# Patient Record
Sex: Female | Born: 1960 | Race: Black or African American | Hispanic: No | Marital: Single | Smoking: Never smoker
Health system: Southern US, Community
[De-identification: ages and names within clinical notes are randomized; demographics above are authoritative.]

## PROBLEM LIST (undated history)

## (undated) DIAGNOSIS — I639 Cerebral infarction, unspecified: Secondary | ICD-10-CM

## (undated) DIAGNOSIS — M1990 Unspecified osteoarthritis, unspecified site: Secondary | ICD-10-CM

## (undated) DIAGNOSIS — I10 Essential (primary) hypertension: Secondary | ICD-10-CM

## (undated) DIAGNOSIS — N183 Chronic kidney disease, stage 3 unspecified: Secondary | ICD-10-CM

---

## 1998-04-13 ENCOUNTER — Encounter: Admission: RE | Admit: 1998-04-13 | Discharge: 1998-07-12 | Payer: Self-pay

## 1998-05-24 ENCOUNTER — Encounter: Admission: RE | Admit: 1998-05-24 | Discharge: 1998-08-22 | Payer: Self-pay

## 2004-08-10 ENCOUNTER — Inpatient Hospital Stay (HOSPITAL_COMMUNITY): Admission: AD | Admit: 2004-08-10 | Discharge: 2004-08-14 | Payer: Self-pay | Admitting: Obstetrics & Gynecology

## 2004-08-24 ENCOUNTER — Inpatient Hospital Stay (HOSPITAL_COMMUNITY): Admission: AD | Admit: 2004-08-24 | Discharge: 2004-08-28 | Payer: Self-pay | Admitting: Obstetrics & Gynecology

## 2004-09-05 ENCOUNTER — Encounter (INDEPENDENT_AMBULATORY_CARE_PROVIDER_SITE_OTHER): Payer: Self-pay | Admitting: *Deleted

## 2004-09-05 ENCOUNTER — Inpatient Hospital Stay (HOSPITAL_COMMUNITY): Admission: AD | Admit: 2004-09-05 | Discharge: 2004-09-08 | Payer: Self-pay | Admitting: Obstetrics & Gynecology

## 2004-10-06 ENCOUNTER — Observation Stay (HOSPITAL_COMMUNITY): Admission: AD | Admit: 2004-10-06 | Discharge: 2004-10-07 | Payer: Self-pay | Admitting: Obstetrics & Gynecology

## 2006-12-17 ENCOUNTER — Emergency Department (HOSPITAL_COMMUNITY): Admission: EM | Admit: 2006-12-17 | Discharge: 2006-12-17 | Payer: Self-pay | Admitting: Emergency Medicine

## 2008-03-10 ENCOUNTER — Ambulatory Visit: Payer: Self-pay | Admitting: Cardiology

## 2008-03-10 ENCOUNTER — Inpatient Hospital Stay (HOSPITAL_COMMUNITY): Admission: EM | Admit: 2008-03-10 | Discharge: 2008-03-11 | Payer: Self-pay | Admitting: Emergency Medicine

## 2008-03-15 ENCOUNTER — Ambulatory Visit: Payer: Self-pay

## 2010-10-09 ENCOUNTER — Emergency Department (HOSPITAL_COMMUNITY)
Admission: EM | Admit: 2010-10-09 | Discharge: 2010-10-09 | Payer: Self-pay | Source: Home / Self Care | Admitting: Emergency Medicine

## 2010-10-09 LAB — URINALYSIS, ROUTINE W REFLEX MICROSCOPIC
Protein, ur: NEGATIVE mg/dL
Specific Gravity, Urine: 1.014 (ref 1.005–1.030)
Urine Glucose, Fasting: NEGATIVE mg/dL

## 2010-10-09 LAB — URINE MICROSCOPIC-ADD ON

## 2010-10-09 LAB — WET PREP, GENITAL

## 2010-10-09 LAB — POCT I-STAT, CHEM 8
BUN: 7 mg/dL (ref 6–23)
Chloride: 103 mEq/L (ref 96–112)
Creatinine, Ser: 0.8 mg/dL (ref 0.4–1.2)
Glucose, Bld: 99 mg/dL (ref 70–99)
Hemoglobin: 12.9 g/dL (ref 12.0–15.0)
Potassium: 3.1 mEq/L — ABNORMAL LOW (ref 3.5–5.1)
Sodium: 139 mEq/L (ref 135–145)

## 2010-10-10 LAB — GC/CHLAMYDIA PROBE AMP, GENITAL: GC Probe Amp, Genital: NEGATIVE

## 2011-01-23 NOTE — H&P (Signed)
NAME:  [name], [name]           ACCOUNT NO.:  [id number]   MEDICAL RECORD NO.:  [id number]          PATIENT TYPE:  INP   LOCATION:  1828                         FACILITY:  [facility]   PHYSICIAN:  Della Goo, M.D. DATE OF BIRTH:  07-23-1961   DATE OF ADMISSION:  03/10/2008  DATE OF DISCHARGE:                              HISTORY & PHYSICAL   PRIMARY CARE PHYSICIAN:  Unassigned.   CHIEF COMPLAINT:  Chest pain.   HISTORY OF PRESENT ILLNESS:  This is a 50 year old female presenting to  the emergency department after being seen at the urgent care center for  substernal area chest pain that she reports has been intermittent over  the past 3 days.  She describes the pain as being a tightness, heaviness  in the chest that radiates from the substernal area into the left arm  and into the back.  She reports having shortness of breath and  lightheadedness associated with these episodes.  Each episode lasts  approximately 15 to 20 minutes.  She reports the pain at the worst has  been a 10/10, and the pain actually was unrelieved until she had  administration of one sublingual nitroglycerin tablet, and she was  referred from the urgent care center afterward to the emergency  department for further evaluation.  The patient reports having similar  episodes in the past; however, they have not lasted as long.   PAST MEDICAL HISTORY:  None.   PAST SURGICAL HISTORY:  History of a bilateral tubal ligation, C-section  and a cholecystectomy.   MEDICATIONS AT THIS TIME:  None.   ALLERGIES:  STADOL, WHICH CAUSED HALLUCINATIONS.   SOCIAL HISTORY:  The patient is a nonsmoker, nondrinker, and she denies  any illicit drug usage.   FAMILY HISTORY:  Positive for coronary artery disease and hypertension  in her father, negative for diabetes, and positive for cancer in her  mother, who had lung cancer and was a smoker.   REVIEW OF SYSTEMS:  Pertinents are mentioned above.   PHYSICAL EXAMINATION  FINDINGS:  GENERAL:  This is a 50 year old morbidly  obese female in no visible discomfort or acute distress currently.  VITAL SIGNS:  Temperature 100.0, blood pressure initially 173/103, heart  rate 98, respirations 16, O2 saturation 100%.  HEENT:  Normocephalic, atraumatic.  Pupils equally round, reactive to  light.  Extraocular muscles are intact.  Funduscopic benign.  Oropharynx  is clear.  NECK:  Supple.  Full range of motion.  No thyromegaly, adenopathy,  jugular venous distention.  CARDIOVASCULAR:  Regular rate and rhythm.  No murmurs, gallops or rubs.  LUNGS:  Clear to auscultation bilaterally.  ABDOMEN:  Positive bowel sounds.  Soft, nontender, nondistended.  EXTREMITIES:  Without cyanosis, clubbing or edema.  NEUROLOGIC:  Examination nonfocal, and the patient is alert and  oriented.   LABORATORY STUDIES:  White blood cell count 10.0, hemoglobin 11.1,  hematocrit 33.0, MCV 84.5.  Platelets listed as being within normal  limits, but there is no number listed.  Neutrophils 66%, lymphocytes  25%.  Sodium 139, potassium 3.5, chloride 108, bicarb 23, BUN 9,  creatinine 0.9, glucose 71.  Cardiac  enzymes with a myoglobin of 75.3,  CK-MB 1.5 and troponin 0.05.  Lipase 32 and amylase 98.  Chest x-ray  findings reveal no acute cardiopulmonary disease process.  An EKG  reveals a normal sinus rhythm without acute ST-segment changes.   ASSESSMENT:  A 50 year old female being admitted with:  1. Chest pain.  2. Hypertension/elevated blood pressure.  3. Mild anemia.   PLAN:  The patient will be admitted to a telemetry area for cardiac  monitoring.  Cardiac enzymes will be performed.  The patient will be  placed on nitro paste, oxygen and beta blocker therapy.  Aspirin therapy  has also been ordered.  DVT and GI prophylaxis will be ordered for now.  Further workup will ensue pending the patient's condition and results of  her studies.      Della Goo, M.D.  Electronically  Signed     HJ/MEDQ  D:  03/10/2008  T:  03/10/2008  Job:  [id number]

## 2011-01-23 NOTE — Consult Note (Signed)
NAME:  [name], JACQUELINE           ACCOUNT NO.:  [id number]   MEDICAL RECORD NO.:  [id number]           PATIENT TYPE:   LOCATION:                                 FACILITY:   PHYSICIAN:  Rollene Rotunda, MD, FACCDATE OF BIRTH:  07/18/1961   DATE OF CONSULTATION:  03/11/2008  DATE OF DISCHARGE:                                 CONSULTATION   PRIMARY CARE PHYSICIAN:  Patrica Duel, MD   NEW PRIMARY CARDIOLOGIST:  Rollene Rotunda, MD, [facility]   CHIEF COMPLAINT:  Chest pain.   HISTORY OF PRESENT ILLNESS:  Ms. Guirguis is a 50 year old female with no  previous history of coronary artery disease.  She has a several-month  history of fatigue and dyspnea on exertion, but no chest pain until 3  days ago.  She describes the chest pain as sharp and states it goes from  the left side of her chest down her left arm and around to her back.  She has had occasional right-sided pain also.  She has had 3-4 episodes  daily for the last 3 days, each lasting about 15 minutes.  There is no  aggravating or alleviating factors.  There is no clear association with  exertion, rest, or supine position.  She has also had bilateral hand  cramping and some numbness as well as cramping in her big toes  bilaterally and right arm.  The lower extremity cramps are relieved by  walking.  Yesterday, she had more frequent episodes and the intensity  reached to 9/10.  She also complained of a headache.  She went to the  emergency room, where her blood pressure was 178/108.  She was admitted.  Cardiac enzymes were cycled and are negative so far.  Her EKG was not  acute.  Cardiology was asked to evaluate her.  In the emergency room,  she received sublingual nitroglycerin, which helped both her blood  pressure and her chest pain.  She has had no chest pain today, but feels  sluggish, which is the way she has felt for the last few months.  She is  otherwise resting comfortably.   PAST MEDICAL HISTORY:  1. She denies any history  of diabetes, hypertension, hyperlipidemia,      or tobacco abuse.  2. Family history of coronary artery disease.  3. Pregnancy-induced hypertension in 2005.  4. Possible osteoarthritis.  5. Gestational diabetes.  6. Gestational thrombocytopenia.   SURGICAL HISTORY:  She is status post C-section x2 as well as  cholecystectomy and bilateral tubal ligation.   ALLERGIES:  She is intolerant or allergic to STADOL with a rash.   MEDICATIONS PRIOR TO ADMISSION:  None.   CURRENT MEDICATIONS:  1. Aspirin 325 mg daily.  2. DVT Lovenox.  3. Metoprolol 12.5 mg b.i.d.  4. Nitroglycerin paste 1/4 inch q.6 h.  5. Protonix 40 mg daily.  6. Potassium 20 mEq x4.  7. Senokot nightly.   SOCIAL HISTORY:  She lives in [place] and her son lives with her.  She works at the [employer].  She has no history of alcohol,  tobacco, or drug abuse.   FAMILY  HISTORY:  Her mother died at age 109 of cancer with no heart  disease, and her father died at age 55 with a history of coronary artery  disease and heart failure, but no siblings have heart disease.   REVIEW OF SYSTEMS:  She feels that her weight has increased about 15  pounds in the last 6 months.  The chest pain is described above.  She  had shortness of breath with it and also complains of increased dyspnea  on exertion.  There has been no coughing or wheezing.  There has been no  edema, no palpitations, no presyncope, or syncope.  She has had p.m. leg  cramps, and because of this, sleeps poorly.  She has had daytime  fatigue.  She has arthralgias mainly in her knees.  She has had  increased reflux symptoms recently.  Full 14-point review of systems is  otherwise negative.   PHYSICAL EXAMINATION:  VITAL SIGNS:  Temperature is 99.7, blood pressure  141/74, pulse 90, respiratory rate 18, and O2 saturation 100% on room  air.  GENERAL:  Well-developed, well-nourished, African American female,  in no acute distress.  HEENT:  Normal.  NECK:   There is no lymphadenopathy, thyromegaly, bruit, or JVD noted.  CV:  Heart is regular in rate and rhythm with an S1and S2 and a soft  systolic murmur is fairly noticeable.  Distal pulses are intact in all 4  extremities.  LUNGS:  Clear to auscultation bilaterally with no wheezing or crackles.  SKIN:  No rashes or lesions are noted.  ABDOMEN:  Soft and nontender with active bowel sounds and no  hepatosplenomegaly by palpation.  EXTREMITIES:  There is no edema noted and no cyanosis or clubbing is  noted.  MUSCULOSKELETAL:  No joint deformity or effusions and no spine or CVA  tenderness.  NEURO:  Intact, as she is alert and oriented.  Cranial nerves II through  XII grossly intact.   Chest x-ray, no acute disease.   EKG, sinus rhythm, rate 92, with no acute changes and repeat EKG is  within normal limits.   LABORATORY VALUES:  Hemoglobin 11.2, hematocrit 33, WBCs 10.0, platelets  are clumped, but count appears adequate.  Sodium 139, potassium 3.5,  chloride 108, BUN 9, creatinine 0.9, and glucose 71.  TSH 1.285.  CK-MB  and troponin I negative for MI.  Amylase and lipase within normal  limits.  Point-of-care markers also negative.  Total cholesterol 118,  triglycerides 51, HDL 51, and LDL 57.   IMPRESSION:  Ms. Shugrue was seen today by Dr. Antoine Poche.  Her chest pain  is atypical, but also has some typical features.  There is no objective  evidence of ischemia or myocardial infarction.  Because her chest pain  is largely  atypical, the plan will be for an outpatient cardiac stress test.  She  is scheduled for an exercise Myoview in our office on Monday.  It is  appropriate to continue her on a low-dose beta blocker, which we need to  hold on the day of the stress test, and consideration can be given to a  low-dose diuretic, if more optimal blood pressure control is needed.      Theodore Demark, PA-C      Rollene Rotunda, MD, [facility]  Electronically Signed    RB/MEDQ  D:   03/11/2008  T:  03/12/2008  Job:  [id number]

## 2011-01-23 NOTE — Discharge Summary (Signed)
NAME:  [name], [name]           ACCOUNT NO.:  [id number]   MEDICAL RECORD NO.:  [id number]          PATIENT TYPE:  INP   LOCATION:  3733                         FACILITY:  [facility]   PHYSICIAN:  Elliot Cousin, M.D.    DATE OF BIRTH:  September 26, 1960   DATE OF ADMISSION:  03/10/2008  DATE OF DISCHARGE:  03/11/2008                               DISCHARGE SUMMARY   DISCHARGE DIAGNOSES:  1. Chest pain, myocardial infarction ruled out.  2. Hypertension.   DISCHARGE MEDICATIONS:  1. Toprol XL 25 mg daily.  2. Aspirin 81 mg daily.   DISCHARGE DISPOSITION:  The patient is being discharged to home in  improved and stable condition.  She was advised to follow up with  [facility] cardiology on Monday, March 15, 2008 at 8:45 a.m. for a treadmill  stress test.  She will follow up with Dr. Antoine Poche on April 12, 2008 at  3:45 p.m.   CONSULTATIONS:  Rollene Rotunda, MD.   PROCEDURE PERFORMED:  Chest x-ray.  The results revealed no acute  cardiopulmonary disease.   HISTORY OF PRESENT ILLNESS:  The patient is a 50 year old woman with no  significant past medical history who presented to the emergency  department on March 10, 2008 with a chief complaint of substernal chest  pain that radiated to the left arm and with associated shortness of  breath and lightheadedness. When she was evaluated in the emergency  department, she was given 1 sublingual nitroglycerin, which apparently  relieved her pain. Her blood pressure was elevated at 173/103.  Her  heart rate was 98.  Her EKG revealed normal sinus rhythm with a heart  rate of 92 beats per minute and no acute abnormalities.  The patient was  admitted for further evaluation and management.   For additional details, please see the dictated history and physical.   HOSPITAL COURSE:  CHEST PAIN AND HYPERTENSION.  The patient was started  on Nitropaste empirically at 1 inch every 6 hours.  For blood pressure  control, intravenous Lopressor was added IV at 5 mg  every 6 hours  (p.r.n.).  Aspirin therapy was started at 325 mg daily.  Dilaudid was  added symptomatically for pain as needed.  For further evaluation,  cardiac enzymes, TSH, and a fasting lipid profile were ordered.  The  patient's cardiac enzymes were completely normal.  Her fasting lipid  profile revealed a total cholesterol of 118, triglycerides of 51, HDL  cholesterol of 51, and LDL cholesterol of 57.  Her TSH was within normal  limits at 1.285.   Over the course of the 24-hour period, the patient's chest pain  completely resolved.  A follow-up EKG revealed normal sinus rhythm with  nonspecific T-wave abnormalities and a heart rate of 82 beats per  minute.  Subsequently, Lopressor was started orally at 12.5 mg b.i.d.  The Nitropaste was tapered off.  Cardiologist Dr. Rollene Rotunda was  consulted for further risk stratification and evaluation.  Per his  assessment, the patient could be evaluated further in the outpatient  setting given that she was now chest pain free and that her cardiac  enzymes were  completely normal.  Therefore, the patient has been set up  for an outpatient stress test on Monday March 15, 2008.  Prior to hospital  discharge, the patient was given instructions on the time and place of  the stress test.  Upon discharge, the Lopressor  was discontinued and the patient was subsequently started on Toprol XL  25 mg daily.  The patient was advised to continue aspirin at 81 mg  daily.  She is to follow up with Dr. Antoine Poche on April 12, 2008.  The  patient voiced understanding.      Elliot Cousin, M.D.  Electronically Signed     DF/MEDQ  D:  03/11/2008  T:  03/11/2008  Job:  [id number]   cc:   Rollene Rotunda, MD, [facility]

## 2011-01-26 NOTE — Discharge Summary (Signed)
NAME:  [name], [name]           ACCOUNT NO.:  [id number]   MEDICAL RECORD NO.:  [id number]          PATIENT TYPE:  INP   LOCATION:  9119                          [facility]  [facility]   PHYSICIAN:  Charles A. Clearance Coots, M.D.DATE OF BIRTH:  1961-04-02   DATE OF ADMISSION:  09/05/2004  DATE OF DISCHARGE:                                 DISCHARGE SUMMARY   ADMITTING DIAGNOSES:  1.  Thirty-seven weeks gestation.  2.  Early labor.  3.  History of previous cesarean section.  4.  Declines trial of labor.  5.  Desires permanent sterilization.  6.  History of pregnancy-induced hypertension.  7.  History of gestational thrombocytopenia.  Discharge Diagnosis  1.  Status post repeat low transverse cesarean section and bilateral partial      salpingectomy on September 05, 2004.  Delivered a viable female by cesarean      section at 42; Apgars of 9 at one minute and 9 at five minutes; weight      of 3150 g, length of 48.5 cm.  Mother and infant discharged home in good      condition.   REASON FOR ADMISSION:  A 50 year old black female G3 P2-0-0-2; estimated  date of confinement of September 24, 2004; presented at [redacted] weeks gestation in  early labor.  The patient has a history of previous cesarean section and had  refused a trial of labor.  She also desired tubal sterilization.  Her  obstetrical history was significant for pregnancy-induced hypertension for  which she was admitted and placed on bedrest during this pregnancy.  She  also has a history of gestational thrombocytopenia which was stable.  She  had a recent steroid taper for her gestational thrombocytopenia.   PAST MEDICAL HISTORY:  Surgery:  Cholecystectomy.  Illnesses:  Arthritis.   MEDICATIONS:  Prenatal vitamins, steroids.   ALLERGIES:  No known drug allergies.   SOCIAL HISTORY:  Negative for tobacco, alcohol, or recreational drug use.   PHYSICAL EXAMINATION:  GENERAL:  Well-nourished, well-developed black female  in no acute  distress.  VITAL SIGNS:  Temperature 98.5, pulse 77, respiratory rate 18, blood  pressure 135/77.  LUNGS:  Clear to auscultation bilaterally.  HEART:  Regular rate and rhythm.  ABDOMEN:  Gravid, nontender.  PELVIC:  Cervix 1 cm dilated per R.N. exam.   ADMITTING LABORATORY VALUES:  Hemoglobin 12.3; hematocrit 35.5; white blood  cell count 11,300; platelets 91,000.  RPR was nonreactive.   HOSPITAL COURSE:  The patient underwent a repeat low transverse cesarean  section and bilateral partial salpingectomy on September 05, 2004.  There  were no intraoperative complications.  Postoperative course was  uncomplicated and the patient discharged home on postoperative day #3 in  good condition.   DISCHARGE LABORATORY VALUES:  Hemoglobin 10.5; hematocrit 30.3; white blood  cell count 11,600; platelets 76,000.   DISCHARGE DISPOSITION:  1.  Medications:  Percocet was prescribed for pain.  2.  The patient is to not take any ibuprofen products because of her low      platelet count.  3.  Routine written instructions were given for obstetrical discharge after  cesarean section.  4.  The patient is to follow up in the office in 4 days for removal of her      staples.     Char   CAH/MEDQ  D:  09/08/2004  T:  09/08/2004  Job:  [id number]

## 2011-01-26 NOTE — Discharge Summary (Signed)
NAME:  [name], [name]           ACCOUNT NO.:  [id number]   MEDICAL RECORD NO.:  [id number]          PATIENT TYPE:  INP   LOCATION:  9156                          FACILITY:  [facility]   PHYSICIAN:  Roseanna Rainbow, M.D.DATE OF BIRTH:  1960/11/20   DATE OF ADMISSION:  08/24/2004  DATE OF DISCHARGE:  08/28/2004                                 DISCHARGE SUMMARY   CHIEF COMPLAINT:  The patient is a 50 year old para 2 with estimated date of  confinement September 26, 2004 with an intrauterine pregnancy of 35 weeks 2  days with pregnancy induced hypertension.  Please see the dictated history  and physical for further details.   HOSPITAL COURSE:  The patient was admitted.  Her blood pressures remained  stable.  A 24 hour urine demonstrated 215 mg of proteinuria and a creatinine  clearance of 195.  The fetal heart tracings remained reassuring and she was  discharged to home on hospital day #4.   DISCHARGE DIAGNOSIS:  Pregnancy induced hypertension, mild.  Intrauterine  pregnancy at 35+ weeks.   CONDITION ON DISCHARGE:  Stable.   DIET:  Regular.   ACTIVITY:  Modified bed rest.   MEDICATIONS:  Resume home medications.   DISPOSITION:  The patient was to follow up in the office in several days.      LAJ/MEDQ  D:  10/02/2004  T:  10/02/2004  Job:  [id number]

## 2011-01-26 NOTE — Discharge Summary (Signed)
NAME:  [name], [name]           ACCOUNT NO.:  [id number]   MEDICAL RECORD NO.:  [id number]          PATIENT TYPE:  INP   LOCATION:  9151                          [facility]   PHYSICIAN:  Roseanna Rainbow, M.D.DATE OF BIRTH:  03/08/1961   DATE OF ADMISSION:  08/10/2004  DATE OF DISCHARGE:  08/14/2004                                 DISCHARGE SUMMARY   CHIEF COMPLAINT:  The patient is a 50 year old para 1 with estimated date of  confinement of September 26, 2004 with an intrauterine pregnancy at 34 weeks  with elevated blood pressures and thrombocytopenia.  Please see the dictated  History and Physical for further details.   HOSPITAL COURSE:  The patient was admitted.  Her blood pressures remained  stable.  A 24-hour urine and laboratory work was not consistent with severe  pregnancy-induced hypertension or HELLP syndrome.  The fetal heart tracing  remained reassuring.  She was discharged to home on hospital day #4.   DISCHARGE DIAGNOSES:  1.  Intrauterine pregnancy at 34+ weeks.  2.  Mild pregnancy-induced hypertension.  3.  Gestational thrombocytopenia.   CONDITION:  Stable.   DIET:  Regular.   ACTIVITY:  Modified bed rest.   MEDICATIONS:  Resume home medications.   DISPOSITION:  The patient was to follow up in the office in several days.      LAJ/MEDQ  D:  10/02/2004  T:  10/02/2004  Job:  [id number]

## 2011-01-26 NOTE — H&P (Signed)
NAME:  [name], [name]           ACCOUNT NO.:  [id number]   MEDICAL RECORD NO.:  [id number]          PATIENT TYPE:  INP   LOCATION:  9156                          [facility]   PHYSICIAN:  Roseanna Rainbow, M.D.DATE OF BIRTH:  1961/03/27   DATE OF ADMISSION:  DATE OF DISCHARGE:                                HISTORY & PHYSICAL   CHIEF COMPLAINT:  The patient is a 50 year old, para 2, with an estimated  date of confinement of September 26, 2004, with an intrauterine pregnancy at  35 weeks, 2 days, with pregnancy-induced hypertension, rule out severe  pregnancy-induced hypertension.   HISTORY OF PRESENT ILLNESS:  The patient has been followed at [facility]  for gestational thrombocytopenia.  She has recently completed a steroid  taper.  Recent platelet count was 121,000 with large platelets present.  Blood pressure in the office today was 159/87.  She denies any concomitant  symptoms.  Urine dip was negative for protein.   ANTEPARTUM COURSE PROBLEMS:  (See above.)  1.  Advanced maternal age.  2.  History of a previous cesarean delivery.  3.  History of gestational diabetes and a macrosomic infant.  4.  Pregnancy-induced hypertension.   PRENATAL SCREEN:  Sickle cell negative.  Hepatitis B surface antigen  negative.  HIV nonreactive.  RPR nonreactive.  Rubella immune.  AVO and Rh,  A positive, antibody screen negative.  Hemoglobin 11.1, hematocrit 34.6,  platelet count 112,000.  Urine culture and sensitivity revealed no urine  pathogens.  One-hour GCT 81.   PAST OBSTETRICAL HISTORY:  1.  In 1984, she was delivered of a female, 6 pounds, 5 ounces, at 41 weeks,      18 hours of labor, spontaneous vaginal delivery, no complications.  2.  In March of 1986, she was delivered of a female, 9 pounds, 8 ounces, at      41 weeks, 20 hours of labor, by cesarean delivery, breech presentation.      Also complicated by gestational diabetes, diet controlled, and pregnancy-  induced hypertension at term.   PAST GYNECOLOGIC HISTORY:  Noncontributory.   PAST MEDICAL HISTORY:  Arthritis.   PAST SURGICAL HISTORY:  1.  See above.  2.  She is also status post cholecystectomy.   FAMILY HISTORY:  Remarkable for heart disease, chronic hypertension, COPD,  and breast cancer.   SOCIAL HISTORY:  She denies any tobacco, ethanol, or substance abuse.   ALLERGIES:  No known drug allergies.   MEDICATIONS:  Prenatal vitamins.   PHYSICAL EXAMINATION:  VITAL SIGNS:  Temperature 98.2, pulse 93, blood  pressure 159/87.  GENERAL:  Well-developed, well-nourished, in no apparent distress.  LUNGS:  Clear to auscultation bilaterally.  ABDOMEN:  Gravid.  PELVIC EXAM:  Deferred.  EXTREMITIES:  Trace to 1+ lower extremity edema.   Non-stress test reactive.   ASSESSMENT:  1.  Intrauterine pregnancy at 62 and 2/7ths weeks with pregnancy-induced      hypertension, rule out severe.  2.  History of gestational thrombocytopenia.   PLAN:  1.  Admission.  2.  Daily weights.  3.  Serial laboratory testing.  4.  Repeat 24-hour  urine for protein and creatinine.  5.  Daily non-stress tests.     [name]  D:  08/24/2004  T:  08/24/2004  Job:  [id number]

## 2011-01-26 NOTE — H&P (Signed)
NAME:  [name], [name]           ACCOUNT NO.:  [id number]   MEDICAL RECORD NO.:  [id number]          PATIENT TYPE:  INP   LOCATION:  9151                          FACILITY:  [facility]   PHYSICIAN:  Roseanna Rainbow, M.D.DATE OF BIRTH:  1960-09-20   DATE OF ADMISSION:  08/10/2004  DATE OF DISCHARGE:                                HISTORY & PHYSICAL   CHIEF COMPLAINT:  The patient is a 50 year old para 2 with an estimated date  of confinement of September 26, 2004 with an intrauterine pregnancy at 34  weeks.  She had elevated blood pressures and thrombocytopenia.   HISTORY OF PRESENT ILLNESS:  See above.  The patient has been followed at  [facility] for gestational thrombocytopenia.  She has recently been  given a steroid taper.  Blood pressure in the office today was 156/79.  Urine dip was negative for protein.  The patient denied any concomitant  symptoms.   ANTEPARTUM COURSE:  Problems with see above.  1.  Advanced maternal age.  2.  History of a previous cesarean delivery.  3.  History of gestational diabetes and macrosomic infant.  4.  Pregnancy-induced hypertension.   PRENATAL SCREENS:  Sickle cell negative.  Hepatitis B surface-antigen  negative.  HIV nonreactive.  RPR nonreactive.  Rubella immuned.  ABO and Rh,  a positive, antibody screen negative.  Hemoglobin 11.1, hematocrit 34.6,  platelet count 112,000.  Most recent platelet count on July 27, 2004 was  97,000 with large platelets present.  Urine culture and sensitivity show no  uropathogens.  Recent ultrasound several days prior to presentation with the  report pending.  One hour GTC 81.   OBSTETRICAL HISTORY:  1.  In 1984, she has delivered a female 6 pound 5 ounces, 41 weeks, 18 hours      of labor.  Spontaneous vaginal delivery.  No complications.  2.  In March of 1986, she has delivered a female 9 pound 8 ounces, 41 weeks,      20 hours in labor by cesarean section, breech presentation also  complicated by gestational diabetes, diet controlled.  Complicated as      well by pregnancy-induced hypertension at term.   PAST MEDICAL HISTORY:  Arthritis.   PAST SURGICAL HISTORY:  See above.  Cholecystectomy.   FAMILY HISTORY:  Remarkable for heart disease, chronic hypertension, COPD,  breast cancer.   SOCIAL HISTORY:  She denies any tobacco, alcohol, or substance abuse.   ALLERGIES:  No known drug allergies.   MEDICATIONS:  Prenatal vitamins, prednisone.   PHYSICAL EXAMINATION:  VITAL SIGNS:  Temperature 98.3, pulse 81, blood  pressure 156/79.  GENERAL:  No apparent distress.  ABDOMEN:  Gravid.  Nontender.  PELVIC:  Deferred.  EXTREMITIES:  No edema.   ASSESSMENT:  1.  Intrauterine pregnancy at 34 weeks.  Rule out pregnancy-induced      hypertension.  2.  History of gestational thrombocytopenia.   PLAN:  Admission.  Daily weights.  Serial laboratory evaluations.  A 24-hour  urine for protein and creatinine.  Heightened fetal surveillance.     [name]  D:  08/10/2004  T:  08/10/2004  Job:  [id number]

## 2011-01-26 NOTE — Op Note (Signed)
NAME:  Portlock, JACQUELINE           ACCOUNT NO.:  [id number]   MEDICAL RECORD NO.:  [id number]          PATIENT TYPE:  MAT   LOCATION:  MATC                          FACILITY:  [facility]   PHYSICIAN:  Roseanna Rainbow, M.D.DATE OF BIRTH:  11/03/1960   DATE OF PROCEDURE:  09/05/2004  DATE OF DISCHARGE:                                 OPERATIVE REPORT   PREOPERATIVE DIAGNOSES:  1.  Intrauterine pregnancy at 36 weeks, in early labor.  2.  History of a previous cesarean delivery, declines trial of labor.  3.  Desires a sterilization procedure.   POSTOPERATIVE DIAGNOSES:  1.  Intrauterine pregnancy at 36 weeks, in early labor.  2.  History of a previous cesarean delivery, declines trial of labor.  3.  Desires a sterilization procedure.   PROCEDURES:  1.  Repeat cesarean delivery.  2.  Modified Pomeroy bilateral tubal ligation.   SURGEON:  Roseanna Rainbow, M.D.   ANESTHESIA:  Spinal.   ESTIMATED BLOOD LOSS:  800 mL.   COMPLICATIONS:  None.   PROCEDURE:  The patient was taken to the OR.  A spinal anesthetic was  placed.  She was then placed in the dorsal supine position with a leftward  tilt.  She was prepped and draped in the usual sterile fashion.  The  previous midline scar was then excised with a scalpel and carried down to  the underlying fascia.  The fascia was incised along the length of the  incision.  The parietal peritoneum was tented up and entered sharply.  The  incision was then extended superiorly and inferiorly with good visualization  of the bladder.  The bladder blade was placed.  The vesicouterine peritoneum  was tented up and entered sharply.  This incision was then extended  bilaterally and the bladder flap created sharply.  The bladder blade was  replaced.  The lower uterine segment was incised in a transverse fashion  with a scalpel.  This incision was extended bilaterally with bandage  scissors.  The infant's head was delivered atraumatically.  The  oropharynx  was suctioned with the bulb suction.  The cord was clamped and cut.  The  infant was handed off to the awaiting neonatologists.  Apgars were 9 at one  and five minutes, respectively.  The placenta was removed.  The intrauterine  cavity was evacuated of any remaining amniotic fluid, clots and debris with  a moistened laparotomy sponge.  The uterine incision was then reapproximated  in a running interlocking fashion in layers using 0 Monocryl.  Adequate  hemostasis was noted.  The midisthmic portion of the left fallopian tube was  then grasped.  Two ligatures of 0 plain were then placed and the segment of  tube excised.  The right fallopian tube was manipulated in a similar  fashion.  The paracolic gutters were copiously irrigated.  The parietal  peritoneum and  fascia were reapproximated as a single continuous running layer using 0 PDS.  The skin was reapproximated with staples.  At the close of the procedure,  the instrument and pad counts were said to be correct x2.  Cefazolin 1  g was  given at cord clamp.  The patient was taken to the PACU awake and in stable  condition.     [name]  D:  09/05/2004  T:  09/05/2004  Job:  [id number]

## 2011-06-07 LAB — POCT I-STAT, CHEM 8
Calcium, Ion: 0.99 — ABNORMAL LOW
Creatinine, Ser: 0.9
Glucose, Bld: 71
Hemoglobin: 11.2 — ABNORMAL LOW
TCO2: 23

## 2011-06-07 LAB — LIPID PANEL
Cholesterol: 118
HDL: 51
LDL Cholesterol: 57
Total CHOL/HDL Ratio: 2.3

## 2011-06-07 LAB — DIFFERENTIAL
Basophils Absolute: 0
Lymphocytes Relative: 25
Monocytes Relative: 8

## 2011-06-07 LAB — CBC
Hemoglobin: 11.1 — ABNORMAL LOW
Platelets: ADEQUATE
RDW: 15.8 — ABNORMAL HIGH
WBC: 10

## 2011-06-07 LAB — AMYLASE: Amylase: 98

## 2011-06-07 LAB — POCT CARDIAC MARKERS
Myoglobin, poc: 75.3
Operator id: 272551

## 2011-06-07 LAB — BASIC METABOLIC PANEL
BUN: 11
GFR calc non Af Amer: >60
Glucose, Bld: 85
Potassium: 3.5

## 2011-06-07 LAB — LIPASE, BLOOD: Lipase: 32

## 2011-06-07 LAB — CARDIAC PANEL(CRET KIN+CKTOT+MB+TROPI): Relative Index: 0.7

## 2013-09-25 ENCOUNTER — Emergency Department (HOSPITAL_COMMUNITY)
Admission: EM | Admit: 2013-09-25 | Discharge: 2013-09-25 | Disposition: A | Discharge status: Home / Self Care | Payer: 59 | Source: Home / Self Care | Attending: Family Medicine | Admitting: Family Medicine

## 2013-09-25 ENCOUNTER — Encounter (HOSPITAL_COMMUNITY): Payer: Self-pay | Admitting: Emergency Medicine

## 2013-09-25 DIAGNOSIS — J069 Acute upper respiratory infection, unspecified: Secondary | ICD-10-CM

## 2013-09-25 LAB — POCT RAPID STREP A: STREPTOCOCCUS, GROUP A SCREEN (DIRECT): NEGATIVE

## 2013-09-25 LAB — POCT I-STAT, CHEM 8
BUN: 6 mg/dL (ref 6–23)
CALCIUM ION: 1.15 mmol/L (ref 1.12–1.23)
CREATININE: 0.8 mg/dL (ref 0.50–1.10)
Chloride: 101 mEq/L (ref 96–112)
Glucose, Bld: 100 mg/dL — ABNORMAL HIGH (ref 70–99)
HCT: 35 % — ABNORMAL LOW (ref 36.0–46.0)
HEMOGLOBIN: 11.9 g/dL — AB (ref 12.0–15.0)
Potassium: 3.5 mEq/L — ABNORMAL LOW (ref 3.7–5.3)
SODIUM: 139 meq/L (ref 137–147)
TCO2: 27 mmol/L (ref 0–100)

## 2013-09-25 MED ORDER — PREDNISONE 10 MG PO TABS: 30.0000 mg | ORAL_TABLET | Freq: Every day | ORAL | Status: DC

## 2013-09-25 MED ORDER — LISINOPRIL 10 MG PO TABS: 10.0000 mg | ORAL_TABLET | Freq: Every day | ORAL | Status: DC

## 2013-09-25 MED ORDER — HYDROCODONE-ACETAMINOPHEN 5-325 MG PO TABS: 0.5000 | ORAL_TABLET | Freq: Every evening | ORAL | Status: DC | PRN

## 2013-09-25 MED ORDER — IPRATROPIUM BROMIDE 0.06 % NA SOLN: 2.0000 | Freq: Four times a day (QID) | NASAL | Status: DC

## 2013-09-25 NOTE — Discharge Instructions (Signed)
Thank you for coming in today. STOP: Taking cough and cold medications ibuprofen or Aleve. These are increasing your blood pressure. Start lisinopril for blood pressure control.  Take prednisone daily for 5 days. Use Atrovent nasal spray. Use hydrocodone containing cough medication pills as needed. Use Tylenol for pain control as needed. Call or go to the emergency room if you get worse, have trouble breathing, have chest pains, or palpitations.  Followup with a primary care provider or come back here in 1 week for blood pressure recheck,

## 2013-09-25 NOTE — ED Provider Notes (Signed)
[name] is a 53 y.o. female who presents to Urgent Care today for cough congestion sore throat hoarse voice and nasal discharge. Symptoms are present for about 5 days now. Patient denies any chest pains palpitations trouble breathing significant headache weakness difficulty with vision or syncope. She's tried multiple over-the-counter medications including NSAIDs and Sudafed-type medications recently. These have helped a little. Patient has a past medical history for mild hypertension currently not taking any medication. She has never had blood pressure this high before.   History reviewed. No pertinent past medical history. History  Substance Use Topics  . Smoking status: Not on file  . Smokeless tobacco: Not on file  . Alcohol Use: No   ROS as above Medications: No current facility-administered medications for this encounter.   Current Outpatient Prescriptions  Medication Sig Dispense Refill  . HYDROcodone-acetaminophen (NORCO/VICODIN) 5-325 MG per tablet Take 0.5 tablets by mouth at bedtime as needed (cough).  10 tablet  0  . ipratropium (ATROVENT) 0.06 % nasal spray Place 2 sprays into both nostrils 4 (four) times daily.  15 mL  1  . lisinopril (PRINIVIL,ZESTRIL) 10 MG tablet Take 1 tablet (10 mg total) by mouth daily.  30 tablet  0  . predniSONE (DELTASONE) 10 MG tablet Take 3 tablets (30 mg total) by mouth daily.  15 tablet  0    Exam:  BP 232/112  Pulse 89  Temp(Src) 98.4 F (36.9 C) (Oral)  Resp 20  SpO2 100%  LMP 09/15/2013 Gen: Well NAD HEENT: EOMI,  MMM posterior pharynx with cobblestoning. Tympanic membranes are normal appearing bilaterally. Tender bilateral anterior cervical lymphadenopathy right worse than left. Lungs: Normal work of breathing. CTABL Heart: RRR no MRG Abd: NABS, Soft. NT, ND Exts: Brisk capillary refill, warm and well perfused.   Results for orders placed during the hospital encounter of 09/25/13 (from the past 24 hour(s))  POCT RAPID  STREP A ([place])     Status: None   Collection Time    09/25/13 11:31 AM      Result Value Range   Streptococcus, Group A Screen (Direct) NEGATIVE  NEGATIVE  POCT I-STAT, CHEM 8     Status: Abnormal   Collection Time    09/25/13 11:34 AM      Result Value Range   Sodium 139  137 - 147 mEq/L   Potassium 3.5 (*) 3.7 - 5.3 mEq/L   Chloride 101  96 - 112 mEq/L   BUN 6  6 - 23 mg/dL   Creatinine, Ser 0.80  0.50 - 1.10 mg/dL   Glucose, Bld 100 (*) 70 - 99 mg/dL   Calcium, Ion 1.15  1.12 - 1.23 mmol/L   TCO2 27  0 - 100 mmol/L   Hemoglobin 11.9 (*) 12.0 - 15.0 g/dL   HCT 35.0 (*) 36.0 - 46.0 %   No results found.  Assessment and Plan: 53 y.o. female with  1) viral pharyngitis/URI. Plan to treat with low-dose prednisone, Atrovent nasal spray, and hydrocodone his cough medication. Followup with primary care provider 2) elevated blood pressure: Hypertensive range. Patient currently is symptomatic. Will start lisinopril and return in one week for blood pressure recheck. Recommend followup with primary care provider.  Discussed warning signs or symptoms. Please see discharge instructions. Patient expresses understanding.    Gregor Hams, MD 09/25/13 [phone]

## 2013-09-25 NOTE — ED Notes (Signed)
C/o sore throat due to cough with mucous, headache, and congestion. Stated that she has taking OTC medications with no relief. B/p is 232/112. Denies chest pain or any other symptoms. Written by: Lenore Manner, SMA

## 2013-09-27 LAB — CULTURE, GROUP A STREP

## 2015-09-19 ENCOUNTER — Encounter (HOSPITAL_COMMUNITY): Payer: Self-pay | Admitting: Family Medicine

## 2015-09-19 ENCOUNTER — Observation Stay (HOSPITAL_COMMUNITY)
Admission: EM | Admit: 2015-09-19 | Discharge: 2015-09-20 | Disposition: A | Discharge status: Home / Self Care | Payer: 59 | Attending: Internal Medicine | Admitting: Internal Medicine

## 2015-09-19 ENCOUNTER — Emergency Department (HOSPITAL_COMMUNITY): Payer: 59

## 2015-09-19 DIAGNOSIS — I161 Hypertensive emergency: Principal | ICD-10-CM | POA: Insufficient documentation

## 2015-09-19 DIAGNOSIS — E876 Hypokalemia: Secondary | ICD-10-CM | POA: Diagnosis not present

## 2015-09-19 DIAGNOSIS — R7303 Prediabetes: Secondary | ICD-10-CM | POA: Insufficient documentation

## 2015-09-19 DIAGNOSIS — Z9114 Patient's other noncompliance with medication regimen: Secondary | ICD-10-CM | POA: Diagnosis not present

## 2015-09-19 DIAGNOSIS — R209 Unspecified disturbances of skin sensation: Secondary | ICD-10-CM | POA: Insufficient documentation

## 2015-09-19 DIAGNOSIS — E875 Hyperkalemia: Secondary | ICD-10-CM | POA: Diagnosis not present

## 2015-09-19 DIAGNOSIS — R0789 Other chest pain: Secondary | ICD-10-CM | POA: Diagnosis not present

## 2015-09-19 DIAGNOSIS — N179 Acute kidney failure, unspecified: Secondary | ICD-10-CM | POA: Insufficient documentation

## 2015-09-19 DIAGNOSIS — M170 Bilateral primary osteoarthritis of knee: Secondary | ICD-10-CM | POA: Insufficient documentation

## 2015-09-19 DIAGNOSIS — I517 Cardiomegaly: Secondary | ICD-10-CM | POA: Insufficient documentation

## 2015-09-19 DIAGNOSIS — I10 Essential (primary) hypertension: Secondary | ICD-10-CM | POA: Diagnosis not present

## 2015-09-19 DIAGNOSIS — M25569 Pain in unspecified knee: Secondary | ICD-10-CM

## 2015-09-19 DIAGNOSIS — R52 Pain, unspecified: Secondary | ICD-10-CM

## 2015-09-19 DIAGNOSIS — D509 Iron deficiency anemia, unspecified: Secondary | ICD-10-CM | POA: Insufficient documentation

## 2015-09-19 DIAGNOSIS — M069 Rheumatoid arthritis, unspecified: Secondary | ICD-10-CM | POA: Diagnosis not present

## 2015-09-19 HISTORY — DX: Essential (primary) hypertension: I10

## 2015-09-19 HISTORY — DX: Unspecified osteoarthritis, unspecified site: M19.90

## 2015-09-19 LAB — CBC
HCT: 36.1 % (ref 36.0–46.0)
HEMOGLOBIN: 11.5 g/dL — AB (ref 12.0–15.0)
MCH: 26 pg (ref 26.0–34.0)
MCHC: 31.9 g/dL (ref 30.0–36.0)
MCV: 81.7 fL (ref 78.0–100.0)
Platelets: 153 10*3/uL (ref 150–400)
RBC: 4.42 MIL/uL (ref 3.87–5.11)
RDW: 17 % — ABNORMAL HIGH (ref 11.5–15.5)
WBC: 9.9 10*3/uL (ref 4.0–10.5)

## 2015-09-19 LAB — BASIC METABOLIC PANEL
ANION GAP: 13 (ref 5–15)
BUN: 15 mg/dL (ref 6–20)
CHLORIDE: 104 mmol/L (ref 101–111)
CO2: 22 mmol/L (ref 22–32)
CREATININE: 1.01 mg/dL — AB (ref 0.44–1.00)
Calcium: 9.1 mg/dL (ref 8.9–10.3)
GFR calc Af Amer: >60 mL/min (ref >60)
GFR calc non Af Amer: >60 mL/min (ref >60)
Glucose, Bld: 99 mg/dL (ref 65–99)
Potassium: 3.1 mmol/L — ABNORMAL LOW (ref 3.5–5.1)
Sodium: 139 mmol/L (ref 135–145)

## 2015-09-19 LAB — FERRITIN: FERRITIN: 5 ng/mL — AB (ref 11–307)

## 2015-09-19 LAB — VITAMIN B12: VITAMIN B 12: 429 pg/mL (ref 180–914)

## 2015-09-19 LAB — TROPONIN I
TROPONIN I: 0.09 ng/mL — AB (ref <0.031)
Troponin I: 0.07 ng/mL — ABNORMAL HIGH (ref <0.031)

## 2015-09-19 LAB — TSH: TSH: 2.604 u[IU]/mL (ref 0.350–4.500)

## 2015-09-19 LAB — MAGNESIUM: Magnesium: 2.1 mg/dL (ref 1.7–2.4)

## 2015-09-19 LAB — I-STAT TROPONIN, ED
Troponin i, poc: 0.05 ng/mL (ref 0.00–0.08)
Troponin i, poc: 0.05 ng/mL (ref 0.00–0.08)

## 2015-09-19 MED ORDER — LABETALOL HCL 5 MG/ML IV SOLN
2.0000 mg/min | INTRAVENOUS | Status: DC
  Administered 2015-09-19: 2 mg/min via INTRAVENOUS
  Filled 2015-09-19 (×2): qty 100

## 2015-09-19 MED ORDER — TRAMADOL HCL 50 MG PO TABS
50.0000 mg | ORAL_TABLET | Freq: Four times a day (QID) | ORAL | Status: DC | PRN
  Administered 2015-09-19: 50 mg via ORAL
  Filled 2015-09-19: qty 1

## 2015-09-19 MED ORDER — ACETAMINOPHEN 325 MG PO TABS: 650.0000 mg | ORAL_TABLET | Freq: Four times a day (QID) | ORAL | Status: DC | PRN

## 2015-09-19 MED ORDER — AMLODIPINE BESYLATE 5 MG PO TABS
5.0000 mg | ORAL_TABLET | Freq: Every day | ORAL | Status: DC
Start: 2015-09-19 — End: 2015-09-19

## 2015-09-19 MED ORDER — POTASSIUM CHLORIDE CRYS ER 20 MEQ PO TBCR: 40.0000 meq | EXTENDED_RELEASE_TABLET | Freq: Two times a day (BID) | ORAL | Status: DC

## 2015-09-19 MED ORDER — LABETALOL HCL 5 MG/ML IV SOLN
20.0000 mg | Freq: Once | INTRAVENOUS | Status: AC
  Administered 2015-09-19: 20 mg via INTRAVENOUS
  Filled 2015-09-19: qty 4

## 2015-09-19 MED ORDER — HEPARIN SODIUM (PORCINE) 5000 UNIT/ML IJ SOLN
5000.0000 [IU] | Freq: Three times a day (TID) | INTRAMUSCULAR | Status: DC
Start: 2015-09-19 — End: 2015-09-20
  Administered 2015-09-19 – 2015-09-20 (×3): 5000 [IU] via SUBCUTANEOUS
  Filled 2015-09-19 (×3): qty 1

## 2015-09-19 MED ORDER — POTASSIUM CHLORIDE CRYS ER 20 MEQ PO TBCR
40.0000 meq | EXTENDED_RELEASE_TABLET | Freq: Once | ORAL | Status: AC
  Administered 2015-09-19: 40 meq via ORAL
  Filled 2015-09-19: qty 2

## 2015-09-19 MED ORDER — LISINOPRIL 20 MG PO TABS
20.0000 mg | ORAL_TABLET | Freq: Every day | ORAL | Status: DC
  Administered 2015-09-19 – 2015-09-20 (×2): 20 mg via ORAL
  Filled 2015-09-19 (×2): qty 1

## 2015-09-19 MED ORDER — LABETALOL HCL 5 MG/ML IV SOLN
5.0000 mg | INTRAVENOUS | Status: DC | PRN
  Filled 2015-09-19: qty 4

## 2015-09-19 MED ORDER — ACETAMINOPHEN 325 MG PO TABS
650.0000 mg | ORAL_TABLET | Freq: Four times a day (QID) | ORAL | Status: DC | PRN
  Filled 2015-09-19: qty 2

## 2015-09-19 MED ORDER — AMLODIPINE BESYLATE 10 MG PO TABS
10.0000 mg | ORAL_TABLET | Freq: Every day | ORAL | Status: DC
  Administered 2015-09-19 – 2015-09-20 (×2): 10 mg via ORAL
  Filled 2015-09-19 (×2): qty 1

## 2015-09-19 NOTE — Progress Notes (Signed)
NEHARIKA BARBOZA [id number] Admission Data: 09/19/2015 6:04 PM Attending Provider: Aldine Contes, MD  PCP:No primary care provider on file. Consults/ Treatment Team:    LANEYA KRIESEL is a 55 y.o. female patient admitted from ED awake, alert  & orientated  X 3,  Full Code, VSS - Blood pressure 186/86, pulse 76, temperature 97.5 F (36.4 C), temperature source Oral, resp. rate 20, height 5\' 3"  (1.6 m), weight 89.103 kg (196 lb 7 oz), last menstrual period 09/15/2013, SpO2 100 %., no c/o shortness of breath, no c/o chest pain, no distress noted. Tele # 24 placed.  IV site WDL:  SL.   Allergies:  No Known Allergies   Past Medical History  Diagnosis Date  . Hypertension   . Arthritis       Pt orientation to unit, room and routine. Information packet given to patient/family and safety video watched.  Admission INP armband ID verified with patient/family, and in place. SR up x 2, fall risk assessment complete with Patient and family verbalizing understanding of risks associated with falls. Pt verbalizes an understanding of how to use the call bell and to call for help before getting out of bed.  Skin, clean-dry- intact without evidence of bruising, or skin tears.   No evidence of skin break down noted on exam.    Will cont to monitor and assist as needed.  Dayle Points, RN 09/19/2015 6:04 PM

## 2015-09-19 NOTE — H&P (Signed)
Date: 09/19/2015               Patient Name:  [name] MRN: [id number]  DOB: 1960-09-15 Age / Sex: 55 y.o., female   PCP: No primary care provider on file.         Medical Service: Internal Medicine Teaching Service         Attending Physician: Dr. Aldine Contes, MD    First Contact: Dr. Liberty Handy Pager: [phone]  Second Contact: Dr. Charlyne Mom Pager: [phone]       After Hours (After 5p/  First Contact Pager: [phone]  weekends / holidays): Second Contact Pager: [phone]   Chief Complaint: Headache  History of Present Illness:   [name] is a 55 year old with a PMH of HTN, poorly characterized arthritis, and iron-deficiency anemia who presents with a headache and vision changes. She reports that she started to "feel funny" about a week ago, and on Saturday, she noticed blurry vision and color distortions (purple) from her right eye accompanied by global headaches. She had never felt anything like this before. She's also noticed some new onset confusion. She also endorsed some light-headedness and nausea. She endorsed some "chest tightness" that has since resolved. She denied any sore throat, shortness of breath, vomiting, diarrhea, speech changes, or new one-sided weakness.   She reported a longstanding history of "rheumatoid arthritis" that was treated at [facility] with injections. She says the pain is constant and is not worse in the morning or evening. She said it's associated with a "pins and needles" sensation in her hands and legs as well as bruising. She reports it being particularly worse in her right knee. She only takes ibuprofen for it. She reports feeling thirsty often, but denies polyuria or dysuria. She denies any joint swelling. She said she has been screened for diabetes this year, and she did not have it. She also endorses a history of iron deficiency anemia, and she has taken iron pills in the past. She has also had a cataract surgery for her  left eye. Her last menstrual period was Dec 19. 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 She drinks occasional, but does not smoke or use illicit drugs, including cocaine. Family history is significant for her father having CAD and a CABG, other family history non-contributory.   In the ED, she was found to be hypertensive to 246/127. CT head showed no evidence of intracranial hypertension, no acute intracranial pathology. Creatinine was elevated to 1.01 over her baseline of 0.8. Troponin was 0.05. She was started on IV labetolol and her blood pressure responded to 140/77.     Meds: Current Facility-Administered Medications  Medication Dose Route Frequency Provider Last Rate Last Dose  . amLODipine (NORVASC) tablet 10 mg  10 mg Oral Daily Norman Herrlich, MD   10 mg at 09/19/15 1824  . heparin injection 5,000 Units  5,000 Units Subcutaneous 3 times per day Norman Herrlich, MD   5,000 Units at 09/19/15 1824  . labetalol (NORMODYNE,TRANDATE) injection 5 mg  5 mg Intravenous Q2H PRN Liberty Handy, MD      . lisinopril (PRINIVIL,ZESTRIL) tablet 20 mg  20 mg Oral Daily Norman Herrlich, MD   20 mg at 09/19/15 1824    Allergies: Allergies as of 09/19/2015  . (No Known Allergies)   Past Medical History  Diagnosis Date  . Hypertension   . Arthritis    Past Surgical History  Procedure Laterality  Date  . Cesarean section     History reviewed. No pertinent family history. Social History   Social History  . Marital Status: Divorced    Spouse Name: N/A  . Number of Children: N/A  . Years of Education: N/A   Occupational History  . Not on file.   Social History Main Topics  . Smoking status: Never Smoker   . Smokeless tobacco: Not on file  . Alcohol Use: No  . Drug Use: No  . Sexual Activity: Not on file   Other Topics Concern  . Not on file   Social History Narrative    Review of Systems: Negative except per HPI  Physical Exam: Blood pressure 186/86, pulse 76,  temperature 97.5 F (36.4 C), temperature source Oral, resp. rate 20, height 5\' 3"  (1.6 m), weight 196 lb 6.9 oz (89.1 kg), last menstrual period 09/15/2013, SpO2 100 %. General: Lying in bed, NAD. Well-nourished, well-developed HEENT: No papilledema, EOMI, PERRL, no scleral icterus, moist mucous membranes. No cervical adenopathy. Cardiovascular: RRR, no murmurs or rubs. Normal S1, S2 Pulmonary: Clear to auscultation bilaterally.  Abdominal: Soft NT/ND. Normal bowel sounds Extremities: No clubbing, cyanosis, or edema MSK: Tenderness to palpation of knee joints, right greater than left. Nodule versus fat pad superior to right patella. No effusions or redness. No swan-neck deformity in hands.  Skin: Bruising in lower extremities. No rashes, warm and dry. Neurological: AAOx4. Tongue midline, face symmetric. Sensation to light-touch in tact. 5/5 strength in all extremities. No speech difficulty. Psychiatric: Normal behavior and affect  Lab results: Basic Metabolic Panel:  Recent Labs  09/19/15 1230  NA 139  K 3.1*  CL 104  CO2 22  GLUCOSE 99  BUN 15  CREATININE 1.01*  CALCIUM 9.1   CBC:  Recent Labs  09/19/15 1230  WBC 9.9  HGB 11.5*  HCT 36.1  MCV 81.7  PLT 153    Imaging results:  Dg Chest 2 View  09/19/2015  CLINICAL DATA:  Chest pain. EXAM: CHEST  2 VIEW COMPARISON:  03/10/2008. FINDINGS: Mediastinum hilar structures normal. Cardiomegaly with normal pulmonary vascularity. No focal infiltrate. No pleural effusion or pneumothorax . IMPRESSION: Cardiomegaly. No overt congestive heart failure. No focal infiltrate . Electronically Signed   By: Marcello Moores  Register   On: 09/19/2015 13:17   Ct Head Wo Contrast  09/19/2015  CLINICAL DATA:  Chest tightness, left arm tightness since yesterday EXAM: CT HEAD WITHOUT CONTRAST TECHNIQUE: Contiguous axial images were obtained from the base of the skull through the vertex without intravenous contrast. COMPARISON:  None. FINDINGS: There is no  evidence of mass effect, midline shift, or extra-axial fluid collections. There is no evidence of a space-occupying lesion or intracranial hemorrhage. There is no evidence of a cortical-based area of acute infarction. There is generalized cerebral atrophy. There is periventricular white matter low attenuation likely secondary to microangiopathy. The ventricles and sulci are appropriate for the patient's age. The basal cisterns are patent. Visualized portions of the orbits are unremarkable. The visualized portions of the paranasal sinuses and mastoid air cells are unremarkable. Cerebrovascular atherosclerotic calcifications are noted. The osseous structures are unremarkable. IMPRESSION: 1. No acute intracranial pathology. 2. Chronic microvascular disease and cerebral atrophy. Electronically Signed   By: Kathreen Devoid   On: 09/19/2015 13:46    EKG: Normal sinus rhythm Nonspecific ST and T wave  abnormality Prolonged QT Abnormal ECG   Assessment & Plan by Problem:  Hypertensive Emergency: Likely due to medication non-adherence and little follow-up for  essential hypertension. She does not have intracranial hypertension on imaging or exam. She could possibly have an AKI due to her HTN. It has responded well to labetolol. This will likely resolve with continuation of oral medications and prn labetolol.  - Lisinopril 20 mg daily - Amlodipine 10 mg daily - Labetolol 5 mg IV for BPs >220/110 - BMET in AM  Chest pain: Now resolved. EKG is non-specific - EKG in AM - Trending troponins  AKI: Likely due to hypertensive emergency - BMET in AM  Hypokalemia: - Avoid HCTZ, lasix for HTN management - 40 mEq Kdur - check Mg - BMET in AM  Anemia: Hgb 11.5 on admission. Patient reports a history of iron deficiency anemia. - Ferritin pending - CBC in AM  Arthritis: No RF or other serologies on record. Could simply be a case of osteoarthritis, but this will likely need outpatient follow-up. HCV arthritis is  a consideration, and she fits the screening criteria regardless - HCV Ab with reflex - Imaging and other serologies as outpatient  Parasthesias: Undiagnosed diabetes is a consideration in setting of patient's obesity.  - A1c, TSH, B12 pending  DVT Prophylaxis: Heparin [place]  Dispo: Disposition is deferred at this time, awaiting improvement of current medical problems. Anticipated discharge in approximately 1-2 day(s).   The patient does have a current PCP (No primary care provider on file.) and does need an [facility] hospital follow-up appointment after discharge.  The patient does not have transportation limitations that hinder transportation to clinic appointments.  Signed: Liberty Handy, MD 09/19/2015, 6:53 PM

## 2015-09-19 NOTE — ED Notes (Signed)
Patient pulled removed IV out. Pharmacist notified [facility]

## 2015-09-19 NOTE — Progress Notes (Signed)
Notified MD on call that pt having stabbing pain in bilateral legs rating it a 7. Pt requesting pain medication. MD stated they would put in order for pain meds. Will continue to monitor pt.  Ranelle Oyster, RN

## 2015-09-19 NOTE — ED Notes (Signed)
MD made aware patient BP 140 Medication at 1mg /Min 57mL/ hr.

## 2015-09-19 NOTE — ED Provider Notes (Signed)
CSN: [id number]     Arrival date & time 09/19/15  1230 History   First MD Initiated Contact with Patient 09/19/15 1310     Chief Complaint  Patient presents with  . Blurred Vision  . Chest Pain     (Consider location/radiation/quality/duration/timing/severity/associated sxs/prior Treatment) HPI  55 year old female history of hypertension but not treated presents today complaining of blurring of her vision and chest pain and headache that began yesterday afternoon. She describes the blurred vision as a Hayes of purple mostly in the right eye when she blinks. The headache was all over her head and sharp but is now more localized to the right. The chest pain she describes as tight and previously as a at a 9 but now down to 6 out of 10. Pressure in triage was elevated at 246/127. She states she has not taken medication for an extended period of time and has not had her blood pressure checked for several years  Past Medical History  Diagnosis Date  . Hypertension   . Arthritis    Past Surgical History  Procedure Laterality Date  . Cesarean section     History reviewed. No pertinent family history. Social History  Substance Use Topics  . Smoking status: Never Smoker   . Smokeless tobacco: None  . Alcohol Use: No   OB History    No data available     Review of Systems  All other systems reviewed and are negative.     Allergies  Review of patient's allergies indicates no known allergies.  Home Medications   Prior to Admission medications   Medication Sig Start Date End Date Taking? Authorizing Provider  ibuprofen (ADVIL,MOTRIN) 200 MG tablet Take 200 mg by mouth every 6 (six) hours as needed for moderate pain.   Yes Historical Provider, MD   BP 239/106 mmHg  Pulse 86  Temp(Src) 98.9 F (37.2 C) (Oral)  Resp 28  Ht 5\' 3"  (1.6 m)  Wt 89.103 kg  BMI 34.81 kg/m2  SpO2 100%  LMP 09/15/2013 Physical Exam  Constitutional: She is oriented to person, place, and time. She  appears well-developed and well-nourished.  Obese  HENT:  Head: Normocephalic and atraumatic.  Right Ear: External ear normal.  Left Ear: External ear normal.  Mouth/Throat: Oropharynx is clear and moist.  Eyes: Conjunctivae and EOM are normal. Pupils are equal, round, and reactive to light.  Fundoscopic exam:      The right eye shows no exudate, no hemorrhage and no papilledema.       The left eye shows no exudate, no hemorrhage and no papilledema.  Neck: Normal range of motion. Neck supple.  Cardiovascular: Normal rate, regular rhythm, normal heart sounds and intact distal pulses.   Pulmonary/Chest: Effort normal and breath sounds normal.  Abdominal: Soft. Bowel sounds are normal.  Musculoskeletal: Normal range of motion.  Neurological: She is alert and oriented to person, place, and time. She has normal reflexes. No cranial nerve deficit. Coordination normal.  Skin: Skin is warm and dry.  Psychiatric: She has a normal mood and affect. Her behavior is normal. Judgment and thought content normal.  Nursing note and vitals reviewed.   ED Course  Procedures (including critical care time) Labs Review Labs Reviewed  BASIC METABOLIC PANEL - Abnormal; Notable for the following:    Potassium 3.1 (*)    Creatinine, Ser 1.01 (*)    All other components within normal limits  CBC - Abnormal; Notable for the following:    Hemoglobin  11.5 (*)    RDW 17.0 (*)    All other components within normal limits  I-STAT TROPOININ, ED  Randolm Idol, ED    Imaging Review Dg Chest 2 View  09/19/2015  CLINICAL DATA:  Chest pain. EXAM: CHEST  2 VIEW COMPARISON:  03/10/2008. FINDINGS: Mediastinum hilar structures normal. Cardiomegaly with normal pulmonary vascularity. No focal infiltrate. No pleural effusion or pneumothorax . IMPRESSION: Cardiomegaly. No overt congestive heart failure. No focal infiltrate . Electronically Signed   By: Marcello Moores  Register   On: 09/19/2015 13:17   Ct Head Wo  Contrast  09/19/2015  CLINICAL DATA:  Chest tightness, left arm tightness since yesterday EXAM: CT HEAD WITHOUT CONTRAST TECHNIQUE: Contiguous axial images were obtained from the base of the skull through the vertex without intravenous contrast. COMPARISON:  None. FINDINGS: There is no evidence of mass effect, midline shift, or extra-axial fluid collections. There is no evidence of a space-occupying lesion or intracranial hemorrhage. There is no evidence of a cortical-based area of acute infarction. There is generalized cerebral atrophy. There is periventricular white matter low attenuation likely secondary to microangiopathy. The ventricles and sulci are appropriate for the patient's age. The basal cisterns are patent. Visualized portions of the orbits are unremarkable. The visualized portions of the paranasal sinuses and mastoid air cells are unremarkable. Cerebrovascular atherosclerotic calcifications are noted. The osseous structures are unremarkable. IMPRESSION: 1. No acute intracranial pathology. 2. Chronic microvascular disease and cerebral atrophy. Electronically Signed   By: Kathreen Devoid   On: 09/19/2015 13:46   I have personally reviewed and evaluated these images and lab results as part of my medical decision-making.   EKG Interpretation   Date/Time:  Monday September 19 2015 12:36:59 EST Ventricular Rate:  93 PR Interval:  172 QRS Duration: 90 QT Interval:  396 QTC Calculation: 492 R Axis:   38 Text Interpretation:  Normal sinus rhythm Nonspecific ST and T wave  abnormality Prolonged QT Abnormal ECG Confirmed by Dillard Pascal MD, Andee Poles  [id number]) on 09/19/2015 1:55:23 PM      MDM   Final diagnoses:  Hypertensive emergency    Evaluations here concerning for hypertensive emergency. Labetalol is being started IV.  Plan admission to step down for ongoing monitoring and management.   Discussed with Dr. Hulen Luster and patient to be admitted to stepdown unit  Pattricia Boss, MD 09/19/15 [phone]

## 2015-09-19 NOTE — ED Notes (Signed)
Admitting MD states not to start Labetaol

## 2015-09-19 NOTE — ED Notes (Signed)
Attempted report 

## 2015-09-19 NOTE — ED Notes (Signed)
Pt here for blurry vision that started Sunday. sts also some tightness in her chest and left arm. sts also headache.sts numbness in legs. Pt very hypertensive at triage 246/127

## 2015-09-20 ENCOUNTER — Observation Stay (HOSPITAL_COMMUNITY): Payer: 59

## 2015-09-20 DIAGNOSIS — R7303 Prediabetes: Secondary | ICD-10-CM | POA: Diagnosis not present

## 2015-09-20 DIAGNOSIS — I10 Essential (primary) hypertension: Secondary | ICD-10-CM | POA: Diagnosis not present

## 2015-09-20 DIAGNOSIS — I161 Hypertensive emergency: Principal | ICD-10-CM

## 2015-09-20 DIAGNOSIS — M170 Bilateral primary osteoarthritis of knee: Secondary | ICD-10-CM | POA: Diagnosis not present

## 2015-09-20 DIAGNOSIS — D509 Iron deficiency anemia, unspecified: Secondary | ICD-10-CM

## 2015-09-20 DIAGNOSIS — N179 Acute kidney failure, unspecified: Secondary | ICD-10-CM | POA: Diagnosis not present

## 2015-09-20 DIAGNOSIS — Z9114 Patient's other noncompliance with medication regimen: Secondary | ICD-10-CM | POA: Diagnosis not present

## 2015-09-20 DIAGNOSIS — E876 Hypokalemia: Secondary | ICD-10-CM | POA: Diagnosis not present

## 2015-09-20 DIAGNOSIS — R202 Paresthesia of skin: Secondary | ICD-10-CM

## 2015-09-20 DIAGNOSIS — E875 Hyperkalemia: Secondary | ICD-10-CM | POA: Diagnosis not present

## 2015-09-20 DIAGNOSIS — M179 Osteoarthritis of knee, unspecified: Secondary | ICD-10-CM | POA: Diagnosis not present

## 2015-09-20 DIAGNOSIS — R209 Unspecified disturbances of skin sensation: Secondary | ICD-10-CM | POA: Diagnosis not present

## 2015-09-20 LAB — CBC
HCT: 28.4 % — ABNORMAL LOW (ref 36.0–46.0)
HEMATOCRIT: 27.9 % — AB (ref 36.0–46.0)
HEMOGLOBIN: 8.9 g/dL — AB (ref 12.0–15.0)
HEMOGLOBIN: 9.2 g/dL — AB (ref 12.0–15.0)
MCH: 25.9 pg — AB (ref 26.0–34.0)
MCH: 26.1 pg (ref 26.0–34.0)
MCHC: 31.9 g/dL (ref 30.0–36.0)
MCHC: 32.4 g/dL (ref 30.0–36.0)
MCV: 81.1 fL (ref 78.0–100.0)
MCV: 80.7 fL (ref 78.0–100.0)
PLATELETS: 122 10*3/uL — AB (ref 150–400)
Platelets: 119 10*3/uL — ABNORMAL LOW (ref 150–400)
RBC: 3.44 MIL/uL — AB (ref 3.87–5.11)
RBC: 3.52 MIL/uL — AB (ref 3.87–5.11)
RDW: 17.5 % — ABNORMAL HIGH (ref 11.5–15.5)
RDW: 17.5 % — ABNORMAL HIGH (ref 11.5–15.5)
WBC: 10.2 10*3/uL (ref 4.0–10.5)
WBC: 9.1 10*3/uL (ref 4.0–10.5)

## 2015-09-20 LAB — COMPREHENSIVE METABOLIC PANEL
ALBUMIN: 2.8 g/dL — AB (ref 3.5–5.0)
ALT: 13 U/L — ABNORMAL LOW (ref 14–54)
ANION GAP: 12 (ref 5–15)
AST: 13 U/L — ABNORMAL LOW (ref 15–41)
Alkaline Phosphatase: 51 U/L (ref 38–126)
BUN: 17 mg/dL (ref 6–20)
CO2: 22 mmol/L (ref 22–32)
Calcium: 8.2 mg/dL — ABNORMAL LOW (ref 8.9–10.3)
Chloride: 106 mmol/L (ref 101–111)
Creatinine, Ser: 1.1 mg/dL — ABNORMAL HIGH (ref 0.44–1.00)
GFR calc non Af Amer: 56 mL/min — ABNORMAL LOW (ref >60)
GLUCOSE: 101 mg/dL — AB (ref 65–99)
POTASSIUM: 3.1 mmol/L — AB (ref 3.5–5.1)
SODIUM: 140 mmol/L (ref 135–145)
Total Bilirubin: 0.3 mg/dL (ref 0.3–1.2)
Total Protein: 6.1 g/dL — ABNORMAL LOW (ref 6.5–8.1)

## 2015-09-20 LAB — HEPATITIS C ANTIBODY (REFLEX): HCV Ab: <0.1 s/co ratio (ref 0.0–0.9)

## 2015-09-20 LAB — HCV COMMENT:

## 2015-09-20 LAB — TROPONIN I: TROPONIN I: 0.07 ng/mL — AB (ref <0.031)

## 2015-09-20 LAB — HIV ANTIBODY (ROUTINE TESTING W REFLEX): HIV Screen 4th Generation wRfx: NONREACTIVE

## 2015-09-20 LAB — HEMOGLOBIN A1C
HEMOGLOBIN A1C: 6.1 % — AB (ref 4.8–5.6)
MEAN PLASMA GLUCOSE: 128 mg/dL

## 2015-09-20 MED ORDER — SODIUM CHLORIDE 0.9 % IV SOLN: 510.0000 mg | Freq: Once | INTRAVENOUS | Status: DC

## 2015-09-20 MED ORDER — SODIUM CHLORIDE 0.9 % IV SOLN
INTRAVENOUS | Status: DC
  Administered 2015-09-20: 09:00:00 via INTRAVENOUS

## 2015-09-20 MED ORDER — FERROUS SULFATE 325 (65 FE) MG PO TABS: 325.0000 mg | ORAL_TABLET | Freq: Two times a day (BID) | ORAL | Status: DC

## 2015-09-20 MED ORDER — LISINOPRIL 20 MG PO TABS: 20.0000 mg | ORAL_TABLET | Freq: Every day | ORAL | Status: DC

## 2015-09-20 MED ORDER — AMLODIPINE BESYLATE 10 MG PO TABS: 10.0000 mg | ORAL_TABLET | Freq: Every day | ORAL | Status: DC

## 2015-09-20 MED ORDER — POTASSIUM CHLORIDE CRYS ER 20 MEQ PO TBCR
40.0000 meq | EXTENDED_RELEASE_TABLET | Freq: Once | ORAL | Status: AC
  Administered 2015-09-20: 40 meq via ORAL
  Filled 2015-09-20: qty 2

## 2015-09-20 MED ORDER — FERUMOXYTOL INJECTION 510 MG/17 ML
510.0000 mg | Freq: Once | INTRAVENOUS | Status: AC
  Administered 2015-09-20: 510 mg via INTRAVENOUS
  Filled 2015-09-20 (×2): qty 17

## 2015-09-20 NOTE — Progress Notes (Signed)
Subjective: Patient had one episode of non-radiating chest tightness at night. She continues to have headache and purple aura in her right eye. She also complains of a "pins and needles" pain in her legs, and received 1 dose tramadol last night for it.  Objective: Vital signs in last 24 hours: Filed Vitals:   09/19/15 1715 09/19/15 1801 09/19/15 2156 09/20/15 0540  BP:  186/86 136/63 129/62  Pulse: 71 76 77 72  Temp:  97.5 F (36.4 C) 99 F (37.2 C) 98.5 F (36.9 C)  TempSrc:  Oral Oral Oral  Resp: 13 20 20 18   Height:  5\' 3"  (1.6 m)    Weight:  196 lb 6.9 oz (89.1 kg)    SpO2: 100% 100% 99% 100%   Weight change:   Intake/Output Summary (Last 24 hours) at 09/20/15 1135 Last data filed at 09/20/15 1110  Gross per 24 hour  Intake     50 ml  Output      1 ml  Net     49 ml   Physical Exam General: Sitting up in bed, NAD Cardiovascular: RRR, 2/6 flow murmur heard. No rubs Pulmonary: Clear to auscultation bilaterally. No respiratory distress.  Abdominal: Soft NT/ND. Normal bowel sounds Extremities: No clubbing, cyanosis, or edema MSK: Tenderness to palpation of knee joints, right greater than left. Nodule versus fat pad superior to right patella. No effusions or redness. No swan-neck deformity in hands.  Skin: Bruising in lower extremities. No rashes, warm and dry. Psychiatric: Normal behavior and affect  Lab Results: Basic Metabolic Panel:  Recent Labs Lab 09/19/15 1230 09/19/15 1829 09/20/15 0453  NA 139  --  140  K 3.1*  --  3.1*  CL 104  --  106  CO2 22  --  22  GLUCOSE 99  --  101*  BUN 15  --  17  CREATININE 1.01*  --  1.10*  CALCIUM 9.1  --  8.2*  MG  --  2.1  --    Liver Function Tests:  Recent Labs Lab 09/20/15 0453  AST 13*  ALT 13*  ALKPHOS 51  BILITOT 0.3  PROT 6.1*  ALBUMIN 2.8*   CBC:  Recent Labs Lab 09/19/15 1230 09/20/15 0453  WBC 9.9 10.2  HGB 11.5* 8.9*  HCT 36.1 27.9*  MCV 81.7 81.1  PLT 153 119*   Cardiac  Enzymes:  Recent Labs Lab 09/19/15 1829 09/19/15 2300 09/20/15 0453  TROPONINI 0.09* 0.07* 0.07*   Thyroid Function Tests:  Recent Labs Lab 09/19/15 1829  TSH 2.604   Anemia Panel:  Recent Labs Lab 09/19/15 1829  VITAMINB12 429  FERRITIN 5*    Micro Results: No results found for this or any previous visit (from the past 240 hour(s)). Studies/Results: Dg Chest 2 View  09/19/2015  CLINICAL DATA:  Chest pain. EXAM: CHEST  2 VIEW COMPARISON:  03/10/2008. FINDINGS: Mediastinum hilar structures normal. Cardiomegaly with normal pulmonary vascularity. No focal infiltrate. No pleural effusion or pneumothorax . IMPRESSION: Cardiomegaly. No overt congestive heart failure. No focal infiltrate . Electronically Signed   By: Marcello Moores  Register   On: 09/19/2015 13:17   Ct Head Wo Contrast  09/19/2015  CLINICAL DATA:  Chest tightness, left arm tightness since yesterday EXAM: CT HEAD WITHOUT CONTRAST TECHNIQUE: Contiguous axial images were obtained from the base of the skull through the vertex without intravenous contrast. COMPARISON:  None. FINDINGS: There is no evidence of mass effect, midline shift, or extra-axial fluid collections. There is no evidence  of a space-occupying lesion or intracranial hemorrhage. There is no evidence of a cortical-based area of acute infarction. There is generalized cerebral atrophy. There is periventricular white matter low attenuation likely secondary to microangiopathy. The ventricles and sulci are appropriate for the patient's age. The basal cisterns are patent. Visualized portions of the orbits are unremarkable. The visualized portions of the paranasal sinuses and mastoid air cells are unremarkable. Cerebrovascular atherosclerotic calcifications are noted. The osseous structures are unremarkable. IMPRESSION: 1. No acute intracranial pathology. 2. Chronic microvascular disease and cerebral atrophy. Electronically Signed   By: Kathreen Devoid   On: 09/19/2015 13:46   Dg  Knee Complete 4 Views Left  09/20/2015  CLINICAL DATA:  Bowlegged, chronic bilateral knee pain EXAM: RIGHT KNEE - COMPLETE 4+ VIEW; LEFT KNEE - COMPLETE 4+ VIEW COMPARISON:  None in PACs FINDINGS: The bones of the knees are adequately mineralized. There is at junction at both knee joints. There is high-grade joint space loss of the medial compartments bilaterally. There are osteophytes arising from the articular margins of the medial tibial plateaus and adjacent peel femoral condyles. The lateral joint compartments are well maintained. There is mild beaking of the tibial spines. There is joint space loss of the patellofemoral compartments bilaterally. Large osteophytes arise from the articular margins of the patella and from the superior articular margins of the femoral condyles. The proximal fibulas are intact. IMPRESSION: Severe osteoarthritic change of the medial joint compartments slightly greater on the left than on the right with moderate osteoarthritic changes of the patellofemoral compartments. There is abduction at both knee joints giving the bow leg appearance. Electronically Signed   By: David  Martinique M.D.   On: 09/20/2015 10:22   Dg Knee Complete 4 Views Right  09/20/2015  CLINICAL DATA:  Bowlegged, chronic bilateral knee pain EXAM: RIGHT KNEE - COMPLETE 4+ VIEW; LEFT KNEE - COMPLETE 4+ VIEW COMPARISON:  None in PACs FINDINGS: The bones of the knees are adequately mineralized. There is at junction at both knee joints. There is high-grade joint space loss of the medial compartments bilaterally. There are osteophytes arising from the articular margins of the medial tibial plateaus and adjacent peel femoral condyles. The lateral joint compartments are well maintained. There is mild beaking of the tibial spines. There is joint space loss of the patellofemoral compartments bilaterally. Large osteophytes arise from the articular margins of the patella and from the superior articular margins of the femoral  condyles. The proximal fibulas are intact. IMPRESSION: Severe osteoarthritic change of the medial joint compartments slightly greater on the left than on the right with moderate osteoarthritic changes of the patellofemoral compartments. There is abduction at both knee joints giving the bow leg appearance. Electronically Signed   By: David  Martinique M.D.   On: 09/20/2015 10:22   Medications: I have reviewed the patient's current medications. Scheduled Meds: . amLODipine  10 mg Oral Daily  . [START ON 09/21/2015] ferrous sulfate  325 mg Oral BID WC  . ferumoxytol  510 mg Intravenous Once  . heparin  5,000 Units Subcutaneous 3 times per day  . lisinopril  20 mg Oral Daily   Continuous Infusions: . sodium chloride 75 mL/hr at 09/20/15 0834   PRN Meds:.labetalol, traMADol Assessment/Plan:  Hypertensive Emergency: Likely due to medication non-adherence and little follow-up for essential hypertension. However, secondary HTN (e.g. Hyperaldosteronism in setting of hyperkalemia) will be investigated. She may need renal ultrasound if HTN does not improve on antihypertensives to investigate RAS. She does not have intracranial  hypertension on imaging or exam. She could possibly have an AKI due to her HTN. - PRA pending - Lisinopril 20 mg daily - Amlodipine 10 mg daily - Labetolol 5 mg IV for BPs >220/110, has not needed any  Vision Changes: Persistent vision changes despite HTN resolution - Outpatient optho follow-up  Anemia: Hgb 11.5 on admission and 8.9 this morning. Patient reports a history of iron deficiency anemia, which is consistent with ferritin 5 today. She reports no bleeding episodes overnight. She reports a dark stool, but she recently had a colonoscopy in the past couple years that was normal.  - CBC this afternoon  AKI: Cr increase from 1.01 to 1.11. Likely due to hypertensive emergency.  - 75 cc/hr NS  Hypokalemia: K 3.1 again this morning. Mg is normal. - Avoid HCTZ, lasix for HTN  management - 40 mEq Kdur  Arthritis: Knee x-rays consistent with severe osteoarthritis bilaterally.  - Tramadol 50 q6h prn   Parasthesias: A1c 6.4, TSH and B12 normal. Etiology is unclear at this time   Chest pain: Troponins increased to 0.09 and decreased to 0.07. EKG changes are nonspecific. Does not appear to be ACS symptoms. No further workup at this time.   DVT Prophylaxis: Heparin Patrick  Dispo: Discharge home today or tomorrow.  The patient does not have a current PCP (No primary care provider on file.) and does need an [facility] hospital follow-up appointment after discharge.  The patient does not have transportation limitations that hinder transportation to clinic appointments.  .Services Needed at time of discharge: Y = Yes, Blank = No PT:   OT:   RN:   Equipment:   Other:     LOS: 1 day   Liberty Handy, MD 09/20/2015, 11:35 AM

## 2015-09-20 NOTE — Discharge Summary (Signed)
Name: [name] MRN: [id number] DOB: 12/12/1960 55 y.o. PCP: No primary care provider on file.  Date of Admission: 09/19/2015  1:04 PM Date of Discharge: 09/20/2015 Attending Physician: Aldine Contes, MD  Discharge Diagnosis: 1. Hypertensive Emergency 2. Iron Deficiency Anemia 3. Osteoarthritis 4. Pre-diabetes 5. Hypokalemia 6. Parasthesias  Discharge Medications:   Medication List    TAKE these medications        amLODipine 10 MG tablet  Commonly known as:  NORVASC  Take 1 tablet (10 mg total) by mouth daily.     ferrous sulfate 325 (65 FE) MG tablet  Take 1 tablet (325 mg total) by mouth 2 (two) times daily with a meal.  Start taking on:  09/21/2015     ibuprofen 200 MG tablet  Commonly known as:  ADVIL,MOTRIN  Take 200 mg by mouth every 6 (six) hours as needed for moderate pain.     lisinopril 20 MG tablet  Commonly known as:  PRINIVIL,ZESTRIL  Take 1 tablet (20 mg total) by mouth daily.        Disposition and follow-up:   [name] was discharged from [facility] in good condition.  At the hospital follow up visit please address:  1.  She will need outpatient management of her hypertension, osteoarthritis, and pre-diabetes. Consider renal US if HTN does not improve on lisinopril and amlodipine  2.  Labs / imaging needed at time of follow-up: BMET to assess for improvement in renal function and potassium, CBC  3.  Pending labs/ test needing follow-up: Aldosterone:Renin Ratio  Follow-up Appointments:     Follow-up Information    Follow up with Jacques Earthly, MD On 09/29/2015.   Specialty:  Internal Medicine   Why:  at 10:15 am   Contact information:   [address] [place] [address] [phone]       Follow up with Dr. Nyoka Cowden In 1 week.   Why:  Vision changes.      Discharge Instructions: Discharge Instructions    Diet - low sodium heart healthy    Complete by:  As directed      Increase activity  slowly    Complete by:  As directed           [name], it was a pleasure taking care of you. It is important that you take your new blood pressure medications, amlodipine and lisinopril, as prescribed to prevent this episode from happening again. Should you develop severe headaches or vision changes again, please seek medical attention. We will see you in the Internal Medicine Clinic on the ground floor of the hospital on January 19 at 10:15 am.   Procedures Performed:  Dg Chest 2 View  09/19/2015  CLINICAL DATA:  Chest pain. EXAM: CHEST  2 VIEW COMPARISON:  03/10/2008. FINDINGS: Mediastinum hilar structures normal. Cardiomegaly with normal pulmonary vascularity. No focal infiltrate. No pleural effusion or pneumothorax . IMPRESSION: Cardiomegaly. No overt congestive heart failure. No focal infiltrate . Electronically Signed   By: Marcello Moores  Register   On: 09/19/2015 13:17   Ct Head Wo Contrast  09/19/2015  CLINICAL DATA:  Chest tightness, left arm tightness since yesterday EXAM: CT HEAD WITHOUT CONTRAST TECHNIQUE: Contiguous axial images were obtained from the base of the skull through the vertex without intravenous contrast. COMPARISON:  None. FINDINGS: There is no evidence of mass effect, midline shift, or extra-axial fluid collections. There is no evidence of a space-occupying lesion or intracranial hemorrhage. There is no  evidence of a cortical-based area of acute infarction. There is generalized cerebral atrophy. There is periventricular white matter low attenuation likely secondary to microangiopathy. The ventricles and sulci are appropriate for the patient's age. The basal cisterns are patent. Visualized portions of the orbits are unremarkable. The visualized portions of the paranasal sinuses and mastoid air cells are unremarkable. Cerebrovascular atherosclerotic calcifications are noted. The osseous structures are unremarkable. IMPRESSION: 1. No acute intracranial pathology. 2. Chronic  microvascular disease and cerebral atrophy. Electronically Signed   By: Kathreen Devoid   On: 09/19/2015 13:46   Dg Knee Complete 4 Views Left  09/20/2015  CLINICAL DATA:  Bowlegged, chronic bilateral knee pain EXAM: RIGHT KNEE - COMPLETE 4+ VIEW; LEFT KNEE - COMPLETE 4+ VIEW COMPARISON:  None in PACs FINDINGS: The bones of the knees are adequately mineralized. There is at junction at both knee joints. There is high-grade joint space loss of the medial compartments bilaterally. There are osteophytes arising from the articular margins of the medial tibial plateaus and adjacent peel femoral condyles. The lateral joint compartments are well maintained. There is mild beaking of the tibial spines. There is joint space loss of the patellofemoral compartments bilaterally. Large osteophytes arise from the articular margins of the patella and from the superior articular margins of the femoral condyles. The proximal fibulas are intact. IMPRESSION: Severe osteoarthritic change of the medial joint compartments slightly greater on the left than on the right with moderate osteoarthritic changes of the patellofemoral compartments. There is abduction at both knee joints giving the bow leg appearance. Electronically Signed   By: David  Martinique M.D.   On: 09/20/2015 10:22   Dg Knee Complete 4 Views Right  09/20/2015  CLINICAL DATA:  Bowlegged, chronic bilateral knee pain EXAM: RIGHT KNEE - COMPLETE 4+ VIEW; LEFT KNEE - COMPLETE 4+ VIEW COMPARISON:  None in PACs FINDINGS: The bones of the knees are adequately mineralized. There is at junction at both knee joints. There is high-grade joint space loss of the medial compartments bilaterally. There are osteophytes arising from the articular margins of the medial tibial plateaus and adjacent peel femoral condyles. The lateral joint compartments are well maintained. There is mild beaking of the tibial spines. There is joint space loss of the patellofemoral compartments bilaterally. Large  osteophytes arise from the articular margins of the patella and from the superior articular margins of the femoral condyles. The proximal fibulas are intact. IMPRESSION: Severe osteoarthritic change of the medial joint compartments slightly greater on the left than on the right with moderate osteoarthritic changes of the patellofemoral compartments. There is abduction at both knee joints giving the bow leg appearance. Electronically Signed   By: David  Martinique M.D.   On: 09/20/2015 10:22    Admission HPI:   [name] is a 55 year old with a PMH of HTN, poorly characterized arthritis, and iron-deficiency anemia who presents with a headache and vision changes. She reports that she started to "feel funny" about a week ago, and on Saturday, she noticed blurry vision and color distortions (purple) from her right eye accompanied by global headaches. She had never felt anything like this before. She's also noticed some new onset confusion. She also endorsed some light-headedness and nausea. She endorsed some "chest tightness" that has since resolved. She denied any sore throat, shortness of breath, vomiting, diarrhea, speech changes, or new one-sided weakness. She reported a longstanding history of "rheumatoid arthritis" that was treated at [facility] with injections. She says the pain is constant  and is not worse in the morning or evening. She said it's associated with a "pins and needles" sensation in her hands and legs as well as bruising. She reports it being particularly worse in her right knee. She only takes ibuprofen for it. She reports feeling thirsty often, but denies polyuria or dysuria. She denies any joint swelling. She said she has been screened for diabetes this year, and she did not have it. She also endorses a history of iron deficiency anemia, and she has taken iron pills in the past. She has also had a cataract surgery for her left eye. Her last menstrual period was Dec 19. 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 She drinks occasional, but does not smoke or use illicit drugs, including cocaine. Family history is significant for her father having CAD and a CABG, other family history non-contributory. In the ED, she was found to be hypertensive to 246/127. CT head showed no evidence of intracranial hypertension, no acute intracranial pathology. Creatinine was elevated to 1.01 over her baseline of 0.8. Troponin was 0.05. She was started on IV labetolol and her blood pressure responded to 140/77.   Hospital Course by problem list:   Hypertensive Emergency: It was determined that the most likely etiology was non-adherence for essential hypertension. It was managed with Lisinopril 20 mg daily, Amlodipine, 10 mg daily, with a prn Labetalol 5 mg for BPs over 220/110. She did not need any prn labetalol and her blood pressure normalized on oral medications. On the morning of discharge, she endorsed persistent headaches and vision changes (purple aura in right visual field), but which were nearly resolved by the afternoon. A renin:angiotensin was pending on discharge. She was told to call her ophthalmologist, Dr. Nyoka Cowden, if she had persistent vision changes.  Iron Deficiency Anemia: Hgb 11.5 on admission and 8.9 this morning of discharge. Her Hgb was rechecked in there afternoon and it had improved to 9.2. The patient reported a history of iron deficiency anemia, which is consistent with her ferritin of 5. She reports no bleeding episodes. She described some dark stool, but she recently had a colonoscopy in the past couple years that was normal. She denied any chest pain of shortness of breath. She was given a dose of IV feraheme and discharged on ferrous sulfate 325 mg BID.  AKI: Cr increase from 1.01 to 1.11 on day of discharge. She reported making urine overnight. This was managed with NS at 75 cc/hr.  Osteoarthritis: Pain with knee palpation on exam. Knee x-rays consistent  with severe osteoarthritis bilaterally. Pain treated with tramadol as needed as inpatient.  Parasthesias: Patient complained of "pins and needles" sensation. A1c 6.4, TSH and B12 normal. Etiology was unclear at time of discharge.  Chest pain: Troponins increased to 0.09 and decreased to 0.07. EKG changes were nonspecific. Did not appear to be ACS symptoms. No further workup at this time.   Hypokalemia: K 3.1 on admission and on day of discharge. Her Mg we normal. Addressed with 2 doses of Kdur 40 mEq and lisinopril as above.  Discharge Vitals:   BP 181/80 mmHg  Pulse 78  Temp(Src) 98.4 F (36.9 C) (Oral)  Resp 14  Ht 5\' 3"  (1.6 m)  Wt 196 lb 6.9 oz (89.1 kg)  BMI 34.80 kg/m2  SpO2 100%  LMP 09/15/2013  Discharge Labs:  Results for orders placed or performed during the hospital encounter of 09/19/15 (from the past 24 hour(s))  TSH  Status: None   Collection Time: 09/19/15  6:29 PM  Result Value Ref Range   TSH 2.604 0.350 - 4.500 uIU/mL  Hemoglobin A1c     Status: Abnormal   Collection Time: 09/19/15  6:29 PM  Result Value Ref Range   Hgb A1c MFr Bld 6.1 (H) 4.8 - 5.6 %   Mean Plasma Glucose 128 mg/dL  Troponin I     Status: Abnormal   Collection Time: 09/19/15  6:29 PM  Result Value Ref Range   Troponin I 0.09 (H) <0.031 ng/mL  Ferritin     Status: Abnormal   Collection Time: 09/19/15  6:29 PM  Result Value Ref Range   Ferritin 5 (L) 11 - 307 ng/mL  Vitamin B12     Status: None   Collection Time: 09/19/15  6:29 PM  Result Value Ref Range   Vitamin B-12 429 180 - 914 pg/mL  Hepatitis c antibody (reflex)     Status: None   Collection Time: 09/19/15  6:29 PM  Result Value Ref Range   HCV Ab <0.1 0.0 - 0.9 s/co ratio  Magnesium     Status: None   Collection Time: 09/19/15  6:29 PM  Result Value Ref Range   Magnesium 2.1 1.7 - 2.4 mg/dL  HCV Comment:     Status: None   Collection Time: 09/19/15  6:29 PM  Result Value Ref Range   Comment: Comment   Troponin I      Status: Abnormal   Collection Time: 09/19/15 11:00 PM  Result Value Ref Range   Troponin I 0.07 (H) <0.031 ng/mL  Troponin I     Status: Abnormal   Collection Time: 09/20/15  4:53 AM  Result Value Ref Range   Troponin I 0.07 (H) <0.031 ng/mL  Comprehensive metabolic panel     Status: Abnormal   Collection Time: 09/20/15  4:53 AM  Result Value Ref Range   Sodium 140 135 - 145 mmol/L   Potassium 3.1 (L) 3.5 - 5.1 mmol/L   Chloride 106 101 - 111 mmol/L   CO2 22 22 - 32 mmol/L   Glucose, Bld 101 (H) 65 - 99 mg/dL   BUN 17 6 - 20 mg/dL   Creatinine, Ser 1.10 (H) 0.44 - 1.00 mg/dL   Calcium 8.2 (L) 8.9 - 10.3 mg/dL   Total Protein 6.1 (L) 6.5 - 8.1 g/dL   Albumin 2.8 (L) 3.5 - 5.0 g/dL   AST 13 (L) 15 - 41 U/L   ALT 13 (L) 14 - 54 U/L   Alkaline Phosphatase 51 38 - 126 U/L   Total Bilirubin 0.3 0.3 - 1.2 mg/dL   GFR calc non Af Amer 56 (L) >60 mL/min   GFR calc Af Amer >60 >60 mL/min   Anion gap 12 5 - 15  CBC     Status: Abnormal   Collection Time: 09/20/15  4:53 AM  Result Value Ref Range   WBC 10.2 4.0 - 10.5 K/uL   RBC 3.44 (L) 3.87 - 5.11 MIL/uL   Hemoglobin 8.9 (L) 12.0 - 15.0 g/dL   HCT 27.9 (L) 36.0 - 46.0 %   MCV 81.1 78.0 - 100.0 fL   MCH 25.9 (L) 26.0 - 34.0 pg   MCHC 31.9 30.0 - 36.0 g/dL   RDW 17.5 (H) 11.5 - 15.5 %   Platelets 119 (L) 150 - 400 K/uL  HIV antibody     Status: None   Collection Time: 09/20/15  7:35 AM  Result Value  Ref Range   HIV Screen 4th Generation wRfx Non Reactive Non Reactive  CBC     Status: Abnormal   Collection Time: 09/20/15  2:01 PM  Result Value Ref Range   WBC 9.1 4.0 - 10.5 K/uL   RBC 3.52 (L) 3.87 - 5.11 MIL/uL   Hemoglobin 9.2 (L) 12.0 - 15.0 g/dL   HCT 28.4 (L) 36.0 - 46.0 %   MCV 80.7 78.0 - 100.0 fL   MCH 26.1 26.0 - 34.0 pg   MCHC 32.4 30.0 - 36.0 g/dL   RDW 17.5 (H) 11.5 - 15.5 %   Platelets 122 (L) 150 - 400 K/uL    Signed: Liberty Handy, MD 09/20/2015, 4:20 PM

## 2015-09-20 NOTE — Progress Notes (Signed)
Pt given discharge instructions, prescriptions, and care notes. Pt verbalized understanding AEB no further questions or concerns at this time. IV was discontinued, no redness, pain, or swelling noted at this time. Telemetry discontinued and Centralized Telemetry was notified. Pt left the floor via wheelchair with staff in stable condition. 

## 2015-09-20 NOTE — Care Management Note (Signed)
Case Management Note  Patient Details  Name: [name] MRN: [id number] Date of Birth: 1961-02-28  Subjective/Objective:                  Patient placed in observation for hypertensive crisis. [employer] employee, followed by [facility]. Patient does not have a PCP, provided with [facility] number.    Action/Plan:  No other CM needs identified at this time. ] Expected Discharge Date:                  Expected Discharge Plan:  Home/Self Care  In-House Referral:     Discharge planning Services  CM Consult  Post Acute Care Choice:    Choice offered to:     DME Arranged:    DME Agency:     HH Arranged:    [place] Agency:     Status of Service:  Completed, signed off  Medicare Important Message Given:    Date Medicare IM Given:    Medicare IM give by:    Date Additional Medicare IM Given:    Additional Medicare Important Message give by:     If discussed at [place] of Stay Meetings, dates discussed:    Additional Comments:  Carles Collet, RN 09/20/2015, 1:09 PM

## 2015-09-20 NOTE — Discharge Instructions (Signed)
[name], it was a pleasure taking care of you. It is important that you take your new blood pressure medications, amlodipine and lisinopril, as prescribed to prevent this episode from happening again. Should you develop severe headaches or vision changes again, please seek medical attention. We will see you in the Internal Medicine Clinic on the ground floor of the hospital on January 19 at 10:15 am.

## 2015-09-24 LAB — ALDOSTERONE + RENIN ACTIVITY W/ RATIO
ALDO / PRA RATIO: 0.5 (ref 0.0–30.0)
ALDOSTERONE: 1.4 ng/dL (ref 0.0–30.0)
PRA LC/MS/MS: 2.63 ng/mL/h

## 2015-09-29 ENCOUNTER — Ambulatory Visit: Payer: 59 | Admitting: Pulmonary Disease

## 2015-10-11 ENCOUNTER — Ambulatory Visit: Payer: 59 | Admitting: Pulmonary Disease

## 2015-10-24 ENCOUNTER — Telehealth: Payer: Self-pay | Admitting: Internal Medicine

## 2015-10-24 NOTE — Telephone Encounter (Signed)
APPT REMINDER CALL, LMTCB, IF SHE NEEDS TO CANCEL

## 2015-10-25 ENCOUNTER — Encounter: Payer: Self-pay | Admitting: Internal Medicine

## 2015-10-25 ENCOUNTER — Ambulatory Visit (INDEPENDENT_AMBULATORY_CARE_PROVIDER_SITE_OTHER): Payer: 59 | Admitting: Internal Medicine

## 2015-10-25 VITALS — BP 187/88 | HR 78 | Temp 97.9°F | Ht 63.0 in | Wt 198.7 lb

## 2015-10-25 DIAGNOSIS — D509 Iron deficiency anemia, unspecified: Secondary | ICD-10-CM | POA: Diagnosis not present

## 2015-10-25 DIAGNOSIS — M25562 Pain in left knee: Secondary | ICD-10-CM | POA: Diagnosis not present

## 2015-10-25 DIAGNOSIS — I10 Essential (primary) hypertension: Secondary | ICD-10-CM | POA: Diagnosis not present

## 2015-10-25 DIAGNOSIS — M25561 Pain in right knee: Secondary | ICD-10-CM

## 2015-10-25 DIAGNOSIS — Z0000 Encounter for general adult medical examination without abnormal findings: Secondary | ICD-10-CM

## 2015-10-25 MED ORDER — LISINOPRIL-HYDROCHLOROTHIAZIDE 20-12.5 MG PO TABS: 1.0000 | ORAL_TABLET | Freq: Every day | ORAL | Status: DC

## 2015-10-25 MED ORDER — TRAMADOL HCL 50 MG PO TABS: 50.0000 mg | ORAL_TABLET | Freq: Four times a day (QID) | ORAL | Status: DC | PRN

## 2015-10-25 NOTE — Patient Instructions (Signed)
It was nice seeing you today.  We will be starting you on new medication called HCTZ, it will be combined the Lisinopril which you were taking before. Take one tablet of HCTZ-Lisinopril once a day.  Continue taking the Amlodipine once everyday.  Keep the previous prescription for Lisinopril which you were taking before.  We will see you in 2-3 weeks for a knee injection. For now we will prescribe Tramadol- 50mg  Q8H as needed for now to help with the pain.

## 2015-10-25 NOTE — Progress Notes (Signed)
Patient ID: [name], female   DOB: 01-29-1961, 55 y.o.   MRN: [id number]   Subjective:   Patient ID: [name] female   DOB: 05/19/1961 55 y.o.   MRN: [id number]  HPI: [name] is a 55 y.o. with PMH listed below. Pt is a new patient establishing care here, after a recent hospital admission- 1/9 to 1/10 for HTN emergency. She previously did not have a PCP. 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 She was discharged home on AntiHTn- Lisinopril- 20mg  and Norvasc- 10 mg. Today she feels better. She denies headaches, vision changes, SOB, chest pain or leg swelling.  She also complaints of pain in her bilat knees, worse on her right. Has has imaging done previously and was told she has severe arthritis in her knees for which she takes multiple doses of ibuprofen throughout the day- 400mg  at a time without relief. Pain is worse with weight bearing , without swelling, or redness. No family hx of arthrits.  She otherwise has no other complaints.   Past Medical History  Diagnosis Date  . Hypertension   . Arthritis    Current Outpatient Prescriptions  Medication Sig Dispense Refill  . amLODipine (NORVASC) 10 MG tablet Take 1 tablet (10 mg total) by mouth daily. 30 tablet 3  . ferrous sulfate 325 (65 FE) MG tablet Take 1 tablet (325 mg total) by mouth 2 (two) times daily with a meal. 60 tablet 3  . ibuprofen (ADVIL,MOTRIN) 200 MG tablet Take 200 mg by mouth every 6 (six) hours as needed for moderate pain.    Marland Kitchen lisinopril (PRINIVIL,ZESTRIL) 20 MG tablet Take 1 tablet (20 mg total) by mouth daily. 30 tablet 3   No current facility-administered medications for this visit.   Family History  Problem Relation Age of Onset  . Cancer Mother   . Hypertension Father   . Heart disease Father   . Heart disease Brother    Social History   Social History  . Marital Status: Divorced    Spouse Name: N/A  . Number of Children: N/A  . Years of Education: N/A     Social History Main Topics  . Smoking status: Never Smoker   . Smokeless tobacco: None  . Alcohol Use: No  . Drug Use: No  . Sexual Activity: Not Asked   Other Topics Concern  . None   Social History Narrative   Review of Systems: CONSTITUTIONAL- No Fever, weightloss\ SKIN- No Rash, colour changes or itching. HEAD- No Headache or dizziness. EARS- No vertigo, hearing loss or ear discharge. RESPIRATORY- No Cough or SOB. CARDIAC- No Palpitations, or chest pain. GI- No vomiting, diarrhoea, , abd pain. URINARY- No Frequency,  or dysuria. NEUROLOGIC- No Numbness, syncope, seizures or burning. [facility]- Denies depression or anxiety.  Objective:  Physical Exam: Filed Vitals:   10/25/15 1052  BP: 187/88  Pulse: 78  Temp: 97.9 F (36.6 C)  TempSrc: Oral  Height: 5\' 3"  (1.6 m)  Weight: 198 lb 11.2 oz (90.13 kg)  SpO2: 100%   GENERAL- alert, co-operative, appears as stated age, not in any distress. HEENT- Atraumatic, normocephalic, PERRL, neck supple. CARDIAC- RRR, 2/6 systolic murmur- upper right sternal border, no, rubs or gallops. RESP- Moving equal volumes of air, and , no wheezes or crackles. ABDOMEN- Soft, nontender, bowel sounds present. NEURO- No obvious Cr N abnormality, strenght upper and lower extremities intact, Gait- Normal. EXTREMITIES- pulse 2+, symmetric, no pedal edema, right knee- non tenderness,  no redness or swelling, legt knee- same. SKIN- Warm, dry, No rash or lesion. PSYCH- Normal mood and affect, appropriate thought content and speech.  Assessment & Plan:  The patient's case and plan of care was discussed with attending physician, Dr. Daryll Drown.  Please see problem based charting for assessment and plan.

## 2015-10-26 ENCOUNTER — Other Ambulatory Visit: Payer: Self-pay | Admitting: Internal Medicine

## 2015-10-26 ENCOUNTER — Encounter: Payer: Self-pay | Admitting: Internal Medicine

## 2015-10-26 DIAGNOSIS — I10 Essential (primary) hypertension: Secondary | ICD-10-CM | POA: Insufficient documentation

## 2015-10-26 DIAGNOSIS — D509 Iron deficiency anemia, unspecified: Secondary | ICD-10-CM | POA: Insufficient documentation

## 2015-10-26 DIAGNOSIS — Z0000 Encounter for general adult medical examination without abnormal findings: Secondary | ICD-10-CM | POA: Insufficient documentation

## 2015-10-26 LAB — BMP8+ANION GAP
ANION GAP: 18 mmol/L (ref 10.0–18.0)
BUN/Creatinine Ratio: 14 (ref 9–23)
BUN: 13 mg/dL (ref 6–24)
CALCIUM: 9.3 mg/dL (ref 8.7–10.2)
CHLORIDE: 100 mmol/L (ref 96–106)
CO2: 21 mmol/L (ref 18–29)
Creatinine, Ser: 0.94 mg/dL (ref 0.57–1.00)
GFR calc Af Amer: 80 mL/min/{1.73_m2} (ref >59)
GFR, EST NON AFRICAN AMERICAN: 69 mL/min/{1.73_m2} (ref >59)
GLUCOSE: 97 mg/dL (ref 65–99)
POTASSIUM: 3.4 mmol/L — AB (ref 3.5–5.2)
Sodium: 139 mmol/L (ref 134–144)

## 2015-10-26 LAB — CBC
HEMOGLOBIN: 11.2 g/dL (ref 11.1–15.9)
Hematocrit: 34.8 % (ref 34.0–46.6)
MCH: 27.3 pg (ref 26.6–33.0)
MCHC: 32.2 g/dL (ref 31.5–35.7)
MCV: 85 fL (ref 79–97)
Platelets: 138 10*3/uL — ABNORMAL LOW (ref 150–379)
RBC: 4.11 x10E6/uL (ref 3.77–5.28)
RDW: 18.8 % — ABNORMAL HIGH (ref 12.3–15.4)
WBC: 8.8 10*3/uL (ref 3.4–10.8)

## 2015-10-26 MED ORDER — POTASSIUM CHLORIDE ER 20 MEQ PO TBCR: 40.0000 meq | EXTENDED_RELEASE_TABLET | Freq: Every day | ORAL | Status: DC

## 2015-10-26 NOTE — Assessment & Plan Note (Addendum)
BP Readings from Last 3 Encounters:  10/25/15 187/88  09/20/15 181/80  09/25/13 232/112    Lab Results  Component Value Date   NA 139 10/25/2015   K 3.4* 10/25/2015   CREATININE 0.94 10/25/2015    Assessment: Blood pressure control:  Uncontrolled,  - renin aldosterone ratio- WNL also plasma renin ratio WNL. Other plans: She has been complaint with her Lisinopril 20 and Norvasc- 10mg  - Will start HCTZ- 12.5mg  daily, combo pill- HCTZ 12.5 +lisinopril 20mg  - Bmet today - See in 2 weeks.   Addendum- K low at 3.4, despite Lisinopril and not on diuretic, will call in Wallace- 40mg  daily for now, considering addition of HCTZ will make hypokalemia worse. Might need chronic K supplimentation. Also possibly work up for hypokalemia.

## 2015-10-26 NOTE — Assessment & Plan Note (Addendum)
Hgb 8-9 on admission, with ferritin of 5. Pt was started on Fe supplimentation. She denies dark stools or blood in stools, denies epigastric pain. She has been taking a lot of NSAIDS. She has had a normal colonoscopy- ?when, gotten by her OB/gyn.  Plan- CBC today - AVoid NSAIDs  Addendum- Hgb- Now normal at 11.2

## 2015-10-26 NOTE — Assessment & Plan Note (Signed)
Pt says she has gotten PAP smears, Colonoscopy, and mammogram done, ordered by her OB/gyn. She will prefer to get her PAP smears done over there- by her Ob. She will sign release of information so we can get records at her next visit. Forms were not readily available today, so had to defer.

## 2015-10-26 NOTE — Assessment & Plan Note (Signed)
Pt with bilat knee pain, likely due to OA, with xray findings showing severe OA change in both knee. She has been taking lots of NSAIDS without relief. She had an iron defc anemia, with ferritin of 5 while on admission. Pt says pain is worse when she is standing which creates a challenge for her job wise as she is on her feet most of the day.  Plan- Tramadol 50mg  Q6H for now, #30 pills, told patient this will not be a medication we will be refilling - She will come in for Knee injections subsequently hopefully on her next visit. - Avoid NSAIDS

## 2015-10-27 ENCOUNTER — Telehealth: Payer: Self-pay | Admitting: *Deleted

## 2015-10-27 ENCOUNTER — Telehealth: Payer: Self-pay | Admitting: Internal Medicine

## 2015-10-27 NOTE — Telephone Encounter (Signed)
Pharmacy called for clarification of K+, they will fill [id number] #30 take 2 tabs daily and this will be 15 days, f/u appt is scheduled for 2/28. If you need to speak w/ pharm please call [phone] and ask for [facility]

## 2015-10-27 NOTE — Telephone Encounter (Signed)
Thanks Bonnita Nasuti, that was what I wanted.  Ejiro.

## 2015-10-27 NOTE — Telephone Encounter (Signed)
duplicate

## 2015-10-27 NOTE — Telephone Encounter (Signed)
Rec'd phone call from [place].  Please [employer] @ [phone]

## 2015-10-29 NOTE — Progress Notes (Signed)
Internal Medicine Clinic Attending  Case discussed with Dr. Emokpae at the time of the visit.  We reviewed the resident's history and exam and pertinent patient test results.  I agree with the assessment, diagnosis, and plan of care documented in the resident's note.  

## 2015-11-07 ENCOUNTER — Telehealth: Payer: Self-pay | Admitting: Internal Medicine

## 2015-11-07 NOTE — Telephone Encounter (Signed)
APPT REMINDER CALL, LMTCB IF SHE NEEDS TO CANCEL °

## 2015-11-08 ENCOUNTER — Encounter: Payer: Self-pay | Admitting: Internal Medicine

## 2015-11-08 ENCOUNTER — Ambulatory Visit (INDEPENDENT_AMBULATORY_CARE_PROVIDER_SITE_OTHER): Payer: 59 | Admitting: Internal Medicine

## 2015-11-08 VITALS — BP 168/85 | HR 94 | Temp 98.1°F | Resp 18 | Ht 63.0 in | Wt 194.8 lb

## 2015-11-08 DIAGNOSIS — I10 Essential (primary) hypertension: Secondary | ICD-10-CM | POA: Diagnosis not present

## 2015-11-08 DIAGNOSIS — M170 Bilateral primary osteoarthritis of knee: Secondary | ICD-10-CM

## 2015-11-08 MED ORDER — SPIRONOLACTONE 25 MG PO TABS: 25.0000 mg | ORAL_TABLET | Freq: Every day | ORAL | Status: DC

## 2015-11-08 NOTE — Patient Instructions (Signed)
General Instructions:  I want you to start taking Spironolactone 25mg  a day in addition to your other medications.  Thank you for bringing your medicines today. This helps [place] keep you safe from mistakes.   Progress Toward Treatment Goals:  No flowsheet data found.  Self Care Goals & Plans:  No flowsheet data found.  No flowsheet data found.   Care Management & Community Referrals:  No flowsheet data found.    Knee Injection A knee injection is a procedure to get medicine into your knee joint. Your health care provider puts a needle into the joint and injects medicine with an attached syringe. The injected medicine may relieve the pain, swelling, and stiffness of arthritis. The injected medicine may also help to lubricate and cushion your knee joint. You may need more than one injection. LET [facility] CARE PROVIDER KNOW ABOUT:  Any allergies you have.  All medicines you are taking, including vitamins, herbs, eye drops, creams, and over-the-counter medicines.  Previous problems you or members of your family have had with the use of anesthetics.  Any blood disorders you have.  Previous surgeries you have had.  Any medical conditions you may have. RISKS AND COMPLICATIONS Generally, this is a safe procedure. However, problems may occur, including:  Infection.  Bleeding.  Worsening symptoms.  Damage to the area around your knee.  Allergic reaction to any of the medicines.  Skin reactions from repeated injections. BEFORE THE PROCEDURE  Ask your health care provider about changing or stopping your regular medicines. This is especially important if you are taking diabetes medicines or blood thinners.  Plan to have someone take you home after the procedure. PROCEDURE  You will sit or lie down in a position for your knee to be treated.  The skin over your kneecap will be cleaned with a germ-killing solution (antiseptic).  You will be given a medicine that numbs  the area (local anesthetic). You may feel some stinging.  After your knee becomes numb, you will have a second injection. This is the medicine. This needle is carefully placed between your kneecap and your knee. The medicine is injected into the joint space.  At the end of the procedure, the needle will be removed.  A bandage (dressing) may be placed over the injection site. The procedure may vary among health care providers and hospitals. AFTER THE PROCEDURE  You may have to move your knee through its full range of motion. This helps to get all of the medicine into your joint space.  Your blood pressure, heart rate, breathing rate, and blood oxygen level will be monitored often until the medicines you were given have worn off.  You will be watched to make sure that you do not have a reaction to the injected medicine.   This information is not intended to replace advice given to you by your health care provider. Make sure you discuss any questions you have with your health care provider.   Document Released: 11/18/2006 Document Revised: 09/17/2014 Document Reviewed: 07/07/2014 Elsevier Interactive Patient Education [employer].

## 2015-11-08 NOTE — Progress Notes (Signed)
[facility] Subjective:   Patient ID: [name] female   DOB: 01-20-1961 55 y.o.   MRN: [id number]  HPI: [name] is a 55 y.o. female with a PMH detailed below who presents for 2 week follow up of HTN and knee pain.  Please see problem based charting below for the status of her chronic medical problems.    Past Medical History  Diagnosis Date  . Hypertension   . Arthritis    Current Outpatient Prescriptions  Medication Sig Dispense Refill  . amLODipine (NORVASC) 10 MG tablet Take 1 tablet (10 mg total) by mouth daily. 30 tablet 3  . ferrous sulfate 325 (65 FE) MG tablet Take 1 tablet (325 mg total) by mouth 2 (two) times daily with a meal. 60 tablet 3  . ibuprofen (ADVIL,MOTRIN) 200 MG tablet Take 200 mg by mouth every 6 (six) hours as needed for moderate pain.    Marland Kitchen lisinopril-hydrochlorothiazide (PRINZIDE,ZESTORETIC) 20-12.5 MG tablet Take 1 tablet by mouth daily. 30 tablet 0  . Potassium Chloride ER 20 MEQ TBCR Take 40 mEq by mouth daily. 30 tablet 0  . traMADol (ULTRAM) 50 MG tablet Take 1 tablet (50 mg total) by mouth every 6 (six) hours as needed. 30 tablet 0   No current facility-administered medications for this visit.   Family History  Problem Relation Age of Onset  . Cancer Mother   . Hypertension Father   . Heart disease Father   . Heart disease Brother    Social History   Social History  . Marital Status: Divorced    Spouse Name: N/A  . Number of Children: N/A  . Years of Education: N/A   Social History Main Topics  . Smoking status: Never Smoker   . Smokeless tobacco: None  . Alcohol Use: No  . Drug Use: No  . Sexual Activity: Not Asked   Other Topics Concern  . None   Social History Narrative   Review of Systems: Review of Systems  Constitutional: Negative for fever and weight loss.  Eyes: Negative for blurred vision.  Respiratory: Negative for cough.   Genitourinary: Negative for dysuria.   Musculoskeletal: Positive for joint pain. Negative for falls and neck pain.  Neurological: Negative for dizziness and headaches.  Psychiatric/Behavioral: Negative for depression.     Objective:  Physical Exam: Filed Vitals:   11/08/15 1054  BP: 181/91  Pulse: 94  Temp: 98.1 F (36.7 C)  TempSrc: Oral  Resp: 18  Height: 5\' 3"  (1.6 m)  Weight: 194 lb 12.8 oz (88.361 kg)  SpO2: 100%   Physical Exam  Constitutional: She is well-developed, well-nourished, and in no distress.  Cardiovascular: Normal rate and regular rhythm.   Pulmonary/Chest: Effort normal and breath sounds normal.  Abdominal: Soft. Bowel sounds are normal. There is no tenderness.  Musculoskeletal:       Right knee: She exhibits normal range of motion and no swelling. Tenderness found. Medial joint line and lateral joint line tenderness noted.       Left knee: She exhibits no swelling. Tenderness found. Medial joint line tenderness noted. No lateral joint line tenderness noted.  Nursing note and vitals reviewed.    Assessment & Plan:  Case discussed with Dr. Lynnae January  HTN (hypertension) HPI: She reports strict compliance with Lisinopril HCTZ and amlodpine.  She brings her medications with her today.  She has no complaints  A: Resistant HTN  P: BP uncontrolled despite appropraite therapy.  I doubt maxing  out HCTZ or Lisinopril is going to make much extra benefit,  Her aldosterone and renin levels were normal when checked during her recent hospitalization and the addition of lisinopril has not made things worse so I am unsure of the cause of her resistant hypertension. I will add Spironolactone 25mg  daily to her regimen and have her follow up closely. I discussed low salt diet I injected her knees and asked that she refrain from NSAIDs as this can contribute to elevated blood pressure.  Bilateral knee pain HPI: She has chronic bilateral knee pain, this is worse on the right side.  She takes OTC ibuprofen 2 pills up  to 4 times a day for her knee pain which helps her pain.  Her pain is worsened by walking.  She has about 30 minutes of morning stiffness.  Her pain limits her exercise on the elliptical.  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  She will likely benefit from orthopaedic referral and will need likely need a knee replacement at some point in the near future.    Medications Ordered Meds ordered this encounter  Medications  . spironolactone (ALDACTONE) 25 MG tablet    Sig: Take 1 tablet (25 mg total) by mouth daily.    Dispense:  30 tablet    Refill:  2   Other Orders Orders Placed This Encounter  Procedures  . BMP8+Anion Gap   Follow Up: Return in about 1 week (around 11/15/2015), or if symptoms worsen or fail to improve.

## 2015-11-09 LAB — BMP8+ANION GAP
Anion Gap: 22 mmol/L — ABNORMAL HIGH (ref 10.0–18.0)
BUN/Creatinine Ratio: 14 (ref 9–23)
BUN: 14 mg/dL (ref 6–24)
CALCIUM: 9.5 mg/dL (ref 8.7–10.2)
CHLORIDE: 96 mmol/L (ref 96–106)
CO2: 18 mmol/L (ref 18–29)
Creatinine, Ser: 0.99 mg/dL (ref 0.57–1.00)
GFR calc Af Amer: 75 mL/min/{1.73_m2} (ref >59)
GFR calc non Af Amer: 65 mL/min/{1.73_m2} (ref >59)
GLUCOSE: 94 mg/dL (ref 65–99)
POTASSIUM: 4.2 mmol/L (ref 3.5–5.2)
Sodium: 136 mmol/L (ref 134–144)

## 2015-11-10 NOTE — Assessment & Plan Note (Signed)
HPI: She has chronic bilateral knee pain, this is worse on the right side.  She takes OTC ibuprofen 2 pills up to 4 times a day for her knee pain which helps her pain.  Her pain is worsened by walking.  She has about 30 minutes of morning stiffness.  Her pain limits her exercise on the elliptical.  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  She will likely benefit from orthopaedic referral and will need likely need a knee replacement at some point in the near future.

## 2015-11-10 NOTE — Progress Notes (Signed)
PROCEDURE NOTE  PROCEDURE: left knee joint steroid injection.  PREOPERATIVE DIAGNOSIS: Osteoarthritis of the bilateral knee.  POSTOPERATIVE DIAGNOSIS: Osteoarthritis of the bilateral knee.  PROCEDURE: The patient was apprised of the risks and the benefits of the procedure and informed consent was obtained, as witnessed by Leigh. Time-out procedure was performed, with confirmation of the patient's name, date of birth, and correct identification of the left knee to be injected. The patient's knee was then marked at the appropriate site for injection placement. The knee was sterilely prepped with Betadine. A 40 mg (1 milliliter) solution of Kenalog was drawn up into a 3 mL syringe with a 1 mL of 1% lidocaine. The patient was injected with a 27-gauge needle at the lateral aspect of her left flexed knee. There were no complications. The patient tolerated the procedure well. There was minimal bleeding. The patient was instructed to ice her knee upon leaving clinic and refrain from overuse over the next 3 days. The patient was instructed to go to the emergency room with any usual pain, swelling, or redness occurred in the injected area. The patient was given a followup appointment to evaluate response to the injection to his increased range of motion and reduction of pain.

## 2015-11-10 NOTE — Assessment & Plan Note (Signed)
HPI: She reports strict compliance with Lisinopril HCTZ and amlodpine.  She brings her medications with her today.  She has no complaints  A: Resistant HTN  P: BP uncontrolled despite appropraite therapy.  I doubt maxing out HCTZ or Lisinopril is going to make much extra benefit,  Her aldosterone and renin levels were normal when checked during her recent hospitalization and the addition of lisinopril has not made things worse so I am unsure of the cause of her resistant hypertension. I will add Spironolactone 25mg  daily to her regimen and have her follow up closely. I discussed low salt diet I injected her knees and asked that she refrain from NSAIDs as this can contribute to elevated blood pressure.

## 2015-11-10 NOTE — Progress Notes (Signed)
PROCEDURE NOTE  PROCEDURE: right knee joint steroid injection.  PREOPERATIVE DIAGNOSIS: Osteoarthritis of the bilateral knee.  POSTOPERATIVE DIAGNOSIS: Osteoarthritis of the bilateral knee.  PROCEDURE: The patient was apprised of the risks and the benefits of the procedure and informed consent was obtained, as witnessed by Leigh. Time-out procedure was performed, with confirmation of the patient's name, date of birth, and correct identification of the right knee to be injected. The patient's knee was then marked at the appropriate site for injection placement. The knee was sterilely prepped with Betadine. A 40 mg (1 milliliter) solution of Kenalog was drawn up into a 3 mL syringe with a 1 mL of 1% lidocaine. The patient was injected with a 27-gauge needle at the lateral aspect of her right flexed knee. There were no complications. The patient tolerated the procedure well. There was minimal bleeding. The patient was instructed to ice her knee upon leaving clinic and refrain from overuse over the next 3 days. The patient was instructed to go to the emergency room with any usual pain, swelling, or redness occurred in the injected area. The patient was given a followup appointment to evaluate response to the injection to his increased range of motion and reduction of pain.

## 2015-11-11 NOTE — Progress Notes (Signed)
Internal Medicine Clinic Attending  Case discussed with Dr. Hoffman soon after the resident saw the patient.  We reviewed the resident's history and exam and pertinent patient test results.  I agree with the assessment, diagnosis, and plan of care documented in the resident's note. 

## 2015-11-22 ENCOUNTER — Other Ambulatory Visit: Payer: Self-pay | Admitting: Pharmacist

## 2015-11-22 ENCOUNTER — Ambulatory Visit: Payer: 59 | Admitting: Internal Medicine

## 2015-11-22 DIAGNOSIS — I10 Essential (primary) hypertension: Secondary | ICD-10-CM

## 2015-11-22 MED ORDER — LISINOPRIL-HYDROCHLOROTHIAZIDE 20-12.5 MG PO TABS: 1.0000 | ORAL_TABLET | Freq: Every day | ORAL | Status: DC

## 2015-11-22 NOTE — Telephone Encounter (Signed)
HTN medication review, contacted patient to check on tolerability of lisinopril-HCTZ which was initiated last month. Patient reports no side effects/signs/symptoms. Refill request sent, patient appointment 12/06/15.

## 2015-12-05 ENCOUNTER — Telehealth: Payer: Self-pay | Admitting: Internal Medicine

## 2015-12-05 NOTE — Telephone Encounter (Signed)
APPT. REMINDER CALL, LMTCB °

## 2015-12-06 ENCOUNTER — Encounter: Payer: Self-pay | Admitting: Internal Medicine

## 2015-12-06 ENCOUNTER — Ambulatory Visit (INDEPENDENT_AMBULATORY_CARE_PROVIDER_SITE_OTHER): Payer: 59 | Admitting: Internal Medicine

## 2015-12-06 VITALS — BP 129/72 | HR 76 | Temp 98.5°F | Ht 63.0 in | Wt 194.3 lb

## 2015-12-06 DIAGNOSIS — I10 Essential (primary) hypertension: Secondary | ICD-10-CM

## 2015-12-06 MED ORDER — LOSARTAN POTASSIUM-HCTZ 50-12.5 MG PO TABS: 1.0000 | ORAL_TABLET | Freq: Every day | ORAL | Status: DC

## 2015-12-06 NOTE — Patient Instructions (Signed)
Please start taking losartan-hctz 50-12.5mg  instead of your lisinopril-hctz.   Keep checking your BP at home and bring a log with you (check at rest).  F/up in 1 month.

## 2015-12-06 NOTE — Progress Notes (Signed)
   Subjective:    Patient ID: [name], female    DOB: 07-10-1961, 55 y.o.   MRN: [id number]  HPI  55 yo female with hx of uncontrolled HTN, OA of both knees, iron def anemia is here for HTN follow up.  Went to hospital for HTN emergency. We have seen her in the clinic 2x for BP management since then. Currently on lisinopril-hctz 20-12.5mg  daily, amlodipine 10mg  daily, and also on spironolactone 25mg  daily which was started last visit. Reports compliance with meds. BP well controlled today on recheck. However, she is having a dry cough. No fever, runny nose, sick contacts, SOB, or other symptoms. Just the cough. Started around the same time of starting lisinopril.    Review of Systems  Constitutional: Negative for fever and chills.  HENT: Negative for congestion, postnasal drip, rhinorrhea, sinus pressure and sore throat.   Eyes: Negative for photophobia and visual disturbance.  Respiratory: Positive for cough. Negative for chest tightness, shortness of breath and wheezing.   Cardiovascular: Negative for chest pain, palpitations and leg swelling.  Gastrointestinal: Negative for abdominal pain and abdominal distention.  Endocrine: Negative.   Genitourinary: Negative for dysuria and flank pain.  Skin: Negative for color change and rash.  Neurological: Negative for dizziness, light-headedness and headaches.       Objective:   Physical Exam  Constitutional: She is oriented to person, place, and time. She appears well-developed and well-nourished. No distress.  Pleasant female.   HENT:  Head: Normocephalic and atraumatic.  Eyes: Conjunctivae are normal. Pupils are equal, round, and reactive to light.  Neck: Normal range of motion.  Cardiovascular: Normal rate and regular rhythm.  Exam reveals no gallop and no friction rub.   No murmur heard. Pulmonary/Chest: Effort normal and breath sounds normal. No respiratory distress. She has no wheezes.  Abdominal: Soft. Bowel sounds are  normal. She exhibits no distension. There is no tenderness.  Musculoskeletal: Normal range of motion. She exhibits no edema or tenderness.  Neurological: She is alert and oriented to person, place, and time.  Skin: Skin is warm. She is not diaphoretic.    Filed Vitals:   12/06/15 1020 12/06/15 1036  BP: 163/73 129/72  Pulse: 78 76  Temp: 98.5 F (36.9 C)          Assessment & Plan:  See problem based a&p.

## 2015-12-06 NOTE — Assessment & Plan Note (Signed)
Filed Vitals:   12/06/15 1020 12/06/15 1036  BP: 163/73 129/72  Pulse: 78 76  Temp: 98.5 F (36.9 C)    BP is much better today on recheck on lisinopril-hctz 20-12.5mg  daily, amlodipine 10mg  daily, and spironolactone 25mg  daily. She is having some dry cough which I think is due to AceI.  I will switch her to losartan-hctz 50-12.5mg  daily, continue amlodiping 10mg  daily and spironolactone 25mg  daily. Will check BMET Today to trend her K+ and Crt.   F/up in 1 month for her cough and HTN.

## 2015-12-07 LAB — BASIC METABOLIC PANEL
BUN / CREAT RATIO: 24 — AB (ref 9–23)
BUN: 20 mg/dL (ref 6–24)
CHLORIDE: 96 mmol/L (ref 96–106)
CO2: 23 mmol/L (ref 18–29)
CREATININE: 0.84 mg/dL (ref 0.57–1.00)
Calcium: 9.3 mg/dL (ref 8.7–10.2)
GFR calc non Af Amer: 80 mL/min/{1.73_m2} (ref >59)
GFR, EST AFRICAN AMERICAN: 92 mL/min/{1.73_m2} (ref >59)
GLUCOSE: 95 mg/dL (ref 65–99)
Potassium: 4 mmol/L (ref 3.5–5.2)
SODIUM: 137 mmol/L (ref 134–144)

## 2015-12-07 NOTE — Progress Notes (Signed)
Internal Medicine Clinic Attending  Case discussed with Dr. Ahmed at the time of the visit.  We reviewed the resident's history and exam and pertinent patient test results.  I agree with the assessment, diagnosis, and plan of care documented in the resident's note. 

## 2016-01-02 ENCOUNTER — Telehealth: Payer: Self-pay | Admitting: Internal Medicine

## 2016-01-02 NOTE — Telephone Encounter (Signed)
APPT. REMINDER CALL, LMTCB °

## 2016-01-03 ENCOUNTER — Encounter: Payer: Self-pay | Admitting: Pulmonary Disease

## 2016-01-03 ENCOUNTER — Ambulatory Visit (INDEPENDENT_AMBULATORY_CARE_PROVIDER_SITE_OTHER): Payer: 59 | Admitting: Pulmonary Disease

## 2016-01-03 VITALS — BP 153/79 | HR 81 | Temp 97.7°F | Ht 63.0 in | Wt 201.0 lb

## 2016-01-03 DIAGNOSIS — I10 Essential (primary) hypertension: Secondary | ICD-10-CM

## 2016-01-03 DIAGNOSIS — M170 Bilateral primary osteoarthritis of knee: Secondary | ICD-10-CM | POA: Diagnosis not present

## 2016-01-03 MED ORDER — DICLOFENAC SODIUM 1 % TD GEL: 4.0000 g | Freq: Four times a day (QID) | TRANSDERMAL | Status: DC

## 2016-01-03 MED ORDER — LOSARTAN POTASSIUM-HCTZ 50-12.5 MG PO TABS: 1.0000 | ORAL_TABLET | Freq: Every day | ORAL | Status: DC

## 2016-01-03 MED ORDER — KETOROLAC TROMETHAMINE 60 MG/2ML IM SOLN
60.0000 mg | Freq: Once | INTRAMUSCULAR | Status: AC
  Administered 2016-01-03: 60 mg via INTRAMUSCULAR

## 2016-01-03 NOTE — Assessment & Plan Note (Signed)
Assessment: Doing well on amlopipine, losartan-HCTZ, spironolactone. BP today is 151/76 but she is in a lot of pain from her knees.  Plan: Continue current regimen for now. Follow up in 1 month.

## 2016-01-03 NOTE — Assessment & Plan Note (Signed)
Severe osteoarthritis of bilateral knees on XR.  60mg  IM Toradol administered today Voltaren gel QID Continue OTC pain medications alternating Advil and Tylenol Continue heat Weight loss - put in referral for medical nutrition therapy Referral to orthopedics for further evaluation

## 2016-01-03 NOTE — Patient Instructions (Addendum)
Please continue to take your medications as prescribed Follow up in 1 month, or sooner as needed

## 2016-01-03 NOTE — Progress Notes (Signed)
   Subjective:    Patient ID: TORRIE ULCH, female    DOB: 1961/02/23, 55 y.o.   MRN: [id number]  HPI Ms. BRISEIS HEY is a 55 year old woman with HTN, arthritis presenting for follow up of HTN.  She reports her pain has been going on for a while. She received a steroid injection in both knees at the end of February which helped for a while but then the pain came back. She walks a lot during the day. She takes Advil OTC. Heat also helps the pain.  Review of Systems Constitutional: no fevers/chills Eyes: no vision changes Ears, nose, mouth, throat, and face: no cough Respiratory: no shortness of breath Cardiovascular: no chest pain  Past Medical History  Diagnosis Date  . Hypertension   . Arthritis     Current Outpatient Prescriptions on File Prior to Visit  Medication Sig Dispense Refill  . amLODipine (NORVASC) 10 MG tablet Take 1 tablet (10 mg total) by mouth daily. 30 tablet 3  . ferrous sulfate 325 (65 FE) MG tablet Take 1 tablet (325 mg total) by mouth 2 (two) times daily with a meal. 60 tablet 3  . losartan-hydrochlorothiazide (HYZAAR) 50-12.5 MG tablet Take 1 tablet by mouth daily. 30 tablet 2  . spironolactone (ALDACTONE) 25 MG tablet Take 1 tablet (25 mg total) by mouth daily. 30 tablet 2  . traMADol (ULTRAM) 50 MG tablet Take 1 tablet (50 mg total) by mouth every 6 (six) hours as needed. 30 tablet 0   No current facility-administered medications on file prior to visit.      Objective:   Physical Exam Blood pressure 151/76, pulse 76, temperature 97.7 F (36.5 C), temperature source Oral, height 5\' 3"  (1.6 m), weight 201 lb (91.173 kg), last menstrual period 09/15/2013, SpO2 100 %. General Apperance: NAD HEENT: Normocephalic, atraumatic, anicteric sclera Neck: Supple, trachea midline Lungs: Clear to auscultation bilaterally. No wheezes, rhonchi or rales. Breathing comfortably Heart: Regular rate and rhythm, no murmur/rub/gallop Abdomen: Soft, nontender,  nondistended, no rebound/guarding Extremities: Warm and well perfused, no edema. Tender to palpation throughout the anterior knee joint. No erythema or warmth. Skin: No rashes or lesions Neurologic: Alert and interactive. No gross deficits.    Assessment & Plan:  Please refer to problem based charting.

## 2016-01-04 NOTE — Progress Notes (Signed)
Case discussed with Dr. Dr. Randell Patient at the time of the visit. We reviewed the resident's history and exam and pertinent patient test results. I agree with the assessment, diagnosis, and plan of care documented in the resident's note.

## 2016-01-26 ENCOUNTER — Encounter: Payer: Self-pay | Admitting: Internal Medicine

## 2016-01-30 ENCOUNTER — Telehealth: Payer: Self-pay | Admitting: Internal Medicine

## 2016-01-30 NOTE — Telephone Encounter (Signed)
APT. REMINDER CALL, LMTCB °

## 2016-01-31 ENCOUNTER — Encounter: Payer: 59 | Admitting: Dietician

## 2016-01-31 ENCOUNTER — Ambulatory Visit: Payer: 59 | Admitting: Internal Medicine

## 2016-02-01 ENCOUNTER — Telehealth: Payer: Self-pay

## 2016-02-01 NOTE — Telephone Encounter (Signed)
Complaints of 10/10 knee pain that is radiating to thighs. Reports swelling with no redness. Has tried 800mg . Ibuprofen and heat with little relief. Pain worse when walking and sitting. Offered appointment this afternoon, but pt. Unable to leave work. Does not have a weekday off before her next appointment 02/14/2016. Advised patient to go to ED if pain became severe. Please advise.

## 2016-02-01 NOTE — Telephone Encounter (Signed)
Pt requesting the nurse to call back regarding leg pain.

## 2016-02-13 ENCOUNTER — Telehealth: Payer: Self-pay | Admitting: Internal Medicine

## 2016-02-13 NOTE — Telephone Encounter (Signed)
APT. REMINDER CALL, LMTCB °

## 2016-02-14 ENCOUNTER — Ambulatory Visit (INDEPENDENT_AMBULATORY_CARE_PROVIDER_SITE_OTHER): Payer: 59 | Admitting: Internal Medicine

## 2016-02-14 ENCOUNTER — Encounter: Payer: Self-pay | Admitting: Internal Medicine

## 2016-02-14 VITALS — BP 198/106 | HR 78 | Temp 98.6°F | Ht 63.0 in | Wt 192.7 lb

## 2016-02-14 DIAGNOSIS — M25461 Effusion, right knee: Secondary | ICD-10-CM | POA: Diagnosis not present

## 2016-02-14 DIAGNOSIS — I10 Essential (primary) hypertension: Secondary | ICD-10-CM

## 2016-02-14 DIAGNOSIS — M170 Bilateral primary osteoarthritis of knee: Secondary | ICD-10-CM | POA: Diagnosis not present

## 2016-02-14 DIAGNOSIS — M25462 Effusion, left knee: Secondary | ICD-10-CM | POA: Diagnosis not present

## 2016-02-14 MED ORDER — LOSARTAN POTASSIUM-HCTZ 50-12.5 MG PO TABS: 2.0000 | ORAL_TABLET | Freq: Every day | ORAL | Status: DC

## 2016-02-14 NOTE — Patient Instructions (Signed)
Take losartan-hctz 2 tablets daily (instead of 1).  Follow up in 1 month about your knee pain and BP.

## 2016-02-14 NOTE — Assessment & Plan Note (Signed)
Continues to have severe pain b/l. Performed corticosteroid injection of both knees today. She has severe OA of both knees and may benefit from surgical evaluation. Discussed this with the patient. She will think about it and let us know next time when she follows up in 1 month.

## 2016-02-14 NOTE — Assessment & Plan Note (Signed)
Filed Vitals:   02/14/16 1001  BP: 198/106  Pulse: 78  Temp: 98.6 F (37 C)   BP remains elevated. Some of this is likely 2/2 to knee pain. However, I think she would benefit from increase her medication.  Cont amlodipine 10mg  + spironolactone 25mg  daily Increase losartan-hctz from 50-12.5mg  to 100-25mg  daily.  Check BMET F/up in 1 month.

## 2016-02-14 NOTE — Progress Notes (Addendum)
Subjective:    Patient ID: [name], female    DOB: Jan 11, 1961, 55 y.o.   MRN: [id number]  HPI  55 yo female with HTN, OA, pre-diabetes, presents for follow up for HTN and knee pain  Was seen by Dr. Randell Patient on 01/03/16 for b/l Knee ain. Was given toradol IM along with heat + voltaren + advil + tylenol. States these did not help with the pain. Xray 09/2015 showed severe OA changes L>R.  Has 9/10 knee pain. Did not take her ibuprofen today but had been taking it in the past every 4-6 hours 800mg . Had knee injection in February with relief for 1 month and would like to try that again. Not ready to go to ortho for surgical opinion yet.   HTN: BP remains elevated 198/106. Currently on Losartan-hctz 50-12.5 daily + amlodipine 10 + spironolactone 25mg  daily. Compliant with meds.    Review of Systems  Constitutional: Negative for fever and chills.  HENT: Negative for congestion and sore throat.   Respiratory: Negative for chest tightness, shortness of breath and wheezing.   Cardiovascular: Negative for chest pain, palpitations and leg swelling.  Musculoskeletal: Positive for back pain, joint swelling and arthralgias.  Neurological: Negative for seizures, numbness and headaches.       Objective:   Physical Exam  Constitutional: She is oriented to person, place, and time. She appears well-developed and well-nourished. No distress.  Overweight female, pleasant  HENT:  Head: Normocephalic and atraumatic.  Eyes: Conjunctivae are normal. Right eye exhibits no discharge. Left eye exhibits no discharge.  Neck: Normal range of motion.  Cardiovascular: Normal rate and regular rhythm.  Exam reveals no gallop and no friction rub.   No murmur heard. Pulmonary/Chest: Breath sounds normal. No respiratory distress. She has no wheezes.  Musculoskeletal:  Both knees have some mild effusion on exam. Has tenderness to palpation on the knee joint anterior, medial, and lateral aspects. Limited flexion  due to pain. Normal strength otherwise. Has clicking on both knees.   Neurological: She is alert and oriented to person, place, and time.  Skin: She is not diaphoretic.    Filed Vitals:   02/14/16 1001  BP: 198/106  Pulse: 78  Temp: 98.6 F (37 C)         Assessment & Plan:  See problem based a&p   Performed b/l knee aspiration and injection.   Knee Arthrocentesis with Injection Procedure Note  Diagnosis: left knee pain/ osteoarthritis  Indications: Symptom relief from osteoarthritis  Anesthesia: Lidocaine 1% with epinephrine  Procedure Details   Verbal consent was obtained for the procedure. The joint was prepped with Betadine and a small wheel of anesthetic was injected into the subcutaneous tissue. A 22 gauge needle was inserted into the superior aspect of the joint from a lateral approach. No fluid was removed from the joint and 2 ml 1% lidocaine and 1 ml of triamcinolone (KENALOG) 40mg /ml was then injected into the joint through the same needle. The needle was removed and the area cleansed and dressed.  Complications:  None; patient tolerated the procedure well.  Knee Arthrocentesis with Injection Procedure Note  Diagnosis: right knee pain/ osteoarthritis  Indications: Symptom relief from osteoarthritis  Anesthesia: Lidocaine 1% with epinephrine  Procedure Details   Verbal consent was obtained for the procedure. The joint was prepped with Betadine and a small wheel of anesthetic was injected into the subcutaneous tissue. A 22 gauge needle was inserted into the superior aspect of the joint from a  lateral approach. No fluid was removed from the joint and 2 ml 1% lidocaine and 1 ml of triamcinolone (KENALOG) 40mg /ml was then injected into the joint through the same needle. The needle was removed and the area cleansed and dressed.  Complications:  None; patient tolerated the procedure well.

## 2016-02-15 LAB — BASIC METABOLIC PANEL
BUN/Creatinine Ratio: 15 (ref 9–23)
BUN: 15 mg/dL (ref 6–24)
CALCIUM: 9.5 mg/dL (ref 8.7–10.2)
CO2: 20 mmol/L (ref 18–29)
Chloride: 99 mmol/L (ref 96–106)
Creatinine, Ser: 0.97 mg/dL (ref 0.57–1.00)
GFR, EST AFRICAN AMERICAN: 77 mL/min/{1.73_m2} (ref >59)
GFR, EST NON AFRICAN AMERICAN: 67 mL/min/{1.73_m2} (ref >59)
Glucose: 90 mg/dL (ref 65–99)
POTASSIUM: 3.8 mmol/L (ref 3.5–5.2)
Sodium: 140 mmol/L (ref 134–144)

## 2016-02-15 NOTE — Progress Notes (Signed)
Internal Medicine Clinic Attending  I saw and evaluated the patient.  I personally confirmed the key portions of the history and exam documented by Dr. Genene Churn and I reviewed pertinent patient test results.  The assessment, diagnosis, and plan were formulated together and I agree with the documentation in the resident's note. I was present for the entirety of the procedures and personally performed key portions.

## 2016-03-05 NOTE — Addendum Note (Signed)
Addended by: Hulan Fray on: 03/05/2016 06:57 PM   Modules accepted: Orders

## 2016-03-15 ENCOUNTER — Ambulatory Visit (INDEPENDENT_AMBULATORY_CARE_PROVIDER_SITE_OTHER): Payer: 59 | Admitting: Internal Medicine

## 2016-03-15 VITALS — BP 171/89 | HR 81 | Temp 98.3°F | Ht 63.0 in | Wt 182.6 lb

## 2016-03-15 DIAGNOSIS — I10 Essential (primary) hypertension: Secondary | ICD-10-CM | POA: Diagnosis not present

## 2016-03-15 DIAGNOSIS — M170 Bilateral primary osteoarthritis of knee: Secondary | ICD-10-CM

## 2016-03-15 MED ORDER — SPIRONOLACTONE 25 MG PO TABS: 25.0000 mg | ORAL_TABLET | Freq: Every day | ORAL | Status: DC

## 2016-03-15 MED ORDER — LOSARTAN POTASSIUM-HCTZ 50-12.5 MG PO TABS: 2.0000 | ORAL_TABLET | Freq: Every day | ORAL | Status: DC

## 2016-03-15 MED ORDER — AMLODIPINE BESYLATE 10 MG PO TABS: 10.0000 mg | ORAL_TABLET | Freq: Every day | ORAL | Status: DC

## 2016-03-15 NOTE — Patient Instructions (Signed)
Thank you for coming to see me today. It was a pleasure. Today we talked about:   Blood Pressure: Continue taking your medications as prescribed and follow up in 1 month to see how it is doing with the addition of Amlodipine.  Try to avoid NSAID's if possible along with too much caffeine.  Please follow-up with [place] in 1 month  If you have any questions or concerns, please do not hesitate to call the office at [phone].  Take Care,   Jule Ser, DO

## 2016-03-15 NOTE — Assessment & Plan Note (Signed)
A: Reports improvement in symptoms after injection last month.  Uses NSAIDs regularly  P: - discussed need to reduce NSAIDs - orthopedic referral pending --> pt must call [facility] to schedule - encourage continued activity and weight loss

## 2016-03-15 NOTE — Progress Notes (Signed)
Patient ID: [name], female   DOB: [birth year], 55 y.o.   MRN: [id number]   CC: follow up for BP  HPI:  Ms.Tonya B Molina is a 55 y.o. woman with past medical history as below presenting today for follow up of her HTN and knee arthritis.  Past Medical History  Diagnosis Date  . Hypertension   . Arthritis     Review of Systems:  Review of Systems  Constitutional: Negative for fever and chills.  Cardiovascular: Negative for chest pain and leg swelling.  Gastrointestinal: Negative for abdominal pain.  Musculoskeletal: Positive for joint pain (bilateral knees).  Neurological: Negative for headaches.     Physical Exam:  Filed Vitals:   03/15/16 1109  BP: 171/89  Pulse: 81  Temp: 98.3 F (36.8 C)  TempSrc: Oral  Height: 5\' 3"  (1.6 m)  Weight: 182 lb 9.6 oz (82.827 kg)  SpO2: 100%   Physical Exam  Constitutional: She is oriented to person, place, and time and well-developed, well-nourished, and in no distress.  HENT:  Head: Normocephalic and atraumatic.  Clear TM visualized with cerumen noted in left auditory canal. Clear TM membrane visualized with no cerumen in the right auditory canal.  Eyes: EOM are normal.  Cardiovascular: Normal rate, regular rhythm and normal heart sounds.   Pulmonary/Chest: Effort normal.  Neurological: She is alert and oriented to person, place, and time.  Skin: Skin is warm and dry.  Psychiatric: Mood and affect normal.     Assessment & Plan:   See encounters tab for problem based medical decision making.   Patient discussed with Dr. Evette Doffing   HTN (hypertension) BP Readings from Last 3 Encounters:  03/15/16 171/89  02/14/16 198/106  01/03/16 153/79   A: Her BP is elevated today.  She has been taking her spironolactone and losartan-HCTZ as prescribed but has not been taking amlodipine.  Additionally, she reports frequent NSAID use for arthritis that may be contributing to her difficult HTN.  She reports snoring at  night and with a BMIT > 32, sleep apnea is a possibility as well.  P: - continue current regimen and given refills for amlodipine 10mg  daily, spironolactone 25mg  daily, and losartan-HCTZ 100-25mg  daily - encouraged her to keep a BP log at home 3-4 times per week - discussed trying to decrease her NSAID usage - if BP remains elevated, consider sending for sleep study  - she reports that she does not consume an excess amount of caffeine - RTC 1 month  Osteoarthritis of both knees A: Reports improvement in symptoms after injection last month.  Uses NSAIDs regularly  P: - discussed need to reduce NSAIDs - orthopedic referral pending --> pt must call [facility] to schedule - encourage continued activity and weight loss

## 2016-03-15 NOTE — Assessment & Plan Note (Signed)
BP Readings from Last 3 Encounters:  03/15/16 171/89  02/14/16 198/106  01/03/16 153/79   A: Her BP is elevated today.  She has been taking her spironolactone and losartan-HCTZ as prescribed but has not been taking amlodipine.  Additionally, she reports frequent NSAID use for arthritis that may be contributing to her difficult HTN.  She reports snoring at night and with a BMIT > 32, sleep apnea is a possibility as well.  P: - continue current regimen and given refills for amlodipine 10mg  daily, spironolactone 25mg  daily, and losartan-HCTZ 100-25mg  daily - encouraged her to keep a BP log at home 3-4 times per week - discussed trying to decrease her NSAID usage - if BP remains elevated, consider sending for sleep study  - she reports that she does not consume an excess amount of caffeine - RTC 1 month

## 2016-03-16 NOTE — Progress Notes (Signed)
Internal Medicine Clinic Attending  Case discussed with Dr. Wallace at the time of the visit.  We reviewed the resident's history and exam and pertinent patient test results.  I agree with the assessment, diagnosis, and plan of care documented in the resident's note.  

## 2017-02-15 ENCOUNTER — Encounter: Payer: Self-pay | Admitting: *Deleted

## 2017-07-03 ENCOUNTER — Encounter: Payer: Self-pay | Admitting: Internal Medicine

## 2018-01-10 IMAGING — CR DG KNEE COMPLETE 4+V*L*
4 series · 4 of 4 positions shown · non-contrast
Comparison: None in PACs

CLINICAL DATA: Bowlegged, chronic bilateral knee pain

EXAM:
RIGHT KNEE - COMPLETE 4+ VIEW; LEFT KNEE - COMPLETE 4+ VIEW

[knee ap]
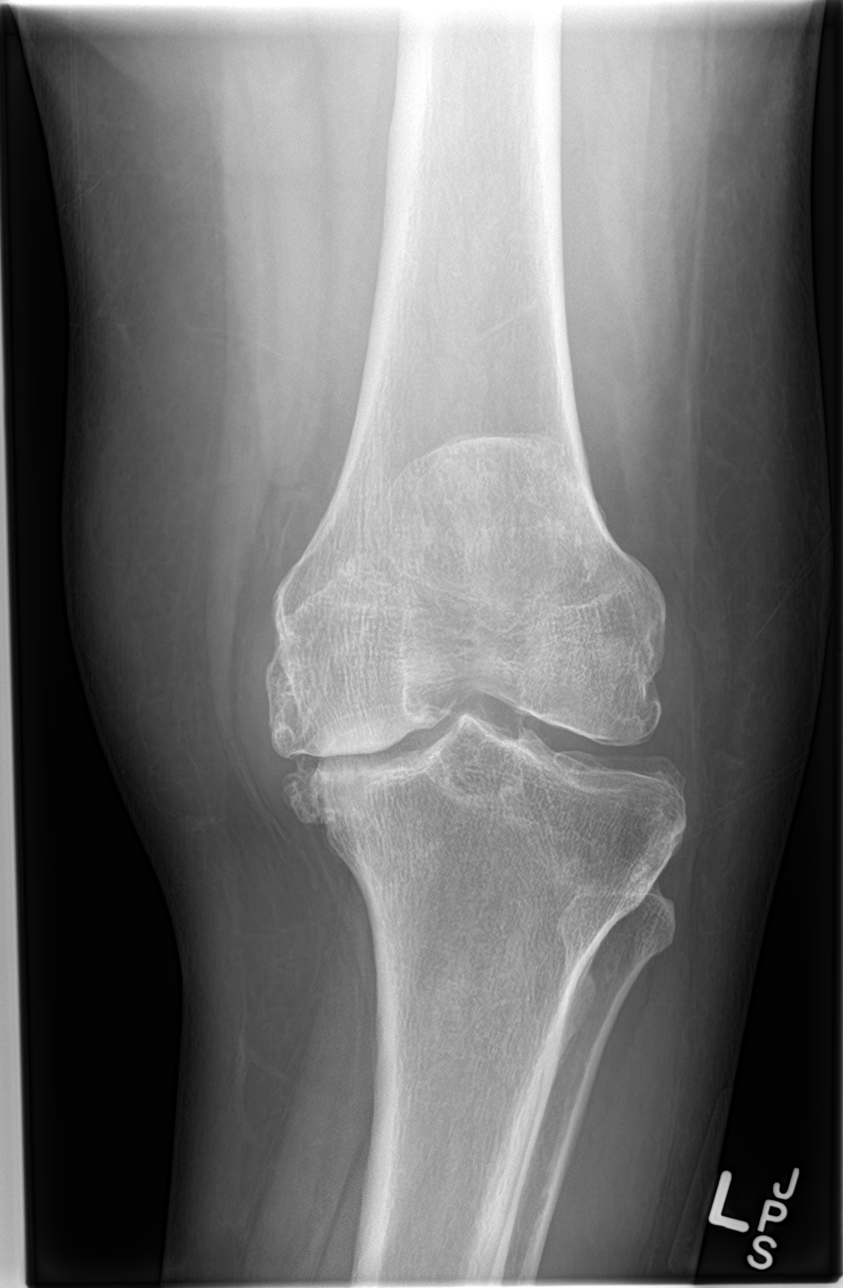

[knee lat]
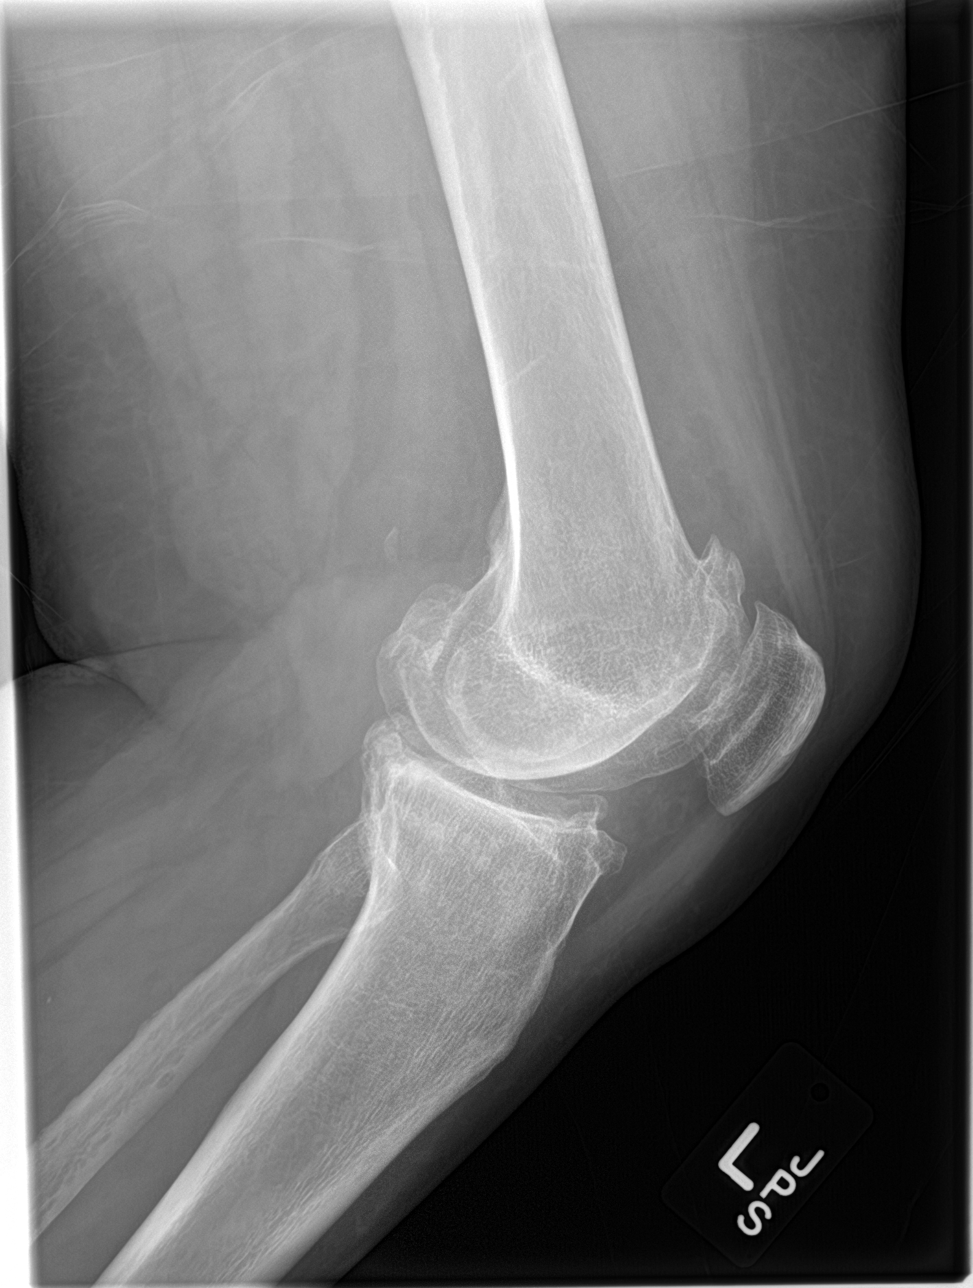

[knee obl (1 of 2)]
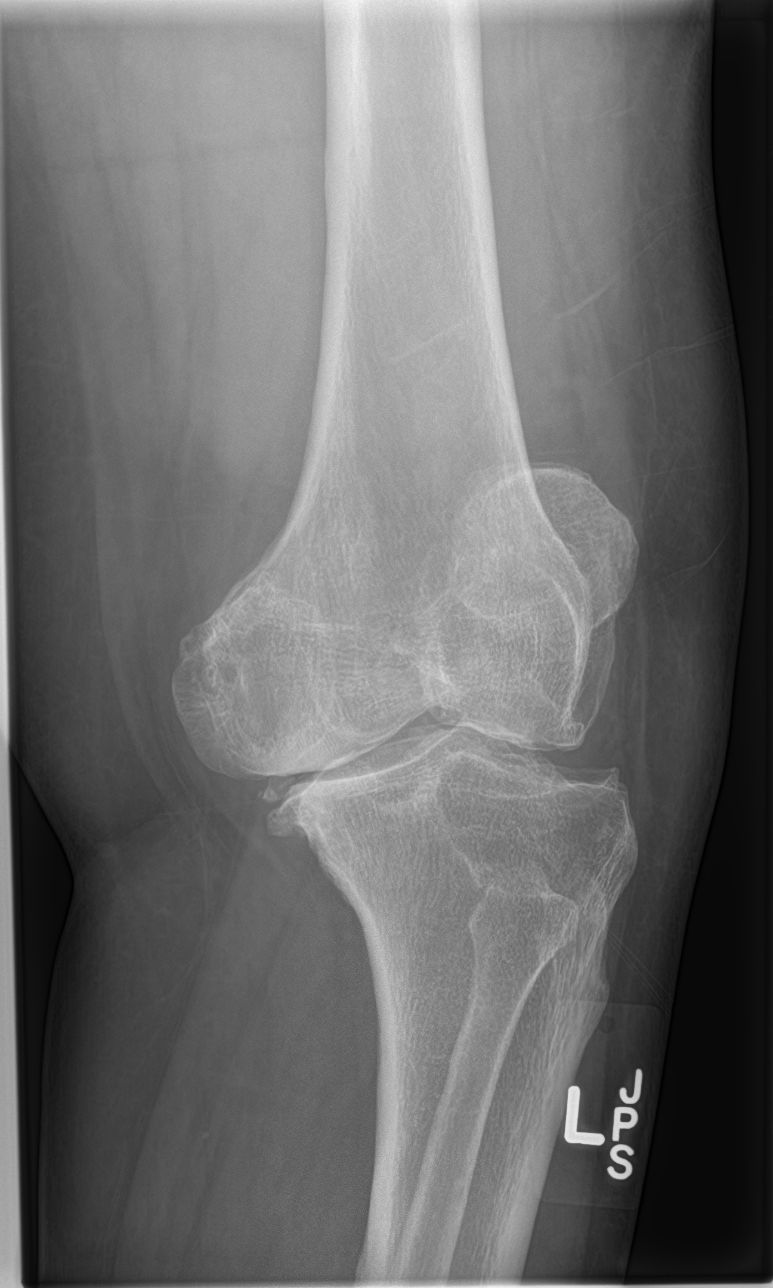

[knee obl (2 of 2)]
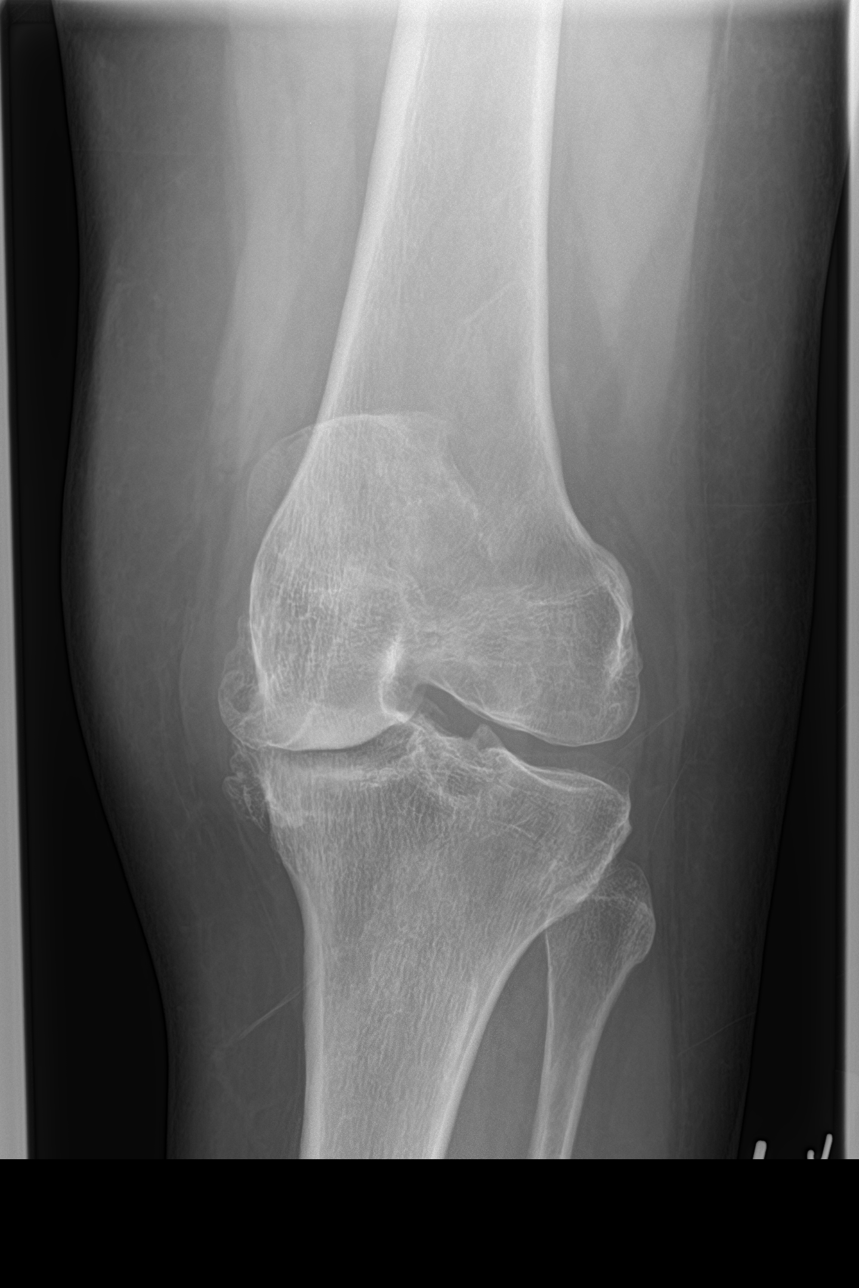

[4 of 4 positions shown; findings below may reference images not displayed]

FINDINGS: The bones of the knees are adequately mineralized. There is at
junction at both knee joints. There is high-grade joint space loss
of the medial compartments bilaterally. There are osteophytes
arising from the articular margins of the medial tibial plateaus and
adjacent peel femoral condyles. The lateral joint compartments are
well maintained. There is mild beaking of the tibial spines.

There is joint space loss of the patellofemoral compartments
bilaterally. Large osteophytes arise from the articular margins of
the patella and from the superior articular margins of the femoral
condyles. The proximal fibulas are intact.
IMPRESSION: Severe osteoarthritic change of the medial joint compartments
slightly greater on the left than on the right with moderate
osteoarthritic changes of the patellofemoral compartments. There is
abduction at both knee joints giving the bow leg appearance.

## 2020-04-26 ENCOUNTER — Encounter (HOSPITAL_COMMUNITY): Payer: Self-pay | Admitting: Emergency Medicine

## 2020-04-26 ENCOUNTER — Inpatient Hospital Stay (HOSPITAL_COMMUNITY)
Admission: EM | Admit: 2020-04-26 | Discharge: 2020-05-03 | DRG: 064 | Disposition: A | Discharge status: Rehabilitation Facility | Payer: 59 | Attending: Family Medicine | Admitting: Family Medicine

## 2020-04-26 DIAGNOSIS — D696 Thrombocytopenia, unspecified: Secondary | ICD-10-CM

## 2020-04-26 DIAGNOSIS — G8194 Hemiplegia, unspecified affecting left nondominant side: Secondary | ICD-10-CM | POA: Diagnosis present

## 2020-04-26 DIAGNOSIS — R29707 NIHSS score 7: Secondary | ICD-10-CM | POA: Diagnosis present

## 2020-04-26 DIAGNOSIS — R4781 Slurred speech: Secondary | ICD-10-CM | POA: Diagnosis not present

## 2020-04-26 DIAGNOSIS — I130 Hypertensive heart and chronic kidney disease with heart failure and stage 1 through stage 4 chronic kidney disease, or unspecified chronic kidney disease: Secondary | ICD-10-CM | POA: Diagnosis present

## 2020-04-26 DIAGNOSIS — I5042 Chronic combined systolic (congestive) and diastolic (congestive) heart failure: Secondary | ICD-10-CM

## 2020-04-26 DIAGNOSIS — G8929 Other chronic pain: Secondary | ICD-10-CM | POA: Diagnosis present

## 2020-04-26 DIAGNOSIS — Z66 Do not resuscitate: Secondary | ICD-10-CM | POA: Diagnosis not present

## 2020-04-26 DIAGNOSIS — I5043 Acute on chronic combined systolic (congestive) and diastolic (congestive) heart failure: Secondary | ICD-10-CM | POA: Diagnosis present

## 2020-04-26 DIAGNOSIS — I63521 Cerebral infarction due to unspecified occlusion or stenosis of right anterior cerebral artery: Principal | ICD-10-CM | POA: Diagnosis present

## 2020-04-26 DIAGNOSIS — E876 Hypokalemia: Secondary | ICD-10-CM | POA: Diagnosis present

## 2020-04-26 DIAGNOSIS — Z791 Long term (current) use of non-steroidal anti-inflammatories (NSAID): Secondary | ICD-10-CM

## 2020-04-26 DIAGNOSIS — N1832 Chronic kidney disease, stage 3b: Secondary | ICD-10-CM

## 2020-04-26 DIAGNOSIS — R29810 Facial weakness: Secondary | ICD-10-CM | POA: Diagnosis present

## 2020-04-26 DIAGNOSIS — R471 Dysarthria and anarthria: Secondary | ICD-10-CM | POA: Diagnosis present

## 2020-04-26 DIAGNOSIS — I739 Peripheral vascular disease, unspecified: Secondary | ICD-10-CM | POA: Diagnosis present

## 2020-04-26 DIAGNOSIS — I161 Hypertensive emergency: Secondary | ICD-10-CM

## 2020-04-26 DIAGNOSIS — N289 Disorder of kidney and ureter, unspecified: Secondary | ICD-10-CM

## 2020-04-26 DIAGNOSIS — I10 Essential (primary) hypertension: Secondary | ICD-10-CM | POA: Diagnosis present

## 2020-04-26 DIAGNOSIS — D631 Anemia in chronic kidney disease: Secondary | ICD-10-CM | POA: Diagnosis present

## 2020-04-26 DIAGNOSIS — N179 Acute kidney failure, unspecified: Secondary | ICD-10-CM | POA: Diagnosis present

## 2020-04-26 DIAGNOSIS — I674 Hypertensive encephalopathy: Secondary | ICD-10-CM | POA: Diagnosis present

## 2020-04-26 DIAGNOSIS — I639 Cerebral infarction, unspecified: Secondary | ICD-10-CM | POA: Diagnosis present

## 2020-04-26 DIAGNOSIS — Z20822 Contact with and (suspected) exposure to covid-19: Secondary | ICD-10-CM | POA: Diagnosis present

## 2020-04-26 DIAGNOSIS — R7303 Prediabetes: Secondary | ICD-10-CM

## 2020-04-26 DIAGNOSIS — D509 Iron deficiency anemia, unspecified: Secondary | ICD-10-CM | POA: Diagnosis present

## 2020-04-26 DIAGNOSIS — Z8249 Family history of ischemic heart disease and other diseases of the circulatory system: Secondary | ICD-10-CM

## 2020-04-26 DIAGNOSIS — H538 Other visual disturbances: Secondary | ICD-10-CM | POA: Diagnosis present

## 2020-04-26 DIAGNOSIS — E785 Hyperlipidemia, unspecified: Secondary | ICD-10-CM | POA: Diagnosis present

## 2020-04-26 LAB — BASIC METABOLIC PANEL
Anion gap: 12 (ref 5–15)
BUN: 26 mg/dL — ABNORMAL HIGH (ref 6–20)
CO2: 24 mmol/L (ref 22–32)
Calcium: 9.6 mg/dL (ref 8.9–10.3)
Chloride: 105 mmol/L (ref 98–111)
Creatinine, Ser: 1.81 mg/dL — ABNORMAL HIGH (ref 0.44–1.00)
GFR calc Af Amer: 35 mL/min — ABNORMAL LOW (ref >60)
GFR calc non Af Amer: 30 mL/min — ABNORMAL LOW (ref >60)
Glucose, Bld: 119 mg/dL — ABNORMAL HIGH (ref 70–99)
Potassium: 2.9 mmol/L — ABNORMAL LOW (ref 3.5–5.1)
Sodium: 141 mmol/L (ref 135–145)

## 2020-04-26 LAB — CBC
HCT: 39 % (ref 36.0–46.0)
Hemoglobin: 12.5 g/dL (ref 12.0–15.0)
MCH: 28.5 pg (ref 26.0–34.0)
MCHC: 32.1 g/dL (ref 30.0–36.0)
MCV: 89 fL (ref 80.0–100.0)
Platelets: 143 10*3/uL — ABNORMAL LOW (ref 150–400)
RBC: 4.38 MIL/uL (ref 3.87–5.11)
RDW: 14.2 % (ref 11.5–15.5)
WBC: 9.9 10*3/uL (ref 4.0–10.5)
nRBC: 0 % (ref 0.0–0.2)

## 2020-04-26 NOTE — ED Triage Notes (Signed)
Patient reports she checked her BP earlier and it was elevated, came here for eval. Patient denies CP/SOB. Patient reports blurred vision but states her vision is always blurred. States no prior history of hypertension

## 2020-04-27 ENCOUNTER — Emergency Department (HOSPITAL_COMMUNITY): Payer: 59

## 2020-04-27 ENCOUNTER — Observation Stay (HOSPITAL_COMMUNITY): Payer: 59

## 2020-04-27 ENCOUNTER — Encounter (HOSPITAL_COMMUNITY): Payer: Self-pay | Admitting: Internal Medicine

## 2020-04-27 ENCOUNTER — Inpatient Hospital Stay (HOSPITAL_COMMUNITY): Payer: 59

## 2020-04-27 ENCOUNTER — Encounter: Payer: Self-pay | Admitting: Internal Medicine

## 2020-04-27 ENCOUNTER — Other Ambulatory Visit: Payer: Self-pay

## 2020-04-27 DIAGNOSIS — L89159 Pressure ulcer of sacral region, unspecified stage: Secondary | ICD-10-CM | POA: Diagnosis not present

## 2020-04-27 DIAGNOSIS — D696 Thrombocytopenia, unspecified: Secondary | ICD-10-CM | POA: Diagnosis present

## 2020-04-27 DIAGNOSIS — Z20822 Contact with and (suspected) exposure to covid-19: Secondary | ICD-10-CM | POA: Diagnosis present

## 2020-04-27 DIAGNOSIS — E876 Hypokalemia: Secondary | ICD-10-CM

## 2020-04-27 DIAGNOSIS — R29810 Facial weakness: Secondary | ICD-10-CM | POA: Diagnosis present

## 2020-04-27 DIAGNOSIS — D509 Iron deficiency anemia, unspecified: Secondary | ICD-10-CM | POA: Diagnosis present

## 2020-04-27 DIAGNOSIS — I6300 Cerebral infarction due to thrombosis of unspecified precerebral artery: Secondary | ICD-10-CM | POA: Diagnosis not present

## 2020-04-27 DIAGNOSIS — I10 Essential (primary) hypertension: Secondary | ICD-10-CM | POA: Diagnosis not present

## 2020-04-27 DIAGNOSIS — N289 Disorder of kidney and ureter, unspecified: Secondary | ICD-10-CM | POA: Diagnosis not present

## 2020-04-27 DIAGNOSIS — I639 Cerebral infarction, unspecified: Secondary | ICD-10-CM | POA: Diagnosis not present

## 2020-04-27 DIAGNOSIS — I6389 Other cerebral infarction: Secondary | ICD-10-CM | POA: Diagnosis not present

## 2020-04-27 DIAGNOSIS — N179 Acute kidney failure, unspecified: Secondary | ICD-10-CM | POA: Diagnosis not present

## 2020-04-27 DIAGNOSIS — I63012 Cerebral infarction due to thrombosis of left vertebral artery: Secondary | ICD-10-CM | POA: Diagnosis not present

## 2020-04-27 DIAGNOSIS — D631 Anemia in chronic kidney disease: Secondary | ICD-10-CM | POA: Diagnosis present

## 2020-04-27 DIAGNOSIS — R4781 Slurred speech: Secondary | ICD-10-CM | POA: Diagnosis present

## 2020-04-27 DIAGNOSIS — K5901 Slow transit constipation: Secondary | ICD-10-CM | POA: Diagnosis not present

## 2020-04-27 DIAGNOSIS — R7303 Prediabetes: Secondary | ICD-10-CM | POA: Diagnosis present

## 2020-04-27 DIAGNOSIS — I69391 Dysphagia following cerebral infarction: Secondary | ICD-10-CM | POA: Diagnosis not present

## 2020-04-27 DIAGNOSIS — R0989 Other specified symptoms and signs involving the circulatory and respiratory systems: Secondary | ICD-10-CM | POA: Diagnosis not present

## 2020-04-27 DIAGNOSIS — G8929 Other chronic pain: Secondary | ICD-10-CM | POA: Diagnosis present

## 2020-04-27 DIAGNOSIS — I674 Hypertensive encephalopathy: Secondary | ICD-10-CM | POA: Diagnosis present

## 2020-04-27 DIAGNOSIS — H538 Other visual disturbances: Secondary | ICD-10-CM | POA: Diagnosis present

## 2020-04-27 DIAGNOSIS — I63521 Cerebral infarction due to unspecified occlusion or stenosis of right anterior cerebral artery: Secondary | ICD-10-CM | POA: Diagnosis present

## 2020-04-27 DIAGNOSIS — Z8249 Family history of ischemic heart disease and other diseases of the circulatory system: Secondary | ICD-10-CM | POA: Diagnosis not present

## 2020-04-27 DIAGNOSIS — G8194 Hemiplegia, unspecified affecting left nondominant side: Secondary | ICD-10-CM | POA: Diagnosis present

## 2020-04-27 DIAGNOSIS — R5383 Other fatigue: Secondary | ICD-10-CM | POA: Diagnosis not present

## 2020-04-27 DIAGNOSIS — R52 Pain, unspecified: Secondary | ICD-10-CM | POA: Diagnosis not present

## 2020-04-27 DIAGNOSIS — I161 Hypertensive emergency: Secondary | ICD-10-CM | POA: Diagnosis present

## 2020-04-27 DIAGNOSIS — Z66 Do not resuscitate: Secondary | ICD-10-CM | POA: Diagnosis not present

## 2020-04-27 DIAGNOSIS — D473 Essential (hemorrhagic) thrombocythemia: Secondary | ICD-10-CM | POA: Diagnosis not present

## 2020-04-27 DIAGNOSIS — I5041 Acute combined systolic (congestive) and diastolic (congestive) heart failure: Secondary | ICD-10-CM | POA: Diagnosis not present

## 2020-04-27 DIAGNOSIS — D62 Acute posthemorrhagic anemia: Secondary | ICD-10-CM | POA: Diagnosis not present

## 2020-04-27 DIAGNOSIS — R1032 Left lower quadrant pain: Secondary | ICD-10-CM | POA: Diagnosis not present

## 2020-04-27 DIAGNOSIS — E785 Hyperlipidemia, unspecified: Secondary | ICD-10-CM | POA: Diagnosis present

## 2020-04-27 DIAGNOSIS — D72829 Elevated white blood cell count, unspecified: Secondary | ICD-10-CM | POA: Diagnosis not present

## 2020-04-27 DIAGNOSIS — I69354 Hemiplegia and hemiparesis following cerebral infarction affecting left non-dominant side: Secondary | ICD-10-CM | POA: Diagnosis not present

## 2020-04-27 DIAGNOSIS — N1832 Chronic kidney disease, stage 3b: Secondary | ICD-10-CM | POA: Diagnosis present

## 2020-04-27 DIAGNOSIS — I739 Peripheral vascular disease, unspecified: Secondary | ICD-10-CM | POA: Diagnosis present

## 2020-04-27 DIAGNOSIS — I130 Hypertensive heart and chronic kidney disease with heart failure and stage 1 through stage 4 chronic kidney disease, or unspecified chronic kidney disease: Secondary | ICD-10-CM | POA: Diagnosis present

## 2020-04-27 DIAGNOSIS — R29707 NIHSS score 7: Secondary | ICD-10-CM | POA: Diagnosis present

## 2020-04-27 DIAGNOSIS — G8110 Spastic hemiplegia affecting unspecified side: Secondary | ICD-10-CM | POA: Diagnosis not present

## 2020-04-27 DIAGNOSIS — I5043 Acute on chronic combined systolic (congestive) and diastolic (congestive) heart failure: Secondary | ICD-10-CM | POA: Diagnosis present

## 2020-04-27 DIAGNOSIS — R471 Dysarthria and anarthria: Secondary | ICD-10-CM | POA: Diagnosis present

## 2020-04-27 DIAGNOSIS — Z791 Long term (current) use of non-steroidal anti-inflammatories (NSAID): Secondary | ICD-10-CM | POA: Diagnosis not present

## 2020-04-27 LAB — I-STAT BETA HCG BLOOD, ED (MC, WL, AP ONLY): I-stat hCG, quantitative: <5 m[IU]/mL (ref <5)

## 2020-04-27 LAB — URINALYSIS, ROUTINE W REFLEX MICROSCOPIC
Bilirubin Urine: NEGATIVE
Glucose, UA: NEGATIVE mg/dL
Hgb urine dipstick: NEGATIVE
Ketones, ur: NEGATIVE mg/dL
Leukocytes,Ua: NEGATIVE
Nitrite: NEGATIVE
Protein, ur: NEGATIVE mg/dL
Specific Gravity, Urine: 1.016 (ref 1.005–1.030)
pH: 7 (ref 5.0–8.0)

## 2020-04-27 LAB — DIFFERENTIAL
Abs Immature Granulocytes: 0.02 10*3/uL (ref 0.00–0.07)
Basophils Absolute: 0.1 10*3/uL (ref 0.0–0.1)
Basophils Relative: 1 %
Eosinophils Absolute: 0.1 10*3/uL (ref 0.0–0.5)
Eosinophils Relative: 1 %
Immature Granulocytes: 0 %
Lymphocytes Relative: 16 %
Lymphs Abs: 1.6 10*3/uL (ref 0.7–4.0)
Monocytes Absolute: 0.8 10*3/uL (ref 0.1–1.0)
Monocytes Relative: 8 %
Neutro Abs: 7.2 10*3/uL (ref 1.7–7.7)
Neutrophils Relative %: 74 %

## 2020-04-27 LAB — ECHOCARDIOGRAM COMPLETE
Area-P 1/2: 3.37 cm2
Height: 64 in
S' Lateral: 3.6 cm
Weight: 2560 oz

## 2020-04-27 LAB — I-STAT CHEM 8, ED
BUN: 28 mg/dL — ABNORMAL HIGH (ref 6–20)
Calcium, Ion: 1.16 mmol/L (ref 1.15–1.40)
Chloride: 103 mmol/L (ref 98–111)
Creatinine, Ser: 1.8 mg/dL — ABNORMAL HIGH (ref 0.44–1.00)
Glucose, Bld: 113 mg/dL — ABNORMAL HIGH (ref 70–99)
HCT: 39 % (ref 36.0–46.0)
Hemoglobin: 13.3 g/dL (ref 12.0–15.0)
Potassium: 3.1 mmol/L — ABNORMAL LOW (ref 3.5–5.1)
Sodium: 142 mmol/L (ref 135–145)
TCO2: 26 mmol/L (ref 22–32)

## 2020-04-27 LAB — PROTIME-INR
INR: 1.1 (ref 0.8–1.2)
Prothrombin Time: 13.3 seconds (ref 11.4–15.2)

## 2020-04-27 LAB — ETHANOL: Alcohol, Ethyl (B): <10 mg/dL (ref <10)

## 2020-04-27 LAB — RAPID URINE DRUG SCREEN, HOSP PERFORMED
Amphetamines: NOT DETECTED
Barbiturates: NOT DETECTED
Benzodiazepines: NOT DETECTED
Cocaine: NOT DETECTED
Opiates: NOT DETECTED
Tetrahydrocannabinol: NOT DETECTED

## 2020-04-27 LAB — APTT: aPTT: 33 seconds (ref 24–36)

## 2020-04-27 LAB — HIV ANTIBODY (ROUTINE TESTING W REFLEX): HIV Screen 4th Generation wRfx: NONREACTIVE

## 2020-04-27 LAB — TSH: TSH: 1.209 u[IU]/mL (ref 0.350–4.500)

## 2020-04-27 LAB — SARS CORONAVIRUS 2 BY RT PCR (HOSPITAL ORDER, PERFORMED IN ~~LOC~~ HOSPITAL LAB): SARS Coronavirus 2: NEGATIVE

## 2020-04-27 MED ORDER — LISINOPRIL 20 MG PO TABS
20.0000 mg | ORAL_TABLET | Freq: Once | ORAL | Status: AC
  Administered 2020-04-27: 20 mg via ORAL
  Filled 2020-04-27: qty 1

## 2020-04-27 MED ORDER — ASPIRIN 300 MG RE SUPP
300.0000 mg | Freq: Every day | RECTAL | Status: DC
  Administered 2020-04-29: 300 mg via RECTAL
  Filled 2020-04-27: qty 1

## 2020-04-27 MED ORDER — SODIUM CHLORIDE 0.9 % IV SOLN: INTRAVENOUS | Status: DC

## 2020-04-27 MED ORDER — HYDRALAZINE HCL 20 MG/ML IJ SOLN
10.0000 mg | Freq: Once | INTRAMUSCULAR | Status: AC
  Administered 2020-04-27: 10 mg via INTRAVENOUS
  Filled 2020-04-27: qty 1

## 2020-04-27 MED ORDER — ACETAMINOPHEN 325 MG PO TABS: 650.0000 mg | ORAL_TABLET | ORAL | Status: DC | PRN

## 2020-04-27 MED ORDER — POTASSIUM CHLORIDE 10 MEQ/100ML IV SOLN
10.0000 meq | INTRAVENOUS | Status: AC
  Administered 2020-04-27 (×2): 10 meq via INTRAVENOUS
  Filled 2020-04-27 (×2): qty 100

## 2020-04-27 MED ORDER — SENNOSIDES-DOCUSATE SODIUM 8.6-50 MG PO TABS: 1.0000 | ORAL_TABLET | Freq: Every evening | ORAL | Status: DC | PRN

## 2020-04-27 MED ORDER — AMLODIPINE BESYLATE 5 MG PO TABS
10.0000 mg | ORAL_TABLET | Freq: Once | ORAL | Status: AC
  Administered 2020-04-27: 10 mg via ORAL
  Filled 2020-04-27: qty 2

## 2020-04-27 MED ORDER — ENOXAPARIN SODIUM 40 MG/0.4ML ~~LOC~~ SOLN
40.0000 mg | SUBCUTANEOUS | Status: DC
  Administered 2020-04-27 – 2020-05-03 (×7): 40 mg via SUBCUTANEOUS
  Filled 2020-04-27 (×7): qty 0.4

## 2020-04-27 MED ORDER — STROKE: EARLY STAGES OF RECOVERY BOOK
Status: AC
  Filled 2020-04-27: qty 1

## 2020-04-27 MED ORDER — ACETAMINOPHEN 160 MG/5ML PO SOLN: 650.0000 mg | ORAL | Status: DC | PRN

## 2020-04-27 MED ORDER — CLOPIDOGREL BISULFATE 75 MG PO TABS
75.0000 mg | ORAL_TABLET | Freq: Every day | ORAL | Status: DC
  Administered 2020-04-27 – 2020-05-03 (×7): 75 mg via ORAL
  Filled 2020-04-27 (×7): qty 1

## 2020-04-27 MED ORDER — LORAZEPAM 1 MG PO TABS: 1.0000 mg | ORAL_TABLET | Freq: Once | ORAL | Status: DC

## 2020-04-27 MED ORDER — SODIUM CHLORIDE 0.9 % IV BOLUS
1000.0000 mL | Freq: Once | INTRAVENOUS | Status: AC
  Administered 2020-04-27: 1000 mL via INTRAVENOUS

## 2020-04-27 MED ORDER — LORAZEPAM 2 MG/ML IJ SOLN
1.0000 mg | Freq: Once | INTRAMUSCULAR | Status: AC
  Administered 2020-04-27: 1 mg via INTRAVENOUS
  Filled 2020-04-27: qty 1

## 2020-04-27 MED ORDER — STROKE: EARLY STAGES OF RECOVERY BOOK
Freq: Once | Status: AC
  Filled 2020-04-27 (×2): qty 1

## 2020-04-27 MED ORDER — POTASSIUM CHLORIDE IN NACL 20-0.9 MEQ/L-% IV SOLN
INTRAVENOUS | Status: DC
  Filled 2020-04-27 (×7): qty 1000

## 2020-04-27 MED ORDER — ASPIRIN 325 MG PO TABS
325.0000 mg | ORAL_TABLET | Freq: Every day | ORAL | Status: DC
  Administered 2020-04-27 – 2020-05-03 (×6): 325 mg via ORAL
  Filled 2020-04-27 (×7): qty 1

## 2020-04-27 MED ORDER — IOHEXOL 350 MG/ML SOLN
80.0000 mL | Freq: Once | INTRAVENOUS | Status: AC | PRN
  Administered 2020-04-27: 60 mL via INTRAVENOUS

## 2020-04-27 MED ORDER — ATORVASTATIN CALCIUM 40 MG PO TABS
40.0000 mg | ORAL_TABLET | Freq: Every day | ORAL | Status: DC
  Administered 2020-04-27 – 2020-05-03 (×7): 40 mg via ORAL
  Filled 2020-04-27 (×7): qty 1

## 2020-04-27 MED ORDER — ACETAMINOPHEN 650 MG RE SUPP: 650.0000 mg | RECTAL | Status: DC | PRN

## 2020-04-27 MED ORDER — ONDANSETRON HCL 4 MG/2ML IJ SOLN
4.0000 mg | Freq: Once | INTRAMUSCULAR | Status: AC
  Administered 2020-04-27: 4 mg via INTRAVENOUS
  Filled 2020-04-27: qty 2

## 2020-04-27 NOTE — H&P (Signed)
History and Physical    [name] EXB:[id number] DOB: [birth year] DOA: 04/26/2020  PCP: Patient, No Pcp Per Consultants:  None Patient coming from:  Home - lives with son; NOK: [name], [phone]   Chief Complaint: Elevated BP and slurred speech  HPI: [name] is a 59 y.o. female with medical history significant of HTN presenting with elevated BP and slurred speech.  She reports that on 8/16 (2 days PTA) she started feeling bad (nonspecific).  That night, she started noticing some slurring of her speech.  She has HTN but does not take medication for this issue; she checked her BP and it was elevated.  She has chronic blurred vision and this was unchanged.  She reports chronic shoulder and back pain and current L arm pain.    In the ER she was thought to have hypertensive emergency with SBP up to 267; she was given PO Norvasc and Lisinopril along with IV hydralazine x 1 with significant significant decrease to SBP 160.  She was subsequently noticed to have left facial droop and left sided weakness - which may have been present upon presentation but was not reported.   ED Course:  Carryover, per Dr. Hal Hope:  Urgently called [place] for hypertensive encephalopathy MRI turned out to be acute stroke. At the time of my exam patient became more lethargic and neurology at this time is calling a code stroke and they will call back to see if we need to admit or they will be taking the patient.  Review of Systems: As per HPI; otherwise review of systems reviewed and negative.   Ambulatory Status:  Ambulates without assistance  COVID Vaccine Status:  Complete  Past Medical History:  Diagnosis Date  . Arthritis   . Hypertension    not taking medications    Past Surgical History:  Procedure Laterality Date  . CESAREAN SECTION      Social History   Socioeconomic History  . Marital status: Single    Spouse name: Not on file  . Number of children: Not on file  . Years  of education: Not on file  . Highest education level: Not on file  Occupational History  . Occupation: works on loading dock  Tobacco Use  . Smoking status: Never Smoker  . Smokeless tobacco: Never Used  Substance and Sexual Activity  . Alcohol use: Never  . Drug use: Never  . Sexual activity: Not on file  Other Topics Concern  . Not on file  Social History Narrative  . Not on file   Social Determinants of Health   Financial Resource Strain:   . Difficulty of Paying Living Expenses:   Food Insecurity:   . Worried About Charity fundraiser in the Last Year:   . Arboriculturist in the Last Year:   Transportation Needs:   . Film/video editor (Medical):   Marland Kitchen Lack of Transportation (Non-Medical):   Physical Activity:   . Days of Exercise per Week:   . Minutes of Exercise per Session:   Stress:   . Feeling of Stress :   Social Connections:   . Frequency of Communication with Friends and Family:   . Frequency of Social Gatherings with Friends and Family:   . Attends Religious Services:   . Active Member of Clubs or Organizations:   . Attends Archivist Meetings:   Marland Kitchen Marital Status:   Intimate Partner Violence:   . Fear of Current or Ex-Partner:   .  Emotionally Abused:   Marland Kitchen Physically Abused:   . Sexually Abused:     No Known Allergies  Family History  Problem Relation Age of Onset  . Cancer Mother   . Hypertension Father   . Heart disease Father   . Heart disease Brother   . Stroke Neg Hx     Prior to Admission medications   Medication Sig Start Date End Date Taking? Authorizing Provider  ibuprofen (ADVIL) 200 MG tablet Take 400 mg by mouth every 6 (six) hours as needed for headache or mild pain.   Yes [provider]  meloxicam (MOBIC) 15 MG tablet Take by mouth. Patient not taking: Reported on 04/27/2020 04/14/19   [provider]    Physical Exam: Vitals:   04/27/20 0531 04/27/20 0600 04/27/20 0700 04/27/20 0715  BP: (!) 143/69  (!) 166/75 (!) 147/68 138/80  Pulse: 86 90 93 99  Resp: 14 14 20  (!) 22  Temp:      TempSrc:      SpO2: 100% 100%    Weight:      Height:         . General:  Appears calm and comfortable and is NAD with left-sided neglect; wig in place . Eyes:  PERRL, EOMI, normal lids, iris . ENT:  grossly normal hearing, lips & tongue, mmm . Neck:  no LAD, masses or thyromegaly . Cardiovascular:  RRR, no m/r/g. No LE edema.  Marland Kitchen Respiratory:   CTA bilaterally with no wheezes/rales/rhonchi.  Normal respiratory effort. . Abdomen:  soft, NT, ND, NABS . Skin:  no rash or induration seen on limited exam . Musculoskeletal:  Left sided weakness - UE 3/5, LE 1-2/5 . Psychiatric:  blunted mood and affect, speech mildly dysarthric but appropriate, AOx3 . Neurologic: left facial droop, mild left-sided neglect    Radiological Exams on Admission: CT ANGIO HEAD W OR WO CONTRAST  Result Date: 04/27/2020 CLINICAL DATA:  Code stroke for new left-sided weakness EXAM: CT ANGIOGRAPHY HEAD AND NECK TECHNIQUE: Multidetector CT imaging of the head and neck was performed using the standard protocol during bolus administration of intravenous contrast. Multiplanar CT image reconstructions and MIPs were obtained to evaluate the vascular anatomy. Carotid stenosis measurements (when applicable) are obtained utilizing NASCET criteria, using the distal internal carotid diameter as the denominator. CONTRAST:  43mL OMNIPAQUE IOHEXOL 350 MG/ML SOLN COMPARISON:  Brain MRI from yesterday FINDINGS: CT HEAD FINDINGS Brain: By MRI there is acute infarct elongated along the body of the right corpus callosum. No hemorrhage is superimposed. Severe chronic small vessel ischemia in the cerebral white matter with chronic (by MRI) infarcts in the deep gray nuclei, especially large in the right thalamus. Remote insult to the brachium pontis on the right by MRI. Vascular: See below Skull: Negative Sinuses: Retention cyst in the left maxillary sinus.  Orbits: Negative Review of the MIP images confirms the above findings CTA NECK FINDINGS Aortic arch: Mild atheromatous plaque. Four vessel branching. No acute finding. Right carotid system: Minimal mixed density plaque at the ICA bulb. No ulceration or flow limiting stenosis. Left carotid system: Probable mild atheromatous wall thickening of the common carotid and proximal ICA. No ulceration or beading. Vertebral arteries: The left vertebral artery arises from the arch and is widely patent to the dura. No right-sided vertebral stenosis or beading is seen either. The subclavians are symmetrically and widely patent. Skeleton: Poor dentition.  No acute or aggressive finding Other neck: Negative Upper chest: Clear Review of the MIP images  confirms the above findings CTA HEAD FINDINGS Anterior circulation: Atheromatous plaque along both carotid siphons. On the left there is 55% narrowing at the paraclinoid segment based on sagittal reformats. On axial slices right cavernous ICA narrowing measures 50%. Left ICA is larger than the right due to hypoplasia of the right A1 segment. Severe right A2 segment narrowing with a central flow gap, correlating with the acute infarct. No proximal occlusion. Atheromatous type irregularity of bilateral MCA branches which is generalized. Posterior circulation: Symmetric vertebral arteries. The vertebral and basilar arteries are smooth and widely patent. Bilateral P2 moderate or advanced narrowing. Additional notable narrowing at the right PCA bifurcation. Negative for aneurysm. Venous sinuses: Diffusely patent Anatomic variants: As above Review of the MIP images confirms the above findings IMPRESSION: 1. Severe right A2 segment narrowing correlating with the acute infarct by the preceding MRI. 2. 55% left paraclinoid ICA stenosis. 50% right cavernous ICA stenosis 3. Diffuse atheromatous change to medium size vessels. Notable bilateral P2 segment stenoses. 4. Mild atherosclerosis in the  neck without stenosis or visible embolic source. Electronically Signed   By: Monte Fantasia M.D.   On: 04/27/2020 06:54   CT Head Wo Contrast  Result Date: 04/27/2020 CLINICAL DATA:  Blurred vision and elevated blood pressure. EXAM: CT HEAD WITHOUT CONTRAST TECHNIQUE: Contiguous axial images were obtained from the base of the skull through the vertex without intravenous contrast. COMPARISON:  None. FINDINGS: Brain: Hypoattenuating focus in the right thalamus age likely reflect sequela of a lacunar-type infarct, likely remote though in the absence of comparison is overall age indeterminate. No convincing sites of acute infarction, hemorrhage, hydrocephalus, extra-axial collection or mass lesion/mass effect. Symmetric prominence of the ventricles, cisterns and sulci compatible with parenchymal volume loss. Patchy areas of white matter hypoattenuation are most compatible with chronic microvascular angiopathy. Senescent mineralization of the basal ganglia. Vascular: Atherosclerotic calcification of the carotid siphons. No hyperdense vessel. Skull: No calvarial fracture or suspicious osseous lesion. No scalp swelling or hematoma. Sinuses/Orbits: Likely retention cyst in left maxillary sinus. Remaining paranasal sinuses are predominantly clear. Debris in bilateral external auditory canals. Included orbital structures are unremarkable. 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 IMPRESSION: 1. Hypoattenuating focus in the right thalamus age likely reflect sequela of a lacunar-type infarct, likely remote though in the absence of comparison is overall age indeterminate. If there is concern for acute ischemia, MRI could be obtained for further evaluation. 2. Chronic microvascular angiopathy and parenchymal volume loss. 3. Debris in bilateral external auditory canals, correlate for cerumen impaction. 4. 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 Electronically Signed   By: Lovena Le M.D.   On:  04/27/2020 02:21   CT ANGIO NECK W OR WO CONTRAST  Result Date: 04/27/2020 CLINICAL DATA:  Code stroke for new left-sided weakness EXAM: CT ANGIOGRAPHY HEAD AND NECK TECHNIQUE: Multidetector CT imaging of the head and neck was performed using the standard protocol during bolus administration of intravenous contrast. Multiplanar CT image reconstructions and MIPs were obtained to evaluate the vascular anatomy. Carotid stenosis measurements (when applicable) are obtained utilizing NASCET criteria, using the distal internal carotid diameter as the denominator. CONTRAST:  51mL OMNIPAQUE IOHEXOL 350 MG/ML SOLN COMPARISON:  Brain MRI from yesterday FINDINGS: CT HEAD FINDINGS Brain: By MRI there is acute infarct elongated along the body of the right corpus callosum. No hemorrhage is superimposed. Severe chronic small vessel ischemia in the cerebral white matter with chronic (by MRI) infarcts in the deep gray nuclei, especially large in the  right thalamus. Remote insult to the brachium pontis on the right by MRI. Vascular: See below Skull: Negative Sinuses: Retention cyst in the left maxillary sinus. Orbits: Negative Review of the MIP images confirms the above findings CTA NECK FINDINGS Aortic arch: Mild atheromatous plaque. Four vessel branching. No acute finding. Right carotid system: Minimal mixed density plaque at the ICA bulb. No ulceration or flow limiting stenosis. Left carotid system: Probable mild atheromatous wall thickening of the common carotid and proximal ICA. No ulceration or beading. Vertebral arteries: The left vertebral artery arises from the arch and is widely patent to the dura. No right-sided vertebral stenosis or beading is seen either. The subclavians are symmetrically and widely patent. Skeleton: Poor dentition.  No acute or aggressive finding Other neck: Negative Upper chest: Clear Review of the MIP images confirms the above findings CTA HEAD FINDINGS Anterior circulation: Atheromatous plaque  along both carotid siphons. On the left there is 55% narrowing at the paraclinoid segment based on sagittal reformats. On axial slices right cavernous ICA narrowing measures 50%. Left ICA is larger than the right due to hypoplasia of the right A1 segment. Severe right A2 segment narrowing with a central flow gap, correlating with the acute infarct. No proximal occlusion. Atheromatous type irregularity of bilateral MCA branches which is generalized. Posterior circulation: Symmetric vertebral arteries. The vertebral and basilar arteries are smooth and widely patent. Bilateral P2 moderate or advanced narrowing. Additional notable narrowing at the right PCA bifurcation. Negative for aneurysm. Venous sinuses: Diffusely patent Anatomic variants: As above Review of the MIP images confirms the above findings IMPRESSION: 1. Severe right A2 segment narrowing correlating with the acute infarct by the preceding MRI. 2. 55% left paraclinoid ICA stenosis. 50% right cavernous ICA stenosis 3. Diffuse atheromatous change to medium size vessels. Notable bilateral P2 segment stenoses. 4. Mild atherosclerosis in the neck without stenosis or visible embolic source. Electronically Signed   By: Monte Fantasia M.D.   On: 04/27/2020 06:54   MR BRAIN WO CONTRAST  Result Date: 04/27/2020 CLINICAL DATA:  High blood pressure and slurred speech. EXAM: MRI HEAD WITHOUT CONTRAST TECHNIQUE: Multiplanar, multiecho pulse sequences of the brain and surrounding structures were obtained without intravenous contrast. COMPARISON:  Head CT from earlier today FINDINGS: Brain: Band of restricted diffusion along the right corpus callosum body with minimal extension into the adjacent white matter and high right frontal cortex, ACA branch distribution. There is a background of confluent FLAIR hyperintensity in the cerebral white matter without mass effect. Chronic lacunes and chronic microhemorrhages seen at the deep gray nuclei. Gradient was not performed  but may show even more hypertensive type hemorrhages. Indistinct T2 hyperintensity in the pons and asymmetric T2 hyperintensity in the right middle cerebellar peduncle also attributed to chronic ischemia. No suspected primary demyelinating process. No acute hemorrhage, hydrocephalus, or masslike finding. Vascular: Preserved flow voids Skull and upper cervical spine: Normal marrow signal Sinuses/Orbits: Retention cyst in the left maxillary sinus. Other: Truncated study due to patient discomfort IMPRESSION: 1. Acute right ACA branch infarct. 2. Severe chronic small vessel ischemia. 3. Truncated study due to patient discomfort. Electronically Signed   By: Monte Fantasia M.D.   On: 04/27/2020 05:44    EKG: Independently reviewed.  NSR with rate 91; prolonged QTc 477; nonspecific ST changes with no evidence of acute ischemia   Labs on Admission: I have personally reviewed the available labs and imaging studies at the time of the admission.  Pertinent labs:   K+ 2.9 Glucose 119  BUN 26/Creatinine 1.81/GFR 30 - normal in 2017 WBC 9.9 Platelets 143 INR 1.1 HCG <5 ETOH <10 COVID negative      Assessment/Plan Active Problems:   Acute CVA (cerebrovascular accident) ([place])   Accelerated hypertension   Renal dysfunction   Hypokalemia   CVA -Patient presenting with what was thought to be hypertensive crisis, but MRI confirms acute R ACA branch infarct with severe chronic small vessel ischemia -CTA with severe right A1 segment narrowing correlating with acute infarct; also with 50-55% carotid stenosis -She has significant left-sided deficits in addition to speech impairment and likely will need CIR post-hospitalization -Will admit for further CVA evaluation -Telemetry monitoring -Echo -Carotid dopplers -Risk stratification with FLP, A1c; will also check TSH and UDS -ASA daily -Neurology consult -PT/OT/ST/Nutrition Consults -Will start empiric Lipitor 40 mg daily  HTN -Allow permissive HTN  for now -Treat BP only if >220/120, and then with goal of 15% reduction -She was admitted with hypertensive crisis in 2017 but does not appear to have followed up and does not appear to have been taking medications for this issue   Renal dysfunction -Normal renal function in 2017 with no evident recheck in the interim -Currently with clearly abnormal renal function -This may be related to AKI but is more likely chronic related to untrated HTN -Will give gentle IVF hydration (100 cc/hr) and recheck BMP in AM  Hypokalemia -Repleted with 20 mEq x 1 in the ER and will add to IVF     Body mass index is 27.46 kg/m.  Note: This patient has been tested and is negative for the novel coronavirus COVID-19.    DVT prophylaxis:  Lovenox  Code Status: DNR - confirmed with patient Family Communication: None present; I was unable to reach her son by telephone or leave a message at the time of admission Disposition Plan:  The patient is from: home  Anticipated d/c is to: CIR  Anticipated d/c date will depend on clinical response to treatment, likely several days  Patient is currently: acutely ill Consults called: Neurology; PT/OT/ST/Nutrition  Admission status: Admit - It is my clinical opinion that admission to INPATIENT is reasonable and necessary because of the expectation that this patient will require hospital care that crosses at least 2 midnights to treat this condition based on the medical complexity of the problems presented.  Given the aforementioned information, the predictability of an adverse outcome is felt to be significant.    Karmen Bongo MD [facility]   How to contact the [facility] Attending or Consulting provider [place] or covering provider during after hours [place], for this patient?  1. Check the care team in [facility] and look for a) attending/consulting [facility] provider listed and b) the [facility] team listed 2. Log into [email or web address] and use [place]'s universal password to  access. If you do not have the password, please contact the hospital operator. 3. Locate the [facility] provider you are looking for under [facility] and page to a number that you can be directly reached. 4. If you still have difficulty reaching the provider, please page the [facility] (Director on Call) for the Hospitalists listed on amion for assistance.   04/27/2020, 9:01 AM

## 2020-04-27 NOTE — ED Provider Notes (Addendum)
[facility] EMERGENCY DEPARTMENT Provider Note   CSN: [id number] Arrival date & time: 04/26/20  2059     History Chief Complaint  Patient presents with  . Hypertension    [name] is a 59 y.o. female.  HPI     This a 59 year old female with a history of hypertension and anemia who presents with concerns for high blood pressure and slurred speech.  Patient reports that she noted slurred speech on Monday.  She took her blood pressure at home and it was significantly elevated.  She also reports blurry vision but at baseline states she has some blurry vision.  She denies any weakness, numbness, tingling, other strokelike symptoms.  She states she did have a headache "on the top of my head" at one time but is not having a headache now.  No chest pain or shortness of breath.  No recent fevers or illnesses.  She is not currently on any blood pressure medication.  She is fully vaccinated against COVID-19.  She has a chart that needs to be merged.  I have reviewed that chart and it appears in 2017 she was admitted for hypertensive urgency/emergency.  She was discharged on lisinopril and amlodipine.  She was admitted to the resident service at that time but does not appear to have followed up past 2017.  History reviewed. No pertinent past medical history.  There are no problems to display for this patient.   History reviewed. No pertinent surgical history.   OB History   No obstetric history on file.     No family history on file.  Social History   Tobacco Use  . Smoking status: Not on file  . Smokeless tobacco: Never Used  Substance Use Topics  . Alcohol use: Not Currently  . Drug use: Not Currently    Home Medications Prior to Admission medications   Medication Sig Start Date End Date Taking? Authorizing Provider  ibuprofen (ADVIL) 200 MG tablet Take 400 mg by mouth every 6 (six) hours as needed for headache or mild pain.   Yes [provider]  meloxicam (MOBIC) 15 MG tablet Take by mouth. Patient not taking: Reported on 04/27/2020 04/14/19   [provider]    Allergies    Patient has no known allergies.  Review of Systems   Review of Systems  Constitutional: Negative for fever.  Respiratory: Negative for shortness of breath.   Cardiovascular: Negative for chest pain.  Gastrointestinal: Negative for abdominal pain, nausea and vomiting.  Genitourinary: Negative for dysuria.  Neurological: Positive for speech difficulty and headaches. Negative for weakness.  All other systems reviewed and are negative.   Physical Exam Updated Vital Signs BP (!) 166/89   Pulse 89   Temp 98.1 F (36.7 C) (Oral)   Resp 16   Ht 1.626 m (5\' 4" )   Wt 72.6 kg   SpO2 100%   BMI 27.46 kg/m   Physical Exam Vitals and nursing note reviewed.  Constitutional:      Appearance: Normal appearance. She is well-developed. She is not ill-appearing.  HENT:     Head: Normocephalic and atraumatic.     Nose: Nose normal.     Mouth/Throat:     Mouth: Mucous membranes are moist.  Eyes:     Pupils: Pupils are equal, round, and reactive to light.  Cardiovascular:     Rate and Rhythm: Normal rate and regular rhythm.     Heart sounds: Normal heart sounds.  Pulmonary:  Effort: Pulmonary effort is normal. No respiratory distress.     Breath sounds: No wheezing.  Abdominal:     General: Bowel sounds are normal.     Palpations: Abdomen is soft.     Tenderness: There is no abdominal tenderness.  Musculoskeletal:     Cervical back: Neck supple.     Right lower leg: No edema.     Left lower leg: No edema.  Skin:    General: Skin is warm and dry.  Neurological:     Mental Status: She is alert and oriented to person, place, and time.     Comments: Cranial nerves II through XII intact, speech is slowed but patient can name, repeat and is fluent, 5 out of 5 strength in all 4 extremities, no dysmetria to finger-nose-finger  Psychiatric:          Mood and Affect: Mood normal.     ED Results / Procedures / Treatments   Labs (all labs ordered are listed, but only abnormal results are displayed) Labs Reviewed  CBC - Abnormal; Notable for the following components:      Result Value   Platelets 143 (*)    All other components within normal limits  BASIC METABOLIC PANEL - Abnormal; Notable for the following components:   Potassium 2.9 (*)    Glucose, Bld 119 (*)    BUN 26 (*)    Creatinine, Ser 1.81 (*)    GFR calc non Af Amer 30 (*)    GFR calc Af Amer 35 (*)    All other components within normal limits  I-STAT CHEM 8, ED - Abnormal; Notable for the following components:   Potassium 3.1 (*)    BUN 28 (*)    Creatinine, Ser 1.80 (*)    Glucose, Bld 113 (*)    All other components within normal limits  ETHANOL  PROTIME-INR  APTT  DIFFERENTIAL  RAPID URINE DRUG SCREEN, HOSP PERFORMED  URINALYSIS, ROUTINE W REFLEX MICROSCOPIC  I-STAT BETA HCG BLOOD, ED ([facility], [facility], [facility] ONLY)    EKG ED ECG REPORT   Date: 04/27/2020  Rate: 91  Rhythm: normal sinus rhythm  QRS Axis: normal  Intervals: QT prolonged  ST/T Wave abnormalities: nonspecific T wave changes  Conduction Disutrbances:none  Narrative Interpretation: LVH  Old EKG Reviewed: none available  I have personally reviewed the EKG tracing and agree with the computerized printout as noted.   Radiology CT Head Wo Contrast  Result Date: 04/27/2020 CLINICAL DATA:  Blurred vision and elevated blood pressure. EXAM: CT HEAD WITHOUT CONTRAST TECHNIQUE: Contiguous axial images were obtained from the base of the skull through the vertex without intravenous contrast. COMPARISON:  None. FINDINGS: Brain: Hypoattenuating focus in the right thalamus age likely reflect sequela of a lacunar-type infarct, likely remote though in the absence of comparison is overall age indeterminate. No convincing sites of acute infarction, hemorrhage, hydrocephalus, extra-axial collection or mass  lesion/mass effect. Symmetric prominence of the ventricles, cisterns and sulci compatible with parenchymal volume loss. Patchy areas of white matter hypoattenuation are most compatible with chronic microvascular angiopathy. Senescent mineralization of the basal ganglia. Vascular: Atherosclerotic calcification of the carotid siphons. No hyperdense vessel. Skull: No calvarial fracture or suspicious osseous lesion. No scalp swelling or hematoma. Sinuses/Orbits: Likely retention cyst in left maxillary sinus. Remaining paranasal sinuses are predominantly clear. Debris in bilateral external auditory canals. Included orbital structures are unremarkable. 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 IMPRESSION: 1. Hypoattenuating focus in the right thalamus age  likely reflect sequela of a lacunar-type infarct, likely remote though in the absence of comparison is overall age indeterminate. If there is concern for acute ischemia, MRI could be obtained for further evaluation. 2. Chronic microvascular angiopathy and parenchymal volume loss. 3. Debris in bilateral external auditory canals, correlate for cerumen impaction. 4. 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 Electronically Signed   By: Lovena Le M.D.   On: 04/27/2020 02:21    Procedures Procedures (including critical care time)  CRITICAL CARE Performed by: Merryl Hacker   Total critical care time: 40 minutes  Critical care time was exclusive of separately billable procedures and treating other patients.  Critical care was necessary to treat or prevent imminent or life-threatening deterioration.  Critical care was time spent personally by me on the following activities: development of treatment plan with patient and/or surrogate as well as nursing, discussions with consultants, evaluation of patient's response to treatment, examination of patient, obtaining history from patient or surrogate, ordering and performing treatments and  interventions, ordering and review of laboratory studies, ordering and review of radiographic studies, pulse oximetry and re-evaluation of patient's condition.   Medications Ordered in ED Medications  potassium chloride 10 mEq in 100 mL IVPB (0 mEq Intravenous Stopped 04/27/20 0345)  LORazepam (ATIVAN) injection 1 mg (has no administration in time range)  ondansetron (ZOFRAN) injection 4 mg (has no administration in time range)  hydrALAZINE (APRESOLINE) injection 10 mg (10 mg Intravenous Given 04/27/20 0246)  amLODipine (NORVASC) tablet 10 mg (10 mg Oral Given 04/27/20 0249)  lisinopril (ZESTRIL) tablet 20 mg (20 mg Oral Given 04/27/20 0249)    ED Course  I have reviewed the triage vital signs and the nursing notes.  Pertinent labs & imaging results that were available during my care of the patient were reviewed by me and considered in my medical decision making (see chart for details).    MDM Rules/Calculators/A&P                          Patient presents with slurred speech greater than 24 hours.  She noted to have significantly elevated blood pressures at home.  She is 250s over 120s on initial arrival.  Patient denied history of high blood pressure and she does not take any medication.  However, review of her merged chart shows that she was admitted in 2017 for hypertensive urgency/emergency.  She has some slowed speech on exam but can name and repeat.  Otherwise she has a nonfocal neurologic exam.  She is out of the window for code stroke and TPA.  I am concerned regarding how high her blood pressure is and her symptoms.  I have ordered a CT to rule out bleed.  Have also ordered additional stroke labs.  She was given a dose of 10 mg of IV hydralazine and p.o. amlodipine and lisinopril.  This is what she was discharged with in 2017.  CT scan shows some changes concerning for prolonged hypertension.  Additionally, she has likely an age-indeterminate lacunar infarct.  Will obtain MRI.  Blood  pressures responded nicely to medications.  She is now 160s over 100s.  She continues to have some slurred speech.  Feel this represents hypertensive urgency/emergency.  She could also have PRES vs stroke.  MRI will better tease this out.  Feel she needs admission for blood pressure control regardless.  Will call for admission.  6:49 AM Patient admitted to hospitalist team.  On hospitalist evaluation,  noted to have subtle left facial droop and left lower extremity weakness.  This was not noted on my initial exam and her only symptom was slurred speech.  I evaluated her around 1:50 AM.  He consulted the neuro hospitalist who I spoke with.  He is concerned she may have had a more acute event especially given that she has an acute infarct on her MRI.  Of note, her blood pressure did drop into the 140s range.  Question whether she may have had a watershed event as well.  CTA head and neck ordered and neurology involved.  Final Clinical Impression(s) / ED Diagnoses Final diagnoses:  Hypertensive emergency  Slurred speech    Rx / DC Orders ED Discharge Orders    None       Hilton Saephan, Barbette Hair, MD 04/27/20 [id number]    Merryl Hacker, MD 04/27/20 1937    Merryl Hacker, MD 04/27/20 [phone]

## 2020-04-27 NOTE — ED Notes (Signed)
Patient is nauseated at this time.  MD notified.  New orders per Dr Dina Rich.

## 2020-04-27 NOTE — ED Notes (Signed)
Activated Code Stroke w/[facility] per Dr Otto Herb

## 2020-04-27 NOTE — Consult Note (Signed)
NEUROLOGY CONSULTATION NOTE   Date of service: April 27, 2020 Patient Name: [name] MRN:  [id number] DOB:  [birth year] Reason for consult: "Slurred speech"  History of Present Illness  [name] is a 59 y.o. female with PMH significant for HTN, Osteoarthritis who presents with elevated blood pressure and slurred speech.  Patient somnolent on my initial evaluation and unable to provide any meaningful history.  Patient was initially seen by ED provider and per notes, patient was noted to have slurred speech since Monday(04/24/20).  She took her blood pressure medication at home and it was significantly elevated.  She also reported blurry vision at baseline.  Work-up with CT head demonstrated a right thalamic hypoattenuation concerning for lacunar infarct.  MRI brain without contrast demonstrated a new right ACA infarct.  Neurology was asked to see this patient in my initial evaluation patient appears to have a left facial droop and noted to be moving her left side of the body much less than the right side of the body.  On chart review, patient did get Ativan 1 mg for MRI about 2-1/2 hours before my evaluation.  On discussion with the ED team, there was no concern for any focal left-sided weakness or left facial droop on her initial presentation and on evaluation by ED provider at 0150 on 04/27/2020.  Of note, patient initially presented in hypertensive emergency with SBP elevated to 267 and consistently above 200s.  She got p.o. amlodipine and lisinopril along with IV hydralazine with a drop in blood pressure to 426S systolic. I ordered a fluid bolus of 1L.  NIHSS: 7 LKW: 04/24/20 MRS: 0 TPA: outside the tPA window Thrombectomy: not a candidate.   ROS   Unable to obtain due to somnolence.  Past History  History reviewed. No pertinent past medical history. History reviewed. No pertinent surgical history. No family history on file. Social History   Socioeconomic History    Marital status: Single    Spouse name: Not on file   Number of children: Not on file   Years of education: Not on file   Highest education level: Not on file  Occupational History   Not on file  Tobacco Use   Smoking status: Not on file   Smokeless tobacco: Never Used  Substance and Sexual Activity   Alcohol use: Not Currently   Drug use: Not Currently   Sexual activity: Not on file  Other Topics Concern   Not on file  Social History Narrative   Not on file   Social Determinants of Health   Financial Resource Strain:    Difficulty of Paying Living Expenses:   Food Insecurity:    Worried About [place] in the Last Year:    Arboriculturist in the Last Year:   Transportation Needs:    Film/video editor (Medical):    Lack of Transportation (Non-Medical):   Physical Activity:    Days of Exercise per Week:    Minutes of Exercise per Session:   Stress:    Feeling of Stress :   Social Connections:    Frequency of Communication with Friends and Family:    Frequency of Social Gatherings with Friends and Family:    Attends Religious Services:    Active Member of Clubs or Organizations:    Attends Music therapist:    Marital Status:    No Known Allergies  Medications  (Not in a hospital admission)    Vitals  Temp:  [  98.1 F (36.7 C)-99 F (37.2 C)] 98.1 F (36.7 C) (08/18 0142) Pulse Rate:  [78-99] 86 (08/18 0531) Resp:  [12-23] 14 (08/18 0531) BP: (143-267)/(69-155) 143/69 (08/18 0531) SpO2:  [98 %-100 %] 100 % (08/18 0531) Weight:  [72.6 kg] 72.6 kg (08/17 2127)  Body mass index is 27.46 kg/m.  Physical Exam   General: Laying comfortably in bed; in no acute distress.  HENT: Normal oropharynx and mucosa. Normal external appearance of ears and nose. Neck: Supple, no pain or tenderness CV: No JVD. No peripheral edema. Pulmonary: Symmetric Chest rise. Normal respiratory effort. Abdomen: Soft to touch,  non-tender Ext: No cyanosis, edema, or deformity  Skin: No rash. Normal palpation of skin.   Musculoskeletal: Normal digits and nails by inspection. No clubbing.  Neurologic Examination  Mental status/Cognition: somnolent, oriented to self, place, month and year, poor attention. Opens eyes briefly, goes back to sleep in 5-10 secs. Speech/language: Non fluent, comprehension intact to simple commands, object naming intact. Mild dysarthria. Cranial nerves:   CN II Pupils equal and reactive to light, blinks to threat,   CN III,IV,VI Looks to left and right, no gaze preference or deviation, no nystagmus   CN V Normal sensation in face BL.   CN VII Flattening of left nasolabial fold.   CN VIII normal hearing to speech   CN IX & X normal palatal elevation, no uvular deviation   CN XI    CN XII    Motor:  Muscle bulk: normal, tone normal, pronator drift none.  Somnolence precludes detailed motor exam. With a lot of encouragement, was able to get exam as below. RUE with no drift with 5/5 on hand grip. LUE with about a 4/5 on hand grip and falls to bed in 1-2 secs. RLE with no drift, can hold in the air for more than 5 secs. LLE drifts down to the bed in 2-3 secs.  Mvmt Root Nerve  Muscle Right Left Comments  SA C5/6 Ax Deltoid     EF C5/6 Mc Biceps     EE C6/7/8 Rad Triceps     WF C6/7 Med FCR     WE C7/8 PIN ECU     F Ab C8/T1 U ADM/FDI     HF L1/2/3 Fem Illopsoas     KE L2/3/4 Fem Quad     DF L4/5 D Peron Tib Ant     PF S1/2 Tibial Grc/Sol      Reflexes:  Right Left Comments  Pectoralis      Biceps (C5/6) 1 1   Brachioradialis (C5/6) 1 1    Triceps (C6/7) 1 1    Patellar (L3/4) 1 1    Achilles (S1) 0 0    Hoffman      Plantar     Jaw jerk    Sensation: Responds to nailbed pressure in all extremities.  Coordination/Complex Motor:  Somnolence precludes assessment of coordination or gait.  Labs   Lab Results  Component Value Date   NA 142 04/27/2020   K 3.1 (L)  04/27/2020   CL 103 04/27/2020   CO2 24 04/26/2020   GLUCOSE 113 (H) 04/27/2020   BUN 28 (H) 04/27/2020   CREATININE 1.80 (H) 04/27/2020   CALCIUM 9.6 04/26/2020   GFRNONAA 30 (L) 04/26/2020   GFRAA 35 (L) 04/26/2020     Imaging and Diagnostic studies  MRI Brain without contrast: 1. Acute right ACA branch infarct. 2. Severe chronic small vessel ischemia. 3. Truncated study due to patient  discomfort.  Initial CTH w/o Contrast: IMPRESSION: 1. Hypoattenuating focus in the right thalamus age likely reflect sequela of a lacunar-type infarct, likely remote though in the absence of comparison is overall age indeterminate. If there is concern for acute ischemia, MRI could be obtained for further evaluation. 2. Chronic microvascular angiopathy and parenchymal volume loss. 3. Debris in bilateral external auditory canals, correlate for cerumen impaction. 4. 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  CT angio Brain and Neck: IMPRESSION: 1. Severe right A2 segment narrowing correlating with the acute infarct by the preceding MRI. 2. 55% left paraclinoid ICA stenosis. 50% right cavernous ICA stenosis 3. Diffuse atheromatous change to medium size vessels. Notable bilateral P2 segment stenoses. 4. Mild atherosclerosis in the neck without stenosis or visible embolic source.      Impression   Drina Jobst is a 59 y.o. female with PMH significant for HTN, Osteoarthritis who presents with elevated blood pressure and slurred speech. MRI Brain with a R ACA infarct. She was noted later to be moving her L side less than her R side with a L facial droop. She initially developed slurred speech on 04/24/20 at unknown time. Developed L sided weakness in the ED with a new LKW of [place] on 04/27/20. She was out of the tPA window. CTA with severe R A2 narrowing which corresponds to the earlier noted R ACA infarct on the MRI Brain but no other large vessel occlusion, therefore not a candidate for  thrombectomy.  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  Recommendations   - Neuro checks Q2 hours. - Brain imaging- MRI Brain R ACA ischemic stroke. - Vascular imaging- CTA Brain/Neck R A2 severe stenosis - Recommend TTE - Recommend Lipid panel - Recommend Atorvastatin 80mg  daily - Recommend obtaining HbA1C - I ordered Aspirin 325mg  daily. - Recommend DVT ppx - SBP goal - permissive hypertension first 24 h < 220/110. Hold home meds. Use PRN Labetalol or Hydralazine for SBP greater than 220. - I ordered Fluids Bolus 1L and started maintenance at 159ml/hr. - Recommend Telemetry monitoring for arrythmia. - Recommend Swallow screen prior to any PO intake. - Stroke education - Recommend PT/OT/SLP consult - Tox screen is pending.  ______________________________________________________________________   Thank you for the opportunity to take part in the care of this patient. If you have any further questions, please contact the neurology consultation attending.  Signed,  [place] Pager Number [phone]

## 2020-04-27 NOTE — ED Notes (Signed)
Patient came back without MRI being done.  Patient having pain at this time.

## 2020-04-27 NOTE — Progress Notes (Signed)
  Echocardiogram 2D Echocardiogram has been performed.  [name] 04/27/2020, 12:36 PM

## 2020-04-27 NOTE — ED Notes (Signed)
Neuro at bedside performing Bubble study

## 2020-04-27 NOTE — Progress Notes (Signed)
STROKE TEAM PROGRESS NOTE   INTERVAL HISTORY Patient husband is present at the bedside.  I have obtained history of presenting illness with the patient, husband, reviewed electronic medical records and imaging films in PACS.  She presented with slurred speech and left-sided weakness.  MRI scan shows right anterior cerebral artery infarct as well as old subcortical lacune.  CT angiogram shows right A2-A3 stenosis versus occlusion.  Echocardiogram is pending.  Transplant Doppler bubble study was done at the bedside and is negative for right-to-left shunt.  Vitals:   04/27/20 0600 04/27/20 0700 04/27/20 0715 04/27/20 0900  BP: (!) 166/75 (!) 147/68 138/80 (!) 155/83  Pulse: 90 93 99 88  Resp: 14 20 (!) 22 13  Temp:      TempSrc:      SpO2: 100%     Weight:      Height:       CBC:  Recent Labs  Lab 04/26/20 2135 04/27/20 0237 04/27/20 0246  WBC 9.9  --   --   NEUTROABS  --  7.2  --   HGB 12.5  --  13.3  HCT 39.0  --  39.0  MCV 89.0  --   --   PLT 143*  --   --    Basic Metabolic Panel:  Recent Labs  Lab 04/26/20 2135 04/27/20 0246  NA 141 142  K 2.9* 3.1*  CL 105 103  CO2 24  --   GLUCOSE 119* 113*  BUN 26* 28*  CREATININE 1.81* 1.80*  CALCIUM 9.6  --    Lipid Panel: No results for input(s): CHOL, TRIG, HDL, CHOLHDL, VLDL, LDLCALC in the last 168 hours. HgbA1c: No results for input(s): HGBA1C in the last 168 hours. Urine Drug Screen:  Recent Labs  Lab 04/27/20 0914  LABOPIA NONE DETECTED  COCAINSCRNUR NONE DETECTED  LABBENZ NONE DETECTED  AMPHETMU NONE DETECTED  THCU NONE DETECTED  LABBARB NONE DETECTED    Alcohol Level  Recent Labs  Lab 04/27/20 0237  ETH <10    IMAGING past 24 hours CT ANGIO HEAD W OR WO CONTRAST  Result Date: 04/27/2020 CLINICAL DATA:  Code stroke for new left-sided weakness EXAM: CT ANGIOGRAPHY HEAD AND NECK TECHNIQUE: Multidetector CT imaging of the head and neck was performed using the standard protocol during bolus administration  of intravenous contrast. Multiplanar CT image reconstructions and MIPs were obtained to evaluate the vascular anatomy. Carotid stenosis measurements (when applicable) are obtained utilizing NASCET criteria, using the distal internal carotid diameter as the denominator. CONTRAST:  12mL OMNIPAQUE IOHEXOL 350 MG/ML SOLN COMPARISON:  Brain MRI from yesterday FINDINGS: CT HEAD FINDINGS Brain: By MRI there is acute infarct elongated along the body of the right corpus callosum. No hemorrhage is superimposed. Severe chronic small vessel ischemia in the cerebral white matter with chronic (by MRI) infarcts in the deep gray nuclei, especially large in the right thalamus. Remote insult to the brachium pontis on the right by MRI. Vascular: See below Skull: Negative Sinuses: Retention cyst in the left maxillary sinus. Orbits: Negative Review of the MIP images confirms the above findings CTA NECK FINDINGS Aortic arch: Mild atheromatous plaque. Four vessel branching. No acute finding. Right carotid system: Minimal mixed density plaque at the ICA bulb. No ulceration or flow limiting stenosis. Left carotid system: Probable mild atheromatous wall thickening of the common carotid and proximal ICA. No ulceration or beading. Vertebral arteries: The left vertebral artery arises from the arch and is widely patent to the dura. No  right-sided vertebral stenosis or beading is seen either. The subclavians are symmetrically and widely patent. Skeleton: Poor dentition.  No acute or aggressive finding Other neck: Negative Upper chest: Clear Review of the MIP images confirms the above findings CTA HEAD FINDINGS Anterior circulation: Atheromatous plaque along both carotid siphons. On the left there is 55% narrowing at the paraclinoid segment based on sagittal reformats. On axial slices right cavernous ICA narrowing measures 50%. Left ICA is larger than the right due to hypoplasia of the right A1 segment. Severe right A2 segment narrowing with a  central flow gap, correlating with the acute infarct. No proximal occlusion. Atheromatous type irregularity of bilateral MCA branches which is generalized. Posterior circulation: Symmetric vertebral arteries. The vertebral and basilar arteries are smooth and widely patent. Bilateral P2 moderate or advanced narrowing. Additional notable narrowing at the right PCA bifurcation. Negative for aneurysm. Venous sinuses: Diffusely patent Anatomic variants: As above Review of the MIP images confirms the above findings IMPRESSION: 1. Severe right A2 segment narrowing correlating with the acute infarct by the preceding MRI. 2. 55% left paraclinoid ICA stenosis. 50% right cavernous ICA stenosis 3. Diffuse atheromatous change to medium size vessels. Notable bilateral P2 segment stenoses. 4. Mild atherosclerosis in the neck without stenosis or visible embolic source. Electronically Signed   By: Monte Fantasia M.D.   On: 04/27/2020 06:54   CT Head Wo Contrast  Result Date: 04/27/2020 CLINICAL DATA:  Blurred vision and elevated blood pressure. EXAM: CT HEAD WITHOUT CONTRAST TECHNIQUE: Contiguous axial images were obtained from the base of the skull through the vertex without intravenous contrast. COMPARISON:  None. FINDINGS: Brain: Hypoattenuating focus in the right thalamus age likely reflect sequela of a lacunar-type infarct, likely remote though in the absence of comparison is overall age indeterminate. No convincing sites of acute infarction, hemorrhage, hydrocephalus, extra-axial collection or mass lesion/mass effect. Symmetric prominence of the ventricles, cisterns and sulci compatible with parenchymal volume loss. Patchy areas of white matter hypoattenuation are most compatible with chronic microvascular angiopathy. Senescent mineralization of the basal ganglia. Vascular: Atherosclerotic calcification of the carotid siphons. No hyperdense vessel. Skull: No calvarial fracture or suspicious osseous lesion. No scalp  swelling or hematoma. Sinuses/Orbits: Likely retention cyst in left maxillary sinus. Remaining paranasal sinuses are predominantly clear. Debris in bilateral external auditory canals. Included orbital structures are unremarkable. 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 IMPRESSION: 1. Hypoattenuating focus in the right thalamus age likely reflect sequela of a lacunar-type infarct, likely remote though in the absence of comparison is overall age indeterminate. If there is concern for acute ischemia, MRI could be obtained for further evaluation. 2. Chronic microvascular angiopathy and parenchymal volume loss. 3. Debris in bilateral external auditory canals, correlate for cerumen impaction. 4. 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 Electronically Signed   By: Lovena Le M.D.   On: 04/27/2020 02:21   CT ANGIO NECK W OR WO CONTRAST  Result Date: 04/27/2020 CLINICAL DATA:  Code stroke for new left-sided weakness EXAM: CT ANGIOGRAPHY HEAD AND NECK TECHNIQUE: Multidetector CT imaging of the head and neck was performed using the standard protocol during bolus administration of intravenous contrast. Multiplanar CT image reconstructions and MIPs were obtained to evaluate the vascular anatomy. Carotid stenosis measurements (when applicable) are obtained utilizing NASCET criteria, using the distal internal carotid diameter as the denominator. CONTRAST:  44mL OMNIPAQUE IOHEXOL 350 MG/ML SOLN COMPARISON:  Brain MRI from yesterday FINDINGS: CT HEAD FINDINGS Brain: By MRI there is acute infarct elongated along  the body of the right corpus callosum. No hemorrhage is superimposed. Severe chronic small vessel ischemia in the cerebral white matter with chronic (by MRI) infarcts in the deep gray nuclei, especially large in the right thalamus. Remote insult to the brachium pontis on the right by MRI. Vascular: See below Skull: Negative Sinuses: Retention cyst in the left maxillary sinus. Orbits: Negative  Review of the MIP images confirms the above findings CTA NECK FINDINGS Aortic arch: Mild atheromatous plaque. Four vessel branching. No acute finding. Right carotid system: Minimal mixed density plaque at the ICA bulb. No ulceration or flow limiting stenosis. Left carotid system: Probable mild atheromatous wall thickening of the common carotid and proximal ICA. No ulceration or beading. Vertebral arteries: The left vertebral artery arises from the arch and is widely patent to the dura. No right-sided vertebral stenosis or beading is seen either. The subclavians are symmetrically and widely patent. Skeleton: Poor dentition.  No acute or aggressive finding Other neck: Negative Upper chest: Clear Review of the MIP images confirms the above findings CTA HEAD FINDINGS Anterior circulation: Atheromatous plaque along both carotid siphons. On the left there is 55% narrowing at the paraclinoid segment based on sagittal reformats. On axial slices right cavernous ICA narrowing measures 50%. Left ICA is larger than the right due to hypoplasia of the right A1 segment. Severe right A2 segment narrowing with a central flow gap, correlating with the acute infarct. No proximal occlusion. Atheromatous type irregularity of bilateral MCA branches which is generalized. Posterior circulation: Symmetric vertebral arteries. The vertebral and basilar arteries are smooth and widely patent. Bilateral P2 moderate or advanced narrowing. Additional notable narrowing at the right PCA bifurcation. Negative for aneurysm. Venous sinuses: Diffusely patent Anatomic variants: As above Review of the MIP images confirms the above findings IMPRESSION: 1. Severe right A2 segment narrowing correlating with the acute infarct by the preceding MRI. 2. 55% left paraclinoid ICA stenosis. 50% right cavernous ICA stenosis 3. Diffuse atheromatous change to medium size vessels. Notable bilateral P2 segment stenoses. 4. Mild atherosclerosis in the neck without  stenosis or visible embolic source. Electronically Signed   By: Monte Fantasia M.D.   On: 04/27/2020 06:54   MR BRAIN WO CONTRAST  Result Date: 04/27/2020 CLINICAL DATA:  High blood pressure and slurred speech. EXAM: MRI HEAD WITHOUT CONTRAST TECHNIQUE: Multiplanar, multiecho pulse sequences of the brain and surrounding structures were obtained without intravenous contrast. COMPARISON:  Head CT from earlier today FINDINGS: Brain: Band of restricted diffusion along the right corpus callosum body with minimal extension into the adjacent white matter and high right frontal cortex, ACA branch distribution. There is a background of confluent FLAIR hyperintensity in the cerebral white matter without mass effect. Chronic lacunes and chronic microhemorrhages seen at the deep gray nuclei. Gradient was not performed but may show even more hypertensive type hemorrhages. Indistinct T2 hyperintensity in the pons and asymmetric T2 hyperintensity in the right middle cerebellar peduncle also attributed to chronic ischemia. No suspected primary demyelinating process. No acute hemorrhage, hydrocephalus, or masslike finding. Vascular: Preserved flow voids Skull and upper cervical spine: Normal marrow signal Sinuses/Orbits: Retention cyst in the left maxillary sinus. Other: Truncated study due to patient discomfort IMPRESSION: 1. Acute right ACA branch infarct. 2. Severe chronic small vessel ischemia. 3. Truncated study due to patient discomfort. Electronically Signed   By: Monte Fantasia M.D.   On: 04/27/2020 05:44    PHYSICAL EXAM Mildly obese middle-aged African-American lady not in distress. . Afebrile. Head is nontraumatic.  Neck is supple without bruit.    Cardiac exam no murmur or gallop. Lungs are clear to auscultation. Distal pulses are well felt. Neurological Exam :  Sleepy but arouses easily.  Mild dysarthria no aphasia.  Extraocular movements full range without nystagmus.  Blinks to threat bilaterally.  Mild left  lower facial weakness.  Tongue midline.  Motor system exam shows mild left upper and lower extremity drift with weakness of left grip intrinsic hand muscles of the left hip flexors ankle dorsiflexors.  Sensation diminished on the left compared to the right.  Coordination is slow on the left compared to the right.  Gait not tested. ASSESSMENT/PLAN [name] is a 59 y.o. female with history of HTN, OA presenting with slurred speech, L hemiparesis and hypertensive emervency.   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 hypoattenuation R thalamic ? Acute infarct. Chronic Small vessel disease + Atrophy. likey B cerumen in auditory canals. R>L TMJ.   MRI  Acute R ACA infarct. Severe small vessel disease.   CTA head & neck severe R A2 narrowing. L paraclinoid ICA 55%, R cavernous ICA 50%. Diffuse atherosclerosis head aneurysm neck.  Carotid Doppler  pending   2D Echo pending   Consider TEE and loop if above workup negative    TCD bubble negative  LDL pending    HgbA1c pending  UDS pending   VTE prophylaxis - Lovenox 40 mg sq daily   No antithrombotic prior to admission, now on aspirin 300 mg suppository daily. Passed  swallow, added plavix and decrease aspirin to 81. Continue DAPT x 3 weeks then aspirin alone    Therapy recommendations:  pending   Disposition:  pending   Hypertensive Emergency  SBP as high as 267, consistently > 200  Home meds:  none  Stable . Permissive hypertension (OK if < 220/120) but gradually normalize in 5-7 days . Long-term BP goal normotensive  Hyperlipidemia  Home meds:  No statin  Now on lipitor 40  LDL pending, goal < 70  Continue statin at discharge  Other Stroke Risk Factors  Overweight, Body mass index is 27.46 kg/m., recommend weight loss, diet and exercise as appropriate   Other Active Problems  Renal dysfunction, AKI   Hypokalemia    RA, recent injection of Humira -  followed by Berna Bue not felt to be associated with current stroke, though there are some reports of possible association  Hospital day # 0  She presented with slurred speech and left hemiparesis due to right ACA infarct and MRI also shows old lacunar infarcts she has multiple uncontrolled risk factors.  Transcranial Doppler bubble study was done at the bedside was negative for right-to-left shunt.  May consider TEE and cardiac monitoring as outpatient after discharge recommend aspirin Plavix for 3 weeks followed by aspirin alone and aggressive risk factor modification.  Long discussion with patient and husband at the bedside and answered questions.  Greater than 50% time during the 35-minute visit was spent in counseling and coordination of care about her stroke and discussion about stroke prevention and treatment. Antony Contras, MD To contact Stroke Continuity provider, please refer to [email or web address] After hours, contact General Neurology

## 2020-04-27 NOTE — ED Notes (Signed)
Patient taken to MRI

## 2020-04-27 NOTE — ED Notes (Signed)
Patient complaining of neck and shoulder pain and unable to lay flat to do MRI.

## 2020-04-27 NOTE — Progress Notes (Signed)
TCB Bubble study has been completed.   Preliminary results in CV Proc.   Abram Sander 04/27/2020 2:46 PM

## 2020-04-27 NOTE — ED Notes (Signed)
Neuro and medical hospitalist in for pt eval - requesting to call a code stroke. Pt to CT with RN and on monitor.

## 2020-04-28 LAB — LIPID PANEL
Cholesterol: 179 mg/dL (ref 0–200)
HDL: 88 mg/dL (ref >40)
LDL Cholesterol: 82 mg/dL (ref 0–99)
Total CHOL/HDL Ratio: 2 RATIO
Triglycerides: 45 mg/dL (ref <150)
VLDL: 9 mg/dL (ref 0–40)

## 2020-04-28 LAB — BASIC METABOLIC PANEL
Anion gap: 10 (ref 5–15)
BUN: 16 mg/dL (ref 6–20)
CO2: 21 mmol/L — ABNORMAL LOW (ref 22–32)
Calcium: 9.3 mg/dL (ref 8.9–10.3)
Chloride: 109 mmol/L (ref 98–111)
Creatinine, Ser: 1.53 mg/dL — ABNORMAL HIGH (ref 0.44–1.00)
GFR calc Af Amer: 43 mL/min — ABNORMAL LOW (ref >60)
GFR calc non Af Amer: 37 mL/min — ABNORMAL LOW (ref >60)
Glucose, Bld: 94 mg/dL (ref 70–99)
Potassium: 3.6 mmol/L (ref 3.5–5.1)
Sodium: 140 mmol/L (ref 135–145)

## 2020-04-28 LAB — HEMOGLOBIN A1C
Hgb A1c MFr Bld: 5.9 % — ABNORMAL HIGH (ref 4.8–5.6)
Mean Plasma Glucose: 122.63 mg/dL

## 2020-04-28 MED ORDER — ADULT MULTIVITAMIN W/MINERALS CH
1.0000 | ORAL_TABLET | Freq: Every day | ORAL | Status: DC
  Administered 2020-04-28 – 2020-05-03 (×6): 1 via ORAL
  Filled 2020-04-28 (×7): qty 1

## 2020-04-28 MED ORDER — LABETALOL HCL 5 MG/ML IV SOLN
10.0000 mg | INTRAVENOUS | Status: DC | PRN
  Administered 2020-04-28 – 2020-04-29 (×3): 10 mg via INTRAVENOUS
  Filled 2020-04-28 (×4): qty 4

## 2020-04-28 MED ORDER — ENSURE ENLIVE PO LIQD
237.0000 mL | Freq: Three times a day (TID) | ORAL | Status: DC
  Administered 2020-04-29: 237 mL via ORAL

## 2020-04-28 NOTE — Progress Notes (Signed)
PROGRESS NOTE  [name] YIR:[id number] DOB: Mar 10, 1961 DOA: 04/26/2020 PCP: Patient, No Pcp Per   LOS: 1 day   Brief Narrative / Interim history: 59 year old female with history of HTN, presents to the hospital with elevated blood pressure and slurred speech.  She reported generalized malaise for couple of days prior to admission.  The night prior to admission has noticed slurring of her speech, checked her blood pressure at home and it was found to be elevated.  She does not take any blood pressure pills at home.  She has a degree of chronic blurred vision this is unchanged.  MRI of the brain showed acute right ACA branch infarct and severe chronic small vessel ischemia.  Subjective / 24h Interval events: -Reports she is doing well this morning, states that her slurred speech has gotten better.  Her left-sided weakness is still there.  Assessment & Plan: Principal Problem Acute CVA-MRI of the brain showed acute right ACA branch infarct.  Neurology consulted and following.  She has been placed on aspirin.  Underwent a 2D echocardiogram which showed an EF of 50-09%, grade 2 diastolic dysfunction. Transcranial Doppler showed no evidence of right to left intracardiac communication.  Underwent a CT angiogram as below  1. Severe right A2 segment narrowing correlating with the acute infarct by the preceding MRI. 2. 55% left paraclinoid ICA stenosis. 50% right cavernous ICA stenosis 3. Diffuse atheromatous change to medium size vessels. Notable bilateral P2 segment stenoses. 4. Mild atherosclerosis in the neck without stenosis or visible embolic source.  -Lipid panel shows an LDL of 82, she was started on a statin -Hemoglobin A1c shows prediabetes at 5.9. -Therapy evaluation pending  Active Problems Hypertensive emergency-persistently elevated blood pressure into the 230s over 130s, add labetalol as needed to maintain systolic less than 381.  Gradually normalize blood pressure in 5 days.  She  is not on any antihypertensive regimen at home will need initiation while in the hospital.  Elevated creatinine, suspect chronic kidney disease stage IIIb-normal renal function thousand 17 but no recent visits, does not have a PCP.  Suspect creatinine is chronically elevated due to uncontrolled and untreated hypertension.  Received IV fluids overnight for a creatinine of 1.8 on admission and improved to some extent to 1.53.   Scheduled Meds: . aspirin  325 mg Oral Daily   Or  . aspirin  300 mg Rectal Daily  . atorvastatin  40 mg Oral Daily  . clopidogrel  75 mg Oral Daily  . enoxaparin (LOVENOX) injection  40 mg Subcutaneous Q24H   Continuous Infusions: . 0.9 % NaCl with KCl 20 mEq / L 100 mL/hr at 04/28/20 0843   PRN Meds:.acetaminophen **OR** acetaminophen (TYLENOL) oral liquid 160 mg/5 mL **OR** acetaminophen, labetalol, senna-docusate  Diet Orders (From admission, onward)    Start     Ordered   04/27/20 1631  Diet Heart Room service appropriate? Yes; Fluid consistency: Thin  Diet effective now       Question Answer Comment  Room service appropriate? Yes   Fluid consistency: Thin      04/27/20 1630          DVT prophylaxis: enoxaparin (LOVENOX) injection 40 mg Start: 04/27/20 0845    Code Status: DNR  Family Communication: no family at bedside   Status is: Inpatient  Remains inpatient appropriate because:Inpatient level of care appropriate due to severity of illness   Dispo: The patient is from: Home  Anticipated d/c is to: TBD              Anticipated d/c date is: 2 days              Patient currently is not medically stable to d/c.  Consultants:  Neurology   Procedures:  2d echo  Microbiology  None   Antimicrobials: None    Objective: Vitals:   04/27/20 2044 04/27/20 2328 04/28/20 0500 04/28/20 0900  BP: (!) 200/95 (!) 190/97 (!) 177/120 (!) 239/130  Pulse: 87 92 95 92  Resp: 18 16 18 16   Temp: 98.5 F (36.9 C) 98.6 F (37 C) 98.8  F (37.1 C) 98.4 F (36.9 C)  TempSrc: Oral Oral Oral Oral  SpO2: 100% 100% 100% 100%  Weight:      Height:        Intake/Output Summary (Last 24 hours) at 04/28/2020 1038 Last data filed at 04/28/2020 0400 Gross per 24 hour  Intake 1695 ml  Output --  Net 1695 ml   Filed Weights   04/26/20 2127  Weight: 72.6 kg    Examination:  Constitutional: NAD Eyes: no scleral icterus ENMT: Mucous membranes are moist.  Neck: normal, supple Respiratory: clear to auscultation bilaterally, no wheezing, no crackles. Cardiovascular: Regular rate and rhythm, no murmurs / rubs / gallops.  Abdomen: non distended, no tenderness. Bowel sounds positive.  Musculoskeletal: no clubbing / cyanosis.  Skin: no rashes Neurologic: 4/5 left upper and left lower extremity weakness Psychiatric: Normal judgment and insight. Alert and oriented x 3. Normal mood.   Data Reviewed: I have independently reviewed following labs and imaging studies   CBC: Recent Labs  Lab 04/26/20 2135 04/27/20 0237 04/27/20 0246  WBC 9.9  --   --   NEUTROABS  --  7.2  --   HGB 12.5  --  13.3  HCT 39.0  --  39.0  MCV 89.0  --   --   PLT 143*  --   --    Basic Metabolic Panel: Recent Labs  Lab 04/26/20 2135 04/27/20 0246  NA 141 142  K 2.9* 3.1*  CL 105 103  CO2 24  --   GLUCOSE 119* 113*  BUN 26* 28*  CREATININE 1.81* 1.80*  CALCIUM 9.6  --    Liver Function Tests: No results for input(s): AST, ALT, ALKPHOS, BILITOT, PROT, ALBUMIN in the last 168 hours. Coagulation Profile: Recent Labs  Lab 04/27/20 0237  INR 1.1   HbA1C: Recent Labs    04/28/20 0221  HGBA1C 5.9*   CBG: No results for input(s): GLUCAP in the last 168 hours.  Recent Results (from the past 240 hour(s))  SARS Coronavirus 2 by RT PCR (hospital order, performed in [facility] hospital lab) Nasopharyngeal Nasopharyngeal Swab     Status: None   Collection Time: 04/27/20  3:51 AM   Specimen: Nasopharyngeal Swab  Result Value Ref Range  Status   SARS Coronavirus 2 NEGATIVE NEGATIVE Final    Comment: (NOTE) SARS-CoV-2 target nucleic acids are NOT DETECTED.  The SARS-CoV-2 RNA is generally detectable in upper and lower respiratory specimens during the acute phase of infection. The lowest concentration of SARS-CoV-2 viral copies this assay can detect is 250 copies / mL. A negative result does not preclude SARS-CoV-2 infection and should not be used as the sole basis for treatment or other patient management decisions.  A negative result may occur with improper specimen collection / handling, submission of specimen other than nasopharyngeal swab, presence  of viral mutation(s) within the areas targeted by this assay, and inadequate number of viral copies (<250 copies / mL). A negative result must be combined with clinical observations, patient history, and epidemiological information.  Fact Sheet for Patients:   StrictlyIdeas.no  Fact Sheet for Healthcare Providers: BankingDealers.co.za  This test is not yet approved or  cleared by the [place] FDA and has been authorized for detection and/or diagnosis of SARS-CoV-2 by FDA under an Emergency Use Authorization (EUA).  This EUA will remain in effect (meaning this test can be used) for the duration of the COVID-19 declaration under Section 564(b)(1) of the Act, 21 U.S.C. section 360bbb-3(b)(1), unless the authorization is terminated or revoked sooner.  Performed at [facility], [place] [address] [place], [place] [address]      Radiology Studies: VAS [place] TRANSCRANIAL DOPPLER W BUBBLES  Result Date: 04/27/2020  Transcranial Doppler with Bubble Indications: Stroke. Performing Technologist: Abram Sander RVS  Examination Guidelines: A complete evaluation includes B-mode imaging, spectral Doppler, color Doppler, and power Doppler as needed of all accessible portions of each vessel. Bilateral testing is considered an  integral part of a complete examination. Limited examinations for reoccurring indications may be performed as noted.  Summary: No HITS at rest or during Valsalva. Negative transcranial Doppler Bubble study with no evidence of right to left intracardiac communication.  A vascular evaluation was performed. The right middle cerebral artery was studied. An IV was inserted into the patient's right hand. Verbal informed consent was obtained.  *See table(s) above for TCD measurements and observations.    Preliminary    ECHOCARDIOGRAM COMPLETE  Result Date: 04/27/2020    ECHOCARDIOGRAM REPORT   Patient Name:   [facility] Date of Exam: 04/27/2020 Medical Rec #:  [id number]     Height:       64.0 in Accession #:    [id number]    Weight:       160.0 lb Date of Birth:  1961/06/29     BSA:          1.779 m Patient Age:    34 years      BP:           155/83 mmHg Patient Gender: F             HR:           88 bpm. Exam Location:  Inpatient Procedure: 2D Echo Indications:    stroke 434.91  History:        Patient has no prior history of Echocardiogram examinations.                 Risk Factors:Hypertension.  Sonographer:    Jannett Celestine RDCS (AE) Referring Phys: [id number] JENNIFER YATES  Sonographer Comments: restricted mobility, specifically patient's left side was lacking mobility. patient unable to turn on left side as a result IMPRESSIONS  1. Left ventricular ejection fraction, by estimation, is 60 to 65%. The left ventricle has normal function. The left ventricle has no regional wall motion abnormalities. There is mild left ventricular hypertrophy. Left ventricular diastolic parameters are consistent with Grade II diastolic dysfunction (pseudonormalization). Elevated left ventricular end-diastolic pressure.  2. Right ventricular systolic function is normal. The right ventricular size is normal.  3. The mitral valve is normal in structure. Trivial mitral valve regurgitation. No evidence of mitral stenosis.  4. The aortic valve  is normal in structure. Aortic valve regurgitation is not visualized. Mild aortic valve sclerosis is present, with no  evidence of aortic valve stenosis.  5. The inferior vena cava is normal in size with greater than 50% respiratory variability, suggesting right atrial pressure of 3 mmHg. FINDINGS  Left Ventricle: Left ventricular ejection fraction, by estimation, is 60 to 65%. The left ventricle has normal function. The left ventricle has no regional wall motion abnormalities. The left ventricular internal cavity size was normal in size. There is  mild left ventricular hypertrophy. Left ventricular diastolic parameters are consistent with Grade II diastolic dysfunction (pseudonormalization). Elevated left ventricular end-diastolic pressure. Right Ventricle: The right ventricular size is normal. No increase in right ventricular wall thickness. Right ventricular systolic function is normal. Left Atrium: Left atrial size was normal in size. Right Atrium: Right atrial size was normal in size. Pericardium: There is no evidence of pericardial effusion. Mitral Valve: The mitral valve is normal in structure. There is mild thickening of the mitral valve leaflet(s). Normal mobility of the mitral valve leaflets. Trivial mitral valve regurgitation. No evidence of mitral valve stenosis. Tricuspid Valve: The tricuspid valve is normal in structure. Tricuspid valve regurgitation is not demonstrated. No evidence of tricuspid stenosis. Aortic Valve: The aortic valve is normal in structure. Aortic valve regurgitation is not visualized. Mild aortic valve sclerosis is present, with no evidence of aortic valve stenosis. Pulmonic Valve: The pulmonic valve was normal in structure. Pulmonic valve regurgitation is not visualized. No evidence of pulmonic stenosis. Aorta: The aortic root is normal in size and structure. Venous: The inferior vena cava is normal in size with greater than 50% respiratory variability, suggesting right atrial  pressure of 3 mmHg. IAS/Shunts: No atrial level shunt detected by color flow Doppler.  LEFT VENTRICLE PLAX 2D LVIDd:         4.80 cm  Diastology LVIDs:         3.60 cm  LV e' lateral:   4.79 cm/s LV PW:         1.30 cm  LV E/e' lateral: 20.6 LV IVS:        1.30 cm  LV e' medial:    6.64 cm/s LVOT diam:     1.90 cm  LV E/e' medial:  14.8 LV SV:         45 LV SV Index:   25 LVOT Area:     2.84 cm  RIGHT VENTRICLE TAPSE (M-mode): 1.6 cm LEFT ATRIUM           Index LA diam:      3.70 cm 2.08 cm/m LA Vol (A2C): 71.5 ml 40.18 ml/m  AORTIC VALVE LVOT Vmax:   112.00 cm/s LVOT Vmean:  69.400 cm/s LVOT VTI:    0.158 m  AORTA Ao Root diam: 3.00 cm MITRAL VALVE MV Area (PHT): 3.37 cm     SHUNTS MV Decel Time: 225 msec     Systemic VTI:  0.16 m MV E velocity: 98.50 cm/s   Systemic Diam: 1.90 cm MV A velocity: 106.00 cm/s MV E/A ratio:  0.93 Jenkins Rouge MD Electronically signed by Jenkins Rouge MD Signature Date/Time: 04/27/2020/1:33:36 PM    Final    Marzetta Board, MD, PhD [facility]  Between 7 am - 7 pm I am available, please contact me via Amion or Securechat  Between 7 pm - 7 am I am not available, please contact night coverage MD/APP via Amion

## 2020-04-28 NOTE — Evaluation (Signed)
Occupational Therapy Evaluation Patient Details Name: [name] MRN: [id number] DOB: March 26, 1961 Today's Date: 04/28/2020    History of Present Illness Pt is a 59 y/o female with PMH of HTN, presenting with elevated BP, L hemiparesis, and slurred speech. MRI reveals acute R ACA CVA.    Clinical Impression   PTA patient independent, working and driving. Admitted for above and limited by problem list below, including L hemiparesis, impaired balance, decreased activity tolerance, and impaired cognition.  Patient oriented and following simple commands, but noted difficulty following multiple step commands, attending to L side, attending to task, and problem solving.  Patient currently requires mod assist for grooming, mod-max assist for UB ADLs and max-total assist +2 for LB ADLS, max +2 to total assist +2 for bed mobility and sit to stand at EOB.  Patient with heavy L lateral and posterior lean, able to progress to min assist at EOB only when lateral leaning on R elbow but requires max-total assist to position.  Daughter present at completion of session and reports family could figure out a schedule of 24/7 support at discharge.  Discussed need for maximove at this time with NT.  Patient will benefit from continued OT services while admitted and after dc at CIR level to optimize independence and safety with ADls, IADLs and mobiilty.     Follow Up Recommendations  CIR;Supervision/Assistance - 24 hour    Equipment Recommendations  Other (comment) (TBD at next venue of care)    Recommendations for Other Services Rehab consult     Precautions / Restrictions Precautions Precautions: Other (comment);Fall Precaution Comments: L hemi Restrictions Weight Bearing Restrictions: No      Mobility Bed Mobility Overal bed mobility: Needs Assistance Bed Mobility: Supine to Sit;Sit to Sidelying;Rolling Rolling: Mod assist;+2 for physical assistance;+2 for safety/equipment   Supine to sit: Max  assist;+2 for physical assistance;HOB elevated;+2 for safety/equipment   Sit to sidelying: Total assist;+2 for safety/equipment;+2 for physical assistance General bed mobility comments: initated reaching with R hand to L rail and move R LE but overall requires max assist +2 to fully transition to EOB   Transfers Overall transfer level: Needs assistance Equipment used: 2 person hand held assist Transfers: Sit to/from Stand Sit to Stand: Total assist;+2 physical assistance;+2 safety/equipment;From elevated surface         General transfer comment: total assist +2 to power up and steady from EOB, full reliance on external support    Balance Overall balance assessment: Needs assistance Sitting-balance support: No upper extremity supported;Feet supported Sitting balance-Leahy Scale: Zero Sitting balance - Comments: patient able to maintain sitting balance at best statically with min assist IF leaning on R elbow, otherwise requires max assist; prefernce to L and posteiror lean  Postural control: Posterior lean;Left lateral lean Standing balance support: Bilateral upper extremity supported;During functional activity Standing balance-Leahy Scale: Zero                             ADL either performed or assessed with clinical judgement   ADL Overall ADL's : Needs assistance/impaired     Grooming: Moderate assistance;Bed level Grooming Details (indicate cue type and reason): able to wash face using R hand, but mod asist required for bimanual tasks  Upper Body Bathing: Moderate assistance;Sitting   Lower Body Bathing: +2 for physical assistance;+2 for safety/equipment;Sit to/from stand;Total assistance   Upper Body Dressing : Maximal assistance;Sitting   Lower Body Dressing: Total assistance;+2 for physical assistance;+2 for safety/equipment;Sit  to/from Health and safety inspector Details (indicate cue type and reason): deferred due to safety         Functional mobility  during ADLs: Maximal assistance;+2 for physical assistance;+2 for safety/equipment;Total assistance General ADL Comments: pt limited by impaired cognition, L sided hemiparesis, and impaired balance      Vision Baseline Vision/History: Wears glasses Wears Glasses: At all times Patient Visual Report: No change from baseline Additional Comments: brief assessment completed, patient able to scan to L and R but unable to follow multiple step commands for further testing; wears glasses- not here; denies blurry or diplopia but cueing to keep eyes open (reports due to fatigue)      Perception Perception Perception Tested?: Yes Perception Deficits: Inattention/neglect Inattention/Neglect: Does not attend to left visual field;Does not attend to left side of body Comments: some inattention to L side--enviornment and body, further assessment needed   Praxis      Pertinent Vitals/Pain Pain Assessment: No/denies pain     Hand Dominance Right   Extremity/Trunk Assessment Upper Extremity Assessment Upper Extremity Assessment: LUE deficits/detail LUE Deficits / Details: flexion synergy patterns noted, inital tone in shoulder but fades with PROM, grossly 0/5 shoulder and elbow, 3-/5 hand ; unable to use functionally LUE Sensation: decreased light touch;decreased proprioception LUE Coordination: decreased fine motor;decreased gross motor   Lower Extremity Assessment Lower Extremity Assessment: Defer to PT evaluation       Communication Communication Communication: Expressive difficulties   Cognition Arousal/Alertness: Lethargic (improves with engagement ) Behavior During Therapy: Flat affect (labile at times ) Overall Cognitive Status: Impaired/Different from baseline Area of Impairment: Attention;Memory;Safety/judgement;Following commands;Awareness;Problem solving                   Current Attention Level: Sustained Memory: Decreased short-term memory;Decreased recall of  precautions Following Commands: Follows one step commands with increased time;Follows one step commands consistently;Follows multi-step commands inconsistently Safety/Judgement: Decreased awareness of safety;Decreased awareness of deficits Awareness: Emergent Problem Solving: Slow processing;Decreased initiation;Difficulty sequencing;Requires verbal cues;Requires tactile cues General Comments: patient oriented, follows simple commands with increased time but inconsistent with mulitple step commands, decreased awareness to deficits    General Comments  daughter present at end of session, supportive and reports family can provide 24/7 support as needed    Exercises     Shoulder Instructions      Home Living Family/patient expects to be discharged to:: Private residence Living Arrangements: Children Available Help at Discharge: Family Type of Home: House Home Access: Stairs to enter Technical brewer of Steps: 2 Entrance Stairs-Rails: Right;Left Home Layout: One level     Bathroom Shower/Tub: Teacher, early years/pre: Standard     Home Equipment: None      Lives With: Son    Prior Functioning/Environment Level of Independence: Independent        Comments: independent and working at [employer], driving         OT Problem List: Decreased strength;Decreased range of motion;Decreased activity tolerance;Impaired balance (sitting and/or standing);Impaired vision/perception;Decreased coordination;Decreased cognition;Decreased safety awareness;Decreased knowledge of use of DME or AE;Decreased knowledge of precautions;Impaired sensation;Impaired tone;Obesity;Impaired UE functional use      OT Treatment/Interventions: Self-care/ADL training;Neuromuscular education;DME and/or AE instruction;Therapeutic activities;Cognitive remediation/compensation;Visual/perceptual remediation/compensation;Patient/family education;Balance training;Manual therapy    OT Goals(Current  goals can be found in the care plan section) Acute Rehab OT Goals Patient Stated Goal: to get better  OT Goal Formulation: With patient Time For Goal Achievement: 05/12/20 Potential to Achieve Goals: Good  OT Frequency:  Min 2X/week   Barriers to D/C:            Co-evaluation PT/OT/SLP Co-Evaluation/Treatment: Yes Reason for Co-Treatment: For patient/therapist safety;To address functional/ADL transfers   OT goals addressed during session: ADL's and self-care      AM-PAC OT "6 Clicks" Daily Activity     Outcome Measure Help from another person eating meals?: A Lot Help from another person taking care of personal grooming?: A Lot Help from another person toileting, which includes using toliet, bedpan, or urinal?: Total Help from another person bathing (including washing, rinsing, drying)?: A Lot Help from another person to put on and taking off regular upper body clothing?: A Lot Help from another person to put on and taking off regular lower body clothing?: Total 6 Click Score: 10   End of Session Equipment Utilized During Treatment: Gait belt Nurse Communication: Mobility status  Activity Tolerance: Patient tolerated treatment well Patient left: in bed;with call bell/phone within reach;with bed alarm set;with family/visitor present  OT Visit Diagnosis: Other abnormalities of gait and mobility (R26.89);Other symptoms and signs involving cognitive function;Hemiplegia and hemiparesis Hemiplegia - Right/Left: Left Hemiplegia - dominant/non-dominant: Non-Dominant Hemiplegia - caused by: Cerebral infarction                Time: 0925-1003 OT Time Calculation (min): 38 min Charges:  OT General Charges $OT Visit: 1 Visit OT Evaluation $OT Eval Moderate Complexity: 1 Mod OT Treatments $Self Care/Home Management : 8-22 mins  Jolaine Artist, OT Acute Rehabilitation Services Pager [phone] Office [phone]    [name] 04/28/2020, 10:57 AM

## 2020-04-28 NOTE — Consult Note (Signed)
Physical Medicine and Rehabilitation Consult Reason for Consult: Left hemiparesis and slurred speech Referring Physician: [facility]  HPI: [name] is a 59 y.o. right-handed female with history of hypertension not on medication.  History taken from chart review and patient.  Patient lives with her 32 year old son and 2 adult daughters.  1 level home 2 steps to entry.  Independent prior to admission working at [employer].  She presented on 04/27/2020 with left hemiparesis and dysarthria.  Systolic blood pressure 409 and diastolic 811.  Admission chemistries potassium 2.9, BUN 26 creatinine 1.81, alcohol negative, urine drug screen negative.  CT showed hypoattenuating focus in the right thalamus age likely reflecting sequela of lacuna type infarct likely remote though in the absence of comparison overall age-indeterminate.  Patient did not receive TPA.  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  Severe chronic small vessel ischemia.  CT angiogram of head and neck severe right A2 segment narrowing correlating with acute infarct of the preceding MRI.  50% right cavernous ICA stenosis 55% left paraclinoid ICA stenosis.  Echocardiogram with ejection fraction of 91%, grade 2 diastolic dysfunction.  Presently on aspirin and Plavix for CVA prophylaxis x3 weeks then aspirin alone.  Subcutaneous Lovenox for DVT prophylaxis.  Permissive hypertension close monitoring.  Therapy evaluations completed with recommendations of physical medicine rehab consult.  Review of Systems  Constitutional: Positive for malaise/fatigue. Negative for chills and fever.  HENT: Negative for hearing loss.   Eyes: Negative for blurred vision and double vision.  Respiratory: Negative for cough and shortness of breath.   Cardiovascular: Negative for chest pain and leg swelling.  Gastrointestinal: Positive for constipation. Negative for heartburn, nausea and vomiting.  Genitourinary: Negative for dysuria and hematuria.    Musculoskeletal: Positive for myalgias.  Skin: Negative for rash.  Neurological: Positive for dizziness, speech change, focal weakness, weakness and headaches.  All other systems reviewed and are negative.  Past Medical History:  Diagnosis Date  . Arthritis   . Hypertension    not taking medications   Past Surgical History:  Procedure Laterality Date  . CESAREAN SECTION     Family History  Problem Relation Age of Onset  . Cancer Mother   . Hypertension Father   . Heart disease Father   . Heart disease Brother   . Stroke Neg Hx    Social History:  reports that she has never smoked. She has never used smokeless tobacco. She reports that she does not drink alcohol and does not use drugs. Allergies: No Known Allergies Medications Prior to Admission  Medication Sig Dispense Refill  . ibuprofen (ADVIL) 200 MG tablet Take 400 mg by mouth every 6 (six) hours as needed for headache or mild pain.      Home: Home Living Family/patient expects to be discharged to:: Private residence Living Arrangements: Children Available Help at Discharge: Family Type of Home: House Home Access: Stairs to enter Technical brewer of Steps: 2 Entrance Stairs-Rails: Right, Left Home Layout: One level Bathroom Shower/Tub: Chiropodist: Standard Home Equipment: None Additional Comments: pt lives with 76 y.o. son and has 2 adult daughters who are able to assist as well. Pt's daughter [name] works weekends and is available during weekdays for support/assist.  Lives With: Son  Functional History: Prior Function Level of Independence: Independent Comments: independent and working at [employer], driving  Functional Status:  Mobility: Bed Mobility Overal bed mobility: Needs Assistance Bed Mobility: Supine to Sit, Sit to Sidelying, Rolling Rolling: Mod assist, +  2 for physical assistance, +2 for safety/equipment Supine to sit: Max assist, +2 for physical assistance, HOB  elevated, +2 for safety/equipment Sit to sidelying: Total assist, +2 for safety/equipment, +2 for physical assistance General bed mobility comments: pt able to initiate Rt UE reaching to Lt bed rail and initated Rt LE movement to EOB, Max assist for Lt LE movement and max +2 to complete roll and sit up EOB. Total assist to return to supine and reposition in bed. Transfers Overall transfer level: Needs assistance Equipment used: 2 person hand held assist Transfers: Sit to/from Stand Sit to Stand: Total assist, +2 physical assistance, +2 safety/equipment, From elevated surface General transfer comment: total assist +2 to power up and steady from EOB, full reliance on external support. pt able to rise ~75% to full stand.      ADL: ADL Overall ADL's : Needs assistance/impaired Grooming: Moderate assistance, Bed level Grooming Details (indicate cue type and reason): able to wash face using R hand, but mod asist required for bimanual tasks  Upper Body Bathing: Moderate assistance, Sitting Lower Body Bathing: +2 for physical assistance, +2 for safety/equipment, Sit to/from stand, Total assistance Upper Body Dressing : Maximal assistance, Sitting Lower Body Dressing: Total assistance, +2 for physical assistance, +2 for safety/equipment, Sit to/from stand Toilet Transfer Details (indicate cue type and reason): deferred due to safety Functional mobility during ADLs: Maximal assistance, +2 for physical assistance, +2 for safety/equipment, Total assistance General ADL Comments: pt limited by impaired cognition, L sided hemiparesis, and impaired balance   Cognition: Cognition Overall Cognitive Status: Impaired/Different from baseline Arousal/Alertness: Awake/alert Orientation Level: Oriented X4 Attention: Sustained Sustained Attention: Impaired Memory: Impaired Memory Impairment: Retrieval deficit (3/5 recalled without cues, 1/5 with category cue, 1/5 with multiple choice) Awareness: Appears  intact (after difficulty performing test items, pt able to state difficulty with attention) Problem Solving: Impaired Problem Solving Impairment: Functional basic (clock drawing impaired numbering 5, 10, 15, etc..) Behaviors: Restless, Impulsive Safety/Judgment: Impaired Comments: pt able to verbalize she needs to have help at home Cognition Arousal/Alertness: Lethargic (improves with engagement ) Behavior During Therapy: Flat affect (labile at times ) Overall Cognitive Status: Impaired/Different from baseline Area of Impairment: Attention, Memory, Safety/judgement, Following commands, Awareness, Problem solving Current Attention Level: Sustained Memory: Decreased short-term memory, Decreased recall of precautions Following Commands: Follows one step commands with increased time, Follows one step commands consistently, Follows multi-step commands inconsistently Safety/Judgement: Decreased awareness of safety, Decreased awareness of deficits Awareness: Emergent Problem Solving: Slow processing, Decreased initiation, Difficulty sequencing, Requires verbal cues, Requires tactile cues General Comments: patient oriented, follows simple commands with increased time but inconsistent with mulitple step commands, decreased awareness to deficits but becomes aware of inability to complete tasks with testing. Pt tearful at EOS when daughter walks in and states "I can't do anything for myself".  Blood pressure (!) 213/117, pulse 80, temperature 98.6 F (37 C), temperature source Oral, resp. rate 20, height 5\' 4"  (1.626 m), weight 72.6 kg, SpO2 100 %. Physical Exam Constitutional:      General: She is not in acute distress.    Appearance: She is normal weight.  HENT:     Head: Normocephalic and atraumatic.     Right Ear: External ear normal.     Left Ear: External ear normal.     Nose: Nose normal.  Eyes:     General:        Right eye: No discharge.        Left eye: No discharge.  Extraocular  Movements: Extraocular movements intact.  Cardiovascular:     Rate and Rhythm: Normal rate and regular rhythm.  Pulmonary:     Effort: Pulmonary effort is normal. No respiratory distress.     Breath sounds: Normal breath sounds. No stridor.  Abdominal:     General: Abdomen is flat. Bowel sounds are normal. There is no distension.  Musculoskeletal:     Cervical back: Normal range of motion and neck supple.     Comments: No edema or tenderness in extremities  Skin:    General: Skin is warm and dry.  Neurological:     Mental Status: She is alert.     Comments: Follows commands. Alert Dysarthria Motor: RUE/RLE: 5/5 proximal distal LUE: Shoulder abduction, elbow flexion/extension 0/5, handgrip 1+/5 Left lower extremity: Hip flexion, knee extension 2+/5, ankle dorsiflexion 3+/5 with apraxia Sensation intact light touch  Psychiatric:        Mood and Affect: Mood normal.        Behavior: Behavior normal.     Results for orders placed or performed during the hospital encounter of 04/26/20 (from the past 24 hour(s))  Basic metabolic panel     Status: Abnormal   Collection Time: 04/29/20  2:07 AM  Result Value Ref Range   Sodium 140 135 - 145 mmol/L   Potassium 3.6 3.5 - 5.1 mmol/L   Chloride 111 98 - 111 mmol/L   CO2 21 (L) 22 - 32 mmol/L   Glucose, Bld 90 70 - 99 mg/dL   BUN 18 6 - 20 mg/dL   Creatinine, Ser 1.59 (H) 0.44 - 1.00 mg/dL   Calcium 9.1 8.9 - 10.3 mg/dL   GFR calc non Af Amer 35 (L) >60 mL/min   GFR calc Af Amer 41 (L) >60 mL/min   Anion gap 8 5 - 15  CBC     Status: Abnormal   Collection Time: 04/29/20  2:07 AM  Result Value Ref Range   WBC 9.4 4.0 - 10.5 K/uL   RBC 3.79 (L) 3.87 - 5.11 MIL/uL   Hemoglobin 10.7 (L) 12.0 - 15.0 g/dL   HCT 33.7 (L) 36 - 46 %   MCV 88.9 80.0 - 100.0 fL   MCH 28.2 26.0 - 34.0 pg   MCHC 31.8 30.0 - 36.0 g/dL   RDW 14.6 11.5 - 15.5 %   Platelets 120 (L) 150 - 400 K/uL   nRBC 0.0 0.0 - 0.2 %   VAS [place] TRANSCRANIAL DOPPLER W  BUBBLES  Result Date: 04/28/2020  Transcranial Doppler with Bubble Indications: Stroke. Performing Technologist: Abram Sander RVS  Examination Guidelines: A complete evaluation includes B-mode imaging, spectral Doppler, color Doppler, and power Doppler as needed of all accessible portions of each vessel. Bilateral testing is considered an integral part of a complete examination. Limited examinations for reoccurring indications may be performed as noted.  Summary: No HITS at rest or during Valsalva. Negative transcranial Doppler Bubble study with no evidence of right to left intracardiac communication.  A vascular evaluation was performed. The right middle cerebral artery was studied. An IV was inserted into the patient's right hand. Verbal informed consent was obtained.  Negative TCD Bubble study *See table(s) above for TCD measurements and observations.  Diagnosing physician: Antony Contras MD Electronically signed by Antony Contras MD on 04/28/2020 at 12:52:40 PM.    Final     Assessment/Plan: Diagnosis: right ACA infarct.   Stroke: Continue secondary stroke prophylaxis and Risk Factor Modification listed below:   Antiplatelet  therapy:   Blood Pressure Management:  Continue current medication with prn's with permisive HTN per primary team Statin Agent:   Prediabetes management:   Left sided hemiparesis: fit for orthosis to prevent contractures (resting hand splint for day, wrist cock up splint at night, PRAFO, etc) PT/OT for mobility, ADL training  Motor recovery: Zoloft Labs independently reviewed.  Records reviewed and summated above.  1. Does the need for close, 24 hr/day medical supervision in concert with the patient's rehab needs make it unreasonable for this patient to be served in a less intensive setting? Yes  2. Co-Morbidities requiring supervision/potential complications: Acute combined CHF (Monitor in accordance with increased physical activity and avoid UE resistance excercises), HTN  (monitor and provide prns in accordance with increased physical exertion and pain), prediabetes (Monitor in accordance with exercise and adjust meds as necessary), Thrombocytopenia (< 60,000/mm3 no resistive exercise), CKD III (repeat labs, encourage fluids, avoid nephrotoxic meds) 3. Due to bladder management, bowel management, safety, skin/wound care, disease management, medication administration and patient education, does the patient require 24 hr/day rehab nursing? Yes 4. Does the patient require coordinated care of a physician, rehab nurse, therapy disciplines of PT/OT/SLP to address physical and functional deficits in the context of the above medical diagnosis(es)? Yes Addressing deficits in the following areas: balance, endurance, locomotion, strength, transferring, bowel/bladder control, bathing, dressing, feeding, grooming, toileting, speech and psychosocial support 5. Can the patient actively participate in an intensive therapy program of at least 3 hrs of therapy per day at least 5 days per week? Yes 6. The potential for patient to make measurable gains while on inpatient rehab is excellent 7. Anticipated functional outcomes upon discharge from inpatient rehab are min assist  with PT, min assist with OT, modified independent with SLP. 8. Estimated rehab length of stay to reach the above functional goals is: 17-21 days. 9. Anticipated discharge destination: Home 10. Overall Rehab/Functional Prognosis: good  RECOMMENDATIONS: This patient's condition is appropriate for continued rehabilitative care in the following setting: CIR if adequate caregiver support available upon discharge Patient has agreed to participate in recommended program. Yes Note that insurance prior authorization may be required for reimbursement for recommended care.  Comment: Rehab Admissions Coordinator to follow up.  I have personally performed a face to face diagnostic evaluation, including, but not limited to  relevant history and physical exam findings, of this patient and developed relevant assessment and plan.  Additionally, I have reviewed and concur with the physician assistant's documentation above.   Delice Lesch, MD, ABPMR Lavon Paganini Angiulli, PA-C 04/29/2020

## 2020-04-28 NOTE — Progress Notes (Signed)
STROKE TEAM PROGRESS NOTE   INTERVAL HISTORY Patient `s daughter is present at the bedside. She notes improvement in left upper extremity strength but left leg still remains quite plegic.  TCD bubble study done yesterday was negative.  Echocardiogram was unremarkable.  LDL cholesterol is 82 mg percent and hemoglobin A1c is 5.9.  She has been seen by therapist recommend inpatient rehab.  Blood pressure remains significantly elevated  Vitals:   04/28/20 1022 04/28/20 1130 04/28/20 1145 04/28/20 1159  BP: (!) 236/130 (!) 240/120 (!) 213/111 (!) 227/117  Pulse: 98  85 85  Resp: 18  18 18   Temp: 98.4 F (36.9 C)   98.8 F (37.1 C)  TempSrc: Oral   Oral  SpO2: 100%  100% 100%  Weight:      Height:       CBC:  Recent Labs  Lab 04/26/20 2135 04/27/20 0237 04/27/20 0246  WBC 9.9  --   --   NEUTROABS  --  7.2  --   HGB 12.5  --  13.3  HCT 39.0  --  39.0  MCV 89.0  --   --   PLT 143*  --   --    Basic Metabolic Panel:  Recent Labs  Lab 04/26/20 2135 04/26/20 2135 04/27/20 0246 04/28/20 0936  NA 141   < > 142 140  K 2.9*   < > 3.1* 3.6  CL 105   < > 103 109  CO2 24  --   --  21*  GLUCOSE 119*   < > 113* 94  BUN 26*   < > 28* 16  CREATININE 1.81*   < > 1.80* 1.53*  CALCIUM 9.6  --   --  9.3   < > = values in this interval not displayed.   Lipid Panel:  Recent Labs  Lab 04/28/20 0221  CHOL 179  TRIG 45  HDL 88  CHOLHDL 2.0  VLDL 9  LDLCALC 82   HgbA1c:  Recent Labs  Lab 04/28/20 0221  HGBA1C 5.9*   Urine Drug Screen:  Recent Labs  Lab 04/27/20 0914  LABOPIA NONE DETECTED  COCAINSCRNUR NONE DETECTED  LABBENZ NONE DETECTED  AMPHETMU NONE DETECTED  THCU NONE DETECTED  LABBARB NONE DETECTED    Alcohol Level  Recent Labs  Lab 04/27/20 0237  ETH <10    IMAGING past 24 hours VAS [place] TRANSCRANIAL DOPPLER W BUBBLES  Result Date: 04/28/2020  Transcranial Doppler with Bubble Indications: Stroke. Performing Technologist: [name] RVS  Examination  Guidelines: A complete evaluation includes B-mode imaging, spectral Doppler, color Doppler, and power Doppler as needed of all accessible portions of each vessel. Bilateral testing is considered an integral part of a complete examination. Limited examinations for reoccurring indications may be performed as noted.  Summary: No HITS at rest or during Valsalva. Negative transcranial Doppler Bubble study with no evidence of right to left intracardiac communication.  A vascular evaluation was performed. The right middle cerebral artery was studied. An IV was inserted into the patient's right hand. Verbal informed consent was obtained.  Negative TCD Bubble study *See table(s) above for TCD measurements and observations.  Diagnosing physician: Tonya Contras MD Electronically signed by Tonya Contras MD on 04/28/2020 at 12:52:40 PM.    Final     PHYSICAL EXAM Mildly obese middle-aged African-American lady not in distress. . Afebrile. Head is nontraumatic. Neck is supple without bruit.    Cardiac exam no murmur or gallop. Lungs are clear to auscultation. Distal  pulses are well felt. Neurological Exam :  Awake alert and interactive.  Mild dysarthria no aphasia.  Extraocular movements full range without nystagmus.  Blinks to threat bilaterally.  Mild left lower facial weakness.  Tongue midline.  Motor system exam shows mild left upper  drift with weakness of left grip intrinsic hand muscles .left lower extremity is plegic with barely 1/5 strength.  Sensation diminished on the left compared to the right.  Coordination is slow on the left compared to the right.  Gait not tested. ASSESSMENT/PLAN [name] is a 59 y.o. female with history of HTN, OA presenting with slurred speech, L hemiparesis and hypertensive emervency.   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 hypoattenuation R thalamic ? Acute infarct. Chronic Small vessel disease + Atrophy. likey B cerumen in  auditory canals. R>L TMJ.   MRI  Acute R ACA infarct. Severe small vessel disease.   CTA head & neck severe R A2 narrowing. L paraclinoid ICA 55%, R cavernous ICA 50%. Diffuse atherosclerosis head aneurysm neck.  Carotid Doppler not needed.  See CTA neck  2D Echo ejection fraction 60 to 65%.  No cardiac source of embolism.  Consider TEE and loop if above workup negative    TCD bubble negative  LDL 82 mg percent  HgbA1c 5.9.  UDS negative  VTE prophylaxis - Lovenox 40 mg sq daily   No antithrombotic prior to admission, now on aspirin 300 mg suppository daily. Passed  swallow, added plavix and decrease aspirin to 81. Continue DAPT x 3 weeks then aspirin alone   Therapy recommendations: CLR  disposition: Inpatient rehab Hypertensive Emergency  SBP as high as 267, consistently > 200  Home meds:  none  Stable . Permissive hypertension (OK if < 220/120) but gradually normalize in 5-7 days . Long-term BP goal normotensive  Hyperlipidemia  Home meds:  No statin  Now on lipitor 40  LDL pending, goal < 70  Continue statin at discharge  Other Stroke Risk Factors  Overweight, Body mass index is 27.46 kg/m., recommend weight loss, diet and exercise as appropriate   Other Active Problems  Renal dysfunction, AKI   Hypokalemia    RA, recent injection of Humira - followed by Berna Bue not felt to be associated with current stroke, though there are some reports of possible association  Hospital day # 1  She presented with slurred speech and left hemiparesis due to right ACA infarct and MRI also shows old lacunar infarcts she has multiple uncontrolled risk factors.. recommend aspirin Plavix for 3 weeks followed by aspirin alone and aggressive risk factor modification.  Long discussion with patient and daughter at the bedside and answered questions.  Transfer to inpatient rehab when bed available.  We will not consider TEE and loop recorder given his significant  uncontrolled risk factors I doubt that she will be compliant with may consider that later as an outpatient if she demonstrates good compliance.  Follow-up as an outpatient stroke clinic in 6 weeks.  Greater than 50% time during the 25-minute visit was spent in counseling and coordination of care about her stroke and discussion about stroke prevention and treatment.  Discussed with Dr. Gloris Ham.  Stroke team will sign off.  Kindly call for questions.   Tonya Contras, MD To contact Stroke Continuity provider, please refer to [email or web address] After hours, contact General Neurology

## 2020-04-28 NOTE — Progress Notes (Signed)
Rehab Admissions Coordinator Note:  Patient was screened by Cleatrice Burke for appropriateness for an Inpatient Acute Rehab Consult. Per therapy recs.   At this time, we are recommending Inpatient Rehab consult. I will place order per protocol.  Cleatrice Burke RN MSN 04/28/2020, 12:29 PM  I can be reached at [phone].

## 2020-04-28 NOTE — Progress Notes (Signed)
This note also relates to the following rows which could not be included: ECG Heart Rate - Cannot attach notes to unvalidated device data MD Notified of elevated BP. No PRN BP meds ordered at the time   04/28/20 1130  Vitals  BP (!) 240/120

## 2020-04-28 NOTE — Evaluation (Signed)
Physical Therapy Evaluation Patient Details Name: [name] MRN: [id number] DOB: 1961/01/19 Today's Date: 04/28/2020   History of Present Illness  Pt is a 59 y/o female with PMH of HTN, presenting with elevated BP, L hemiparesis, and slurred speech. MRI reveals acute Rt ACA CVA.     Clinical Impression  [name] is 59 y.o. female admitted with above HPI and diagnosis. Patient is currently limited by functional impairments below (see PT problem list). Patient lives with her 39 y.o. son, has assist from adult children and was independent and working full time at [employer]. At this time patient is currently requiring Max +2 assist for bed mobility and Total assist for sit<>stand with 2+ HHA. She is limited by Lt inattention and significant Lt lateral lean. Educated daughter and pt to have visitors sit on Lt side of pt to encourage Lt attending. Patient will benefit from continued skilled PT interventions to address impairments and progress independence with mobility, recommending intense therapy follow up at CIR level to maximize return to PLOF. Acute PT will follow and progress as able.      Follow Up Recommendations CIR    Equipment Recommendations  Other (comment) (TBA)    Recommendations for Other Services Rehab consult     Precautions / Restrictions Precautions Precautions: Other (comment);Fall Precaution Comments: L hemi Restrictions Weight Bearing Restrictions: No      Mobility  Bed Mobility Overal bed mobility: Needs Assistance Bed Mobility: Supine to Sit;Sit to Sidelying;Rolling Rolling: Mod assist;+2 for physical assistance;+2 for safety/equipment   Supine to sit: Max assist;+2 for physical assistance;HOB elevated;+2 for safety/equipment   Sit to sidelying: Total assist;+2 for safety/equipment;+2 for physical assistance General bed mobility comments: pt able to initiate Rt UE reaching to Lt bed rail and initated Rt LE movement to EOB, Max assist for Lt LE  movement and max +2 to complete roll and sit up EOB. Total assist to return to supine and reposition in bed.  Transfers Overall transfer level: Needs assistance Equipment used: 2 person hand held assist Transfers: Sit to/from Stand Sit to Stand: Total assist;+2 physical assistance;+2 safety/equipment;From elevated surface         General transfer comment: total assist +2 to power up and steady from EOB, full reliance on external support. pt able to rise ~75% to full stand.  Ambulation/Gait    Stairs         Wheelchair Mobility    Modified Rankin (Stroke Patients Only)       Balance Overall balance assessment: Needs assistance Sitting-balance support: No upper extremity supported;Feet supported Sitting balance-Leahy Scale: Zero Sitting balance - Comments: pt required max assist with tactile cues to obtain seated balance with bil UE support. Cues for Rt forearm support to reduce Lt lean and min assist to maintain this position. Max assist to prevent LOB Lt and posteriorly if not in Rt forearm prop.  Postural control: Posterior lean;Left lateral lean Standing balance support: Bilateral upper extremity supported;During functional activity Standing balance-Leahy Scale: Zero              Pertinent Vitals/Pain Pain Assessment: No/denies pain    Home Living Family/patient expects to be discharged to:: Private residence Living Arrangements: Children Available Help at Discharge: Family Type of Home: House Home Access: Stairs to enter Entrance Stairs-Rails: Psychiatric nurse of Steps: 2 Home Layout: One level Home Equipment: None Additional Comments: pt lives with 35 y.o. son and has 2 adult daughters who are able to assist as well. Pt's daughter  [name] works weekends and is available during weekdays for support/assist.    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[employer], driving      [employer]   Dominant Hand: Right    Extremity/Trunk Assessment   Upper Extremity Assessment Upper Extremity Assessment: Defer to OT evaluation LUE Deficits / Details: flexion synergy patterns noted, inital tone in shoulder but fades with PROM, grossly 0/5 shoulder and elbow, 3-/5 hand ; unable to use functionally LUE Sensation: decreased light touch;decreased proprioception LUE Coordination: decreased fine motor;decreased gross motor    Lower Extremity Assessment Lower Extremity Assessment: RLE deficits/detail;LLE deficits/detail RLE Deficits / Details: Pt with gross weakness on Rt LE (4-/5 for knee flex/ext testing and ankle dorsi/plantar flexion.  LLE Deficits / Details: Pt with extensor tone in supine and resistance to knee flexion and hip abduction. Hamstring tone noted against PROM sitting EOB, decreased with slow ROM. Pt unable to perform AROM on Lt LE. Grosslly  0/5 throughout Lt LE.  LLE Sensation: decreased proprioception;decreased light touch LLE Coordination: decreased gross motor;decreased fine motor       Communication   Communication: Expressive difficulties  Cognition Arousal/Alertness: Lethargic (improves with engagement ) Behavior During Therapy: Flat affect (labile at times ) Overall Cognitive Status: Impaired/Different from baseline Area of Impairment: Attention;Memory;Safety/judgement;Following commands;Awareness;Problem solving      Current Attention Level: Sustained Memory: Decreased short-term memory;Decreased recall of precautions Following Commands: Follows one step commands with increased time;Follows one step commands consistently;Follows multi-step commands inconsistently Safety/Judgement: Decreased awareness of safety;Decreased awareness of deficits Awareness: Emergent Problem Solving: Slow processing;Decreased initiation;Difficulty sequencing;Requires verbal cues;Requires tactile cues General Comments: patient oriented, follows simple commands with  increased time but inconsistent with mulitple step commands, decreased awareness to deficits but becomes aware of inability to complete tasks with testing. Pt tearful at EOS when daughter walks in and states "I can't do anything for myself".      General Comments General comments (skin integrity, edema, etc.): daughter present at end of session, very supportive and reports family can provide 24/7 support/assist    Exercises     Assessment/Plan    PT Assessment Patient needs continued PT services  PT Problem List Decreased strength;Decreased range of motion;Decreased activity tolerance;Decreased balance;Decreased mobility;Decreased coordination;Decreased safety awareness;Decreased knowledge of use of DME;Decreased knowledge of precautions;Impaired tone       PT Treatment Interventions DME instruction;Functional mobility training;Gait training;Therapeutic activities;Balance training;Therapeutic exercise;Neuromuscular re-education;Patient/family education    PT Goals (Current goals can be found in the Care Plan section)  Acute Rehab PT Goals Patient Stated Goal: to get better  PT Goal Formulation: With patient Time For Goal Achievement: 05/12/20 Potential to Achieve Goals: Good    Frequency Min 4X/week   Barriers to discharge        Co-evaluation PT/OT/SLP Co-Evaluation/Treatment: Yes Reason for Co-Treatment: For patient/therapist safety;To address functional/ADL transfers PT goals addressed during session: Mobility/safety with mobility;Balance OT goals addressed during session: ADL's and self-care       AM-PAC PT "6 Clicks" Mobility  Outcome Measure Help needed turning from your back to your side while in a flat bed without using bedrails?: Total Help needed moving from lying on your back to sitting on the side of a flat bed without using bedrails?: Total Help needed moving to and from a bed to a chair (including a wheelchair)?: Total Help needed standing up from a chair  using your arms (e.g., wheelchair or bedside chair)?: Total  Help needed to walk in hospital room?: Total Help needed climbing 3-5 steps with a railing? : Total 6 Click Score: 6    End of Session Equipment Utilized During Treatment: Gait belt Activity Tolerance: Patient tolerated treatment well Patient left: in bed;with call bell/phone within reach;with bed alarm set;with family/visitor present Nurse Communication: Mobility status;Need for lift equipment (maximove for transfers) PT Visit Diagnosis: Other abnormalities of gait and mobility (R26.89);Muscle weakness (generalized) (M62.81);Difficulty in walking, not elsewhere classified (R26.2);Other symptoms and signs involving the nervous system (R29.898);Hemiplegia and hemiparesis Hemiplegia - Right/Left: Left Hemiplegia - dominant/non-dominant: Non-dominant Hemiplegia - caused by: Cerebral infarction    Time: 9872-1587 PT Time Calculation (min) (ACUTE ONLY): 39 min   Charges:   PT Evaluation $PT Eval Moderate Complexity: 1 Mod         Verner Mould, DPT Acute Rehabilitation Services  Office [phone] Pager [phone]  04/28/2020 12:27 PM

## 2020-04-28 NOTE — Evaluation (Signed)
Speech Language Pathology Evaluation Patient Details Name: [name] MRN: [id number] DOB: Jul 12, 1961 Today's Date: 04/28/2020 Time: 8546-2703 SLP Time Calculation (min) (ACUTE ONLY): 41 min  Problem List:  Patient Active Problem List   Diagnosis Date Noted  . Acute CVA (cerebrovascular accident) ([place]) 04/27/2020  . Accelerated hypertension 04/27/2020  . Renal dysfunction 04/27/2020  . Hypokalemia 04/27/2020   Past Medical History:  Past Medical History:  Diagnosis Date  . Arthritis   . Hypertension    not taking medications   Past Surgical History:  Past Surgical History:  Procedure Laterality Date  . CESAREAN SECTION     HPI:  pt is a 59 yo female adm to [facility] with left sided weakness and dysarthria.  Pt with PMH + for HTN, presenting with elevated BP and slurred speech.  She reports that on 8/16 (2 days PTA) she started feeling bad and having slurred speech per chart review.   MRI showed Band of restricted diffusion along the right corpus callosum body with minimal extension into the adjacent white matter and highright frontal cortex, ACA branch distribution.  Gradient was not performed but mayshow even more hypertensive type hemorrhages. Indistinct T2 hyperintensity in the pons and asymmetric T2 hyperintensity in the right middle cerebellar peduncle also attributed to chronic ischemia.   Speech eval ordered.  Pt resides with her son who she states is 15 and is in high school.  She has two grown daughters, one of whom lives in [place] and is a Chartered certified accountant.  Pt states daughter informed her to go to the hospital.   Assessment / Plan / Recommendation Clinical Impression  Patient was administered SLUMS exam and she scored 9/30 points indicative of severe cognitive linguistic deficits.  She also has mild dysarthria with decreased phonatory strength and imprecise articulation but is largely intelligible.  Pt reports difficulty with speech causing her distress and did not admit attention  difficulties until difficulties overtly apparent during exam.  Pt will benefit from skilled SLP to maximize her rehab potential.  SLP reviewed dysarthria strategies and attention tasks using teach back for reinforcement with max verbal cues.  SlP reviewed test results with pt and established goals with her assistance.    SLP Assessment  SLP Recommendation/Assessment: Patient needs continued Speech Lanaguage Pathology Services SLP Visit Diagnosis: Cognitive communication deficit (R41.841);Dysarthria and anarthria (R47.1)    Follow Up Recommendations  24 hour supervision/assistance    Frequency and Duration min 1 x/week  1 week      SLP Evaluation Cognition  Overall Cognitive Status: Within Functional Limits for tasks assessed Arousal/Alertness: Awake/alert Orientation Level: Oriented X4 Attention: Sustained Sustained Attention: Impaired Memory: Impaired Memory Impairment: Retrieval deficit (3/5 recalled without cues, 1/5 with category cue, 1/5 with multiple choice) Awareness: Appears intact (after difficulty performing test items, pt able to state difficulty with attention) Problem Solving: Impaired Problem Solving Impairment: Functional basic (clock drawing impaired numbering 5, 10, 15, etc..) Behaviors: Restless;Impulsive Safety/Judgment: Impaired Comments: pt able to verbalize she needs to have help at home       Comprehension  Auditory Comprehension Overall Auditory Comprehension: Impaired Yes/No Questions: Not tested Commands: Impaired Two Step Basic Commands: 75-100% accurate Multistep Basic Commands: 25-49% accurate Conversation: Complex Other Conversation Comments: pt's attention diffiuclties impair her ability to retain information Interfering Components: Attention;Processing speed;Working Curator: Not tested Reading Comprehension Reading Status: Not tested    Expression Expression Primary Mode of Expression:  Verbal Verbal Expression Overall Verbal Expression: Appears within functional limits for tasks  assessed Initiation: No impairment Level of Generative/Spontaneous Verbalization: Conversation;Sentence Repetition: Impaired (at complex level) Naming: Not tested Interfering Components: Attention;Speech intelligibility Non-Verbal Means of Communication: Not applicable Written Expression Dominant Hand: Right Written Expression:  (difficulty writing numbers clearly)   Oral / Motor  Oral Motor/Sensory Function Overall Oral Motor/Sensory Function: Within functional limits Motor Speech Overall Motor Speech: Appears within functional limits for tasks assessed Respiration: Within functional limits Resonance: Within functional limits Articulation: Impaired Level of Impairment: Sentence Intelligibility: Intelligible Motor Planning: Not tested Motor Speech Errors: Not applicable Effective Techniques: Slow rate   GO                    Macario Golds 04/28/2020, 9:14 AM  Kathleen Lime, MS [place] Office [phone]

## 2020-04-28 NOTE — Progress Notes (Signed)
Initial Nutrition Assessment  DOCUMENTATION CODES:   Not applicable  INTERVENTION:  Ensure Enlive po TID, each supplement provides 350 kcal and 20 grams of protein  MVI daily   NUTRITION DIAGNOSIS:   Inadequate oral intake related to poor appetite as evidenced by per patient/family report.   GOAL:   Patient will meet greater than or equal to 90% of their needs   MONITOR:   PO intake, Supplement acceptance, Weight trends, Labs, I & O's  REASON FOR ASSESSMENT:   Consult Other (Comment) (stroke)  ASSESSMENT:   Pt admitted with acute R ACA infarct. PMH includes HTN.   Pt reports poor appetite. PTA, pt ate 2 balanced meals per day (lunch and dinner). Pt states that she has never been much of a breakfast person. Pt states that she has lost weight over the last year due to medication changes impacting her taste buds, but reports this stabilized around March/April and pt's appetite returned to normal. Pt agreeable to use of oral nutrition supplements while admitted. Pt requests supplements be given at meal times.    Per Care Everywhere, pt weighed 185 lbs in September 2020. Pt now weighs 160 lbs. This indicates a 13.5% wt loss x 11 months, which is not significant for time frame.   No PO intake documented.   Labs and medications reviewed.   NUTRITION - FOCUSED PHYSICAL EXAM:  Deferred, will attempt at follow-up.  Diet Order:   Diet Order            Diet Heart Room service appropriate? Yes; Fluid consistency: Thin  Diet effective now                 EDUCATION NEEDS:   No education needs have been identified at this time  Skin:  Skin Assessment: Reviewed RN Assessment  Last BM:  8/17  Height:   Ht Readings from Last 1 Encounters:  04/26/20 5\' 4"  (1.626 m)    Weight:   Wt Readings from Last 1 Encounters:  04/26/20 72.6 kg    BMI:  Body mass index is 27.46 kg/m.  Estimated Nutritional Needs:   Kcal:  1800-2000  Protein:  90-100 grams  Fluid:   >/=1.8L/d    Larkin Ina, MS, RD, LDN RD pager number and weekend/on-call pager number located in [place].

## 2020-04-29 DIAGNOSIS — R4781 Slurred speech: Secondary | ICD-10-CM

## 2020-04-29 DIAGNOSIS — N1832 Chronic kidney disease, stage 3b: Secondary | ICD-10-CM

## 2020-04-29 DIAGNOSIS — R7303 Prediabetes: Secondary | ICD-10-CM

## 2020-04-29 DIAGNOSIS — I10 Essential (primary) hypertension: Secondary | ICD-10-CM

## 2020-04-29 DIAGNOSIS — D696 Thrombocytopenia, unspecified: Secondary | ICD-10-CM

## 2020-04-29 DIAGNOSIS — I5041 Acute combined systolic (congestive) and diastolic (congestive) heart failure: Secondary | ICD-10-CM | POA: Insufficient documentation

## 2020-04-29 DIAGNOSIS — I161 Hypertensive emergency: Secondary | ICD-10-CM

## 2020-04-29 DIAGNOSIS — I639 Cerebral infarction, unspecified: Secondary | ICD-10-CM

## 2020-04-29 LAB — BASIC METABOLIC PANEL
Anion gap: 8 (ref 5–15)
BUN: 18 mg/dL (ref 6–20)
CO2: 21 mmol/L — ABNORMAL LOW (ref 22–32)
Calcium: 9.1 mg/dL (ref 8.9–10.3)
Chloride: 111 mmol/L (ref 98–111)
Creatinine, Ser: 1.59 mg/dL — ABNORMAL HIGH (ref 0.44–1.00)
GFR calc Af Amer: 41 mL/min — ABNORMAL LOW (ref >60)
GFR calc non Af Amer: 35 mL/min — ABNORMAL LOW (ref >60)
Glucose, Bld: 90 mg/dL (ref 70–99)
Potassium: 3.6 mmol/L (ref 3.5–5.1)
Sodium: 140 mmol/L (ref 135–145)

## 2020-04-29 LAB — CBC
HCT: 33.7 % — ABNORMAL LOW (ref 36.0–46.0)
Hemoglobin: 10.7 g/dL — ABNORMAL LOW (ref 12.0–15.0)
MCH: 28.2 pg (ref 26.0–34.0)
MCHC: 31.8 g/dL (ref 30.0–36.0)
MCV: 88.9 fL (ref 80.0–100.0)
Platelets: 120 10*3/uL — ABNORMAL LOW (ref 150–400)
RBC: 3.79 MIL/uL — ABNORMAL LOW (ref 3.87–5.11)
RDW: 14.6 % (ref 11.5–15.5)
WBC: 9.4 10*3/uL (ref 4.0–10.5)
nRBC: 0 % (ref 0.0–0.2)

## 2020-04-29 MED ORDER — AMLODIPINE BESYLATE 5 MG PO TABS
5.0000 mg | ORAL_TABLET | Freq: Every day | ORAL | Status: DC
  Administered 2020-04-29: 5 mg via ORAL
  Filled 2020-04-29: qty 1

## 2020-04-29 MED ORDER — BOOST / RESOURCE BREEZE PO LIQD CUSTOM
1.0000 | Freq: Three times a day (TID) | ORAL | Status: DC
  Administered 2020-04-29 – 2020-05-01 (×5): 1 via ORAL

## 2020-04-29 MED ORDER — HYDRALAZINE HCL 10 MG PO TABS
10.0000 mg | ORAL_TABLET | Freq: Three times a day (TID) | ORAL | Status: DC
  Administered 2020-04-29 – 2020-04-30 (×3): 10 mg via ORAL
  Filled 2020-04-29 (×3): qty 1

## 2020-04-29 MED ORDER — AMLODIPINE BESYLATE 10 MG PO TABS
10.0000 mg | ORAL_TABLET | Freq: Every day | ORAL | Status: DC
  Administered 2020-04-30 – 2020-05-01 (×2): 10 mg via ORAL
  Filled 2020-04-29 (×2): qty 1

## 2020-04-29 NOTE — Progress Notes (Signed)
Physical Therapy Treatment Patient Details Name: [name] MRN: [id number] DOB: 25-Sep-1960 Today's Date: 04/29/2020    History of Present Illness Pt is a 59 y/o female with PMH of HTN, presenting with elevated BP, L hemiparesis, and slurred speech. MRI reveals acute Rt ACA CVA.     PT Comments    Pt has many well wishers as she previously worked at [employer], however obvious pt hs increased fatigue trying to carry conversation. Pt agreeable to getting to chair as she has not been out of bed. Pt has increased BP throughout session however is improved with sitting in chair (see General Comments). Continues to have double vision and decreased awareness of L side in addition to L sided weakness, although able to perform slight grip and wiggle toes. Pt is maxAx2 for bed mobility and total Ax2 for tranfers to recliner. Once in recliner pt found to be incontinent of stool and urine requiring assist x3 for cleaning. D/c plans remain appropriate at this time. PT will continue to follow acutely.     Follow Up Recommendations  CIR     Equipment Recommendations  Other (comment) (TBA)    Recommendations for Other Services Rehab consult     Precautions / Restrictions Precautions Precautions: Other (comment);Fall Precaution Comments: L hemi    Mobility  Bed Mobility Overal bed mobility: Needs Assistance Bed Mobility: Supine to Sit Rolling: Max assist;+2 for physical assistance   Supine to sit: Max assist;+2 for physical assistance;HOB elevated;+2 for safety/equipment     General bed mobility comments: pt able to reach across body with R UE to bed rail, needs reinforcement to leave it there and to pull herself up, initiates movement of R LE to EOB but ultimately requires maxAx2 for LE mangement off bed, trunk to upright and pad scoot to EoB. requires maxAx2 for rolling to perform pericare in recliner  Transfers Overall transfer level: Needs assistance Equipment used: 2 person hand held  assist Transfers: Squat Pivot Transfers     Squat pivot transfers: Total assist;+2 physical assistance     General transfer comment: total Ax2 for squat pivot tranfers to chair on R, on transfer pt found to have been incontinent of stool and urine      Modified Rankin (Stroke Patients Only) Modified Rankin (Stroke Patients Only) Pre-Morbid Rankin Score: No symptoms Modified Rankin: Severe disability     Balance Overall balance assessment: Needs assistance Sitting-balance support: No upper extremity supported;Feet supported Sitting balance-Leahy Scale: Zero Sitting balance - Comments: require total A for balance, increased L lateral lean, attempted to perform weightbearing through L UE but unable to manage trunk due to lean  Postural control: Posterior lean;Left lateral lean Standing balance support: Bilateral upper extremity supported;During functional activity Standing balance-Leahy Scale: Zero                              Cognition Arousal/Alertness: Lethargic (improves with engagement ) Behavior During Therapy: Flat affect Overall Cognitive Status: Impaired/Different from baseline Area of Impairment: Attention;Memory;Safety/judgement;Following commands;Awareness;Problem solving                   Current Attention Level: Sustained Memory: Decreased short-term memory;Decreased recall of precautions Following Commands: Follows one step commands with increased time;Follows one step commands consistently;Follows multi-step commands inconsistently Safety/Judgement: Decreased awareness of safety;Decreased awareness of deficits Awareness: Emergent Problem Solving: Slow processing;Decreased initiation;Difficulty sequencing;Requires verbal cues;Requires tactile cues General Comments: pt able to follow one step commands with  increased time and effort, continues to have poor understanding of extent of impairments          General Comments General comments (skin  integrity, edema, etc.): BP increased in supine 188/108, with sitting 202/101, with sitting up in recliner after pericare 191/109, with sitting in upright in chair 3 min 170/89      Pertinent Vitals/Pain  No reports of pain            PT Goals (current goals can now be found in the care plan section) Acute Rehab PT Goals Patient Stated Goal: to get better  PT Goal Formulation: With patient Time For Goal Achievement: 05/12/20 Potential to Achieve Goals: Good Progress towards PT goals: Progressing toward goals    Frequency    Min 4X/week      PT Plan Current plan remains appropriate    Co-evaluation PT/OT/SLP Co-Evaluation/Treatment: Yes            AM-PAC PT "6 Clicks" Mobility   Outcome Measure  Help needed turning from your back to your side while in a flat bed without using bedrails?: Total Help needed moving from lying on your back to sitting on the side of a flat bed without using bedrails?: Total Help needed moving to and from a bed to a chair (including a wheelchair)?: Total Help needed standing up from a chair using your arms (e.g., wheelchair or bedside chair)?: Total Help needed to walk in hospital room?: Total Help needed climbing 3-5 steps with a railing? : Total 6 Click Score: 6    End of Session Equipment Utilized During Treatment: Gait belt Activity Tolerance: Patient tolerated treatment well Patient left: in bed;with call bell/phone within reach;with chair alarm set Nurse Communication: Mobility status;Need for lift equipment (maximove for transfers) PT Visit Diagnosis: Other abnormalities of gait and mobility (R26.89);Muscle weakness (generalized) (M62.81);Difficulty in walking, not elsewhere classified (R26.2);Other symptoms and signs involving the nervous system (R29.898);Hemiplegia and hemiparesis Hemiplegia - Right/Left: Left Hemiplegia - dominant/non-dominant: Non-dominant Hemiplegia - caused by: Cerebral infarction     Time: 0964-3838 PT  Time Calculation (min) (ACUTE ONLY): 33 min  Charges:  $Therapeutic Activity: 23-37 mins                     Anaeli Cornwall B. Migdalia Dk PT, DPT Acute Rehabilitation Services Pager [phone] Office [phone]    [place] 04/29/2020, 4:47 PM

## 2020-04-29 NOTE — Progress Notes (Signed)
Brief Nutrition Note  RD consulted to change oral nutrition supplement because pt has not liked Ensure Enlive. Discussed with pt yesterday and plan was made to transition pt to [facility] if she did not do well with Ensure Enlive. Will d/c Ensure and order Boost Breeze po TID (each supplement provides 250 kcal and 9 grams of protein) in addition to Magic cup TID with meals (each supplement provides 290 kcal and 9 grams of protein).   Larkin Ina, MS, RD, LDN RD pager number and weekend/on-call pager number located in [place].

## 2020-04-29 NOTE — Progress Notes (Signed)
PROGRESS NOTE  [name] IRW:[id number] DOB: 1960/11/18 DOA: 04/26/2020 PCP: Patient, No Pcp Per   LOS: 2 days   Brief Narrative / Interim history: 59 year old female with history of HTN, presents to the hospital with elevated blood pressure and slurred speech.  She reported generalized malaise for couple of days prior to admission.  The night prior to admission has noticed slurring of her speech, checked her blood pressure at home and it was found to be elevated.  She does not take any blood pressure pills at home.  She has a degree of chronic blurred vision this is unchanged.  MRI of the brain showed acute right ACA branch infarct and severe chronic small vessel ischemia.  Subjective / 24h Interval events: -No complaints this morning, no chest pain, no shortness of breath.  Assessment & Plan: Principal Problem Acute CVA-MRI of the brain showed acute right ACA branch infarct.  Neurology consulted and following.  She has been placed on aspirin.  Underwent a 2D echocardiogram which showed an EF of 76-19%, grade 2 diastolic dysfunction. Transcranial Doppler showed no evidence of right to left intracardiac communication.  Underwent a CT angiogram as below  1. Severe right A2 segment narrowing correlating with the acute infarct by the preceding MRI. 2. 55% left paraclinoid ICA stenosis. 50% right cavernous ICA stenosis 3. Diffuse atheromatous change to medium size vessels. Notable bilateral P2 segment stenoses. 4. Mild atherosclerosis in the neck without stenosis or visible embolic source.  -Lipid panel shows an LDL of 82, she was started on a statin -Hemoglobin A1c shows prediabetes at 5.9. -Therapy recommending CIR, admission process has been initiated, will likely be completed early next week  Active Problems Hypertensive emergency-persistently elevated blood pressure into the 230s over 130s, add labetalol as needed to maintain systolic less than 509.  Gradually normalize blood pressure  in 5 days.  She is not on any antihypertensives at home, she is still persistently high, will add low-dose amlodipine today and will increase over the weekend as indicated  Elevated creatinine, suspect chronic kidney disease stage IIIb-normal renal function 2017 but no recent visits, does not have a PCP.  Suspect creatinine is chronically elevated due to uncontrolled and untreated hypertension.  Received IV fluids overnight for a creatinine of 1.8 on admission and improved to some extent to 1.53.  Creatinine has remained stable to 1.5 which I suspect this is her baseline   Scheduled Meds: . amLODipine  5 mg Oral Daily  . aspirin  325 mg Oral Daily   Or  . aspirin  300 mg Rectal Daily  . atorvastatin  40 mg Oral Daily  . clopidogrel  75 mg Oral Daily  . enoxaparin (LOVENOX) injection  40 mg Subcutaneous Q24H  . feeding supplement (ENSURE ENLIVE)  237 mL Oral TID WC  . multivitamin with minerals  1 tablet Oral Daily   Continuous Infusions: . 0.9 % NaCl with KCl 20 mEq / L 100 mL/hr at 04/29/20 0610   PRN Meds:.acetaminophen **OR** acetaminophen (TYLENOL) oral liquid 160 mg/5 mL **OR** acetaminophen, labetalol, senna-docusate  Diet Orders (From admission, onward)    Start     Ordered   04/27/20 1631  Diet Heart Room service appropriate? Yes; Fluid consistency: Thin  Diet effective now       Question Answer Comment  Room service appropriate? Yes   Fluid consistency: Thin      04/27/20 1630          DVT prophylaxis: enoxaparin (LOVENOX) injection 40 mg  Start: 04/27/20 0845    Code Status: DNR  Family Communication: no family at bedside   Status is: Inpatient  Remains inpatient appropriate because:Inpatient level of care appropriate due to severity of illness   Dispo: The patient is from: Home              Anticipated d/c is to: CIR              Anticipated d/c date is: 2 days              Patient currently is not medically stable to d/c.  Consultants:  Neurology    Procedures:  2d echo  Microbiology  None   Antimicrobials: None    Objective: Vitals:   04/29/20 0400 04/29/20 0600 04/29/20 0830 04/29/20 1016  BP: (!) 215/102 (!) 216/117 (!) 226/118 (!) 195/103  Pulse: 76 75 79 77  Resp: 17 18 19 18   Temp: 98.1 F (36.7 C) 98.2 F (36.8 C) 98.5 F (36.9 C) 98.8 F (37.1 C)  TempSrc:   Oral Oral  SpO2: 100% 100% 100%   Weight:      Height:        Intake/Output Summary (Last 24 hours) at 04/29/2020 1041 Last data filed at 04/28/2020 2345 Gross per 24 hour  Intake --  Output 1800 ml  Net -1800 ml   Filed Weights   04/26/20 2127  Weight: 72.6 kg    Examination:  Constitutional: No distress, in bed Eyes: No scleral icterus ENMT: Moist mucous membranes Neck: normal, supple Respiratory: Clear bilaterally without wheezing or crackles Cardiovascular: Regular rate and rhythm, no murmurs heard.  No edema Abdomen: Soft, nontender, nondistended, bowel sounds positive Musculoskeletal: no clubbing / cyanosis.  Skin: No rashes seen Neurologic: No new focal deficits  Data Reviewed: I have independently reviewed following labs and imaging studies   CBC: Recent Labs  Lab 04/26/20 2135 04/27/20 0237 04/27/20 0246 04/29/20 0207  WBC 9.9  --   --  9.4  NEUTROABS  --  7.2  --   --   HGB 12.5  --  13.3 10.7*  HCT 39.0  --  39.0 33.7*  MCV 89.0  --   --  88.9  PLT 143*  --   --  528*   Basic Metabolic Panel: Recent Labs  Lab 04/26/20 2135 04/27/20 0246 04/28/20 0936 04/29/20 0207  NA 141 142 140 140  K 2.9* 3.1* 3.6 3.6  CL 105 103 109 111  CO2 24  --  21* 21*  GLUCOSE 119* 113* 94 90  BUN 26* 28* 16 18  CREATININE 1.81* 1.80* 1.53* 1.59*  CALCIUM 9.6  --  9.3 9.1   Liver Function Tests: No results for input(s): AST, ALT, ALKPHOS, BILITOT, PROT, ALBUMIN in the last 168 hours. Coagulation Profile: Recent Labs  Lab 04/27/20 0237  INR 1.1   HbA1C: Recent Labs    04/28/20 0221  HGBA1C 5.9*   CBG: No results  for input(s): GLUCAP in the last 168 hours.  Recent Results (from the past 240 hour(s))  SARS Coronavirus 2 by RT PCR (hospital order, performed in [facility] hospital lab) Nasopharyngeal Nasopharyngeal Swab     Status: None   Collection Time: 04/27/20  3:51 AM   Specimen: Nasopharyngeal Swab  Result Value Ref Range Status   SARS Coronavirus 2 NEGATIVE NEGATIVE Final    Comment: (NOTE) SARS-CoV-2 target nucleic acids are NOT DETECTED.  The SARS-CoV-2 RNA is generally detectable in upper and lower respiratory specimens  during the acute phase of infection. The lowest concentration of SARS-CoV-2 viral copies this assay can detect is 250 copies / mL. A negative result does not preclude SARS-CoV-2 infection and should not be used as the sole basis for treatment or other patient management decisions.  A negative result may occur with improper specimen collection / handling, submission of specimen other than nasopharyngeal swab, presence of viral mutation(s) within the areas targeted by this assay, and inadequate number of viral copies (<250 copies / mL). A negative result must be combined with clinical observations, patient history, and epidemiological information.  Fact Sheet for Patients:   StrictlyIdeas.no  Fact Sheet for Healthcare Providers: BankingDealers.co.za  This test is not yet approved or  cleared by the [place] FDA and has been authorized for detection and/or diagnosis of SARS-CoV-2 by FDA under an Emergency Use Authorization (EUA).  This EUA will remain in effect (meaning this test can be used) for the duration of the COVID-19 declaration under Section 564(b)(1) of the Act, 21 U.S.C. section 360bbb-3(b)(1), unless the authorization is terminated or revoked sooner.  Performed at [facility], [place] [address], [place], [place] [address]      Radiology Studies: No results found. Marzetta Board, MD, PhD [facility]  Between 7 am - 7 pm I am available, please contact me via Amion or Securechat  Between 7 pm - 7 am I am not available, please contact night coverage MD/APP via Amion

## 2020-04-29 NOTE — Progress Notes (Signed)
Inpatient Rehabilitation Admissions Coordinator  I have begun insurance authorization with [facility] for a possible Cir admit and will follow up on Monday for discussion with family of caregiver support and possible admit,  Danne Baxter, RN, MSN Rehab Admissions Coordinator [phone] 04/29/2020 6:16 PM

## 2020-04-30 LAB — BASIC METABOLIC PANEL
Anion gap: 7 (ref 5–15)
BUN: 18 mg/dL (ref 6–20)
CO2: 25 mmol/L (ref 22–32)
Calcium: 9.2 mg/dL (ref 8.9–10.3)
Chloride: 109 mmol/L (ref 98–111)
Creatinine, Ser: 1.55 mg/dL — ABNORMAL HIGH (ref 0.44–1.00)
GFR calc Af Amer: 42 mL/min — ABNORMAL LOW (ref >60)
GFR calc non Af Amer: 37 mL/min — ABNORMAL LOW (ref >60)
Glucose, Bld: 111 mg/dL — ABNORMAL HIGH (ref 70–99)
Potassium: 3.9 mmol/L (ref 3.5–5.1)
Sodium: 141 mmol/L (ref 135–145)

## 2020-04-30 MED ORDER — HYDRALAZINE HCL 25 MG PO TABS
25.0000 mg | ORAL_TABLET | Freq: Three times a day (TID) | ORAL | Status: DC
  Administered 2020-04-30: 25 mg via ORAL
  Filled 2020-04-30: qty 1

## 2020-04-30 MED ORDER — HYDRALAZINE HCL 50 MG PO TABS
50.0000 mg | ORAL_TABLET | Freq: Three times a day (TID) | ORAL | Status: DC
  Administered 2020-04-30 – 2020-05-01 (×2): 50 mg via ORAL
  Filled 2020-04-30 (×2): qty 1

## 2020-04-30 NOTE — Progress Notes (Signed)
PROGRESS NOTE  [name] OXB:[id number] DOB: [birth year] DOA: 04/26/2020 PCP: Patient, No Pcp Per   LOS: 3 days   Brief Narrative / Interim history: 59 year old female with history of HTN, presents to the hospital with elevated blood pressure and slurred speech.  She reported generalized malaise for couple of days prior to admission.  The night prior to admission has noticed slurring of her speech, checked her blood pressure at home and it was found to be elevated.  She does not take any blood pressure pills at home.  She has a degree of chronic blurred vision this is unchanged.  MRI of the brain showed acute right ACA branch infarct and severe chronic small vessel ischemia.  Subjective / 24h Interval events: -No complaints, no chest pain, no shortness of breath.  Assessment & Plan: Principal Problem Acute CVA-MRI of the brain showed acute right ACA branch infarct.  Neurology consulted and following.  She has been placed on aspirin.  Underwent a 2D echocardiogram which showed an EF of 68-34%, grade 2 diastolic dysfunction. Transcranial Doppler showed no evidence of right to left intracardiac communication.  Underwent a CT angiogram as below  1. Severe right A2 segment narrowing correlating with the acute infarct by the preceding MRI. 2. 55% left paraclinoid ICA stenosis. 50% right cavernous ICA stenosis 3. Diffuse atheromatous change to medium size vessels. Notable bilateral P2 segment stenoses. 4. Mild atherosclerosis in the neck without stenosis or visible embolic source.  -Lipid panel shows an LDL of 82, she was started on a statin -Hemoglobin A1c shows prediabetes at 5.9. -CIR admission pending  Active Problems Hypertensive emergency-persistently elevated blood pressure into the 230s over 130s, continue labetalol IV PRN.  I have added amlodipine as well as hydralazine, increase hydralazine dose today even more.  Continue to monitor, she still remains persistently  hypertensive  Elevated creatinine, suspect chronic kidney disease stage IIIb-normal renal function 2017 but no recent visits, does not have a PCP.  Suspect creatinine is chronically elevated due to uncontrolled and untreated hypertension.  Received IV fluids overnight for a creatinine of 1.8 on admission and improved to some extent to 1.53.  Creatinine has remained stable confirming CKD   Scheduled Meds:  amLODipine  10 mg Oral Daily   aspirin  325 mg Oral Daily   Or   aspirin  300 mg Rectal Daily   atorvastatin  40 mg Oral Daily   clopidogrel  75 mg Oral Daily   enoxaparin (LOVENOX) injection  40 mg Subcutaneous Q24H   feeding supplement  1 Container Oral TID BM   hydrALAZINE  25 mg Oral Q8H   multivitamin with minerals  1 tablet Oral Daily   Continuous Infusions:  PRN Meds:.acetaminophen **OR** acetaminophen (TYLENOL) oral liquid 160 mg/5 mL **OR** acetaminophen, labetalol, senna-docusate  Diet Orders (From admission, onward)    Start     Ordered   04/27/20 1631  Diet Heart Room service appropriate? Yes; Fluid consistency: Thin  Diet effective now       Question Answer Comment  Room service appropriate? Yes   Fluid consistency: Thin      04/27/20 1630          DVT prophylaxis: enoxaparin (LOVENOX) injection 40 mg Start: 04/27/20 0845    Code Status: DNR  Family Communication: no family at bedside   Status is: Inpatient  Remains inpatient appropriate because:Inpatient level of care appropriate due to severity of illness   Dispo: The patient is from: Home  Anticipated d/c is to: CIR              Anticipated d/c date is: 2 days              Patient currently is not medically stable to d/c.  Consultants:  Neurology   Procedures:  2d echo  Microbiology  None   Antimicrobials: None    Objective: Vitals:   04/30/20 0140 04/30/20 0140 04/30/20 0441 04/30/20 0901  BP: (!) 175/120 (!) 175/120 (!) 222/126 (!) 224/127  Pulse: (!) 59 (!) 59  92 91  Resp: 20 20 20 20   Temp: 98.6 F (37 C) 98.6 F (37 C) 98.3 F (36.8 C) (!) 97.4 F (36.3 C)  TempSrc: Oral  Oral Oral  SpO2: 100% 100% 100% 99%  Weight:      Height:        Intake/Output Summary (Last 24 hours) at 04/30/2020 1144 Last data filed at 04/30/2020 8416 Gross per 24 hour  Intake --  Output 1350 ml  Net -1350 ml   Filed Weights   04/26/20 2127  Weight: 72.6 kg    Examination:  Constitutional: NAD, in chair Eyes: No scleral icterus ENMT: Moist mucous membranes Neck: normal, supple Respiratory: Clear to auscultation bilaterally, no wheezing, no crackles Cardiovascular: Regular rate and rhythm, no murmurs appreciated.  No peripheral edema Abdomen: Soft, nontender, nondistended, bowel sounds positive Musculoskeletal: no clubbing / cyanosis.  Skin: No rashes seen Neurologic: No new focal deficits  Data Reviewed: I have independently reviewed following labs and imaging studies   CBC: Recent Labs  Lab 04/26/20 2135 04/27/20 0237 04/27/20 0246 04/29/20 0207  WBC 9.9  --   --  9.4  NEUTROABS  --  7.2  --   --   HGB 12.5  --  13.3 10.7*  HCT 39.0  --  39.0 33.7*  MCV 89.0  --   --  88.9  PLT 143*  --   --  606*   Basic Metabolic Panel: Recent Labs  Lab 04/26/20 2135 04/27/20 0246 04/28/20 0936 04/29/20 0207 04/30/20 0256  NA 141 142 140 140 141  K 2.9* 3.1* 3.6 3.6 3.9  CL 105 103 109 111 109  CO2 24  --  21* 21* 25  GLUCOSE 119* 113* 94 90 111*  BUN 26* 28* 16 18 18   CREATININE 1.81* 1.80* 1.53* 1.59* 1.55*  CALCIUM 9.6  --  9.3 9.1 9.2   Liver Function Tests: No results for input(s): AST, ALT, ALKPHOS, BILITOT, PROT, ALBUMIN in the last 168 hours. Coagulation Profile: Recent Labs  Lab 04/27/20 0237  INR 1.1   HbA1C: Recent Labs    04/28/20 0221  HGBA1C 5.9*   CBG: No results for input(s): GLUCAP in the last 168 hours.  Recent Results (from the past 240 hour(s))  SARS Coronavirus 2 by RT PCR (hospital order, performed in  [facility] hospital lab) Nasopharyngeal Nasopharyngeal Swab     Status: None   Collection Time: 04/27/20  3:51 AM   Specimen: Nasopharyngeal Swab  Result Value Ref Range Status   SARS Coronavirus 2 NEGATIVE NEGATIVE Final    Comment: (NOTE) SARS-CoV-2 target nucleic acids are NOT DETECTED.  The SARS-CoV-2 RNA is generally detectable in upper and lower respiratory specimens during the acute phase of infection. The lowest concentration of SARS-CoV-2 viral copies this assay can detect is 250 copies / mL. A negative result does not preclude SARS-CoV-2 infection and should not be used as the sole basis for treatment  or other patient management decisions.  A negative result may occur with improper specimen collection / handling, submission of specimen other than nasopharyngeal swab, presence of viral mutation(s) within the areas targeted by this assay, and inadequate number of viral copies (<250 copies / mL). A negative result must be combined with clinical observations, patient history, and epidemiological information.  Fact Sheet for Patients:   StrictlyIdeas.no  Fact Sheet for Healthcare Providers: BankingDealers.co.za  This test is not yet approved or  cleared by the [place] FDA and has been authorized for detection and/or diagnosis of SARS-CoV-2 by FDA under an Emergency Use Authorization (EUA).  This EUA will remain in effect (meaning this test can be used) for the duration of the COVID-19 declaration under Section 564(b)(1) of the Act, 21 U.S.C. section 360bbb-3(b)(1), unless the authorization is terminated or revoked sooner.  Performed at [facility], [place] [address], [place], [place] [address]      Radiology Studies: No results found. Marzetta Board, MD, PhD [facility]  Between 7 am - 7 pm I am available, please contact me via Amion or Securechat  Between 7 pm - 7 am I am not available, please contact  night coverage MD/APP via Amion

## 2020-05-01 ENCOUNTER — Encounter (HOSPITAL_COMMUNITY): Payer: Self-pay | Admitting: Internal Medicine

## 2020-05-01 MED ORDER — HYDRALAZINE HCL 50 MG PO TABS
100.0000 mg | ORAL_TABLET | Freq: Three times a day (TID) | ORAL | Status: DC
  Administered 2020-05-01 – 2020-05-02 (×3): 100 mg via ORAL
  Filled 2020-05-01 (×5): qty 2

## 2020-05-01 NOTE — Progress Notes (Signed)
PROGRESS NOTE    [name]  RFF:[id number] DOB: 1961-04-26 DOA: 04/26/2020 PCP: Patient, No Pcp Per   Brief Narrative: [name] is a 59 y.o. female with history of HTN, presents to the hospital with elevated blood pressure and slurred speech and found to have an acute stroke.   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 Neurology consulted. Transthoracic Echocardiogram significant for no thombus and transcranial doppler significant for negative study. CTA head/neck significant for severe right A1 narrowing. LDL of 82 and hemoglobin A1C of 5.9%. Neurology recommending DAPT x 3 weeks followed by aspirin 81 mg monotherapy. -Continue Aspirin 81 mg and Plavix 75 mg daily -PT/OT recommending CIR  Hypertensive emergency Essential hypertension Patient is not on medication as an outpatient. Patient's blood pressure was liberally controlled in the setting of acute  CVA and is now nearing the end of the window for loose control -Continue Amlodipine 10 mg daily -Increase to Hydralazine 100 mg TID -Continue Labetalol prn  AKI on CKD stage IIIb Likely diagnosis. Creatinine of 1.8 on admission which improved to 1.55 with IV fluids. Stable.  Thrombocytopenia Unknown chronicity -CBC in AM  Anemia Normocytic. Unknown chronicity -AM iron/ferritin/TIBC -CBC in AM   DVT prophylaxis: Lovenox Code Status:   Code Status: DNR Family Communication: None at bedside Disposition Plan: Discharge to CIR when bed available   Consultants:   Neurology  Procedures:   TRANSTHORACIC  ECHOCARDIOGRAM (04/27/2020) IMPRESSIONS    1. Left ventricular ejection fraction, by estimation, is 60 to 65%. The  left ventricle has normal function. The left ventricle has no regional  wall motion abnormalities. There is mild left ventricular hypertrophy.  Left ventricular diastolic parameters  are consistent with Grade II diastolic dysfunction (pseudonormalization).  Elevated left ventricular end-diastolic pressure.  2. Right ventricular systolic function is normal. The right ventricular  size is normal.  3. The mitral valve is normal in structure. Trivial mitral valve  regurgitation. No evidence of mitral stenosis.  4. The aortic valve is normal in structure. Aortic valve regurgitation is  not visualized. Mild aortic valve sclerosis is present, with no evidence  of aortic valve stenosis.  5. The inferior vena cava is normal in size with greater than 50%  respiratory variability, suggesting right atrial pressure of 3 mmHg.  Antimicrobials:  None    Subjective: No issues overnight  Objective: Vitals:   04/30/20 2347 05/01/20 0406 05/01/20 0816 05/01/20 1237  BP: (!) 194/99 (!) 197/95 (!) 201/90 (!) 181/98  Pulse: (!) 105 (!) 106 (!) 105 99  Resp: 18 18 18 16   Temp: 97.6 F (36.4 C) 98.2 F (36.8 C) 98.6 F (37 C) 99.5 F (37.5 C)  TempSrc: Oral Oral Oral   SpO2: 93% 100% 100% 100%  Weight:      Height:        Intake/Output Summary (Last 24 hours) at 05/01/2020 1513 Last data filed at 05/01/2020 0500 Gross per 24 hour  Intake --  Output 1330 ml  Net -1330 ml   Filed Weights   04/26/20 2127  Weight: 72.6 kg    Examination:  General exam: Appears calm and comfortable    Data Reviewed: I have personally reviewed following labs and imaging studies  CBC Lab Results  Component Value Date   WBC 9.4 04/29/2020   RBC 3.79 (L) 04/29/2020   HGB 10.7 (L) 04/29/2020   HCT 33.7 (L) 04/29/2020   MCV 88.9 04/29/2020   MCH 28.2 04/29/2020   PLT 120 (L)  04/29/2020   MCHC 31.8 04/29/2020   RDW 14.6 04/29/2020   LYMPHSABS 1.6 04/27/2020   MONOABS 0.8 04/27/2020   EOSABS 0.1 04/27/2020   BASOSABS 0.1 27/78/2423     Last metabolic panel Lab Results  Component Value Date   NA 141 04/30/2020   K 3.9 04/30/2020   CL 109 04/30/2020   CO2 25 04/30/2020   BUN 18 04/30/2020   CREATININE 1.55 (H) 04/30/2020   GLUCOSE 111 (H) 04/30/2020   GFRNONAA 37 (L) 04/30/2020   GFRAA 42 (L) 04/30/2020   CALCIUM 9.2 04/30/2020   ANIONGAP 7 04/30/2020    CBG (last 3)  No results for input(s): GLUCAP in the last 72 hours.   GFR: Estimated Creatinine Clearance: 38.7 mL/min (A) (by C-G formula based on SCr of 1.55 mg/dL (H)).  Coagulation Profile: Recent Labs  Lab 04/27/20 0237  INR 1.1    Recent Results (from the past 240 hour(s))  SARS Coronavirus 2 by RT PCR (hospital order, performed in [facility] hospital lab) Nasopharyngeal Nasopharyngeal Swab     Status: None   Collection Time: 04/27/20  3:51 AM   Specimen: Nasopharyngeal Swab  Result Value Ref Range Status   SARS Coronavirus 2 NEGATIVE NEGATIVE Final    Comment: (NOTE) SARS-CoV-2 target nucleic acids are NOT DETECTED.  The SARS-CoV-2 RNA is generally detectable in upper and lower respiratory specimens during the acute phase of infection. The lowest concentration of SARS-CoV-2 viral copies this assay can detect is 250 copies / mL. A negative result does not preclude SARS-CoV-2 infection and should not be used as the sole basis for treatment or other patient management decisions.  A negative result may occur with improper specimen collection / handling, submission of specimen other than nasopharyngeal swab, presence of viral mutation(s) within the areas targeted by this assay, and inadequate number of viral copies (<250 copies / mL). A negative result must be combined with clinical observations, patient history, and epidemiological information.  Fact Sheet for Patients:     StrictlyIdeas.no  Fact Sheet for Healthcare Providers: BankingDealers.co.za  This test is not yet approved or  cleared by the [place] FDA and has been authorized for detection and/or diagnosis of SARS-CoV-2 by FDA under an Emergency Use Authorization (EUA).  This EUA will remain in effect (meaning this test can be used) for the duration of the COVID-19 declaration under Section 564(b)(1) of the Act, 21 U.S.C. section 360bbb-3(b)(1), unless the authorization is terminated or revoked sooner.  Performed at [facility], [place] [address], [place], [place] [address]         Radiology Studies: No results found.      Scheduled Meds: . amLODipine  10 mg Oral Daily  . aspirin  325 mg Oral Daily   Or  . aspirin  300 mg Rectal Daily  . atorvastatin  40 mg Oral Daily  . clopidogrel  75 mg Oral Daily  . enoxaparin (LOVENOX) injection  40 mg Subcutaneous Q24H  . feeding supplement  1 Container Oral TID BM  . hydrALAZINE  100 mg Oral Q8H  . multivitamin with  minerals  1 tablet Oral Daily   Continuous Infusions:   LOS: 4 days     Cordelia Poche, MD [facility] 05/01/2020, 3:13 PM  If 7PM-7AM, please contact night-coverage [email or web address]

## 2020-05-02 ENCOUNTER — Inpatient Hospital Stay (HOSPITAL_COMMUNITY): Payer: 59

## 2020-05-02 DIAGNOSIS — I5042 Chronic combined systolic (congestive) and diastolic (congestive) heart failure: Secondary | ICD-10-CM

## 2020-05-02 LAB — URINALYSIS, ROUTINE W REFLEX MICROSCOPIC
Bilirubin Urine: NEGATIVE
Glucose, UA: NEGATIVE mg/dL
Hgb urine dipstick: NEGATIVE
Ketones, ur: NEGATIVE mg/dL
Leukocytes,Ua: NEGATIVE
Nitrite: NEGATIVE
Protein, ur: NEGATIVE mg/dL
Specific Gravity, Urine: 1.012 (ref 1.005–1.030)
pH: 5 (ref 5.0–8.0)

## 2020-05-02 LAB — CBC
HCT: 35.1 % — ABNORMAL LOW (ref 36.0–46.0)
Hemoglobin: 11.4 g/dL — ABNORMAL LOW (ref 12.0–15.0)
MCH: 28.1 pg (ref 26.0–34.0)
MCHC: 32.5 g/dL (ref 30.0–36.0)
MCV: 86.5 fL (ref 80.0–100.0)
Platelets: 168 10*3/uL (ref 150–400)
RBC: 4.06 MIL/uL (ref 3.87–5.11)
RDW: 14.6 % (ref 11.5–15.5)
WBC: 15.2 10*3/uL — ABNORMAL HIGH (ref 4.0–10.5)
nRBC: 0 % (ref 0.0–0.2)

## 2020-05-02 LAB — IRON AND TIBC
Iron: 18 ug/dL — ABNORMAL LOW (ref 28–170)
Saturation Ratios: 7 % — ABNORMAL LOW (ref 10.4–31.8)
TIBC: 267 ug/dL (ref 250–450)
UIBC: 249 ug/dL

## 2020-05-02 LAB — FERRITIN: Ferritin: 68 ng/mL (ref 11–307)

## 2020-05-02 LAB — GLUCOSE, CAPILLARY: Glucose-Capillary: 135 mg/dL — ABNORMAL HIGH (ref 70–99)

## 2020-05-02 MED ORDER — FERROUS SULFATE 325 (65 FE) MG PO TABS
325.0000 mg | ORAL_TABLET | Freq: Two times a day (BID) | ORAL | Status: DC
  Administered 2020-05-02 – 2020-05-03 (×3): 325 mg via ORAL
  Filled 2020-05-02 (×3): qty 1

## 2020-05-02 MED ORDER — HYDRALAZINE HCL 50 MG PO TABS
75.0000 mg | ORAL_TABLET | Freq: Three times a day (TID) | ORAL | Status: DC
  Administered 2020-05-02 – 2020-05-03 (×4): 75 mg via ORAL
  Filled 2020-05-02 (×4): qty 1

## 2020-05-02 MED ORDER — AMLODIPINE BESYLATE 10 MG PO TABS
10.0000 mg | ORAL_TABLET | Freq: Every day | ORAL | Status: DC
  Administered 2020-05-03: 10 mg via ORAL
  Filled 2020-05-02: qty 1

## 2020-05-02 NOTE — Progress Notes (Signed)
Occupational Therapy Treatment Patient Details Name: [name] MRN: [id number] DOB: June 29, 1961 Today's Date: 05/02/2020    History of present illness Pt is a 59 y/o female with PMH of HTN, presenting with elevated BP, L hemiparesis, and slurred speech. MRI reveals acute Rt ACA CVA.    OT comments  Patient supine in bed and agreeable to OT/PT session.  Patient continues to be limited by decreased activity tolerance, L sided hemiparesis, impaired balance, impaired vision with difficulty maintaining eyes open, L inattention, and impaired cognition. She requires max assist +2 for bed mobility, total assist for sitting balance at EOB with posterior and L lateral lean.  Stand pivot to recliner today with max assist +2.  L UE PROM/gentle stretch in once seated in recliner. Encouraged daughter to engage with patient on L side.  Will follow acutely, CIR remains appropriate.    Follow Up Recommendations  CIR;Supervision/Assistance - 24 hour    Equipment Recommendations  Other (comment) (TBD at next venue of care )    Recommendations for Other Services Rehab consult    Precautions / Restrictions Precautions Precautions: Other (comment);Fall Precaution Comments: L hemi, notable tendency to push with RUE to weaker L side Restrictions Weight Bearing Restrictions: No       Mobility Bed Mobility Overal bed mobility: Needs Assistance Bed Mobility: Rolling;Supine to Sit Rolling: Max assist;+2 for physical assistance   Supine to sit: Max assist;+2 for physical assistance;HOB elevated;+2 for safety/equipment     General bed mobility comments: pt able to reach across body with R UE to bed rail, needs reinforcement to leave it there and to pull herself up, initiates movement of R LE to EOB but ultimately requires maxAx2 for LE mangement off bed, trunk to upright and pad scoot to EoB  Transfers Overall transfer level: Needs assistance Equipment used: 2 person hand held assist Transfers: Sit  to/from [facility] Sit to Stand: Max assist;+2 physical assistance Stand pivot transfers: Max assist;+2 physical assistance       General transfer comment: max assist to power up and steady from elevated EOB, requires support for anterior translation as well; pivoting to recliner and unable to progress L LE     Balance Overall balance assessment: Needs assistance Sitting-balance support: No upper extremity supported;Feet supported Sitting balance-Leahy Scale: Zero Sitting balance - Comments: continues to requires total assist for sitting balance, improved with leaning on R elbow but unable to sustain  Postural control: Posterior lean;Left lateral lean Standing balance support: Bilateral upper extremity supported;During functional activity Standing balance-Leahy Scale: Zero                             ADL either performed or assessed with clinical judgement   ADL Overall ADL's : Needs assistance/impaired                     Lower Body Dressing: Total assistance;+2 for physical assistance;+2 for safety/equipment;Sit to/from stand Lower Body Dressing Details (indicate cue type and reason): total assist for socks, requires +2 sit to stand  Toilet Transfer: Total assistance;+2 for physical assistance;+2 for safety/equipment;Stand-pivot Toilet Transfer Details (indicate cue type and reason): simulated to recliner          Functional mobility during ADLs: Maximal assistance;Total assistance;+2 for physical assistance;+2 for safety/equipment       Vision   Additional Comments: pt continues to deny diplopia, open eyes initally but fatigue and difficulty maintaining eyes open during session;  requires cueing to scan towards L side    Perception     Praxis      Cognition Arousal/Alertness: Awake/alert;Lethargic Behavior During Therapy: Flat affect Overall Cognitive Status: Impaired/Different from baseline Area of Impairment:  Attention;Memory;Safety/judgement;Following commands;Awareness;Problem solving                   Current Attention Level: Sustained Memory: Decreased recall of precautions;Decreased short-term memory Following Commands: Follows one step commands with increased time;Follows multi-step commands inconsistently Safety/Judgement: Decreased awareness of safety;Decreased awareness of deficits Awareness: Emergent Problem Solving: Slow processing;Decreased initiation;Difficulty sequencing;Requires verbal cues;Requires tactile cues General Comments: patient awake initally but becomes lethargic and has difficulty maintaining eyes open during session; requires increased time to follow 1 step commands         Exercises     Shoulder Instructions       General Comments PROM and gentle stretch provided from shoulder to hand on L UE     Pertinent Vitals/ Pain       Pain Assessment: No/denies pain  Home Living                                          Prior Functioning/Environment              Frequency  Min 2X/week        Progress Toward Goals  OT Goals(current goals can now be found in the care plan section)  Progress towards OT goals: Progressing toward goals  Acute Rehab OT Goals Patient Stated Goal: to get better  OT Goal Formulation: With patient Time For Goal Achievement: 05/12/20 Potential to Achieve Goals: Good  Plan Discharge plan remains appropriate;Frequency remains appropriate    Co-evaluation    PT/OT/SLP Co-Evaluation/Treatment: Yes Reason for Co-Treatment: Complexity of the patient's impairments (multi-system involvement);Necessary to address cognition/behavior during functional activity;For patient/therapist safety;To address functional/ADL transfers PT goals addressed during session: Mobility/safety with mobility OT goals addressed during session: ADL's and self-care      AM-PAC OT "6 Clicks" Daily Activity     Outcome Measure    Help from another person eating meals?: A Lot Help from another person taking care of personal grooming?: A Lot Help from another person toileting, which includes using toliet, bedpan, or urinal?: Total Help from another person bathing (including washing, rinsing, drying)?: A Lot Help from another person to put on and taking off regular upper body clothing?: A Lot Help from another person to put on and taking off regular lower body clothing?: Total 6 Click Score: 10    End of Session Equipment Utilized During Treatment: Gait belt  OT Visit Diagnosis: Other abnormalities of gait and mobility (R26.89);Other symptoms and signs involving cognitive function;Hemiplegia and hemiparesis Hemiplegia - Right/Left: Left Hemiplegia - dominant/non-dominant: Non-Dominant Hemiplegia - caused by: Cerebral infarction   Activity Tolerance Patient tolerated treatment well   Patient Left in chair;with call bell/phone within reach;with chair alarm set;with family/visitor present   Nurse Communication Mobility status        Time: 3664-4034 OT Time Calculation (min): 31 min  Charges: OT General Charges $OT Visit: 1 Visit OT Treatments $Self Care/Home Management : 8-22 mins  Jolaine Artist, OT Acute Rehabilitation Services Pager [phone] Office [place] 05/02/2020, 1:10 PM

## 2020-05-02 NOTE — Progress Notes (Addendum)
PROGRESS NOTE    [name]  GHW:[id number] DOB: 01/23/1961 DOA: 04/26/2020 PCP: Patient, No Pcp Per   Brief Narrative: [name] is a 59 y.o. female with history of HTN, presents to the hospital with elevated blood pressure and slurred speech and found to have an acute stroke.   Assessment & Plan:   Active Problems:   Acute CVA (cerebrovascular accident) ([place])   Accelerated hypertension   Renal dysfunction   Hypokalemia   Hypertensive emergency   Slurred speech   Essential hypertension   Thrombocytopenia (H[facility])   Stage 3b chronic kidney disease   Prediabetes   Chronic combined systolic (congestive) and diastolic (congestive) heart failure ([place])   Acute CVA MRI significant for an acute right ACA branch infarct. Neurology consulted. Transthoracic Echocardiogram significant for no thombus and transcranial doppler significant for negative study. CTA head/neck significant for severe right A1 narrowing. LDL of 82 and hemoglobin A1C of 5.9%. Neurology recommending DAPT x 3 weeks followed by aspirin 81 mg monotherapy. -Continue Aspirin 81 mg and Plavix 75 mg daily -PT/OT recommending CIR  Hypertensive emergency Essential hypertension Patient is not on medication as an outpatient. Patient's blood pressure was liberally controlled in the setting of acute  CVA and is now nearing the end of the window for loose control. Blood pressure a little too controlled this morning -No amlodipine dose today; restart Amlodipine 10 mg daily on 8/24 -Decrease to Hydralazine 75 mg TID -Continue Labetalol prn  AKI on CKD stage IIIb Likely diagnosis. Creatinine of 1.8 on admission which improved to 1.55 with IV fluids. Stable.  Thrombocytopenia Unknown chronicity. Resolved.  Anemia Normocytic. Unknown chronicity. Iron of 18 with ferritin of 68 and TIBC of 267. Possible she has a mix of chronic disease and iron deficiency -Iron supplementation   DVT prophylaxis: Lovenox Code Status:    Code Status: DNR Family Communication: Daughter at bedside Disposition Plan: Discharge to CIR likely tomorrow when bed available   Consultants:   Neurology  Procedures:   TRANSTHORACIC ECHOCARDIOGRAM (04/27/2020) IMPRESSIONS    1. Left ventricular ejection fraction, by estimation, is 60 to 65%. The  left ventricle has normal function. The left ventricle has no regional  wall motion abnormalities. There is mild left ventricular hypertrophy.  Left ventricular diastolic parameters  are consistent with Grade II diastolic dysfunction (pseudonormalization).  Elevated left ventricular end-diastolic pressure.  2. Right ventricular systolic function is normal. The right ventricular  size is normal.  3. The mitral valve is normal in structure. Trivial mitral valve  regurgitation. No evidence of mitral stenosis.  4. The aortic valve is normal in structure. Aortic valve regurgitation is  not visualized. Mild aortic valve sclerosis is present, with no evidence  of aortic valve stenosis.  5. The inferior vena cava is normal in size with greater than 50%  respiratory variability, suggesting right atrial pressure of 3 mmHg.  Antimicrobials:  None    Subjective: No concerns  Objective: Vitals:   05/01/20 1940 05/02/20 0017 05/02/20 0318 05/02/20 0754  BP: 140/90 (!) 170/78 (!) 172/85 135/62  Pulse: (!) 109 (!) 104 (!) 106 (!) 103  Resp: 18 20 19 16   Temp: 98.4 F (36.9 C) 97.9 F (36.6 C) 98.6 F (37 C) 98.9 F (37.2 C)  TempSrc: Axillary Axillary Axillary Oral  SpO2: 99% 99% 100% 99%  Weight:      Height:        Intake/Output Summary (Last 24 hours) at 05/02/2020 0931 Last data filed at 05/02/2020 0300 Gross  per 24 hour  Intake --  Output 600 ml  Net -600 ml   Filed Weights   04/26/20 2127  Weight: 72.6 kg    Examination:  General exam: Appears calm and comfortable Respiratory system: Clear to auscultation. Respiratory effort normal. Cardiovascular system: S1  & S2 heard, RRR. 2/6 systolic murmur. Gastrointestinal system: Abdomen is nondistended, soft and nontender. No organomegaly or masses felt. Normal bowel sounds heard. Central nervous system: Alert. Dysarthria. Musculoskeletal: No edema. No calf tenderness Skin: No cyanosis. No rashes Psychiatry: Judgement and insight appear normal. Mood & affect appropriate.     Data Reviewed: I have personally reviewed following labs and imaging studies  CBC Lab Results  Component Value Date   WBC 15.2 (H) 05/02/2020   RBC 4.06 05/02/2020   HGB 11.4 (L) 05/02/2020   HCT 35.1 (L) 05/02/2020   MCV 86.5 05/02/2020   MCH 28.1 05/02/2020   PLT 168 05/02/2020   MCHC 32.5 05/02/2020   RDW 14.6 05/02/2020   LYMPHSABS 1.6 04/27/2020   MONOABS 0.8 04/27/2020   EOSABS 0.1 04/27/2020   BASOSABS 0.1 58/52/7782     Last metabolic panel Lab Results  Component Value Date   NA 141 04/30/2020   K 3.9 04/30/2020   CL 109 04/30/2020   CO2 25 04/30/2020   BUN 18 04/30/2020   CREATININE 1.55 (H) 04/30/2020   GLUCOSE 111 (H) 04/30/2020   GFRNONAA 37 (L) 04/30/2020   GFRAA 42 (L) 04/30/2020   CALCIUM 9.2 04/30/2020   ANIONGAP 7 04/30/2020    CBG (last 3)  No results for input(s): GLUCAP in the last 72 hours.   GFR: Estimated Creatinine Clearance: 38.7 mL/min (A) (by C-G formula based on SCr of 1.55 mg/dL (H)).  Coagulation Profile: Recent Labs  Lab 04/27/20 0237  INR 1.1    Recent Results (from the past 240 hour(s))  SARS Coronavirus 2 by RT PCR (hospital order, performed in [facility] hospital lab) Nasopharyngeal Nasopharyngeal Swab     Status: None   Collection Time: 04/27/20  3:51 AM   Specimen: Nasopharyngeal Swab  Result Value Ref Range Status   SARS Coronavirus 2 NEGATIVE NEGATIVE Final    Comment: (NOTE) SARS-CoV-2 target nucleic acids are NOT DETECTED.  The SARS-CoV-2 RNA is generally detectable in upper and lower respiratory specimens during the acute phase of infection. The  lowest concentration of SARS-CoV-2 viral copies this assay can detect is 250 copies / mL. A negative result does not preclude SARS-CoV-2 infection and should not be used as the sole basis for treatment or other patient management decisions.  A negative result may occur with improper specimen collection / handling, submission of specimen other than nasopharyngeal swab, presence of viral mutation(s) within the areas targeted by this assay, and inadequate number of viral copies (<250 copies / mL). A negative result must be combined with clinical observations, patient history, and epidemiological information.  Fact Sheet for Patients:   StrictlyIdeas.no  Fact Sheet for Healthcare Providers: BankingDealers.co.za  This test is not yet approved or  cleared by the [place] FDA and has been authorized for detection and/or diagnosis of SARS-CoV-2 by FDA under an Emergency Use Authorization (EUA).  This EUA will remain in effect (meaning this test can be used) for the duration of the COVID-19 declaration under Section 564(b)(1) of the Act, 21 U.S.C. section 360bbb-3(b)(1), unless the authorization is terminated or revoked sooner.  Performed at [facility], [place] [address], [place], [place] [address]  Radiology Studies: No results found.      Scheduled Meds: . [START ON 05/03/2020] amLODipine  10 mg Oral Daily  . aspirin  325 mg Oral Daily   Or  . aspirin  300 mg Rectal Daily  . atorvastatin  40 mg Oral Daily  . clopidogrel  75 mg Oral Daily  . enoxaparin (LOVENOX) injection  40 mg Subcutaneous Q24H  . feeding supplement  1 Container Oral TID BM  . hydrALAZINE  75 mg Oral Q8H  . multivitamin with minerals  1 tablet Oral Daily   Continuous Infusions:   LOS: 5 days     Cordelia Poche, MD [facility] 05/02/2020, 9:31 AM  If 7PM-7AM, please contact night-coverage [email or web address]

## 2020-05-02 NOTE — H&P (Signed)
Physical Medicine and Rehabilitation Admission H&P    Chief Complaint  Patient presents with  . Hypertension  : HPI: [name] is a 59 year old right-handed female with history of hypertension/CKD not on medication.  She lives with her 72 year old son and 2 adult daughters.  1 level home 2 steps to entry.  Independent prior to admission working at [employer].  Presented 04/27/2020 with left hemiparesis and dysarthria.  Systolic blood pressure 884 diastolic 166.  Admission chemistries potassium 2.9 BUN 26 creatinine 1.81 alcohol negative urine drug screen negative.  CT of the head showed hyperattenuating focus in the right thalamus age likely reflecting sequela of lacunar type infarct likely remote though in the absence of comparison overall age-indeterminate.  Patient did not receive TPA.  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  Severe chronic small vessel ischemia.  CT angiogram of head and neck severe right A2 segment narrowing correlating with acute infarct of the preceding MRI.  50% right cavernous ICA stenosis 55% left paraclinoid ICA stenosis.  Echocardiogram with ejection fraction 06% grade 2 diastolic dysfunction.  Presently on aspirin and Plavix for CVA prophylaxis x3 weeks and aspirin alone.  Subcutaneous Lovenox for DVT prophylaxis.  Patient did have a mild bump in creatinine 2.21 and received a fluid bolus.  Permissive hypertension closely monitoring.  Therapy evaluations completed patient was admitted for a comprehensive rehab program.  LBM was Saturday per pt AND RN in room. Voiding using purewick-  Perseverating on something stuck in teeth- Said her LUE/LLE were hurting, but they gave her tylenol and now feel good.     Review of Systems  Constitutional: Positive for malaise/fatigue. Negative for chills and fever.  HENT: Negative for hearing loss.   Eyes: Negative for blurred vision, double vision and photophobia.  Respiratory: Negative for shortness of breath.     Cardiovascular: Negative for chest pain, palpitations and leg swelling.  Gastrointestinal: Positive for constipation. Negative for heartburn, nausea and vomiting.  Genitourinary: Negative for dysuria, flank pain and hematuria.  Musculoskeletal: Positive for myalgias.  Skin: Negative for rash.  Neurological: Positive for dizziness, speech change, weakness and headaches.  All other systems reviewed and are negative.  Past Medical History:  Diagnosis Date  . Arthritis   . Hypertension    not taking medications   Past Surgical History:  Procedure Laterality Date  . CESAREAN SECTION     Family History  Problem Relation Age of Onset  . Cancer Mother   . Hypertension Father   . Heart disease Father   . Heart disease Brother   . Stroke Neg Hx    Social History:  reports that she has never smoked. She has never used smokeless tobacco. She reports that she does not drink alcohol and does not use drugs. Allergies: No Known Allergies Medications Prior to Admission  Medication Sig Dispense Refill  . ibuprofen (ADVIL) 200 MG tablet Take 400 mg by mouth every 6 (six) hours as needed for headache or mild pain.      Drug Regimen Review Drug regimen was reviewed and remains appropriate with no significant issues identified  Home: Home Living Family/patient expects to be discharged to:: Private residence Living Arrangements: Children Available Help at Discharge: Family Type of Home: House Home Access: Stairs to enter Technical brewer of Steps: 2 Entrance Stairs-Rails: Right, Left Home Layout: One level Bathroom Shower/Tub: Chiropodist: Standard Home Equipment: None Additional Comments: pt lives with 53 y.o. son and has 2 adult daughters who  are able to assist as well. Pt's daughter [name] works weekends and is available during weekdays for support/assist.  Lives With: Son   Functional History: Prior Function Level of Independence:  Independent Comments: independent and working at [employer], driving   Functional Status:  Mobility: Bed Mobility Overal bed mobility: Needs Assistance Bed Mobility: Rolling, Supine to Sit Rolling: Max assist, +2 for physical assistance Supine to sit: Max assist, +2 for physical assistance, HOB elevated, +2 for safety/equipment Sit to sidelying: Total assist, +2 for safety/equipment, +2 for physical assistance General bed mobility comments: pt able to reach across body with R UE to bed rail, needs reinforcement to leave it there and to pull herself up, initiates movement of R LE to EOB but ultimately requires maxAx2 for LE mangement off bed, trunk to upright and pad scoot to EoB Transfers Overall transfer level: Needs assistance Equipment used: 2 person hand held assist Transfers: Sit to/from Stand, Stand Pivot Transfers Sit to Stand: Max assist, +2 physical assistance Stand pivot transfers: Max assist, +2 physical assistance Squat pivot transfers: Total assist, +2 physical assistance General transfer comment: Max assist and multimodal cues to get to midline and initiate sit to stand (from bed and then from recliner); good hip and knee extension weight bearing response when center of mass translated over feet; Max assist to pivot to recliner      ADL: ADL Overall ADL's : Needs assistance/impaired Grooming: Moderate assistance, Bed level Grooming Details (indicate cue type and reason): able to wash face using R hand, but mod asist required for bimanual tasks  Upper Body Bathing: Moderate assistance, Sitting Lower Body Bathing: +2 for physical assistance, +2 for safety/equipment, Sit to/from stand, Total assistance Upper Body Dressing : Maximal assistance, Sitting Lower Body Dressing: Total assistance, +2 for physical assistance, +2 for safety/equipment, Sit to/from stand Lower Body Dressing Details (indicate cue type and reason): total assist for socks, requires +2 sit to stand  Toilet  Transfer: Total assistance, +2 for physical assistance, +2 for safety/equipment, Stand-pivot Toilet Transfer Details (indicate cue type and reason): simulated to recliner  Functional mobility during ADLs: Maximal assistance, Total assistance, +2 for physical assistance, +2 for safety/equipment General ADL Comments: pt limited by impaired cognition, L sided hemiparesis, and impaired balance   Cognition: Cognition Overall Cognitive Status: Impaired/Different from baseline Arousal/Alertness: Awake/alert Orientation Level: Oriented X4 Attention: Sustained Sustained Attention: Impaired Memory: Impaired Memory Impairment: Retrieval deficit (3/5 recalled without cues, 1/5 with category cue, 1/5 with multiple choice) Awareness: Appears intact (after difficulty performing test items, pt able to state difficulty with attention) Problem Solving: Impaired Problem Solving Impairment: Functional basic (clock drawing impaired numbering 5, 10, 15, etc..) Behaviors: Restless, Impulsive Safety/Judgment: Impaired Comments: pt able to verbalize she needs to have help at home Cognition Arousal/Alertness: Awake/alert, Lethargic Behavior During Therapy: Flat affect Overall Cognitive Status: Impaired/Different from baseline Area of Impairment: Attention, Memory, Safety/judgement, Following commands, Awareness, Problem solving Current Attention Level: Sustained Memory: Decreased recall of precautions, Decreased short-term memory Following Commands: Follows one step commands with increased time, Follows multi-step commands inconsistently Safety/Judgement: Decreased awareness of safety, Decreased awareness of deficits Awareness: Emergent Problem Solving: Slow processing, Decreased initiation, Difficulty sequencing, Requires verbal cues, Requires tactile cues General Comments: patient awake initally but becomes lethargic and has difficulty maintaining eyes open during session; requires increased time to follow 1  step commands   Physical Exam: Blood pressure (!) 168/78, pulse (!) 108, temperature 98.2 F (36.8 C), temperature source Oral, resp. rate 17, height 5\' 4"  (1.626  m), weight 72.6 kg, SpO2 100 %. Physical Exam Vitals and nursing note reviewed. Exam conducted with a chaperone present.  Constitutional:      Comments: Awake, lethargic, dysarthria evident, supine in bed- somewhat woozy appearing, NAD  HENT:     Head: Normocephalic and atraumatic.     Comments: L facial droop; couldn't smile for me; tongue midline    Right Ear: External ear normal.     Left Ear: External ear normal.     Nose: Nose normal. No congestion.     Mouth/Throat:     Mouth: Mucous membranes are dry.     Pharynx: Oropharynx is clear. No oropharyngeal exudate.  Eyes:     Comments: Couldn't keep  Eyes open- opened x1 time for ~ 5 seconds, said she tried, but couldn't open either eyelid more than 1 time- and was still open <25% when opened- couldn't see pupils  Cardiovascular:     Comments: RRR- 2-3/6 murmur heard-  Pulmonary:     Comments: CTA B/L- no W/R/R- good air movement- B/L Abdominal:     Comments: Soft, NT, slightly distended- hypoactive BS  Genitourinary:    Comments: purewick in place with medium amber urine Musculoskeletal:     Cervical back: Normal range of motion and neck supple. No rigidity.     Comments: Couldn't follow commands in general very well- even 1 step simple commands- wasn't able to show strength in RUE/RLE, but when asked to squeeze my hand on Hand, she squeezed with her right hand 5/5- when asked to lift L leg, she lifted R leg off bed at least 4/5 in HF 0/5 in LUE/LLE  Skin:    Comments: Heels boggy B/L IV in R antecubital fossa- looks good No skin breakdown on backside per pt's RN- pt refused to turn  Neurological:     Comments: Patient is awake and alert.  Mild dysarthria.  Follows commands some for PA  Leaning to Right in bed- head tilted to R- perseverating on something stuck in  teeth- couldn't change topic.  No difference in sensation on face B/L Light touch in tact in all 4 extremities, per pt.   Psychiatric:     Comments: Sing song voice/responses- vague; very lethargic     Results for orders placed or performed during the hospital encounter of 04/26/20 (from the past 48 hour(s))  CBC     Status: Abnormal   Collection Time: 05/02/20  2:13 AM  Result Value Ref Range   WBC 15.2 (H) 4.0 - 10.5 K/uL   RBC 4.06 3.87 - 5.11 MIL/uL   Hemoglobin 11.4 (L) 12.0 - 15.0 g/dL   HCT 35.1 (L) 36 - 46 %   MCV 86.5 80.0 - 100.0 fL   MCH 28.1 26.0 - 34.0 pg   MCHC 32.5 30.0 - 36.0 g/dL   RDW 14.6 11.5 - 15.5 %   Platelets 168 150 - 400 K/uL   nRBC 0.0 0.0 - 0.2 %    Comment: Performed at [facility], [place] [address] [place], [place] [address]  Iron and TIBC     Status: Abnormal   Collection Time: 05/02/20  2:13 AM  Result Value Ref Range   Iron 18 (L) 28 - 170 ug/dL   TIBC 267 250 - 450 ug/dL   Saturation Ratios 7 (L) 10.4 - 31.8 %   UIBC 249 ug/dL    Comment: Performed at [facility], [place] [address] [place], [place] [address]  Ferritin  Status: None   Collection Time: 05/02/20  2:13 AM  Result Value Ref Range   Ferritin 68 11 - 307 ng/mL    Comment: Performed at [facility] [address], [place], [place] [address]  Glucose, capillary     Status: Abnormal   Collection Time: 05/02/20  4:23 PM  Result Value Ref Range   Glucose-Capillary 135 (H) 70 - 99 mg/dL    Comment: Glucose reference range applies only to samples taken after fasting for at least 8 hours.  Urinalysis, Routine w reflex microscopic Urine, Clean Catch     Status: Abnormal   Collection Time: 05/02/20  8:05 PM  Result Value Ref Range   Color, Urine YELLOW YELLOW   APPearance HAZY (A) CLEAR   Specific Gravity, Urine 1.012 1.005 - 1.030   pH 5.0 5.0 - 8.0   Glucose, UA NEGATIVE NEGATIVE mg/dL   Hgb urine dipstick NEGATIVE NEGATIVE   Bilirubin Urine NEGATIVE NEGATIVE    Ketones, ur NEGATIVE NEGATIVE mg/dL   Protein, ur NEGATIVE NEGATIVE mg/dL   Nitrite NEGATIVE NEGATIVE   Leukocytes,Ua NEGATIVE NEGATIVE    Comment: Performed at [facility] [address], [place], [place] [address]  CBC     Status: Abnormal   Collection Time: 05/03/20  3:09 AM  Result Value Ref Range   WBC 14.1 (H) 4.0 - 10.5 K/uL   RBC 3.92 3.87 - 5.11 MIL/uL   Hemoglobin 11.1 (L) 12.0 - 15.0 g/dL   HCT 34.4 (L) 36 - 46 %   MCV 87.8 80.0 - 100.0 fL   MCH 28.3 26.0 - 34.0 pg   MCHC 32.3 30.0 - 36.0 g/dL   RDW 14.6 11.5 - 15.5 %   Platelets 147 (L) 150 - 400 K/uL    Comment: REPEATED TO VERIFY   nRBC 0.0 0.0 - 0.2 %    Comment: Performed at [facility], [place] [address], [place], [place] [address]  Basic metabolic panel     Status: Abnormal   Collection Time: 05/03/20  3:09 AM  Result Value Ref Range   Sodium 139 135 - 145 mmol/L   Potassium 3.7 3.5 - 5.1 mmol/L   Chloride 104 98 - 111 mmol/L   CO2 23 22 - 32 mmol/L   Glucose, Bld 102 (H) 70 - 99 mg/dL    Comment: Glucose reference range applies only to samples taken after fasting for at least 8 hours.   BUN 44 (H) 6 - 20 mg/dL   Creatinine, Ser 2.21 (H) 0.44 - 1.00 mg/dL   Calcium 9.5 8.9 - 10.3 mg/dL   GFR calc non Af Amer 24 (L) >60 mL/min   GFR calc Af Amer 28 (L) >60 mL/min   Anion gap 12 5 - 15    Comment: Performed at [facility] [address] [place], [place] [address]   CT HEAD WO CONTRAST  Result Date: 05/02/2020 CLINICAL DATA:  Status post right ACA infarct EXAM: CT HEAD WITHOUT CONTRAST TECHNIQUE: Contiguous axial images were obtained from the base of the skull through the vertex without intravenous contrast. COMPARISON:  04/27/2020 FINDINGS: Brain: Geographic area of decreased attenuation is noted in the distribution of the distal aspect of the right anterior cerebral artery similar to that seen on recent MRI consistent with subacute infarct. Basal ganglia calcifications are seen. Mild  chronic white matter ischemic changes are seen as well. Stable right thalamic lacunar infarct is noted as well. No acute hemorrhage or  new focal infarct is seen. Vascular: No hyperdense vessel or unexpected calcification. Skull: Normal. Negative for fracture or focal lesion. Sinuses/Orbits: Mucosal retention cyst is noted in the left maxillary antrum stable from the prior study. Other: None. IMPRESSION: Geographic area of decreased attenuation in the distribution of the right anterior cerebral artery consistent with the ischemia seen on recent MRI. No new focal abnormality is seen. Electronically Signed   By: Inez Catalina M.D.   On: 05/02/2020 17:56       Medical Problem List and Plan: 1.  Left side weakness and dysarthria secondary to right ACA infarction  -patient may  shower  -ELOS/Goals:  Goals Supervision to min A- goals 2.5- 3weeks 2.  Antithrombotics: -DVT/anticoagulation: Lovenox  -antiplatelet therapy: Aspirin 325 mg daily and Plavix 75 mg daily x3 weeks then aspirin alone 3. Pain Management: Tylenol as needed 4. Mood: Provide emotional support  -antipsychotic agents: N/A 5. Neuropsych: This patient is? capable of making decisions on her own behalf. 6. Skin/Wound Care: Routine skin checks- of note, heels boggy B/L 7. Fluids/Electrolytes/Nutrition: Routine in and outs with follow-up chemistries 8.  Hypertension- was 267/160 upon admission.  Hydralazine 75 mg every 8 hours, Norvasc 10 mg daily.  Monitor with increased mobility 9.  Hyperlipidemia.  Lipitor 10.  AKI on CKD.  Follow-up chemistries.  Normal renal function 2017 but no recent visits, does not have a PCP.  Suspect creatinine chronically elevated due to uncontrolled hypertension. Doesn't have PCP.  11. Lethargy- suggest some amantadine to wake pt up, so can participate in therapies 12. Leukocytosis- U/A (-)- WBC is 14k- down from 15k- but up from 9.4k- will monitor  Cathlyn Parsons, PA-C 05/03/2020    I have personally  performed a face to face diagnostic evaluation of this patient and formulated the key components of the plan.  Additionally, I have personally reviewed laboratory data, imaging studies, as well as relevant notes and concur with the physician assistant's documentation above.   The patient's status has not changed from the original H&P.  Any changes in documentation from the acute care chart have been noted above.

## 2020-05-02 NOTE — Progress Notes (Signed)
Physical Therapy Treatment Patient Details Name: [name] MRN: [id number] DOB: [birth year] Today's Date: 05/02/2020    History of Present Illness Pt is a 59 y/o female with PMH of HTN, presenting with elevated BP, L hemiparesis, and slurred speech. MRI reveals acute Rt ACA CVA.     PT Comments    Continuing work on functional mobility and activity tolerance;  Co-session with OT to address sitting balance, transfers, facilitation of finding midline with sitting/standing activity; heavy push towards weaker L side in sitting; 2 person Max assist and multimodal cues to get to midline and initiate sit to stand (from bed and then from recliner); good hip and knee extension weight bearing response when center of mass translated over feet; significant difficulty stepping feet during pivot to recliner (on pt's L side); Took opportunities to interact with Miss [name] from L side, and encouraged daughter to do so as well; Continue to recommend comprehensive inpatient rehab (CIR) for post-acute therapy needs.   Follow Up Recommendations  CIR     Equipment Recommendations  Other (comment) (TBA)    Recommendations for Other Services       Precautions / Restrictions Precautions Precautions: Other (comment);Fall Precaution Comments: L hemi, notable tendency to push with RUE to weaker L side Restrictions Weight Bearing Restrictions: No    Mobility  Bed Mobility Overal bed mobility: Needs Assistance Bed Mobility: Rolling;Supine to Sit Rolling: Max assist;+2 for physical assistance   Supine to sit: Max assist;+2 for physical assistance;HOB elevated;+2 for safety/equipment     General bed mobility comments: pt able to reach across body with R UE to bed rail, needs reinforcement to leave it there and to pull herself up, initiates movement of R LE to EOB but ultimately requires maxAx2 for LE mangement off bed, trunk to upright and pad scoot to EoB  Transfers Overall transfer level: Needs  assistance Equipment used: 2 person hand held assist Transfers: Sit to/from [facility] Sit to Stand: Max assist;+2 physical assistance Stand pivot transfers: Max assist;+2 physical assistance       General transfer comment: Max assist and multimodal cues to get to midline and initiate sit to stand (from bed and then from recliner); good hip and knee extension weight bearing response when center of mass translated over feet; Max assist to pivot to recliner  Ambulation/Gait                 Stairs             Wheelchair Mobility    Modified Rankin (Stroke Patients Only) Modified Rankin (Stroke Patients Only) Pre-Morbid Rankin Score: No symptoms Modified Rankin: Severe disability     Balance Overall balance assessment: Needs assistance Sitting-balance support: No upper extremity supported;Feet supported Sitting balance-Leahy Scale: Zero Sitting balance - Comments: continues to requires total assist for sitting balance, improved with leaning on R elbow but unable to sustain  Postural control: Posterior lean;Left lateral lean Standing balance support: Bilateral upper extremity supported;During functional activity Standing balance-Leahy Scale: Zero                              Cognition Arousal/Alertness: Awake/alert;Lethargic Behavior During Therapy: Flat affect Overall Cognitive Status: Impaired/Different from baseline Area of Impairment: Attention;Memory;Safety/judgement;Following commands;Awareness;Problem solving                   Current Attention Level: Sustained Memory: Decreased recall of precautions;Decreased short-term memory Following Commands: Follows one step commands  with increased time;Follows multi-step commands inconsistently Safety/Judgement: Decreased awareness of safety;Decreased awareness of deficits Awareness: Emergent Problem Solving: Slow processing;Decreased initiation;Difficulty sequencing;Requires  verbal cues;Requires tactile cues General Comments: patient awake initally but becomes lethargic and has difficulty maintaining eyes open during session; requires increased time to follow 1 step commands       Exercises      General Comments General comments (skin integrity, edema, etc.): PROM and gentle stretch provided from shoulder to hand on L UE       Pertinent Vitals/Pain Pain Assessment: No/denies pain    Home Living                      Prior Function            PT Goals (current goals can now be found in the care plan section) Acute Rehab PT Goals Patient Stated Goal: to get better  PT Goal Formulation: With patient Time For Goal Achievement: 05/12/20 Potential to Achieve Goals: Good Progress towards PT goals: Progressing toward goals    Frequency    Min 4X/week      PT Plan Current plan remains appropriate    Co-evaluation PT/OT/SLP Co-Evaluation/Treatment: Yes Reason for Co-Treatment: Complexity of the patient's impairments (multi-system involvement);Necessary to address cognition/behavior during functional activity;For patient/therapist safety;To address functional/ADL transfers PT goals addressed during session: Mobility/safety with mobility OT goals addressed during session: ADL's and self-care      AM-PAC PT "6 Clicks" Mobility   Outcome Measure  Help needed turning from your back to your side while in a flat bed without using bedrails?: A Lot Help needed moving from lying on your back to sitting on the side of a flat bed without using bedrails?: Total Help needed moving to and from a bed to a chair (including a wheelchair)?: Total Help needed standing up from a chair using your arms (e.g., wheelchair or bedside chair)?: A Lot Help needed to walk in hospital room?: Total Help needed climbing 3-5 steps with a railing? : Total 6 Click Score: 8    End of Session Equipment Utilized During Treatment: Gait belt Activity Tolerance: Patient  tolerated treatment well Patient left: in chair;with call bell/phone within reach;with chair alarm set;with family/visitor present Nurse Communication: Mobility status PT Visit Diagnosis: Other abnormalities of gait and mobility (R26.89);Muscle weakness (generalized) (M62.81);Difficulty in walking, not elsewhere classified (R26.2);Other symptoms and signs involving the nervous system (R29.898);Hemiplegia and hemiparesis Hemiplegia - Right/Left: Left Hemiplegia - dominant/non-dominant: Non-dominant Hemiplegia - caused by: Cerebral infarction     Time: 5009-3818 PT Time Calculation (min) (ACUTE ONLY): 31 min  Charges:  $Therapeutic Activity: 8-22 mins                     Roney Marion, PT  Acute Rehabilitation Services Pager [phone] Office [facility] 05/02/2020, 1:44 PM

## 2020-05-02 NOTE — Progress Notes (Signed)
Inpatient Rehabilitation Admissions Coordinator  I met with [patient with her daughter, [name], at bedside. We discussed goals and expectations of an inpt rehab admit. They are both in agreement. I have insurance approval but no bed available until Tuesday. I will follow up tomorrow.  Danne Baxter, RN, MSN Rehab Admissions Coordinator [phone] 05/02/2020 10:52 AM

## 2020-05-03 ENCOUNTER — Encounter: Payer: Self-pay | Admitting: Internal Medicine

## 2020-05-03 ENCOUNTER — Encounter (HOSPITAL_COMMUNITY): Payer: Self-pay | Admitting: Physical Medicine & Rehabilitation

## 2020-05-03 ENCOUNTER — Inpatient Hospital Stay (HOSPITAL_COMMUNITY)
Admission: RE | Admit: 2020-05-03 | Discharge: 2020-06-01 | DRG: 057 | Disposition: A | Discharge status: Home Health | Payer: 59 | Source: Intra-hospital | Attending: Physical Medicine & Rehabilitation | Admitting: Physical Medicine & Rehabilitation

## 2020-05-03 ENCOUNTER — Other Ambulatory Visit: Payer: Self-pay

## 2020-05-03 DIAGNOSIS — R1032 Left lower quadrant pain: Secondary | ICD-10-CM

## 2020-05-03 DIAGNOSIS — I69391 Dysphagia following cerebral infarction: Secondary | ICD-10-CM | POA: Diagnosis not present

## 2020-05-03 DIAGNOSIS — D75839 Thrombocytosis, unspecified: Secondary | ICD-10-CM

## 2020-05-03 DIAGNOSIS — I10 Essential (primary) hypertension: Secondary | ICD-10-CM | POA: Diagnosis not present

## 2020-05-03 DIAGNOSIS — R109 Unspecified abdominal pain: Secondary | ICD-10-CM

## 2020-05-03 DIAGNOSIS — E876 Hypokalemia: Secondary | ICD-10-CM | POA: Diagnosis not present

## 2020-05-03 DIAGNOSIS — I6300 Cerebral infarction due to thrombosis of unspecified precerebral artery: Secondary | ICD-10-CM

## 2020-05-03 DIAGNOSIS — D62 Acute posthemorrhagic anemia: Secondary | ICD-10-CM

## 2020-05-03 DIAGNOSIS — D696 Thrombocytopenia, unspecified: Secondary | ICD-10-CM | POA: Diagnosis present

## 2020-05-03 DIAGNOSIS — D72829 Elevated white blood cell count, unspecified: Secondary | ICD-10-CM | POA: Diagnosis present

## 2020-05-03 DIAGNOSIS — K5901 Slow transit constipation: Secondary | ICD-10-CM | POA: Diagnosis not present

## 2020-05-03 DIAGNOSIS — R52 Pain, unspecified: Secondary | ICD-10-CM

## 2020-05-03 DIAGNOSIS — I639 Cerebral infarction, unspecified: Secondary | ICD-10-CM | POA: Diagnosis not present

## 2020-05-03 DIAGNOSIS — I129 Hypertensive chronic kidney disease with stage 1 through stage 4 chronic kidney disease, or unspecified chronic kidney disease: Secondary | ICD-10-CM | POA: Diagnosis present

## 2020-05-03 DIAGNOSIS — L89159 Pressure ulcer of sacral region, unspecified stage: Secondary | ICD-10-CM | POA: Diagnosis not present

## 2020-05-03 DIAGNOSIS — E785 Hyperlipidemia, unspecified: Secondary | ICD-10-CM | POA: Diagnosis present

## 2020-05-03 DIAGNOSIS — K219 Gastro-esophageal reflux disease without esophagitis: Secondary | ICD-10-CM | POA: Diagnosis present

## 2020-05-03 DIAGNOSIS — Z79899 Other long term (current) drug therapy: Secondary | ICD-10-CM | POA: Diagnosis not present

## 2020-05-03 DIAGNOSIS — K0889 Other specified disorders of teeth and supporting structures: Secondary | ICD-10-CM | POA: Diagnosis present

## 2020-05-03 DIAGNOSIS — I69392 Facial weakness following cerebral infarction: Secondary | ICD-10-CM

## 2020-05-03 DIAGNOSIS — I69322 Dysarthria following cerebral infarction: Secondary | ICD-10-CM | POA: Diagnosis not present

## 2020-05-03 DIAGNOSIS — R5383 Other fatigue: Secondary | ICD-10-CM

## 2020-05-03 DIAGNOSIS — N189 Chronic kidney disease, unspecified: Secondary | ICD-10-CM | POA: Diagnosis present

## 2020-05-03 DIAGNOSIS — I63012 Cerebral infarction due to thrombosis of left vertebral artery: Secondary | ICD-10-CM

## 2020-05-03 DIAGNOSIS — M170 Bilateral primary osteoarthritis of knee: Secondary | ICD-10-CM | POA: Diagnosis present

## 2020-05-03 DIAGNOSIS — N179 Acute kidney failure, unspecified: Secondary | ICD-10-CM | POA: Diagnosis not present

## 2020-05-03 DIAGNOSIS — L8990 Pressure ulcer of unspecified site, unspecified stage: Secondary | ICD-10-CM | POA: Insufficient documentation

## 2020-05-03 DIAGNOSIS — D473 Essential (hemorrhagic) thrombocythemia: Secondary | ICD-10-CM | POA: Diagnosis not present

## 2020-05-03 DIAGNOSIS — F40240 Claustrophobia: Secondary | ICD-10-CM | POA: Diagnosis present

## 2020-05-03 DIAGNOSIS — R0989 Other specified symptoms and signs involving the circulatory and respiratory systems: Secondary | ICD-10-CM

## 2020-05-03 DIAGNOSIS — R059 Cough, unspecified: Secondary | ICD-10-CM

## 2020-05-03 DIAGNOSIS — G8110 Spastic hemiplegia affecting unspecified side: Secondary | ICD-10-CM

## 2020-05-03 DIAGNOSIS — I69354 Hemiplegia and hemiparesis following cerebral infarction affecting left non-dominant side: Secondary | ICD-10-CM | POA: Diagnosis present

## 2020-05-03 LAB — CBC
HCT: 34.4 % — ABNORMAL LOW (ref 36.0–46.0)
HCT: 31.9 % — ABNORMAL LOW (ref 36.0–46.0)
Hemoglobin: 11.1 g/dL — ABNORMAL LOW (ref 12.0–15.0)
Hemoglobin: 10.1 g/dL — ABNORMAL LOW (ref 12.0–15.0)
MCH: 28.3 pg (ref 26.0–34.0)
MCH: 27.7 pg (ref 26.0–34.0)
MCHC: 32.3 g/dL (ref 30.0–36.0)
MCHC: 31.7 g/dL (ref 30.0–36.0)
MCV: 87.8 fL (ref 80.0–100.0)
MCV: 87.6 fL (ref 80.0–100.0)
Platelets: 147 10*3/uL — ABNORMAL LOW (ref 150–400)
Platelets: 155 10*3/uL (ref 150–400)
RBC: 3.92 MIL/uL (ref 3.87–5.11)
RBC: 3.64 MIL/uL — ABNORMAL LOW (ref 3.87–5.11)
RDW: 14.6 % (ref 11.5–15.5)
RDW: 14.6 % (ref 11.5–15.5)
WBC: 14.1 10*3/uL — ABNORMAL HIGH (ref 4.0–10.5)
WBC: 12.6 10*3/uL — ABNORMAL HIGH (ref 4.0–10.5)
nRBC: 0 % (ref 0.0–0.2)
nRBC: 0 % (ref 0.0–0.2)

## 2020-05-03 LAB — BASIC METABOLIC PANEL WITH GFR
Anion gap: 12 (ref 5–15)
BUN: 44 mg/dL — ABNORMAL HIGH (ref 6–20)
CO2: 23 mmol/L (ref 22–32)
Calcium: 9.5 mg/dL (ref 8.9–10.3)
Chloride: 104 mmol/L (ref 98–111)
Creatinine, Ser: 2.21 mg/dL — ABNORMAL HIGH (ref 0.44–1.00)
GFR calc Af Amer: 28 mL/min — ABNORMAL LOW (ref >60)
GFR calc non Af Amer: 24 mL/min — ABNORMAL LOW (ref >60)
Glucose, Bld: 102 mg/dL — ABNORMAL HIGH (ref 70–99)
Potassium: 3.7 mmol/L (ref 3.5–5.1)
Sodium: 139 mmol/L (ref 135–145)

## 2020-05-03 LAB — URINE CULTURE

## 2020-05-03 LAB — CREATININE, SERUM
Creatinine, Ser: 2.03 mg/dL — ABNORMAL HIGH (ref 0.44–1.00)
GFR calc Af Amer: 31 mL/min — ABNORMAL LOW (ref >60)
GFR calc non Af Amer: 26 mL/min — ABNORMAL LOW (ref >60)

## 2020-05-03 MED ORDER — SODIUM CHLORIDE 0.9 % IV BOLUS
1000.0000 mL | Freq: Once | INTRAVENOUS | Status: AC
  Administered 2020-05-03: 1000 mL via INTRAVENOUS

## 2020-05-03 MED ORDER — FERROUS SULFATE 325 (65 FE) MG PO TABS
325.0000 mg | ORAL_TABLET | Freq: Two times a day (BID) | ORAL | Status: DC
  Administered 2020-05-03 – 2020-05-22 (×39): 325 mg via ORAL
  Filled 2020-05-03 (×38): qty 1

## 2020-05-03 MED ORDER — AMLODIPINE BESYLATE 10 MG PO TABS
10.0000 mg | ORAL_TABLET | Freq: Every day | ORAL | Status: DC
  Administered 2020-05-04 – 2020-06-01 (×29): 10 mg via ORAL
  Filled 2020-05-03 (×14): qty 1
  Filled 2020-05-03: qty 2
  Filled 2020-05-03 (×14): qty 1

## 2020-05-03 MED ORDER — SENNOSIDES-DOCUSATE SODIUM 8.6-50 MG PO TABS
1.0000 | ORAL_TABLET | Freq: Every evening | ORAL | Status: DC | PRN
Start: 2020-05-03 — End: 2020-05-19
  Administered 2020-05-07: 1 via ORAL
  Filled 2020-05-03: qty 1

## 2020-05-03 MED ORDER — BOOST BREEZE PO LIQD: 1.0000 | Freq: Three times a day (TID) | ORAL | 0 refills | Status: DC

## 2020-05-03 MED ORDER — ASPIRIN 81 MG PO CHEW: 81.0000 mg | CHEWABLE_TABLET | Freq: Every day | ORAL | Status: DC

## 2020-05-03 MED ORDER — ACETAMINOPHEN 160 MG/5ML PO SOLN
650.0000 mg | ORAL | Status: DC | PRN
  Administered 2020-05-18: 650 mg
  Filled 2020-05-03: qty 20.3

## 2020-05-03 MED ORDER — ADULT MULTIVITAMIN W/MINERALS CH
1.0000 | ORAL_TABLET | Freq: Every day | ORAL | Status: DC
  Administered 2020-05-04 – 2020-05-22 (×19): 1 via ORAL
  Filled 2020-05-03 (×20): qty 1

## 2020-05-03 MED ORDER — ATORVASTATIN CALCIUM 40 MG PO TABS
40.0000 mg | ORAL_TABLET | Freq: Every day | ORAL | Status: DC
  Administered 2020-05-04 – 2020-06-01 (×29): 40 mg via ORAL
  Filled 2020-05-03 (×29): qty 1

## 2020-05-03 MED ORDER — ACETAMINOPHEN 325 MG PO TABS
650.0000 mg | ORAL_TABLET | ORAL | Status: DC | PRN
  Administered 2020-05-04 – 2020-05-29 (×17): 650 mg via ORAL
  Filled 2020-05-03 (×19): qty 2

## 2020-05-03 MED ORDER — ENOXAPARIN SODIUM 40 MG/0.4ML ~~LOC~~ SOLN: 40.0000 mg | SUBCUTANEOUS | Status: DC

## 2020-05-03 MED ORDER — ASPIRIN 325 MG PO TABS
325.0000 mg | ORAL_TABLET | Freq: Every day | ORAL | Status: DC
  Administered 2020-05-04 – 2020-06-01 (×29): 325 mg via ORAL
  Filled 2020-05-03 (×30): qty 1

## 2020-05-03 MED ORDER — ASPIRIN 300 MG RE SUPP
300.0000 mg | Freq: Every day | RECTAL | Status: DC
  Filled 2020-05-03 (×8): qty 1

## 2020-05-03 MED ORDER — ENOXAPARIN SODIUM 30 MG/0.3ML ~~LOC~~ SOLN
30.0000 mg | SUBCUTANEOUS | Status: DC
  Administered 2020-05-04 – 2020-06-01 (×29): 30 mg via SUBCUTANEOUS
  Filled 2020-05-03 (×29): qty 0.3

## 2020-05-03 MED ORDER — AMLODIPINE BESYLATE 10 MG PO TABS: 10.0000 mg | ORAL_TABLET | Freq: Every day | ORAL | Status: DC

## 2020-05-03 MED ORDER — HYDRALAZINE HCL 50 MG PO TABS
75.0000 mg | ORAL_TABLET | Freq: Three times a day (TID) | ORAL | Status: DC
  Administered 2020-05-03 – 2020-05-05 (×5): 75 mg via ORAL
  Filled 2020-05-03 (×5): qty 1

## 2020-05-03 MED ORDER — ACETAMINOPHEN 650 MG RE SUPP: 650.0000 mg | RECTAL | Status: DC | PRN

## 2020-05-03 MED ORDER — ADULT MULTIVITAMIN W/MINERALS CH: 1.0000 | ORAL_TABLET | Freq: Every day | ORAL | Status: AC

## 2020-05-03 MED ORDER — SORBITOL 70 % SOLN
30.0000 mL | Freq: Every day | Status: DC | PRN
  Administered 2020-05-07 – 2020-05-15 (×3): 30 mL via ORAL
  Filled 2020-05-03 (×4): qty 30

## 2020-05-03 MED ORDER — FERROUS SULFATE 325 (65 FE) MG PO TABS: 325.0000 mg | ORAL_TABLET | Freq: Two times a day (BID) | ORAL | 3 refills | Status: DC

## 2020-05-03 MED ORDER — HYDRALAZINE HCL 25 MG PO TABS: 75.0000 mg | ORAL_TABLET | Freq: Three times a day (TID) | ORAL | Status: DC

## 2020-05-03 MED ORDER — CLOPIDOGREL BISULFATE 75 MG PO TABS
75.0000 mg | ORAL_TABLET | Freq: Every day | ORAL | Status: DC
  Administered 2020-05-04 – 2020-05-25 (×22): 75 mg via ORAL
  Filled 2020-05-03 (×22): qty 1

## 2020-05-03 MED ORDER — ATORVASTATIN CALCIUM 40 MG PO TABS: 40.0000 mg | ORAL_TABLET | Freq: Every day | ORAL | Status: DC

## 2020-05-03 MED ORDER — CLOPIDOGREL BISULFATE 75 MG PO TABS: 75.0000 mg | ORAL_TABLET | Freq: Every day | ORAL | Status: DC

## 2020-05-03 NOTE — Progress Notes (Signed)
Cristina Gong, RN  Rehab Admission Coordinator  Physical Medicine and Rehabilitation  PMR Pre-admission      Signed  Date of Service:  05/03/2020 11:07 AM      Related encounter: ED to Hosp-Admission (Discharged) from 04/26/2020 in [facility]      Signed       Show:Clear all [x] Manual[x] Template[x] Copied  Added by: [x] Julious Payer Vertis Kelch, RN  [] Hover for details PMR Admission Coordinator Pre-Admission Assessment   Patient: Deshawn Skelley is an 59 y.o., female MRN: [id number] DOB: 01/15/1961 Height: 5\' 4"  (162.6 cm) Weight: 72.6 kg                                                                                                                                                  Insurance Information HMO:     PPO:      PCP:      IPA:      80/20:      OTHER:  PRIMARY: [place]      Policy#: [id number]      Subscriber: pt CM Name: RV      Phone#: [phone]     Fax#: [phone] Pre-Cert#: [id number] approved until 8/27 when updates are due      Employer: [employer] Benefits:  Phone #: [phone]     Name: 8/20 Eff. Date: 09/11/2019     Deduct: none      Out of Pocket Max: $8550      Life Max: none  CIR: $2500 co pay per day days 1 and 2, then insurance covers 100% with approval      SNF: $2500 co py per day days 1 and 2 Outpatient: $100 co pay per visit      Co-Pay: 30 visits per calender year combined Home Health: 50%      Co-Pay: visits per medical neccesity DME: 50%     Co-Pay: 50% Providers: in network  SECONDARY: none      Policy#:       Phone#:    Development worker, community:       Phone#:    The Engineer, petroleum" for patients in Inpatient Rehabilitation Facilities with attached "Privacy Act [place] Records" was provided and verbally reviewed with: n/a   Emergency Contact Information         Contact Information     Name Relation Home Work [place] Daughter     [phone]    [facility]  Significant other     [phone]    Mosetta Pigeon     [phone]       Current Medical History  Patient Admitting Diagnosis: CVA   History of Present Illness:Shelby Roark is a 59 year old right-handed female with history of hypertension/CKD not on medication.   Presented 04/27/2020 with left hemiparesis and dysarthria.  Systolic blood  pressure 332 diastolic 951.  Admission chemistries potassium 2.9 BUN 26 creatinine 1.81 alcohol negative urine drug screen negative.  CT of the head showed hyper attenuating focus in the right thalamus age likely reflecting sequela of lacunar type infarct likely remote though in the absence of comparison overall age-indeterminate.  Patient did not receive TPA.  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  Severe chronic small vessel ischemia.  CT angiogram of head and neck severe right A2 segment narrowing correlating with acute infarct of the preceding MRI.  50% right cavernous ICA stenosis 55% left para clinoid ICA stenosis.  Echocardiogram with ejection fraction 88% grade 2 diastolic dysfunction.  Presently on aspirin and Plavix for CVA prophylaxis x3 weeks and aspirin alone.  Subcutaneous Lovenox for DVT prophylaxis.  Patient did have a mild bump in creatinine 2.21 and received a fluid bolus.  Permissive hypertension closely monitoring.     Complete NIHSS TOTAL: 11 Glasgow Coma Scale Score: 15   Past Medical History      Past Medical History:  Diagnosis Date  . Arthritis    . Hypertension      not taking medications      Family History  family history includes Cancer in her mother; Heart disease in her brother and father; Hypertension in her father.   Prior Rehab/Hospitalizations:  Has the patient had prior rehab or hospitalizations prior to admission? Yes   Has the patient had major surgery during 100 days prior to admission? No   Current Medications    Current Facility-Administered Medications:  .  acetaminophen (TYLENOL) tablet  650 mg, 650 mg, Oral, Q4H PRN **OR** acetaminophen (TYLENOL) 160 MG/5ML solution 650 mg, 650 mg, Per Tube, Q4H PRN **OR** acetaminophen (TYLENOL) suppository 650 mg, 650 mg, Rectal, Q4H PRN, Karmen Bongo, MD .  amLODipine (NORVASC) tablet 10 mg, 10 mg, Oral, Daily, Mariel Aloe, MD, 10 mg at 05/03/20 1020 .  aspirin tablet 325 mg, 325 mg, Oral, Daily, 325 mg at 05/03/20 1020 **OR** aspirin suppository 300 mg, 300 mg, Rectal, Daily, Karmen Bongo, MD, 300 mg at 04/29/20 4166 .  atorvastatin (LIPITOR) tablet 40 mg, 40 mg, Oral, Daily, Karmen Bongo, MD, 40 mg at 05/03/20 1020 .  clopidogrel (PLAVIX) tablet 75 mg, 75 mg, Oral, Daily, Biby, Sharon L, NP, 75 mg at 05/03/20 1020 .  enoxaparin (LOVENOX) injection 40 mg, 40 mg, Subcutaneous, Q24H, Karmen Bongo, MD, 40 mg at 05/03/20 1058 .  feeding supplement (BOOST / RESOURCE BREEZE) liquid 1 Container, 1 Container, Oral, TID BM, Caren Griffins, MD, 1 Container at 05/01/20 2010 .  ferrous sulfate tablet 325 mg, 325 mg, Oral, BID WC, Mariel Aloe, MD, 325 mg at 05/03/20 1021 .  hydrALAZINE (APRESOLINE) tablet 75 mg, 75 mg, Oral, Q8H, Mariel Aloe, MD, 75 mg at 05/03/20 0527 .  labetalol (NORMODYNE) injection 10 mg, 10 mg, Intravenous, Q2H PRN, Caren Griffins, MD, 10 mg at 04/29/20 0939 .  multivitamin with minerals tablet 1 tablet, 1 tablet, Oral, Daily, Karmen Bongo, MD, 1 tablet at 05/03/20 1021 .  senna-docusate (Senokot-S) tablet 1 tablet, 1 tablet, Oral, QHS PRN, Karmen Bongo, MD   Patients Current Diet:     Diet Order                      Diet Heart Room service appropriate? 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  Independent   Indoor Mobility: Did the patient need assistance with walking from room to room (with or without device)? Independent   Stairs: Did the patient need assistance with internal or external stairs (with or without device)? Independent   Functional Cognition: Did the patient need help planning regular tasks such as shopping or remembering to take medications? Independent   Home Assistive Devices / Equipment Home Assistive Devices/Equipment: None Home Equipment: None   Prior Device Use: Indicate devices/aids used by the patient prior to current illness, exacerbation or injury? None of the above   Current Functional Level Cognition   Arousal/Alertness: Awake/alert Overall Cognitive Status: Impaired/Different from baseline Current Attention Level: Sustained Orientation Level: Oriented X4 Following Commands: Follows one step commands with increased time, Follows multi-step commands inconsistently Safety/Judgement: Decreased awareness of safety, Decreased awareness of deficits General Comments: patient awake initally but becomes lethargic and has difficulty maintaining eyes open during session; requires increased time to follow 1 step commands  Attention: Sustained Sustained Attention: Impaired Memory: Impaired Memory Impairment: Retrieval deficit (3/5 recalled without cues, 1/5 with category cue, 1/5 with multiple choice) Awareness: Appears intact (after difficulty performing test items, pt able to state difficulty with attention) Problem Solving: Impaired Problem Solving  Impairment: Functional basic (clock drawing impaired numbering 5, 10, 15, etc..) Behaviors: Restless, Impulsive Safety/Judgment: Impaired Comments: pt able to verbalize she needs to have help at home    Extremity Assessment (includes Sensation/Coordination)   Upper Extremity Assessment: Defer to OT evaluation LUE Deficits / Details: flexion synergy patterns noted, inital tone in shoulder but fades with PROM, grossly 0/5 shoulder and elbow, 3-/5 hand ; unable to use functionally LUE Sensation: decreased light touch, decreased proprioception LUE Coordination: decreased fine motor, decreased gross motor  Lower Extremity Assessment: RLE deficits/detail, LLE deficits/detail RLE Deficits / Details: Pt with gross weakness on Rt LE (4-/5 for knee flex/ext testing and ankle dorsi/plantar flexion.  LLE Deficits / Details: Pt with extensor tone in supine and resistance to knee flexion and hip abduction. Hamstring tone noted against PROM sitting EOB, decreased with slow ROM. Pt unable to perform AROM on Lt LE. Grosslly  0/5 throughout Lt LE.  LLE Sensation: decreased proprioception, decreased light touch LLE Coordination: decreased gross motor, decreased fine motor     ADLs   Overall ADL's : Needs assistance/impaired Grooming: Moderate assistance, Bed level Grooming Details (indicate cue type and reason): able to wash face using R hand, but mod asist required for bimanual tasks  Upper Body Bathing: Moderate assistance, Sitting Lower Body Bathing: +2 for physical assistance, +2 for safety/equipment, Sit to/from stand, Total assistance Upper Body Dressing : Maximal assistance, Sitting Lower Body Dressing: Total assistance, +2 for physical assistance, +2 for safety/equipment, Sit to/from stand Lower Body Dressing Details (indicate cue type and reason): total assist for socks, requires +2 sit to stand  Toilet Transfer: Total assistance, +2 for physical assistance, +2 for safety/equipment,  Stand-pivot Toilet Transfer Details (indicate cue type and reason): simulated to recliner  Functional mobility during ADLs: Maximal assistance, Total assistance, +2 for physical assistance, +2 for safety/equipment General ADL Comments: pt limited by impaired cognition, L sided hemiparesis, and impaired balance      Mobility   Overal bed mobility: Needs Assistance Bed Mobility: Rolling, Supine to Sit Rolling: Max assist, +2 for physical assistance Supine to sit: Max assist, +2 for physical assistance, HOB elevated, +2 for safety/equipment Sit to sidelying: Total assist, +2 for safety/equipment, +2 for physical assistance General bed mobility comments: pt able to reach across body with R UE to bed rail, needs reinforcement to leave it there and to pull herself up, initiates movement of R LE to EOB but ultimately requires maxAx2 for LE mangement off bed, trunk to upright and pad scoot to EoB     Transfers   Overall transfer level: Needs assistance Equipment used: 2 person hand held assist Transfers: Sit to/from Stand, Stand Pivot Transfers Sit to Stand: Max assist, +2 physical assistance Stand pivot transfers: Max assist, +2 physical assistance Squat pivot transfers: Total assist, +2 physical assistance General transfer comment: Max assist and multimodal cues to get to midline and initiate sit to stand (from bed and then from recliner); good hip and knee extension weight bearing response when center of mass translated over feet; Max assist to pivot to recliner     Ambulation / Gait / Stairs / Proofreader / Balance Dynamic Sitting Balance Sitting balance - Comments: continues to requires total assist for sitting balance, improved with leaning on R elbow but unable to sustain  Balance Overall balance assessment: Needs assistance Sitting-balance support: No upper extremity supported, Feet supported Sitting balance-Leahy Scale: Zero Sitting balance - Comments:  continues to requires total assist for sitting balance, improved with leaning on R elbow but unable to sustain  Postural control: Posterior lean, Left lateral lean Standing balance support: Bilateral upper extremity supported, During functional activity Standing balance-Leahy Scale: Zero     Special needs/care consideration Designated visitor is [name] on day of admit        Previous Home Environment (from acute therapy documentation) Living Arrangements:  (Lives with significant other and 31 year old son)  Lives With: Significant other, Son Marchia Bond, significant other of many years and 80 year old son) Available Help at Discharge: Family, Available 24 hours/day ([name] during the week and MArvin on weekends) Type of Home: House Home Layout: One level Home Access: Stairs to enter Entrance Stairs-Rails: Right, Left Entrance Stairs-Number of Steps: 2 Bathroom Shower/Tub: Chiropodist: Standard Bathroom Accessibility: Yes How Accessible: Accessible via walker Home Care Services: No Additional Comments: lives with Marchia Bond and her 10 year old son, Daughters very involved are adults   Discharge Living Setting Plans for Discharge Living Setting: Patient's home, Lives with (comment) Marchia Bond and 53 year old son) Type of Home at Discharge: House Discharge Home Layout: One level Discharge Home Access: Stairs to enter Entrance Stairs-Rails: Right, Left Entrance Stairs-Number of Steps: 2 Discharge Bathroom Shower/Tub: Tub/shower unit Discharge Bathroom Toilet: Standard Discharge Bathroom Accessibility: Yes How Accessible: Accessible via walker Does the patient have any problems obtaining your medications?: No   Social/Family/Support Systems Patient Roles: Partner, Parent (employee fulltime at [employer]; very active) Sport and exercise psychologist Information: [name], daughter is first contact Anticipated Caregiver: Marchia Bond and [place] Anticipated Ambulance person Information: see  above Ability/Limitations of Caregiver: [name] is a CNA on weekends at [facility] and can be caregiver during the weekl. Marchia Bond works weekday nights and de and the 2 adult daughters will assist on weekends Caregiver Availability: 24/7 Discharge Plan Discussed with Primary Caregiver: Yes Is Caregiver In Agreement with Plan?: Yes Does Caregiver/Family have Issues with Lodging/Transportation while Pt is in Rehab?: No     Goals Patient/Family Goal for Rehab: Min assist PT, OT, and SLP Expected length of stay: ELOS 17 to 21 days Pt/Family Agrees to Admission and willing to participate: Yes Program Orientation Provided & Reviewed with Pt/Caregiver Including Roles  & Responsibilities: Yes     Decrease burden of Care through IP rehab admission: n/a   Possible need for SNF placement upon discharge:not anticipated   Patient Condition: This patient's medical and functional status has changed since the consult dated 04/29/2020 in which the Rehabilitation Physician determined and documented that the patient was potentially appropriate for intensive rehabilitative care in an inpatient rehabilitation facility. Issues have been addressed and update has been discussed with Dr. Naaman Plummer and patient now appropriate for inpatient rehabilitation. Will admit to inpatient rehab today.    Preadmission Screen Completed By:  Cleatrice Burke, RN, 05/03/2020 11:13 AM ______________________________________________________________________   Discussed status with Dr. Dagoberto Ligas on 05/03/2020 at 1115 and received approval for admission today.   Admission Coordinator:  Cleatrice Burke, time 9179 Date 05/03/2020             Cosigned by: [name] Heys, MD at 05/03/2020 12:06 PM  Revision History                Note Details  Author Cristina Gong, RN File Time 05/03/2020 11:15 AM  Author Type Rehab Admission Coordinator Status Signed  Last Editor Cristina Gong, RN Service Physical Medicine and  [place] # [id number] Admit Date 05/03/2020

## 2020-05-03 NOTE — PMR Pre-admission (Signed)
PMR Admission Coordinator Pre-Admission Assessment  Patient: [name] is an 59 y.o., female MRN: [id number] DOB: 05-30-1961 Height: 5\' 4"  (162.6 cm) Weight: 72.6 kg              Insurance Information HMO:     PPO:      PCP:      IPA:      80/20:      OTHER:  PRIMARY: [facility]      Policy#: [id number]      Subscriber: pt CM Name: RV      Phone#: [phone]     Fax#: [phone] Pre-Cert#: [id number] approved until 8/27 when updates are due      Employer: [employer] Benefits:  Phone #: [phone]     Name: 8/20 Eff. Date: 09/11/2019     Deduct: none      Out of Pocket Max: $8550      Life Max: none  CIR: $2500 co pay per day days 1 and 2, then insurance covers 100% with approval      SNF: $2500 co py per day days 1 and 2 Outpatient: $100 co pay per visit      Co-Pay: 30 visits per calender year combined Home Health: 50%      Co-Pay: visits per medical neccesity DME: 50%     Co-Pay: 50% Providers: in network  SECONDARY: none      Policy#:       Phone#:   Development worker, community:       Phone#:   The Engineer, petroleum" for patients in Inpatient Rehabilitation Facilities with attached "Privacy Act [place] Records" was provided and verbally reviewed with: n/a  Emergency Contact Information Contact Information    Name Relation Home Work Young Daughter   [phone]   [facility] Significant other   [phone]   Mosetta Pigeon   [phone]     Current Medical History  Patient Admitting Diagnosis: CVA  History of Present Illness:[name] is a 59 year old right-handed female with history of hypertension/CKD not on medication.   Presented 04/27/2020 with left hemiparesis and dysarthria.  Systolic blood pressure 423 diastolic 536.  Admission chemistries potassium 2.9 BUN 26 creatinine 1.81 alcohol negative urine drug screen negative.  CT of the head showed hyper attenuating focus in the right thalamus age likely  reflecting sequela of lacunar type infarct likely remote though in the absence of comparison overall age-indeterminate.  Patient did not receive TPA.  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  Severe chronic small vessel ischemia.  CT angiogram of head and neck severe right A2 segment narrowing correlating with acute infarct of the preceding MRI.  50% right cavernous ICA stenosis 55% left para clinoid ICA stenosis.  Echocardiogram with ejection fraction 14% grade 2 diastolic dysfunction.  Presently on aspirin and Plavix for CVA prophylaxis x3 weeks and aspirin alone.  Subcutaneous Lovenox for DVT prophylaxis.  Patient did have a mild bump in creatinine 2.21 and received a fluid bolus.  Permissive hypertension closely monitoring.    Complete NIHSS TOTAL: 11 Glasgow Coma Scale Score: 15  Past Medical History  Past Medical History:  Diagnosis Date  . Arthritis   . Hypertension    not taking medications    Family History  family history includes Cancer in her mother; Heart disease in her brother and father; Hypertension in her father.  Prior Rehab/Hospitalizations:  Has the patient had prior rehab or hospitalizations prior to admission? Yes  Has  the patient had major surgery during 100 days prior to admission? No  Current Medications   Current Facility-Administered Medications:  .  acetaminophen (TYLENOL) tablet 650 mg, 650 mg, Oral, Q4H PRN **OR** acetaminophen (TYLENOL) 160 MG/5ML solution 650 mg, 650 mg, Per Tube, Q4H PRN **OR** acetaminophen (TYLENOL) suppository 650 mg, 650 mg, Rectal, Q4H PRN, Karmen Bongo, MD .  amLODipine (NORVASC) tablet 10 mg, 10 mg, Oral, Daily, Mariel Aloe, MD, 10 mg at 05/03/20 1020 .  aspirin tablet 325 mg, 325 mg, Oral, Daily, 325 mg at 05/03/20 1020 **OR** aspirin suppository 300 mg, 300 mg, Rectal, Daily, Karmen Bongo, MD, 300 mg at 04/29/20 9024 .  atorvastatin (LIPITOR) tablet 40 mg, 40 mg, Oral, Daily, Karmen Bongo, MD, 40 mg at  05/03/20 1020 .  clopidogrel (PLAVIX) tablet 75 mg, 75 mg, Oral, Daily, Biby, Sharon L, NP, 75 mg at 05/03/20 1020 .  enoxaparin (LOVENOX) injection 40 mg, 40 mg, Subcutaneous, Q24H, Karmen Bongo, MD, 40 mg at 05/03/20 1058 .  feeding supplement (BOOST / RESOURCE BREEZE) liquid 1 Container, 1 Container, Oral, TID BM, Caren Griffins, MD, 1 Container at 05/01/20 2010 .  ferrous sulfate tablet 325 mg, 325 mg, Oral, BID WC, Mariel Aloe, MD, 325 mg at 05/03/20 1021 .  hydrALAZINE (APRESOLINE) tablet 75 mg, 75 mg, Oral, Q8H, Mariel Aloe, MD, 75 mg at 05/03/20 0527 .  labetalol (NORMODYNE) injection 10 mg, 10 mg, Intravenous, Q2H PRN, Caren Griffins, MD, 10 mg at 04/29/20 0939 .  multivitamin with minerals tablet 1 tablet, 1 tablet, Oral, Daily, Karmen Bongo, MD, 1 tablet at 05/03/20 1021 .  senna-docusate (Senokot-S) tablet 1 tablet, 1 tablet, Oral, QHS PRN, Karmen Bongo, MD  Patients Current Diet:  Diet Order            Diet Heart Room service appropriate? 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  Independent  Indoor Mobility: Did the patient need assistance with walking from room to room (with or without device)? Independent  Stairs: Did the patient need assistance with internal or  external stairs (with or without device)? Independent  Functional Cognition: Did the patient need help planning regular tasks such as shopping or remembering to take medications? Independent  Home Assistive Devices / Equipment Home Assistive Devices/Equipment: None Home Equipment: None  Prior Device Use: Indicate devices/aids used by the patient prior to current illness, exacerbation or injury? None of the above  Current Functional Level Cognition  Arousal/Alertness: Awake/alert Overall Cognitive Status: Impaired/Different from baseline Current Attention Level: Sustained Orientation Level: Oriented X4 Following Commands: Follows one step commands with increased time, Follows multi-step commands inconsistently Safety/Judgement: Decreased awareness of safety, Decreased awareness of deficits General Comments: patient awake initally but becomes lethargic and has difficulty maintaining eyes open  during session; requires increased time to follow 1 step commands  Attention: Sustained Sustained Attention: Impaired Memory: Impaired Memory Impairment: Retrieval deficit (3/5 recalled without cues, 1/5 with category cue, 1/5 with multiple choice) Awareness: Appears intact (after difficulty performing test items, pt able to state difficulty with attention) Problem Solving: Impaired Problem Solving Impairment: Functional basic (clock drawing impaired numbering 5, 10, 15, etc..) Behaviors: Restless, Impulsive Safety/Judgment: Impaired Comments: pt able to verbalize she needs to have help at home    Extremity Assessment (includes Sensation/Coordination)  Upper Extremity Assessment: Defer to OT evaluation LUE Deficits / Details: flexion synergy patterns noted, inital tone in shoulder but fades with PROM, grossly 0/5 shoulder and elbow, 3-/5 hand ; unable to use functionally LUE Sensation: decreased light touch, decreased proprioception LUE Coordination: decreased fine motor, decreased gross motor   Lower Extremity Assessment: RLE deficits/detail, LLE deficits/detail RLE Deficits / Details: Pt with gross weakness on Rt LE (4-/5 for knee flex/ext testing and ankle dorsi/plantar flexion.  LLE Deficits / Details: Pt with extensor tone in supine and resistance to knee flexion and hip abduction. Hamstring tone noted against PROM sitting EOB, decreased with slow ROM. Pt unable to perform AROM on Lt LE. Grosslly  0/5 throughout Lt LE.  LLE Sensation: decreased proprioception, decreased light touch LLE Coordination: decreased gross motor, decreased fine motor    ADLs  Overall ADL's : Needs assistance/impaired Grooming: Moderate assistance, Bed level Grooming Details (indicate cue type and reason): able to wash face using R hand, but mod asist required for bimanual tasks  Upper Body Bathing: Moderate assistance, Sitting Lower Body Bathing: +2 for physical assistance, +2 for safety/equipment, Sit to/from stand, Total assistance Upper Body Dressing : Maximal assistance, Sitting Lower Body Dressing: Total assistance, +2 for physical assistance, +2 for safety/equipment, Sit to/from stand Lower Body Dressing Details (indicate cue type and reason): total assist for socks, requires +2 sit to stand  Toilet Transfer: Total assistance, +2 for physical assistance, +2 for safety/equipment, Stand-pivot Toilet Transfer Details (indicate cue type and reason): simulated to recliner  Functional mobility during ADLs: Maximal assistance, Total assistance, +2 for physical assistance, +2 for safety/equipment General ADL Comments: pt limited by impaired cognition, L sided hemiparesis, and impaired balance     Mobility  Overal bed mobility: Needs Assistance Bed Mobility: Rolling, Supine to Sit Rolling: Max assist, +2 for physical assistance Supine to sit: Max assist, +2 for physical assistance, HOB elevated, +2 for safety/equipment Sit to sidelying: Total assist, +2 for safety/equipment, +2 for physical  assistance General bed mobility comments: pt able to reach across body with R UE to bed rail, needs reinforcement to leave it there and to pull herself up, initiates movement of R LE to EOB but ultimately requires maxAx2 for LE mangement off bed, trunk to upright and pad scoot to EoB    Transfers  Overall transfer level: Needs assistance Equipment used: 2 person hand held assist Transfers: Sit to/from Stand, Stand Pivot Transfers Sit to Stand: Max assist, +2 physical assistance Stand pivot transfers: Max assist, +2 physical assistance Squat pivot transfers: Total assist, +2 physical assistance General transfer comment: Max assist and multimodal cues to get to midline and initiate sit to stand (from bed and then from recliner); good hip and knee extension weight bearing response when center of mass translated over feet; Max assist to pivot to recliner    Ambulation / Gait / Stairs / Office manager / Balance Dynamic Sitting Balance Sitting balance -  Comments: continues to requires total assist for sitting balance, improved with leaning on R elbow but unable to sustain  Balance Overall balance assessment: Needs assistance Sitting-balance support: No upper extremity supported, Feet supported Sitting balance-Leahy Scale: Zero Sitting balance - Comments: continues to requires total assist for sitting balance, improved with leaning on R elbow but unable to sustain  Postural control: Posterior lean, Left lateral lean Standing balance support: Bilateral upper extremity supported, During functional activity Standing balance-Leahy Scale: Zero    Special needs/care consideration Designated visitor is [name] on day of admit     Previous Home Environment (from acute therapy documentation) Living Arrangements:  (Lives with significant other and 54 year old son)  Lives With: Significant other, Son Marchia Bond, significant other of many years and 27 year old son) Available Help at  Discharge: Family, Available 24 hours/day ([name] during the week and MArvin on weekends) Type of Home: House Home Layout: One level Home Access: Stairs to enter Entrance Stairs-Rails: Right, Left Entrance Stairs-Number of Steps: 2 Bathroom Shower/Tub: Chiropodist: Standard Bathroom Accessibility: Yes How Accessible: Accessible via walker Home Care Services: No Additional Comments: lives with Marchia Bond and her 36 year old son, Daughters very involved are adults  Discharge Living Setting Plans for Discharge Living Setting: Patient's home, Lives with (comment) Marchia Bond and 48 year old son) Type of Home at Discharge: House Discharge Home Layout: One level Discharge Home Access: Stairs to enter Entrance Stairs-Rails: Right, Left Entrance Stairs-Number of Steps: 2 Discharge Bathroom Shower/Tub: Tub/shower unit Discharge Bathroom Toilet: Standard Discharge Bathroom Accessibility: Yes How Accessible: Accessible via walker Does the patient have any problems obtaining your medications?: No  Social/Family/Support Systems Patient Roles: Partner, Parent (employee fulltime at [employer]; very active) Sport and exercise psychologist Information: [name], daughter is first contact Anticipated Caregiver: Marchia Bond and [place] Anticipated Ambulance person Information: see above Ability/Limitations of Caregiver: [name] is a CNA on weekends at [facility] [employer] and can be caregiver during the weekl. Marchia Bond works weekday nights and de and the 2 adult daughters will assist on weekends Caregiver Availability: 24/7 Discharge Plan Discussed with Primary Caregiver: Yes Is Caregiver In Agreement with Plan?: Yes Does Caregiver/Family have Issues with Lodging/Transportation while Pt is in Rehab?: No   Goals Patient/Family Goal for Rehab: Min assist PT, OT, and SLP Expected length of stay: ELOS 17 to 21 days Pt/Family Agrees to Admission and willing to participate: Yes Program Orientation Provided & Reviewed with  Pt/Caregiver Including Roles  & Responsibilities: Yes   Decrease burden of Care through IP rehab admission: n/a  Possible need for SNF placement upon discharge:not anticipated  Patient Condition: This patient's medical and functional status has changed since the consult dated 04/29/2020 in which the Rehabilitation Physician determined and documented that the patient was potentially appropriate for intensive rehabilitative care in an inpatient rehabilitation facility. Issues have been addressed and update has been discussed with Dr. Naaman Plummer and patient now appropriate for inpatient rehabilitation. Will admit to inpatient rehab today.   Preadmission Screen Completed By:  Cleatrice Burke, RN, 05/03/2020 11:13 AM ______________________________________________________________________   Discussed status with Dr. Dagoberto Ligas on 05/03/2020 at 1115 and received approval for admission today.  Admission Coordinator:  Cleatrice Burke, time 0177 Date 05/03/2020

## 2020-05-03 NOTE — Progress Notes (Signed)
Patient addmitted on the unit at 1715, A&O x4, safety plan review with patient, educated on plan of care. Assessment done, medication reviewed with patient. All questions and concerns answered. We continue to monitor

## 2020-05-03 NOTE — Progress Notes (Signed)
Inpatient Rehabilitation Medication Review by a Pharmacist  A complete drug regimen review was completed for this patient to identify any potential clinically significant medication issues.  Clinically significant medication issues were identified:  no  Check AMION for pharmacist assigned to patient if future medication questions/issues arise during this admission.  Pharmacist comments:   Time spent performing this drug regimen review (minutes):  5   Denell Cothern 05/03/2020 4:53 PM

## 2020-05-03 NOTE — TOC Transition Note (Signed)
Transition of Care [facility]) - CM/SW Discharge Note   Patient Details  Name: [name] MRN: [id number] Date of Birth: 07-12-1961  [facility] CM/SW Contact:  Pollie Friar, RN Phone Number: 05/03/2020, 10:12 AM   Clinical Narrative:    Pt is discharging to CIR today. CM signing off.    Final next level of care: IP Rehab Facility Barriers to Discharge: No Barriers Identified   Patient Goals and CMS Choice        Discharge Placement                       Discharge Plan and Services                                     Social Determinants of Health (SDOH) Interventions     Readmission Risk Interventions No flowsheet data found.

## 2020-05-03 NOTE — Discharge Summary (Addendum)
Physician Discharge Summary  [name] ASN:[id number] DOB: [birth year] DOA: 04/26/2020  PCP: Patient, No Pcp Per  Admit date: 04/26/2020 Discharge date: 05/03/2020  Admitted From: Home Disposition: CIR  Recommendations for Outpatient Follow-up:  1. Follow up with Neurology in 5 weeks 2. Watch BMP for improvement of creatinine 3. Work on increased oral intake 4. Two more weeks of Plavix 5. Please follow up on the following pending results: None   Discharge Condition: Stable CODE STATUS: DNR Diet recommendation: Heart healthy   Brief/Interim Summary:  Admission HPI written by Karmen Bongo, MD   Chief Complaint: Elevated BP and slurred speech  HPI: [name] is a 59 y.o. female with medical history significant of HTN presenting with elevated BP and slurred speech.  She reports that on 8/16 (2 days PTA) she started feeling bad (nonspecific).  That night, she started noticing some slurring of her speech.  She has HTN but does not take medication for this issue; she checked her BP and it was elevated.  She has chronic blurred vision and this was unchanged.  She reports chronic shoulder and back pain and current L arm pain.    In the ER she was thought to have hypertensive emergency with SBP up to 267; she was given PO Norvasc and Lisinopril along with IV hydralazine x 1 with significant significant decrease to SBP 160.  She was subsequently noticed to have left facial droop and left sided weakness - which may have been present upon presentation but was not reported.   ED Course:  Carryover, per Dr. Hal Hope:  Urgently called [place] for hypertensive encephalopathy MRI turned out to be acute stroke. At the time of my exam patient became more lethargic and neurology at this time is calling a code stroke and they will call back to see if we need to admit or they will be taking the patient.  Hospital course:  Acute CVA MRI significant for an acute right ACA branch  infarct. Neurology consulted. Transthoracic Echocardiogram significant for no thombus and transcranial doppler significant for negative study. CTA head/neck significant for severe right A1 narrowing. LDL of 82 and hemoglobin A1C of 5.9%. Neurology recommending DAPT x 3 weeks followed by aspirin 81 mg monotherapy. Patient discharged to CIR.  Hypertensive emergency Essential hypertension Patient is not on medication as an outpatient. Patient's blood pressure was liberally controlled in the setting of acute  CVA and is now nearing the end of the window for loose control. Blood pressure better controlled. Recommend continuing amlodipine 10 mg daily and hydralazine 75 mg TID. Over time, goal BP is <130/80. Recommend increasing to to 100 mg TID as next step in the next few days/week  AKI on CKD stage IIIb Likely diagnosis. Creatinine of 1.8 on admission which improved to 1.55 with IV fluids. Stable until bump in creatinine on 8/24. NS bolus given. Recommend repeat BMP on 8/25  Thrombocytopenia Unknown chronicity. Mild. Stable.  Anemia Normocytic. Unknown chronicity. Iron of 18 with ferritin of 68 and TIBC of 267. Possible she has a mix of chronic disease and iron deficiency. Ferrous sulfate supplementation.  Discharge Diagnoses:  Active Problems:   Acute CVA (cerebrovascular accident) ([place])   Accelerated hypertension   Renal dysfunction   Hypokalemia   Hypertensive emergency   Slurred speech   Essential hypertension   Thrombocytopenia (HCC)   Stage 3b chronic kidney disease   Prediabetes   Chronic combined systolic (congestive) and diastolic (congestive) heart failure [facility])    Discharge Instructions  Allergies as of 05/03/2020   No Known Allergies     Medication List    STOP taking these medications   ibuprofen 200 MG tablet Commonly known as: ADVIL     TAKE these medications   amLODipine 10 MG tablet Commonly known as: NORVASC Take 1 tablet (10 mg total) by mouth  daily. Start taking on: May 04, 2020   aspirin 81 MG chewable tablet Commonly known as: Aspirin Childrens Chew 1 tablet (81 mg total) by mouth daily. Start taking on: May 04, 2020   atorvastatin 40 MG tablet Commonly known as: LIPITOR Take 1 tablet (40 mg total) by mouth daily. Start taking on: May 04, 2020   clopidogrel 75 MG tablet Commonly known as: PLAVIX Take 1 tablet (75 mg total) by mouth daily for 14 days. Start taking on: May 04, 2020   feeding supplement (BOOST BREEZE) Liqd Take 1 Bottle by mouth 3 (three) times daily between meals.   ferrous sulfate 325 (65 FE) MG tablet Take 1 tablet (325 mg total) by mouth 2 (two) times daily with a meal.   hydrALAZINE 25 MG tablet Commonly known as: APRESOLINE Take 3 tablets (75 mg total) by mouth every 8 (eight) hours.   multivitamin with minerals Tabs tablet Take 1 tablet by mouth daily. Start taking on: May 04, 2020       Follow-up Information    Garvin Fila, MD. Schedule an appointment as soon as possible for a visit in 5 week(s).   Specialties: Neurology, Radiology Why: Stroke follow-up Contact information: [address] [place] [address] [phone]              No Known Allergies  Consultations:  Neurology  PM&R   Procedures/Studies: CT ANGIO HEAD W OR WO CONTRAST  Result Date: 04/27/2020 CLINICAL DATA:  Code stroke for new left-sided weakness EXAM: CT ANGIOGRAPHY HEAD AND NECK TECHNIQUE: Multidetector CT imaging of the head and neck was performed using the standard protocol during bolus administration of intravenous contrast. Multiplanar CT image reconstructions and MIPs were obtained to evaluate the vascular anatomy. Carotid stenosis measurements (when applicable) are obtained utilizing NASCET criteria, using the distal internal carotid diameter as the denominator. CONTRAST:  33mL OMNIPAQUE IOHEXOL 350 MG/ML SOLN COMPARISON:  Brain MRI from yesterday FINDINGS: CT  HEAD FINDINGS Brain: By MRI there is acute infarct elongated along the body of the right corpus callosum. No hemorrhage is superimposed. Severe chronic small vessel ischemia in the cerebral white matter with chronic (by MRI) infarcts in the deep gray nuclei, especially large in the right thalamus. Remote insult to the brachium pontis on the right by MRI. Vascular: See below Skull: Negative Sinuses: Retention cyst in the left maxillary sinus. Orbits: Negative Review of the MIP images confirms the above findings CTA NECK FINDINGS Aortic arch: Mild atheromatous plaque. Four vessel branching. No acute finding. Right carotid system: Minimal mixed density plaque at the ICA bulb. No ulceration or flow limiting stenosis. Left carotid system: Probable mild atheromatous wall thickening of the common carotid and proximal ICA. No ulceration or beading. Vertebral arteries: The left vertebral artery arises from the arch and is widely patent to the dura. No right-sided vertebral stenosis or beading is seen either. The subclavians are symmetrically and widely patent. Skeleton: Poor dentition.  No acute or aggressive finding Other neck: Negative Upper chest: Clear Review of the MIP images confirms the above findings CTA HEAD FINDINGS Anterior circulation: Atheromatous plaque along both carotid siphons. On the left  there is 55% narrowing at the paraclinoid segment based on sagittal reformats. On axial slices right cavernous ICA narrowing measures 50%. Left ICA is larger than the right due to hypoplasia of the right A1 segment. Severe right A2 segment narrowing with a central flow gap, correlating with the acute infarct. No proximal occlusion. Atheromatous type irregularity of bilateral MCA branches which is generalized. Posterior circulation: Symmetric vertebral arteries. The vertebral and basilar arteries are smooth and widely patent. Bilateral P2 moderate or advanced narrowing. Additional notable narrowing at the right PCA  bifurcation. Negative for aneurysm. Venous sinuses: Diffusely patent Anatomic variants: As above Review of the MIP images confirms the above findings IMPRESSION: 1. Severe right A2 segment narrowing correlating with the acute infarct by the preceding MRI. 2. 55% left paraclinoid ICA stenosis. 50% right cavernous ICA stenosis 3. Diffuse atheromatous change to medium size vessels. Notable bilateral P2 segment stenoses. 4. Mild atherosclerosis in the neck without stenosis or visible embolic source. Electronically Signed   By: Monte Fantasia M.D.   On: 04/27/2020 06:54   CT HEAD WO CONTRAST  Result Date: 05/02/2020 CLINICAL DATA:  Status post right ACA infarct EXAM: CT HEAD WITHOUT CONTRAST TECHNIQUE: Contiguous axial images were obtained from the base of the skull through the vertex without intravenous contrast. COMPARISON:  04/27/2020 FINDINGS: Brain: Geographic area of decreased attenuation is noted in the distribution of the distal aspect of the right anterior cerebral artery similar to that seen on recent MRI consistent with subacute infarct. Basal ganglia calcifications are seen. Mild chronic white matter ischemic changes are seen as well. Stable right thalamic lacunar infarct is noted as well. No acute hemorrhage or new focal infarct is seen. Vascular: No hyperdense vessel or unexpected calcification. Skull: Normal. Negative for fracture or focal lesion. Sinuses/Orbits: Mucosal retention cyst is noted in the left maxillary antrum stable from the prior study. Other: None. IMPRESSION: Geographic area of decreased attenuation in the distribution of the right anterior cerebral artery consistent with the ischemia seen on recent MRI. No new focal abnormality is seen. Electronically Signed   By: Inez Catalina M.D.   On: 05/02/2020 17:56   CT Head Wo Contrast  Result Date: 04/27/2020 CLINICAL DATA:  Blurred vision and elevated blood pressure. EXAM: CT HEAD WITHOUT CONTRAST TECHNIQUE: Contiguous axial images were  obtained from the base of the skull through the vertex without intravenous contrast. COMPARISON:  None. FINDINGS: Brain: Hypoattenuating focus in the right thalamus age likely reflect sequela of a lacunar-type infarct, likely remote though in the absence of comparison is overall age indeterminate. No convincing sites of acute infarction, hemorrhage, hydrocephalus, extra-axial collection or mass lesion/mass effect. Symmetric prominence of the ventricles, cisterns and sulci compatible with parenchymal volume loss. Patchy areas of white matter hypoattenuation are most compatible with chronic microvascular angiopathy. Senescent mineralization of the basal ganglia. Vascular: Atherosclerotic calcification of the carotid siphons. No hyperdense vessel. Skull: No calvarial fracture or suspicious osseous lesion. No scalp swelling or hematoma. Sinuses/Orbits: Likely retention cyst in left maxillary sinus. Remaining paranasal sinuses are predominantly clear. Debris in bilateral external auditory canals. Included orbital structures are unremarkable. 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 IMPRESSION: 1. Hypoattenuating focus in the right thalamus age likely reflect sequela of a lacunar-type infarct, likely remote though in the absence of comparison is overall age indeterminate. If there is concern for acute ischemia, MRI could be obtained for further evaluation. 2. Chronic microvascular angiopathy and parenchymal volume loss. 3. Debris in bilateral external auditory canals, correlate  for cerumen impaction. 4. 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 Electronically Signed   By: Lovena Le M.D.   On: 04/27/2020 02:21   CT ANGIO NECK W OR WO CONTRAST  Result Date: 04/27/2020 CLINICAL DATA:  Code stroke for new left-sided weakness EXAM: CT ANGIOGRAPHY HEAD AND NECK TECHNIQUE: Multidetector CT imaging of the head and neck was performed using the standard protocol during bolus administration of intravenous  contrast. Multiplanar CT image reconstructions and MIPs were obtained to evaluate the vascular anatomy. Carotid stenosis measurements (when applicable) are obtained utilizing NASCET criteria, using the distal internal carotid diameter as the denominator. CONTRAST:  5mL OMNIPAQUE IOHEXOL 350 MG/ML SOLN COMPARISON:  Brain MRI from yesterday FINDINGS: CT HEAD FINDINGS Brain: By MRI there is acute infarct elongated along the body of the right corpus callosum. No hemorrhage is superimposed. Severe chronic small vessel ischemia in the cerebral white matter with chronic (by MRI) infarcts in the deep gray nuclei, especially large in the right thalamus. Remote insult to the brachium pontis on the right by MRI. Vascular: See below Skull: Negative Sinuses: Retention cyst in the left maxillary sinus. Orbits: Negative Review of the MIP images confirms the above findings CTA NECK FINDINGS Aortic arch: Mild atheromatous plaque. Four vessel branching. No acute finding. Right carotid system: Minimal mixed density plaque at the ICA bulb. No ulceration or flow limiting stenosis. Left carotid system: Probable mild atheromatous wall thickening of the common carotid and proximal ICA. No ulceration or beading. Vertebral arteries: The left vertebral artery arises from the arch and is widely patent to the dura. No right-sided vertebral stenosis or beading is seen either. The subclavians are symmetrically and widely patent. Skeleton: Poor dentition.  No acute or aggressive finding Other neck: Negative Upper chest: Clear Review of the MIP images confirms the above findings CTA HEAD FINDINGS Anterior circulation: Atheromatous plaque along both carotid siphons. On the left there is 55% narrowing at the paraclinoid segment based on sagittal reformats. On axial slices right cavernous ICA narrowing measures 50%. Left ICA is larger than the right due to hypoplasia of the right A1 segment. Severe right A2 segment narrowing with a central flow gap,  correlating with the acute infarct. No proximal occlusion. Atheromatous type irregularity of bilateral MCA branches which is generalized. Posterior circulation: Symmetric vertebral arteries. The vertebral and basilar arteries are smooth and widely patent. Bilateral P2 moderate or advanced narrowing. Additional notable narrowing at the right PCA bifurcation. Negative for aneurysm. Venous sinuses: Diffusely patent Anatomic variants: As above Review of the MIP images confirms the above findings IMPRESSION: 1. Severe right A2 segment narrowing correlating with the acute infarct by the preceding MRI. 2. 55% left paraclinoid ICA stenosis. 50% right cavernous ICA stenosis 3. Diffuse atheromatous change to medium size vessels. Notable bilateral P2 segment stenoses. 4. Mild atherosclerosis in the neck without stenosis or visible embolic source. Electronically Signed   By: Monte Fantasia M.D.   On: 04/27/2020 06:54   MR BRAIN WO CONTRAST  Result Date: 04/27/2020 CLINICAL DATA:  High blood pressure and slurred speech. EXAM: MRI HEAD WITHOUT CONTRAST TECHNIQUE: Multiplanar, multiecho pulse sequences of the brain and surrounding structures were obtained without intravenous contrast. COMPARISON:  Head CT from earlier today FINDINGS: Brain: Band of restricted diffusion along the right corpus callosum body with minimal extension into the adjacent white matter and high right frontal cortex, ACA branch distribution. There is a background of confluent FLAIR hyperintensity in the cerebral white matter without mass effect. Chronic lacunes  and chronic microhemorrhages seen at the deep gray nuclei. Gradient was not performed but may show even more hypertensive type hemorrhages. Indistinct T2 hyperintensity in the pons and asymmetric T2 hyperintensity in the right middle cerebellar peduncle also attributed to chronic ischemia. No suspected primary demyelinating process. No acute hemorrhage, hydrocephalus, or masslike finding.  Vascular: Preserved flow voids Skull and upper cervical spine: Normal marrow signal Sinuses/Orbits: Retention cyst in the left maxillary sinus. Other: Truncated study due to patient discomfort IMPRESSION: 1. Acute right ACA branch infarct. 2. Severe chronic small vessel ischemia. 3. Truncated study due to patient discomfort. Electronically Signed   By: Monte Fantasia M.D.   On: 04/27/2020 05:44   VAS [place] TRANSCRANIAL DOPPLER W BUBBLES  Result Date: 04/28/2020  Transcranial Doppler with Bubble Indications: Stroke. Performing Technologist: Abram Sander RVS  Examination Guidelines: A complete evaluation includes B-mode imaging, spectral Doppler, color Doppler, and power Doppler as needed of all accessible portions of each vessel. Bilateral testing is considered an integral part of a complete examination. Limited examinations for reoccurring indications may be performed as noted.  Summary: No HITS at rest or during Valsalva. Negative transcranial Doppler Bubble study with no evidence of right to left intracardiac communication.  A vascular evaluation was performed. The right middle cerebral artery was studied. An IV was inserted into the patient's right hand. Verbal informed consent was obtained.  Negative TCD Bubble study *See table(s) above for TCD measurements and observations.  Diagnosing physician: Antony Contras MD Electronically signed by Antony Contras MD on 04/28/2020 at 12:52:40 PM.    Final    ECHOCARDIOGRAM COMPLETE  Result Date: 04/27/2020    ECHOCARDIOGRAM REPORT   Patient Name:   [facility] Babich Date of Exam: 04/27/2020 Medical Rec #:  [id number]     Height:       64.0 in Accession #:    [id number]    Weight:       160.0 lb Date of Birth:  Aug 02, 1961     BSA:          1.779 m Patient Age:    57 years      BP:           155/83 mmHg Patient Gender: F             HR:           88 bpm. Exam Location:  Inpatient Procedure: 2D Echo Indications:    stroke 434.91  History:        Patient has no prior history of  Echocardiogram examinations.                 Risk Factors:Hypertension.  Sonographer:    Jannett Celestine RDCS (AE) Referring Phys: [id number] JENNIFER YATES  Sonographer Comments: restricted mobility, specifically patient's left side was lacking mobility. patient unable to turn on left side as a result IMPRESSIONS  1. Left ventricular ejection fraction, by estimation, is 60 to 65%. The left ventricle has normal function. The left ventricle has no regional wall motion abnormalities. There is mild left ventricular hypertrophy. Left ventricular diastolic parameters are consistent with Grade II diastolic dysfunction (pseudonormalization). Elevated left ventricular end-diastolic pressure.  2. Right ventricular systolic function is normal. The right ventricular size is normal.  3. The mitral valve is normal in structure. Trivial mitral valve regurgitation. No evidence of mitral stenosis.  4. The aortic valve is normal in structure. Aortic valve regurgitation is not visualized. Mild aortic valve sclerosis is present, with no evidence of aortic  valve stenosis.  5. The inferior vena cava is normal in size with greater than 50% respiratory variability, suggesting right atrial pressure of 3 mmHg. FINDINGS  Left Ventricle: Left ventricular ejection fraction, by estimation, is 60 to 65%. The left ventricle has normal function. The left ventricle has no regional wall motion abnormalities. The left ventricular internal cavity size was normal in size. There is  mild left ventricular hypertrophy. Left ventricular diastolic parameters are consistent with Grade II diastolic dysfunction (pseudonormalization). Elevated left ventricular end-diastolic pressure. Right Ventricle: The right ventricular size is normal. No increase in right ventricular wall thickness. Right ventricular systolic function is normal. Left Atrium: Left atrial size was normal in size. Right Atrium: Right atrial size was normal in size. Pericardium: There is no evidence of  pericardial effusion. Mitral Valve: The mitral valve is normal in structure. There is mild thickening of the mitral valve leaflet(s). Normal mobility of the mitral valve leaflets. Trivial mitral valve regurgitation. No evidence of mitral valve stenosis. Tricuspid Valve: The tricuspid valve is normal in structure. Tricuspid valve regurgitation is not demonstrated. No evidence of tricuspid stenosis. Aortic Valve: The aortic valve is normal in structure. Aortic valve regurgitation is not visualized. Mild aortic valve sclerosis is present, with no evidence of aortic valve stenosis. Pulmonic Valve: The pulmonic valve was normal in structure. Pulmonic valve regurgitation is not visualized. No evidence of pulmonic stenosis. Aorta: The aortic root is normal in size and structure. Venous: The inferior vena cava is normal in size with greater than 50% respiratory variability, suggesting right atrial pressure of 3 mmHg. IAS/Shunts: No atrial level shunt detected by color flow Doppler.  LEFT VENTRICLE PLAX 2D LVIDd:         4.80 cm  Diastology LVIDs:         3.60 cm  LV e' lateral:   4.79 cm/s LV PW:         1.30 cm  LV E/e' lateral: 20.6 LV IVS:        1.30 cm  LV e' medial:    6.64 cm/s LVOT diam:     1.90 cm  LV E/e' medial:  14.8 LV SV:         45 LV SV Index:   25 LVOT Area:     2.84 cm  RIGHT VENTRICLE TAPSE (M-mode): 1.6 cm LEFT ATRIUM           Index LA diam:      3.70 cm 2.08 cm/m LA Vol (A2C): 71.5 ml 40.18 ml/m  AORTIC VALVE LVOT Vmax:   112.00 cm/s LVOT Vmean:  69.400 cm/s LVOT VTI:    0.158 m  AORTA Ao Root diam: 3.00 cm MITRAL VALVE MV Area (PHT): 3.37 cm     SHUNTS MV Decel Time: 225 msec     Systemic VTI:  0.16 m MV E velocity: 98.50 cm/s   Systemic Diam: 1.90 cm MV A velocity: 106.00 cm/s MV E/A ratio:  0.93 Jenkins Rouge MD Electronically signed by Jenkins Rouge MD Signature Date/Time: 04/27/2020/1:33:36 PM    Final       TRANSTHORACIC ECHOCARDIOGRAM (04/27/2020) IMPRESSIONS    1. Left ventricular  ejection fraction, by estimation, is 60 to 65%. The  left ventricle has normal function. The left ventricle has no regional  wall motion abnormalities. There is mild left ventricular hypertrophy.  Left ventricular diastolic parameters  are consistent with Grade II diastolic dysfunction (pseudonormalization).  Elevated left ventricular end-diastolic pressure.  2. Right ventricular systolic function is normal.  The right ventricular  size is normal.  3. The mitral valve is normal in structure. Trivial mitral valve  regurgitation. No evidence of mitral stenosis.  4. The aortic valve is normal in structure. Aortic valve regurgitation is  not visualized. Mild aortic valve sclerosis is present, with no evidence  of aortic valve stenosis.  5. The inferior vena cava is normal in size with greater than 50%  respiratory variability, suggesting right atrial pressure of 3 mmHg.   Subjective: No issues overnight. More alert today.  Discharge Exam: Vitals:   05/03/20 0336 05/03/20 0752  BP: (!) 168/78 (!) 154/69  Pulse: (!) 108 (!) 102  Resp: 17 18  Temp: 98.2 F (36.8 C) 98.6 F (37 C)  SpO2: 100% 99%   Vitals:   05/02/20 1950 05/02/20 2358 05/03/20 0336 05/03/20 0752  BP: (!) 144/80 (!) 156/75 (!) 168/78 (!) 154/69  Pulse: (!) 110 (!) 105 (!) 108 (!) 102  Resp: 19 18 17 18   Temp: 98.3 F (36.8 C) 98.3 F (36.8 C) 98.2 F (36.8 C) 98.6 F (37 C)  TempSrc: Oral Oral Oral Oral  SpO2: 97% 97% 100% 99%  Weight:      Height:        General: Pt is alert, awake, not in acute distress Cardiovascular: RRR, S1/S2 +, no rubs, no gallops Respiratory: CTA bilaterally, no wheezing, no rhonchi Abdominal: Soft, NT, ND, bowel sounds + Extremities: no edema, no cyanosis Neuro: 0/5 strength of left upper/lower extremities. Dysarthria. Alert and oriented x4    The results of significant diagnostics from this hospitalization (including imaging, microbiology, ancillary and laboratory) are  listed below for reference.     Microbiology: Recent Results (from the past 240 hour(s))  SARS Coronavirus 2 by RT PCR (hospital order, performed in [facility] hospital lab) Nasopharyngeal Nasopharyngeal Swab     Status: None   Collection Time: 04/27/20  3:51 AM   Specimen: Nasopharyngeal Swab  Result Value Ref Range Status   SARS Coronavirus 2 NEGATIVE NEGATIVE Final    Comment: (NOTE) SARS-CoV-2 target nucleic acids are NOT DETECTED.  The SARS-CoV-2 RNA is generally detectable in upper and lower respiratory specimens during the acute phase of infection. The lowest concentration of SARS-CoV-2 viral copies this assay can detect is 250 copies / mL. A negative result does not preclude SARS-CoV-2 infection and should not be used as the sole basis for treatment or other patient management decisions.  A negative result may occur with improper specimen collection / handling, submission of specimen other than nasopharyngeal swab, presence of viral mutation(s) within the areas targeted by this assay, and inadequate number of viral copies (<250 copies / mL). A negative result must be combined with clinical observations, patient history, and epidemiological information.  Fact Sheet for Patients:   StrictlyIdeas.no  Fact Sheet for Healthcare Providers: BankingDealers.co.za  This test is not yet approved or  cleared by the [place] FDA and has been authorized for detection and/or diagnosis of SARS-CoV-2 by FDA under an Emergency Use Authorization (EUA).  This EUA will remain in effect (meaning this test can be used) for the duration of the COVID-19 declaration under Section 564(b)(1) of the Act, 21 U.S.C. section 360bbb-3(b)(1), unless the authorization is terminated or revoked sooner.  Performed at [facility], [place] [address], [place], [place] [address]      Labs: BNP (last 3 results) No results for input(s): BNP in the  last 8760 hours. Basic Metabolic Panel: Recent Labs  Lab  04/26/20 2135 04/26/20 2135 04/27/20 0246 04/28/20 0936 04/29/20 0207 04/30/20 0256 05/03/20 0309  NA 141   < > 142 140 140 141 139  K 2.9*   < > 3.1* 3.6 3.6 3.9 3.7  CL 105   < > 103 109 111 109 104  CO2 24  --   --  21* 21* 25 23  GLUCOSE 119*   < > 113* 94 90 111* 102*  BUN 26*   < > 28* 16 18 18  44*  CREATININE 1.81*   < > 1.80* 1.53* 1.59* 1.55* 2.21*  CALCIUM 9.6  --   --  9.3 9.1 9.2 9.5   < > = values in this interval not displayed.   Liver Function Tests: No results for input(s): AST, ALT, ALKPHOS, BILITOT, PROT, ALBUMIN in the last 168 hours. No results for input(s): LIPASE, AMYLASE in the last 168 hours. No results for input(s): AMMONIA in the last 168 hours. CBC: Recent Labs  Lab 04/26/20 2135 04/27/20 0237 04/27/20 0246 04/29/20 0207 05/02/20 0213 05/03/20 0309  WBC 9.9  --   --  9.4 15.2* 14.1*  NEUTROABS  --  7.2  --   --   --   --   HGB 12.5  --  13.3 10.7* 11.4* 11.1*  HCT 39.0  --  39.0 33.7* 35.1* 34.4*  MCV 89.0  --   --  88.9 86.5 87.8  PLT 143*  --   --  120* 168 147*   Cardiac Enzymes: No results for input(s): CKTOTAL, CKMB, CKMBINDEX, TROPONINI in the last 168 hours. BNP: Invalid input(s): POCBNP CBG: Recent Labs  Lab 05/02/20 1623  GLUCAP 135*   D-Dimer No results for input(s): DDIMER in the last 72 hours. Hgb A1c No results for input(s): HGBA1C in the last 72 hours. Lipid Profile No results for input(s): CHOL, HDL, LDLCALC, TRIG, CHOLHDL, LDLDIRECT in the last 72 hours. Thyroid function studies No results for input(s): TSH, T4TOTAL, T3FREE, THYROIDAB in the last 72 hours.  Invalid input(s): FREET3 Anemia work up Recent Labs    05/02/20 0213  FERRITIN 68  TIBC 267  IRON 18*   Urinalysis    Component Value Date/Time   COLORURINE YELLOW 05/02/2020 2005   APPEARANCEUR HAZY (A) 05/02/2020 2005   LABSPEC 1.012 05/02/2020 2005   PHURINE 5.0 05/02/2020 2005    GLUCOSEU NEGATIVE 05/02/2020 2005   HGBUR NEGATIVE 05/02/2020 2005   BILIRUBINUR NEGATIVE 05/02/2020 2005   [place] NEGATIVE 05/02/2020 2005   PROTEINUR NEGATIVE 05/02/2020 2005   NITRITE NEGATIVE 05/02/2020 2005   LEUKOCYTESUR NEGATIVE 05/02/2020 2005   Sepsis Labs Invalid input(s): PROCALCITONIN,  WBC,  LACTICIDVEN Microbiology Recent Results (from the past 240 hour(s))  SARS Coronavirus 2 by RT PCR (hospital order, performed in [place] hospital lab) Nasopharyngeal Nasopharyngeal Swab     Status: None   Collection Time: 04/27/20  3:51 AM   Specimen: Nasopharyngeal Swab  Result Value Ref Range Status   SARS Coronavirus 2 NEGATIVE NEGATIVE Final    Comment: (NOTE) SARS-CoV-2 target nucleic acids are NOT DETECTED.  The SARS-CoV-2 RNA is generally detectable in upper and lower respiratory specimens during the acute phase of infection. The lowest concentration of SARS-CoV-2 viral copies this assay can detect is 250 copies / mL. A negative result does not preclude SARS-CoV-2 infection and should not be used as the sole basis for treatment or other patient management decisions.  A negative result may occur with improper specimen collection / handling, submission of specimen  other than nasopharyngeal swab, presence of viral mutation(s) within the areas targeted by this assay, and inadequate number of viral copies (<250 copies / mL). A negative result must be combined with clinical observations, patient history, and epidemiological information.  Fact Sheet for Patients:   StrictlyIdeas.no  Fact Sheet for Healthcare Providers: BankingDealers.co.za  This test is not yet approved or  cleared by the [place] FDA and has been authorized for detection and/or diagnosis of SARS-CoV-2 by FDA under an Emergency Use Authorization (EUA).  This EUA will remain in effect (meaning this test can be used) for the duration of the COVID-19  declaration under Section 564(b)(1) of the Act, 21 U.S.C. section 360bbb-3(b)(1), unless the authorization is terminated or revoked sooner.  Performed at [facility], [place] [address] [place], [place] [address]      Time coordinating discharge: 35 minutes  SIGNED:   Cordelia Poche, MD [facility] 05/03/2020, 10:58 AM

## 2020-05-03 NOTE — Progress Notes (Signed)
Jamse Arn, MD  Physician  Physical Medicine and Rehabilitation  Consult Note      Signed  Date of Service:  04/29/2020  6:34 AM      Related encounter: ED to Hosp-Admission (Discharged) from 04/26/2020 in [facility]      Signed      Expand All Collapse All  Show:Clear all [x] Manual[x] Template[] Copied  Added by: [x] Angiulli, Lavon Paganini, PA-C[x] Jamse Arn, MD  [] Hover for details          Physical Medicine and Rehabilitation Consult Reason for Consult: Left hemiparesis and slurred speech Referring Physician: [facility]   HPI: [name] is a 59 y.o. right-handed female with history of hypertension not on medication.  History taken from chart review and patient.  Patient lives with her 19 year old son and 2 adult daughters.  1 level home 2 steps to entry.  Independent prior to admission working at [employer].  She presented on 04/27/2020 with left hemiparesis and dysarthria.  Systolic blood pressure 109 and diastolic 323.  Admission chemistries potassium 2.9, BUN 26 creatinine 1.81, alcohol negative, urine drug screen negative.  CT showed hypoattenuating focus in the right thalamus age likely reflecting sequela of lacuna type infarct likely remote though in the absence of comparison overall age-indeterminate.  Patient did not receive TPA.  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  Severe chronic small vessel ischemia.  CT angiogram of head and neck severe right A2 segment narrowing correlating with acute infarct of the preceding MRI.  50% right cavernous ICA stenosis 55% left paraclinoid ICA stenosis.  Echocardiogram with ejection fraction of 55%, grade 2 diastolic dysfunction.  Presently on aspirin and Plavix for CVA prophylaxis x3 weeks then aspirin alone.  Subcutaneous Lovenox for DVT prophylaxis.  Permissive hypertension close monitoring.  Therapy evaluations completed with recommendations of physical medicine rehab consult.   Review of Systems    Constitutional: Positive for malaise/fatigue. Negative for chills and fever.  HENT: Negative for hearing loss.   Eyes: Negative for blurred vision and double vision.  Respiratory: Negative for cough and shortness of breath.   Cardiovascular: Negative for chest pain and leg swelling.  Gastrointestinal: Positive for constipation. Negative for heartburn, nausea and vomiting.  Genitourinary: Negative for dysuria and hematuria.  Musculoskeletal: Positive for myalgias.  Skin: Negative for rash.  Neurological: Positive for dizziness, speech change, focal weakness, weakness and headaches.  All other systems reviewed and are negative.       Past Medical History:  Diagnosis Date  . Arthritis    . Hypertension      not taking medications         Past Surgical History:  Procedure Laterality Date  . CESAREAN SECTION             Family History  Problem Relation Age of Onset  . Cancer Mother    . Hypertension Father    . Heart disease Father    . Heart disease Brother    . Stroke Neg Hx      Social History:  reports that she has never smoked. She has never used smokeless tobacco. She reports that she does not drink alcohol and does not use drugs. Allergies: No Known Allergies       Medications Prior to Admission  Medication Sig Dispense Refill  . ibuprofen (ADVIL) 200 MG tablet Take 400 mg by mouth every 6 (six) hours as needed for headache or mild pain.          Home:  Home Living Family/patient expects to be discharged to:: Private residence Living Arrangements: Children Available Help at Discharge: Family Type of Home: House Home Access: Stairs to enter Technical brewer of Steps: 2 Entrance Stairs-Rails: Right, Left Home Layout: One level Bathroom Shower/Tub: Chiropodist: Standard Home Equipment: None Additional Comments: pt lives with 25 y.o. son and has 2 adult daughters who are able to assist as well. Pt's daughter [name] works weekends and is  available during weekdays for support/assist.  Lives With: Son  Functional History: Prior Function Level of Independence: Independent Comments: independent and working at [employer], driving  Functional Status:  Mobility: Bed Mobility Overal bed mobility: Needs Assistance Bed Mobility: Supine to Sit, Sit to Sidelying, Rolling Rolling: Mod assist, +2 for physical assistance, +2 for safety/equipment Supine to sit: Max assist, +2 for physical assistance, HOB elevated, +2 for safety/equipment Sit to sidelying: Total assist, +2 for safety/equipment, +2 for physical assistance General bed mobility comments: pt able to initiate Rt UE reaching to Lt bed rail and initated Rt LE movement to EOB, Max assist for Lt LE movement and max +2 to complete roll and sit up EOB. Total assist to return to supine and reposition in bed. Transfers Overall transfer level: Needs assistance Equipment used: 2 person hand held assist Transfers: Sit to/from Stand Sit to Stand: Total assist, +2 physical assistance, +2 safety/equipment, From elevated surface General transfer comment: total assist +2 to power up and steady from EOB, full reliance on external support. pt able to rise ~75% to full stand.   ADL: ADL Overall ADL's : Needs assistance/impaired Grooming: Moderate assistance, Bed level Grooming Details (indicate cue type and reason): able to wash face using R hand, but mod asist required for bimanual tasks  Upper Body Bathing: Moderate assistance, Sitting Lower Body Bathing: +2 for physical assistance, +2 for safety/equipment, Sit to/from stand, Total assistance Upper Body Dressing : Maximal assistance, Sitting Lower Body Dressing: Total assistance, +2 for physical assistance, +2 for safety/equipment, Sit to/from stand Toilet Transfer Details (indicate cue type and reason): deferred due to safety Functional mobility during ADLs: Maximal assistance, +2 for physical assistance, +2 for safety/equipment, Total  assistance General ADL Comments: pt limited by impaired cognition, L sided hemiparesis, and impaired balance    Cognition: Cognition Overall Cognitive Status: Impaired/Different from baseline Arousal/Alertness: Awake/alert Orientation Level: Oriented X4 Attention: Sustained Sustained Attention: Impaired Memory: Impaired Memory Impairment: Retrieval deficit (3/5 recalled without cues, 1/5 with category cue, 1/5 with multiple choice) Awareness: Appears intact (after difficulty performing test items, pt able to state difficulty with attention) Problem Solving: Impaired Problem Solving Impairment: Functional basic (clock drawing impaired numbering 5, 10, 15, etc..) Behaviors: Restless, Impulsive Safety/Judgment: Impaired Comments: pt able to verbalize she needs to have help at home Cognition Arousal/Alertness: Lethargic (improves with engagement ) Behavior During Therapy: Flat affect (labile at times ) Overall Cognitive Status: Impaired/Different from baseline Area of Impairment: Attention, Memory, Safety/judgement, Following commands, Awareness, Problem solving Current Attention Level: Sustained Memory: Decreased short-term memory, Decreased recall of precautions Following Commands: Follows one step commands with increased time, Follows one step commands consistently, Follows multi-step commands inconsistently Safety/Judgement: Decreased awareness of safety, Decreased awareness of deficits Awareness: Emergent Problem Solving: Slow processing, Decreased initiation, Difficulty sequencing, Requires verbal cues, Requires tactile cues General Comments: patient oriented, follows simple commands with increased time but inconsistent with mulitple step commands, decreased awareness to deficits but becomes aware of inability to complete tasks with testing. Pt tearful at EOS when daughter walks in and  states "I can't do anything for myself".   Blood pressure (!) 213/117, pulse 80, temperature 98.6  F (37 C), temperature source Oral, resp. rate 20, height 5\' 4"  (1.626 m), weight 72.6 kg, SpO2 100 %. Physical Exam Constitutional:      General: She is not in acute distress.    Appearance: She is normal weight.  HENT:     Head: Normocephalic and atraumatic.     Right Ear: External ear normal.     Left Ear: External ear normal.     Nose: Nose normal.  Eyes:     General:        Right eye: No discharge.        Left eye: No discharge.     Extraocular Movements: Extraocular movements intact.  Cardiovascular:     Rate and Rhythm: Normal rate and regular rhythm.  Pulmonary:     Effort: Pulmonary effort is normal. No respiratory distress.     Breath sounds: Normal breath sounds. No stridor.  Abdominal:     General: Abdomen is flat. Bowel sounds are normal. There is no distension.  Musculoskeletal:     Cervical back: Normal range of motion and neck supple.     Comments: No edema or tenderness in extremities  Skin:    General: Skin is warm and dry.  Neurological:     Mental Status: She is alert.     Comments: Follows commands. Alert Dysarthria Motor: RUE/RLE: 5/5 proximal distal LUE: Shoulder abduction, elbow flexion/extension 0/5, handgrip 1+/5 Left lower extremity: Hip flexion, knee extension 2+/5, ankle dorsiflexion 3+/5 with apraxia Sensation intact light touch  Psychiatric:        Mood and Affect: Mood normal.        Behavior: Behavior normal.        Lab Results Last 24 Hours  Results for orders placed or performed during the hospital encounter of 04/26/20 (from the past 24 hour(s))  Basic metabolic panel     Status: Abnormal    Collection Time: 04/29/20  2:07 AM  Result Value Ref Range    Sodium 140 135 - 145 mmol/L    Potassium 3.6 3.5 - 5.1 mmol/L    Chloride 111 98 - 111 mmol/L    CO2 21 (L) 22 - 32 mmol/L    Glucose, Bld 90 70 - 99 mg/dL    BUN 18 6 - 20 mg/dL    Creatinine, Ser 1.59 (H) 0.44 - 1.00 mg/dL    Calcium 9.1 8.9 - 10.3 mg/dL    GFR calc non Af  Amer 35 (L) >60 mL/min    GFR calc Af Amer 41 (L) >60 mL/min    Anion gap 8 5 - 15  CBC     Status: Abnormal    Collection Time: 04/29/20  2:07 AM  Result Value Ref Range    WBC 9.4 4.0 - 10.5 K/uL    RBC 3.79 (L) 3.87 - 5.11 MIL/uL    Hemoglobin 10.7 (L) 12.0 - 15.0 g/dL    HCT 33.7 (L) 36 - 46 %    MCV 88.9 80.0 - 100.0 fL    MCH 28.2 26.0 - 34.0 pg    MCHC 31.8 30.0 - 36.0 g/dL    RDW 14.6 11.5 - 15.5 %    Platelets 120 (L) 150 - 400 K/uL    nRBC 0.0 0.0 - 0.2 %       Imaging Results (Last 48 hours)  VAS [place] TRANSCRANIAL DOPPLER W BUBBLES  Result Date: 04/28/2020  Transcranial Doppler with Bubble Indications: Stroke. Performing Technologist: Abram Sander RVS  Examination Guidelines: A complete evaluation includes B-mode imaging, spectral Doppler, color Doppler, and power Doppler as needed of all accessible portions of each vessel. Bilateral testing is considered an integral part of a complete examination. Limited examinations for reoccurring indications may be performed as noted.  Summary: No HITS at rest or during Valsalva. Negative transcranial Doppler Bubble study with no evidence of right to left intracardiac communication.  A vascular evaluation was performed. The right middle cerebral artery was studied. An IV was inserted into the patient's right hand. Verbal informed consent was obtained.  Negative TCD Bubble study *See table(s) above for TCD measurements and observations.  Diagnosing physician: Antony Contras MD Electronically signed by Antony Contras MD on 04/28/2020 at 12:52:40 PM.    Final        Assessment/Plan: Diagnosis: right ACA infarct.   Stroke: Continue secondary stroke prophylaxis and Risk Factor Modification listed below:   Antiplatelet therapy:   Blood Pressure Management:  Continue current medication with prn's with permisive HTN per primary team Statin Agent:   Prediabetes management:   Left sided hemiparesis: fit for orthosis to prevent contractures (resting  hand splint for day, wrist cock up splint at night, PRAFO, etc) PT/OT for mobility, ADL training  Motor recovery: Zoloft Labs independently reviewed.  Records reviewed and summated above.   1. Does the need for close, 24 hr/day medical supervision in concert with the patient's rehab needs make it unreasonable for this patient to be served in a less intensive setting? Yes  2. Co-Morbidities requiring supervision/potential complications: Acute combined CHF (Monitor in accordance with increased physical activity and avoid UE resistance excercises), HTN (monitor and provide prns in accordance with increased physical exertion and pain), prediabetes (Monitor in accordance with exercise and adjust meds as necessary), Thrombocytopenia (< 60,000/mm3 no resistive exercise), CKD III (repeat labs, encourage fluids, avoid nephrotoxic meds) 3. Due to bladder management, bowel management, safety, skin/wound care, disease management, medication administration and patient education, does the patient require 24 hr/day rehab nursing? Yes 4. Does the patient require coordinated care of a physician, rehab nurse, therapy disciplines of PT/OT/SLP to address physical and functional deficits in the context of the above medical diagnosis(es)? Yes Addressing deficits in the following areas: balance, endurance, locomotion, strength, transferring, bowel/bladder control, bathing, dressing, feeding, grooming, toileting, speech and psychosocial support 5. Can the patient actively participate in an intensive therapy program of at least 3 hrs of therapy per day at least 5 days per week? Yes 6. The potential for patient to make measurable gains while on inpatient rehab is excellent 7. Anticipated functional outcomes upon discharge from inpatient rehab are min assist  with PT, min assist with OT, modified independent with SLP. 8. Estimated rehab length of stay to reach the above functional goals is: 17-21 days. 9. Anticipated discharge  destination: Home 10. Overall Rehab/Functional Prognosis: good   RECOMMENDATIONS: This patient's condition is appropriate for continued rehabilitative care in the following setting: CIR if adequate caregiver support available upon discharge Patient has agreed to participate in recommended program. Yes Note that insurance prior authorization may be required for reimbursement for recommended care.   Comment: Rehab Admissions Coordinator to follow up.   I have personally performed a face to face diagnostic evaluation, including, but not limited to relevant history and physical exam findings, of this patient and developed relevant assessment and plan.  Additionally, I have reviewed and concur with the  physician assistant's documentation above.    Delice Lesch, MD, ABPMR Cathlyn Parsons, PA-C 04/29/2020        Revision History                     Routing History                Note Details  Author Jamse Arn, MD File Time 04/29/2020  1:03 PM  Author Type Physician Status Signed  Last Editor Jamse Arn, MD Service Physical Medicine and [place] # [id number] Admit Date 05/03/2020

## 2020-05-03 NOTE — H&P (Signed)
Physical Medicine and Rehabilitation Admission H&P     Chief Complaint  Patient presents with  . Hypertension  : HPI: [name] is a 59 year old right-handed female with history of hypertension/CKD not on medication.  She lives with her 59 year old son and 2 adult daughters.  1 level home 2 steps to entry.  Independent prior to admission working at [employer].  Presented 04/27/2020 with left hemiparesis and dysarthria.  Systolic blood pressure 417 diastolic 408.  Admission chemistries potassium 2.9 BUN 26 creatinine 1.81 alcohol negative urine drug screen negative.  CT of the head showed hyperattenuating focus in the right thalamus age likely reflecting sequela of lacunar type infarct likely remote though in the absence of comparison overall age-indeterminate.  Patient did not receive TPA.  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  Severe chronic small vessel ischemia.  CT angiogram of head and neck severe right A2 segment narrowing correlating with acute infarct of the preceding MRI.  50% right cavernous ICA stenosis 55% left paraclinoid ICA stenosis.  Echocardiogram with ejection fraction 14% grade 2 diastolic dysfunction.  Presently on aspirin and Plavix for CVA prophylaxis x3 weeks and aspirin alone.  Subcutaneous Lovenox for DVT prophylaxis.  Patient did have a mild bump in creatinine 2.21 and received a fluid bolus.  Permissive hypertension closely monitoring.  Therapy evaluations completed patient was admitted for a comprehensive rehab program.   LBM was Saturday per pt AND RN in room. Voiding using purewick-  Perseverating on something stuck in teeth- Said her LUE/LLE were hurting, but they gave her tylenol and now feel good.        Review of Systems  Constitutional: Positive for malaise/fatigue. Negative for chills and fever.  HENT: Negative for hearing loss.   Eyes: Negative for blurred vision, double vision and photophobia.  Respiratory: Negative for shortness of breath.     Cardiovascular: Negative for chest pain, palpitations and leg swelling.  Gastrointestinal: Positive for constipation. Negative for heartburn, nausea and vomiting.  Genitourinary: Negative for dysuria, flank pain and hematuria.  Musculoskeletal: Positive for myalgias.  Skin: Negative for rash.  Neurological: Positive for dizziness, speech change, weakness and headaches.  All other systems reviewed and are negative.       Past Medical History:  Diagnosis Date  . Arthritis    . Hypertension      not taking medications         Past Surgical History:  Procedure Laterality Date  . CESAREAN SECTION             Family History  Problem Relation Age of Onset  . Cancer Mother    . Hypertension Father    . Heart disease Father    . Heart disease Brother    . Stroke Neg Hx      Social History:  reports that she has never smoked. She has never used smokeless tobacco. She reports that she does not drink alcohol and does not use drugs. Allergies: No Known Allergies       Medications Prior to Admission  Medication Sig Dispense Refill  . ibuprofen (ADVIL) 200 MG tablet Take 400 mg by mouth every 6 (six) hours as needed for headache or mild pain.          Drug Regimen Review Drug regimen was reviewed and remains appropriate with no significant issues identified   Home: Home Living Family/patient expects to be discharged to:: Private residence Living Arrangements: Children Available Help at Discharge: Family Type of Home: House  Home Access: Stairs to enter [employer] of Steps: 2 Entrance Stairs-Rails: Right, Left Home Layout: One level Bathroom Shower/Tub: Chiropodist: Standard Home Equipment: None Additional Comments: pt lives with 54 y.o. son and has 2 adult daughters who are able to assist as well. Pt's daughter [name] works weekends and is available during weekdays for support/assist.  Lives With: Son   Functional History: Prior  Function Level of Independence: Independent Comments: independent and working at [employer], driving    Functional Status:  Mobility: Bed Mobility Overal bed mobility: Needs Assistance Bed Mobility: Rolling, Supine to Sit Rolling: Max assist, +2 for physical assistance Supine to sit: Max assist, +2 for physical assistance, HOB elevated, +2 for safety/equipment Sit to sidelying: Total assist, +2 for safety/equipment, +2 for physical assistance General bed mobility comments: pt able to reach across body with R UE to bed rail, needs reinforcement to leave it there and to pull herself up, initiates movement of R LE to EOB but ultimately requires maxAx2 for LE mangement off bed, trunk to upright and pad scoot to EoB Transfers Overall transfer level: Needs assistance Equipment used: 2 person hand held assist Transfers: Sit to/from Stand, Stand Pivot Transfers Sit to Stand: Max assist, +2 physical assistance Stand pivot transfers: Max assist, +2 physical assistance Squat pivot transfers: Total assist, +2 physical assistance General transfer comment: Max assist and multimodal cues to get to midline and initiate sit to stand (from bed and then from recliner); good hip and knee extension weight bearing response when center of mass translated over feet; Max assist to pivot to recliner   ADL: ADL Overall ADL's : Needs assistance/impaired Grooming: Moderate assistance, Bed level Grooming Details (indicate cue type and reason): able to wash face using R hand, but mod asist required for bimanual tasks  Upper Body Bathing: Moderate assistance, Sitting Lower Body Bathing: +2 for physical assistance, +2 for safety/equipment, Sit to/from stand, Total assistance Upper Body Dressing : Maximal assistance, Sitting Lower Body Dressing: Total assistance, +2 for physical assistance, +2 for safety/equipment, Sit to/from stand Lower Body Dressing Details (indicate cue type and reason): total assist for socks,  requires +2 sit to stand  Toilet Transfer: Total assistance, +2 for physical assistance, +2 for safety/equipment, Stand-pivot Toilet Transfer Details (indicate cue type and reason): simulated to recliner  Functional mobility during ADLs: Maximal assistance, Total assistance, +2 for physical assistance, +2 for safety/equipment General ADL Comments: pt limited by impaired cognition, L sided hemiparesis, and impaired balance    Cognition: Cognition Overall Cognitive Status: Impaired/Different from baseline Arousal/Alertness: Awake/alert Orientation Level: Oriented X4 Attention: Sustained Sustained Attention: Impaired Memory: Impaired Memory Impairment: Retrieval deficit (3/5 recalled without cues, 1/5 with category cue, 1/5 with multiple choice) Awareness: Appears intact (after difficulty performing test items, pt able to state difficulty with attention) Problem Solving: Impaired Problem Solving Impairment: Functional basic (clock drawing impaired numbering 5, 10, 15, etc..) Behaviors: Restless, Impulsive Safety/Judgment: Impaired Comments: pt able to verbalize she needs to have help at home Cognition Arousal/Alertness: Awake/alert, Lethargic Behavior During Therapy: Flat affect Overall Cognitive Status: Impaired/Different from baseline Area of Impairment: Attention, Memory, Safety/judgement, Following commands, Awareness, Problem solving Current Attention Level: Sustained Memory: Decreased recall of precautions, Decreased short-term memory Following Commands: Follows one step commands with increased time, Follows multi-step commands inconsistently Safety/Judgement: Decreased awareness of safety, Decreased awareness of deficits Awareness: Emergent Problem Solving: Slow processing, Decreased initiation, Difficulty sequencing, Requires verbal cues, Requires tactile cues General Comments: patient awake initally but becomes lethargic and has  difficulty maintaining eyes open during session;  requires increased time to follow 1 step commands    Physical Exam: Blood pressure (!) 168/78, pulse (!) 108, temperature 98.2 F (36.8 C), temperature source Oral, resp. rate 17, height 5\' 4"  (1.626 m), weight 72.6 kg, SpO2 100 %. Physical Exam Vitals and nursing note reviewed. Exam conducted with a chaperone present.  Constitutional:      Comments: Awake, lethargic, dysarthria evident, supine in bed- somewhat woozy appearing, NAD  HENT:     Head: Normocephalic and atraumatic.     Comments: L facial droop; couldn't smile for me; tongue midline    Right Ear: External ear normal.     Left Ear: External ear normal.     Nose: Nose normal. No congestion.     Mouth/Throat:     Mouth: Mucous membranes are dry.     Pharynx: Oropharynx is clear. No oropharyngeal exudate.  Eyes:     Comments: Couldn't keep  Eyes open- opened x1 time for ~ 5 seconds, said she tried, but couldn't open either eyelid more than 1 time- and was still open <25% when opened- couldn't see pupils  Cardiovascular:     Comments: RRR- 2-3/6 murmur heard-  Pulmonary:     Comments: CTA B/L- no W/R/R- good air movement- B/L Abdominal:     Comments: Soft, NT, slightly distended- hypoactive BS  Genitourinary:    Comments: purewick in place with medium amber urine Musculoskeletal:     Cervical back: Normal range of motion and neck supple. No rigidity.     Comments: Couldn't follow commands in general very well- even 1 step simple commands- wasn't able to show strength in RUE/RLE, but when asked to squeeze my hand on Hand, she squeezed with her right hand 5/5- when asked to lift L leg, she lifted R leg off bed at least 4/5 in HF 0/5 in LUE/LLE  Skin:    Comments: Heels boggy B/L IV in R antecubital fossa- looks good No skin breakdown on backside per pt's RN- pt refused to turn  Neurological:     Comments: Patient is awake and alert.  Mild dysarthria.  Follows commands some for PA  Leaning to Right in bed- head tilted to R-  perseverating on something stuck in teeth- couldn't change topic.  No difference in sensation on face B/L Light touch in tact in all 4 extremities, per pt.   Psychiatric:     Comments: Sing song voice/responses- vague; very lethargic        Lab Results Last 48 Hours        Results for orders placed or performed during the hospital encounter of 04/26/20 (from the past 48 hour(s))  CBC     Status: Abnormal    Collection Time: 05/02/20  2:13 AM  Result Value Ref Range    WBC 15.2 (H) 4.0 - 10.5 K/uL    RBC 4.06 3.87 - 5.11 MIL/uL    Hemoglobin 11.4 (L) 12.0 - 15.0 g/dL    HCT 35.1 (L) 36 - 46 %    MCV 86.5 80.0 - 100.0 fL    MCH 28.1 26.0 - 34.0 pg    MCHC 32.5 30.0 - 36.0 g/dL    RDW 14.6 11.5 - 15.5 %    Platelets 168 150 - 400 K/uL    nRBC 0.0 0.0 - 0.2 %      Comment: Performed at [facility], [place] [address] [place], [place] [address]  Iron and TIBC  Status: Abnormal    Collection Time: 05/02/20  2:13 AM  Result Value Ref Range    Iron 18 (L) 28 - 170 ug/dL    TIBC 267 250 - 450 ug/dL    Saturation Ratios 7 (L) 10.4 - 31.8 %    UIBC 249 ug/dL      Comment: Performed at [facility], [place] [address], [place], [place] [address]  Ferritin     Status: None    Collection Time: 05/02/20  2:13 AM  Result Value Ref Range    Ferritin 68 11 - 307 ng/mL      Comment: Performed at [facility] [address], [place], [place] [address]  Glucose, capillary     Status: Abnormal    Collection Time: 05/02/20  4:23 PM  Result Value Ref Range    Glucose-Capillary 135 (H) 70 - 99 mg/dL      Comment: Glucose reference range applies only to samples taken after fasting for at least 8 hours.  Urinalysis, Routine w reflex microscopic Urine, Clean Catch     Status: Abnormal    Collection Time: 05/02/20  8:05 PM  Result Value Ref Range    Color, Urine YELLOW YELLOW    APPearance HAZY (A) CLEAR    Specific Gravity, Urine 1.012 1.005 - 1.030    pH 5.0 5.0 - 8.0     Glucose, UA NEGATIVE NEGATIVE mg/dL    Hgb urine dipstick NEGATIVE NEGATIVE    Bilirubin Urine NEGATIVE NEGATIVE    Ketones, ur NEGATIVE NEGATIVE mg/dL    Protein, ur NEGATIVE NEGATIVE mg/dL    Nitrite NEGATIVE NEGATIVE    Leukocytes,Ua NEGATIVE NEGATIVE      Comment: Performed at [facility] [address] [place], [place] [address]  CBC     Status: Abnormal    Collection Time: 05/03/20  3:09 AM  Result Value Ref Range    WBC 14.1 (H) 4.0 - 10.5 K/uL    RBC 3.92 3.87 - 5.11 MIL/uL    Hemoglobin 11.1 (L) 12.0 - 15.0 g/dL    HCT 34.4 (L) 36 - 46 %    MCV 87.8 80.0 - 100.0 fL    MCH 28.3 26.0 - 34.0 pg    MCHC 32.3 30.0 - 36.0 g/dL    RDW 14.6 11.5 - 15.5 %    Platelets 147 (L) 150 - 400 K/uL      Comment: REPEATED TO VERIFY    nRBC 0.0 0.0 - 0.2 %      Comment: Performed at [facility], [place] [address], [place], [place] [address]  Basic metabolic panel     Status: Abnormal    Collection Time: 05/03/20  3:09 AM  Result Value Ref Range    Sodium 139 135 - 145 mmol/L    Potassium 3.7 3.5 - 5.1 mmol/L    Chloride 104 98 - 111 mmol/L    CO2 23 22 - 32 mmol/L    Glucose, Bld 102 (H) 70 - 99 mg/dL      Comment: Glucose reference range applies only to samples taken after fasting for at least 8 hours.    BUN 44 (H) 6 - 20 mg/dL    Creatinine, Ser 2.21 (H) 0.44 - 1.00 mg/dL    Calcium 9.5 8.9 - 10.3 mg/dL    GFR calc non Af Amer 24 (L) >60 mL/min    GFR calc Af Amer 28 (L) >60 mL/min    Anion  gap 12 5 - 15      Comment: Performed at [facility], [place] [address], [place], [place] [address]       Imaging Results (Last 48 hours)  CT HEAD WO CONTRAST   Result Date: 05/02/2020 CLINICAL DATA:  Status post right ACA infarct EXAM: CT HEAD WITHOUT CONTRAST TECHNIQUE: Contiguous axial images were obtained from the base of the skull through the vertex without intravenous contrast. COMPARISON:  04/27/2020 FINDINGS: Brain: Geographic area of decreased attenuation is noted in  the distribution of the distal aspect of the right anterior cerebral artery similar to that seen on recent MRI consistent with subacute infarct. Basal ganglia calcifications are seen. Mild chronic white matter ischemic changes are seen as well. Stable right thalamic lacunar infarct is noted as well. No acute hemorrhage or new focal infarct is seen. Vascular: No hyperdense vessel or unexpected calcification. Skull: Normal. Negative for fracture or focal lesion. Sinuses/Orbits: Mucosal retention cyst is noted in the left maxillary antrum stable from the prior study. Other: None. IMPRESSION: Geographic area of decreased attenuation in the distribution of the right anterior cerebral artery consistent with the ischemia seen on recent MRI. No new focal abnormality is seen. Electronically Signed   By: Inez Catalina M.D.   On: 05/02/2020 17:56             Medical Problem List and Plan: 1.  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  Antithrombotics: -DVT/anticoagulation: Lovenox             -antiplatelet therapy: Aspirin 325 mg daily and Plavix 75 mg daily x3 weeks then aspirin alone 3. Pain Management: Tylenol as needed 4. Mood: Provide emotional support             -antipsychotic agents: N/A 5. Neuropsych: This patient is? capable of making decisions on her own behalf. 6. Skin/Wound Care: Routine skin checks- of note, heels boggy B/L 7. Fluids/Electrolytes/Nutrition: Routine in and outs with follow-up chemistries 8.  Hypertension- was 267/160 upon admission.  Hydralazine 75 mg every 8 hours, Norvasc 10 mg daily.  Monitor with increased mobility 9.  Hyperlipidemia.  Lipitor 10.  AKI on CKD.  Follow-up chemistries.  Normal renal function 2017 but no recent visits, does not have a PCP.  Suspect creatinine chronically elevated due to uncontrolled hypertension. Doesn't have  PCP.  11. Lethargy- suggest some amantadine to wake pt up, so can participate in therapies 12. Leukocytosis- U/A (-)- WBC is 14k- down from 15k- but up from 9.4k- will monitor   Cathlyn Parsons, PA-C 05/03/2020      I have personally performed a face to face diagnostic evaluation of this patient and formulated the key components of the plan.  Additionally, I have personally reviewed laboratory data, imaging studies, as well as relevant notes and concur with the physician assistant's documentation above.   The patient's status has not changed from the original H&P.  Any changes in documentation from the acute care chart have been noted above.

## 2020-05-03 NOTE — IPOC Note (Signed)
Individualized overall Plan of Care [facility]) Patient Details Name: [name] MRN: [id number] DOB: 06-03-1961  Admitting Diagnosis: CVA (cerebral vascular accident) [facility])  Hospital Problems: Principal Problem:   CVA (cerebral vascular accident) ([place]) Active Problems:   Pressure injury of skin   Thrombocytosis (H[facility])   Leukocytosis   AKI (acute kidney injury) ([place])   Benign essential HTN   Lethargy     Functional Problem List: Nursing Bladder, Bowel, Medication Management, Motor, Nutrition, Safety, Perception, Skin Integrity  PT Balance, Behavior, Sensory, Safety, Perception, Pain, Skin Integrity, Endurance, Motor, Nutrition  OT Balance, Cognition, Perception, Safety, Sensory, Endurance, Motor, Vision  SLP    TR         Basic ADL's: OT Eating, Grooming, Bathing, Dressing, Toileting     Advanced  ADL's: OT       Transfers: PT Bed Mobility, Bed to Chair, Teacher, early years/pre, Tub/Shower     Locomotion: PT Ambulation, Emergency planning/management officer, Stairs     Additional Impairments: OT Fuctional Use of Upper Extremity  SLP Swallowing, Communication, Social Cognition expression, comprehension Awareness, Attention  TR      Anticipated Outcomes Item Anticipated Outcome  Self Feeding supervision  Swallowing  Supervision A   Basic self-care  min to mod assist  Toileting  mod assist   Bathroom Transfers min to mod assist  Bowel/Bladder  Patient to continent of bowel/bladder with Mod I  Transfers  minA  Locomotion  minA 76ft  Communication  Supervision A  Cognition  Supervision A  Pain  <2  Safety/Judgment  Able to call for help and express need and to follow safety precaution with Mod I   Therapy Plan: PT Intensity: Minimum of 1-2 x/day ,45 to 90 minutes PT Frequency: 5 out of 7 days PT Duration Estimated Length of Stay: 3 weeks OT Intensity: Minimum of 1-2 x/day, 45 to 90 minutes OT Frequency: 5 out of 7 days OT Duration/Estimated Length of Stay: 25-28  days SLP Intensity: Minumum of 1-2 x/day, 30 to 90 minutes SLP Frequency: 3 to 5 out of 7 days SLP Duration/Estimated Length of Stay: 2.5-3 weeks    Team Interventions: Nursing Interventions Patient/Family Education, Bladder Management, Bowel Management, Disease Management/Prevention, Pain Management, Medication Management, Skin Care/Wound Management, Cognitive Remediation/Compensation, Dysphagia/Aspiration Precaution Training, Discharge Planning  PT interventions Ambulation/gait training, Discharge planning, Functional mobility training, Psychosocial support, Therapeutic Activities, Visual/perceptual remediation/compensation, Wheelchair propulsion/positioning, Therapeutic Exercise, Skin care/wound management, Neuromuscular re-education, Disease management/prevention, Training and development officer, Cognitive remediation/compensation, DME/adaptive equipment instruction, Pain management, Splinting/orthotics, UE/LE Strength taining/ROM, UE/LE Coordination activities, Stair training, Patient/family education, Functional electrical stimulation, Community reintegration  OT Interventions Training and development officer, Discharge planning, Functional electrical stimulation, Pain management, Self Care/advanced ADL retraining, Therapeutic Activities, UE/LE Coordination activities, Visual/perceptual remediation/compensation, Therapeutic Exercise, Patient/family education, Functional mobility training, Disease mangement/prevention, Cognitive remediation/compensation, DME/adaptive equipment instruction, Neuromuscular re-education, Psychosocial support, Splinting/orthotics, UE/LE Strength taining/ROM, Wheelchair propulsion/positioning, Community reintegration  SLP Interventions Cognitive remediation/compensation, English as a second language teacher, Dysphagia/aspiration precaution training, Functional tasks, Internal/external aids, Speech/Language facilitation, Patient/family education  TR Interventions    SW/CM Interventions Discharge  Planning, Psychosocial Support, Patient/Family Education   Barriers to Discharge MD  Medical stability, Wound care, and Behavior  Nursing Incontinence patient to be continent at the time of discharge  PT Inaccessible home environment, Home environment access/layout, Wound Care, Lack of/limited family support, Incontinence 3-4 steps, LHB plegia, pusher  OT Decreased caregiver support Family will have to organize 24 hour supervision /assist  SLP      SW       Team  Discharge Planning: Destination: PT-Home ,OT- Home , SLP-Home Projected Follow-up: PT-Home health PT, OT-  Home health OT, 24 hour supervision/assistance, SLP-Home Health SLP, Outpatient SLP, 24 hour supervision/assistance Projected Equipment Needs: PT-To be determined, OT- To be determined, SLP-None recommended by SLP Equipment Details: PT-Pt has: cane and standard walker, OT-  Patient/family involved in discharge planning: PT- Patient, Family member/caregiver,  OT-Patient, Family member/caregiver, SLP-Patient  MD ELOS: 22-26 days. Medical Rehab Prognosis:  Good Assessment: Right-handed female with history of hypertension/CKD not on medication.  Presented 04/27/2020 with left hemiparesis and dysarthria.  Systolic blood pressure 937 diastolic 169.  Admission chemistries potassium 2.9 BUN 26 creatinine 1.81 alcohol negative urine drug screen negative.  CT of the head showed hyperattenuating focus in the right thalamus age likely reflecting sequela of lacunar type infarct likely remote though in the absence of comparison overall age-indeterminate.  Patient did not receive TPA.  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  Severe chronic small vessel ischemia.  CT angiogram of head and neck severe right A2 segment narrowing correlating with acute infarct of the preceding MRI.  50% right cavernous ICA stenosis 55% left paraclinoid ICA stenosis.  Echocardiogram with ejection fraction 67% grade 2 diastolic dysfunction.  Presently on  aspirin and Plavix for CVA prophylaxis x3 weeks and aspirin alone.  Patient did have a mild bump in creatinine 2.21 and received a fluid bolus.  Patient with resulting functional deficits with mobility, transfers, self-care.  Will set goals for Min/Mod A with PT/OT and Supervision with SLP.  Due to the current state of emergency, patients may not be receiving their 3-hours of Medicare-mandated therapy.  See Team Conference Notes for weekly updates to the plan of care

## 2020-05-03 NOTE — Progress Notes (Signed)
Inpatient Rehabilitation Admissions Coordinator  I have CIR bed to admit patient today. I met with patient and her daughter, [name] at bedside. They are in agreement. I have notified Dr. Lonny Prude, acute team and TOC. I will make the arrangements  to admit today.  Danne Baxter, RN, MSN Rehab Admissions Coordinator [phone] 05/03/2020 10:43 AM

## 2020-05-04 ENCOUNTER — Inpatient Hospital Stay (HOSPITAL_COMMUNITY): Payer: 59 | Admitting: Occupational Therapy

## 2020-05-04 ENCOUNTER — Inpatient Hospital Stay (HOSPITAL_COMMUNITY): Payer: 59

## 2020-05-04 DIAGNOSIS — D72829 Elevated white blood cell count, unspecified: Secondary | ICD-10-CM

## 2020-05-04 DIAGNOSIS — D75839 Thrombocytosis, unspecified: Secondary | ICD-10-CM

## 2020-05-04 DIAGNOSIS — R5383 Other fatigue: Secondary | ICD-10-CM

## 2020-05-04 DIAGNOSIS — L8990 Pressure ulcer of unspecified site, unspecified stage: Secondary | ICD-10-CM | POA: Insufficient documentation

## 2020-05-04 DIAGNOSIS — I6300 Cerebral infarction due to thrombosis of unspecified precerebral artery: Secondary | ICD-10-CM

## 2020-05-04 DIAGNOSIS — N179 Acute kidney failure, unspecified: Secondary | ICD-10-CM

## 2020-05-04 DIAGNOSIS — D473 Essential (hemorrhagic) thrombocythemia: Secondary | ICD-10-CM

## 2020-05-04 DIAGNOSIS — I10 Essential (primary) hypertension: Secondary | ICD-10-CM

## 2020-05-04 LAB — COMPREHENSIVE METABOLIC PANEL
ALT: 24 U/L (ref 0–44)
AST: 27 U/L (ref 15–41)
Albumin: 2.9 g/dL — ABNORMAL LOW (ref 3.5–5.0)
Alkaline Phosphatase: 57 U/L (ref 38–126)
Anion gap: 10 (ref 5–15)
BUN: 36 mg/dL — ABNORMAL HIGH (ref 6–20)
CO2: 22 mmol/L (ref 22–32)
Calcium: 9.1 mg/dL (ref 8.9–10.3)
Chloride: 106 mmol/L (ref 98–111)
Creatinine, Ser: 1.88 mg/dL — ABNORMAL HIGH (ref 0.44–1.00)
GFR calc Af Amer: 34 mL/min — ABNORMAL LOW (ref >60)
GFR calc non Af Amer: 29 mL/min — ABNORMAL LOW (ref >60)
Glucose, Bld: 109 mg/dL — ABNORMAL HIGH (ref 70–99)
Potassium: 3.5 mmol/L (ref 3.5–5.1)
Sodium: 138 mmol/L (ref 135–145)
Total Bilirubin: 0.5 mg/dL (ref 0.3–1.2)
Total Protein: 7.1 g/dL (ref 6.5–8.1)

## 2020-05-04 LAB — CBC WITH DIFFERENTIAL/PLATELET
Abs Immature Granulocytes: 0.03 10*3/uL (ref 0.00–0.07)
Basophils Absolute: 0.1 10*3/uL (ref 0.0–0.1)
Basophils Relative: 1 %
Eosinophils Absolute: 0.1 10*3/uL (ref 0.0–0.5)
Eosinophils Relative: 1 %
HCT: 31.6 % — ABNORMAL LOW (ref 36.0–46.0)
Hemoglobin: 10.1 g/dL — ABNORMAL LOW (ref 12.0–15.0)
Immature Granulocytes: 0 %
Lymphocytes Relative: 15 %
Lymphs Abs: 1.7 10*3/uL (ref 0.7–4.0)
MCH: 28.3 pg (ref 26.0–34.0)
MCHC: 32 g/dL (ref 30.0–36.0)
MCV: 88.5 fL (ref 80.0–100.0)
Monocytes Absolute: 1.2 10*3/uL — ABNORMAL HIGH (ref 0.1–1.0)
Monocytes Relative: 10 %
Neutro Abs: 8.3 10*3/uL — ABNORMAL HIGH (ref 1.7–7.7)
Neutrophils Relative %: 73 %
Platelets: 149 10*3/uL — ABNORMAL LOW (ref 150–400)
RBC: 3.57 MIL/uL — ABNORMAL LOW (ref 3.87–5.11)
RDW: 14.4 % (ref 11.5–15.5)
WBC: 11.2 10*3/uL — ABNORMAL HIGH (ref 4.0–10.5)
nRBC: 0 % (ref 0.0–0.2)

## 2020-05-04 MED ORDER — AMANTADINE HCL 100 MG PO CAPS
100.0000 mg | ORAL_CAPSULE | Freq: Two times a day (BID) | ORAL | Status: DC
  Filled 2020-05-04: qty 1

## 2020-05-04 MED ORDER — AMANTADINE HCL 50 MG/5ML PO SYRP
100.0000 mg | ORAL_SOLUTION | Freq: Two times a day (BID) | ORAL | Status: DC
  Administered 2020-05-05 – 2020-06-01 (×53): 100 mg via ORAL
  Filled 2020-05-04 (×59): qty 10

## 2020-05-04 NOTE — Evaluation (Signed)
Speech Language Pathology Assessment and Plan  Patient Details  Name: [name] MRN: [id number] Date of Birth: 08-05-1961  SLP Diagnosis: Cognitive Impairments;Dysarthria;Dysphagia  Rehab Potential: Good ELOS: 2.5-3 weeks    Today's Date: 05/04/2020 SLP Individual Time: 0802-0900 SLP Individual Time Calculation (min): 58 min   Hospital Problem: Principal Problem:   CVA (cerebral vascular accident) ([place]) Active Problems:   Pressure injury of skin   Thrombocytosis (HCC)   Leukocytosis   AKI (acute kidney injury) ([place])   Benign essential HTN   Lethargy  Past Medical History:  Past Medical History:  Diagnosis Date  . Arthritis   . Hypertension   . Hypertension    not taking medications   Past Surgical History:  Past Surgical History:  Procedure Laterality Date  . CESAREAN SECTION      Assessment / Plan / Recommendation Clinical Impression [name] a 59 year old right-handed female with history of hypertension/CKDnot on medication. She lives with her 23 year old son and 2 adult daughters. 1 level home 2 steps to entry. Independent prior to admission working at [employer]. Presented 04/27/2020 with left hemiparesis and dysarthria. Systolic blood pressure 782 diastolic 423. Admission chemistries potassium 2.9 BUN 26 creatinine 1.81 alcohol negative urine drug screen negative. CT of the head showed hyperattenuating focus in the right thalamus age likely reflecting sequela of lacunar type infarct likely remote though in the absence of comparison overall age-indeterminate. Patient did not receive TPA. 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 Severe chronic small vessel ischemia. CT angiogram of head and neck severe right A2 segment narrowing correlating with acute infarct of the preceding MRI. 50% right cavernous ICA stenosis 55% left paraclinoid ICA stenosis. Echocardiogram with ejection fraction 53% grade 2 diastolic dysfunction. Presently on  aspirin and Plavix for CVA prophylaxis x3 weeks and aspirin alone. Subcutaneous Lovenox for DVT prophylaxis.Patient did have a mild bump in creatinine 2.21 and received a fluid bolus. Permissive hypertension closely monitoring. Therapy evaluations completed patient was admitted for a comprehensive rehab program.  Pt presents with primary impairments of reduced alertness/sustained attention and motor/speech preservation impacting cognitive skills in problem solving, intellectual/emergent awareness, following 1 step commands and short term recall. Pt demonstrated alertness for the initial 15 minutes of evaluation then required max A multimodal cues to keep eyes open limiting participation in assessment. Pt demonstrated intellectual awareness of physical deficits (left side weakness), speech changes ( " I repeat everything") and denied cognitive changes except for reduced alertness. Pt's intelligibility is impacted by verbal preservation at the phrase and sentence level, further impacted by low vocal intensity, fast rate and mild imprecise articulation.  Pt demonstrated motor perseveration requiring hand over hand assistance to stop preservation when given a cold cloth to wash face and increase alertness. SLP was unable to complete formal cognitive linguistic assessment SLUMS due to lethargy. Pt was orientated to person, situation, place and time (expect for day of the week and date), immediate recall of 1 out 5 words and calculated how much money was spent given problem solving question. Pt's problem solving ability appears somewhat is intact, (able to utilize call bell button when prompted) however, overall problem solving and following 1 step commands are further impacted due to motor planning deficits.   Pt presents with moderate oral dysphagia due to general weakness (especially on left side) and reduced alertness. Oral motor exam pt demonstrated ability to intermittently follow commands. Pt consumed thin  liquids via straw in which swallow appeared timely and no overt s/s aspiration noted.  Pt demonstrated reduced bolus cohesion, increase left buccal pocketing with dys 3 and regular textures on breakfast tray. Boluses from left buccal pocket were eventually removed by SLP using oral swab after pt demonstrated limited success with lingual and finger sweeps. PO consumption of dys 2 and dys 1 textures resulted in increased oral clearance. No overt s/s aspiration were noted with solids textures. SLP recommends diet of dys 2 textures and thin liquids with full supervision. Pt would benefit from skilled ST services in order to maximize functional independence and reduce burden of care, requiring 24 hour supervision at discharge with continued skilled ST services.   Skilled Therapeutic Interventions          Skilled ST services focused on cognitive skills. SLP facilitated administration of cognitive linguistic assessment and provided education of results. SLP set goals for cognitive linguistic, swallow and speech needs during length of stay. Pt was left in room with call bell within reach and bed alarm set. SLP recommends to continue skilled services.  SLP Assessment  Patient will need skilled Speech Lanaguage Pathology Services during CIR admission    Recommendations  SLP Diet Recommendations: Dysphagia 2 (Fine chop);Thin Liquid Administration via: Straw;Cup Medication Administration: Crushed with puree Supervision: Full supervision/cueing for compensatory strategies Compensations: Minimize environmental distractions;Small sips/bites;Lingual sweep for clearance of pocketing Postural Changes and/or Swallow Maneuvers: Seated upright 90 degrees Oral Care Recommendations: Oral care BID Patient destination: Home Follow up Recommendations: Home Health SLP;Outpatient SLP;24 hour supervision/assistance Equipment Recommended: None recommended by SLP    SLP Frequency 3 to 5 out of 7 days   SLP Duration  SLP  Intensity  SLP Treatment/Interventions 2.5-3 weeks  Minumum of 1-2 x/day, 30 to 90 minutes  Cognitive remediation/compensation;Cueing hierarchy;Dysphagia/aspiration precaution training;Functional tasks;Internal/external aids;Speech/Language facilitation;Patient/family education    Pain Pain Assessment Pain Scale: 0-10 Pain Score: 0-No pain  Prior Functioning Cognitive/Linguistic Baseline: Within functional limits Type of Home: House  Lives With: Son;Significant other Available Help at Discharge: Family;Available 24 hours/day Education: daughter 26 CNA full time, son 72 yo, another daughter in [place] grown- 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, 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 Vocation: Full time employment  SLP Evaluation Cognition Overall Cognitive Status: Impaired/Different from baseline Arousal/Alertness: Lethargic Orientation Level: Oriented to person;Disoriented to time;Oriented to place;Oriented to situation Attention: Sustained;Focused Focused Attention: Impaired Focused Attention Impairment: Functional basic;Verbal basic Sustained Attention: Impaired Sustained Attention Impairment: Functional basic;Verbal basic Selective Attention: Impaired Selective Attention Impairment: Functional basic;Verbal basic Memory: Impaired Awareness: Impaired Awareness Impairment: Emergent impairment;Intellectual impairment Problem Solving: Impaired Problem Solving Impairment: Functional basic;Verbal basic Behaviors: Restless;Perseveration Safety/Judgment: Impaired  Comprehension Auditory Comprehension Overall Auditory Comprehension: Impaired Yes/No Questions: Not tested Commands: Impaired One Step Basic Commands: 50-74% accurate Expression Expression Primary Mode of Expression: Verbal Verbal Expression Overall Verbal Expression: Appears within functional limits for tasks assessed Initiation: No impairment Level of Generative/Spontaneous Verbalization: Sentence Repetition: No impairment  (simple level) Naming: Not tested Interfering Components: Attention;Speech intelligibility Written Expression Dominant Hand: Right Oral Motor Oral Motor/Sensory Function Overall Oral Motor/Sensory Function: Generalized oral weakness Motor Speech Overall Motor Speech: Impaired Respiration: Within functional limits Phonation: Low vocal intensity Resonance: Within functional limits Articulation: Impaired Level of Impairment: Sentence Intelligibility: Intelligibility reduced Word: 75-100% accurate Phrase: 75-100% accurate Sentence: 50-74% accurate Motor Planning: Not tested Effective Techniques: Slow rate;Increased vocal intensity  Care Tool Care Tool Cognition Expression of Ideas and Wants Expression of Ideas and Wants: Frequent difficulty - frequently exhibits difficulty with expressing needs and ideas   Understanding Verbal and Non-Verbal Content Understanding Verbal  and Non-Verbal Content: Sometimes understands - understands only basic conversations or simple, direct phrases. Frequently requires cues to understand   Memory/Recall Ability *first 3 days only       PMSV Assessment  PMSV Trial Intelligibility: Intelligibility reduced Word: 75-100% accurate Phrase: 75-100% accurate Sentence: 50-74% accurate  Bedside Swallowing Assessment General Diet Prior to this Study: Regular;Thin liquids Behavior/Cognition: Lethargic/Drowsy Oral Cavity - Dentition: Missing dentition Self-Feeding Abilities: Needs assist Patient Positioning: Upright in bed Baseline Vocal Quality: Low vocal intensity Volitional Cough: Cognitively unable to elicit Volitional Swallow: Unable to elicit  Oral Care Assessment Does patient have any of the following "high(er) risk" factors?: None of the above Does patient have any of the following "at risk" factors?: None of the above Patient is LOW RISK: Follow universal precautions (see row information) Ice Chips Ice chips: Not tested Thin Liquid Thin  Liquid: Within functional limits Presentation: Straw Nectar Thick Nectar Thick Liquid: Not tested Honey Thick Honey Thick Liquid: Not tested Puree Puree: Within functional limits Presentation: Spoon Solid Solid: Impaired Presentation: Self Fed Oral Phase Impairments: Poor awareness of bolus Oral Phase Functional Implications: Left lateral sulci pocketing;Oral residue BSE Assessment Risk for Aspiration Impact on safety and function: Mild aspiration risk (attention) Other Related Risk Factors: Lethargy;Cognitive impairment  Short Term Goals: Week 1: SLP Short Term Goal 1 (Week 1): Pt will follow 1 step commands in functional tasks with mod A multimodal cues. SLP Short Term Goal 2 (Week 1): Pt will self-monitor and self-correct functional and verbal errors with max A multimodal cues. SLP Short Term Goal 3 (Week 1): Pt will demonstrate alertness and sustained attention in 5 minute intervals with mod A verbal cues for redirection. SLP Short Term Goal 4 (Week 1): Pt will demonstrate 80% intelligibility at the phrase level with min A verbal cues to increase vocal intensity and slow rate. SLP Short Term Goal 5 (Week 1): Pt will consume dys 2 textures and thin liquids with min A verbal cues for swallow strategies and minimal overt s/s aspiration.  Refer to Care Plan for Long Term Goals  Recommendations for other services: None   Discharge Criteria: Patient will be discharged from SLP if patient refuses treatment 3 consecutive times without medical reason, if treatment goals not met, if there is a change in medical status, if patient makes no progress towards goals or if patient is discharged from hospital.  The above assessment, treatment plan, treatment alternatives and goals were discussed and mutually agreed upon: by patient  Dannis Deroche  [facility] 05/04/2020, 2:45 PM

## 2020-05-04 NOTE — Progress Notes (Signed)
Patient ID: [name], female   DOB: 12-Jan-1961, 59 y.o.   MRN: [id number] Met with the patient and daughter to review role of the nurse CM to address nursing concerns and collaboration with the SW to facilitate preparations for discharge. Reviewed medical history including risks for stroke and management of secondary risk factors; specifically HTN. Given handouts and education on renal healthy and DASH diet along with information on cooking without salt and management of blood pressure handbook. Reviewed no PCP and agreement for CM to set up appointment with [facility] PCP after discharge. Patient's daughter confirmed insurance and pharmacy is correct in the EMR. Appointment scheduled for June 08, 2020 @ 0930 with Freeman Caldron PA on [address] Note wound on coccyx however no other skin issues noted at present. Continue to follow for nursing issues and questions. Margarito Liner

## 2020-05-04 NOTE — Plan of Care (Signed)
  Problem: Consults Goal: RH STROKE PATIENT EDUCATION Description: See Patient Education module for education specifics  Outcome: Progressing Goal: Nutrition Consult-if indicated Outcome: Progressing Goal: Diabetes Guidelines if Diabetic/Glucose > 140 Description: If diabetic or lab glucose is > 140 mg/dl - Initiate Diabetes/Hyperglycemia Guidelines & Document Interventions  Outcome: Progressing   Problem: RH BOWEL ELIMINATION Goal: RH STG MANAGE BOWEL WITH ASSISTANCE Description: STG Manage Bowel with Mod I Assistance. Outcome: Progressing Goal: RH STG MANAGE BOWEL W/MEDICATION W/ASSISTANCE Description: STG Manage Bowel with Medication with Mod I Assistance. Outcome: Progressing   Problem: RH BLADDER ELIMINATION Goal: RH STG MANAGE BLADDER WITH ASSISTANCE Description: STG Manage Bladder With Mod I Assistance Outcome: Progressing   Problem: RH SKIN INTEGRITY Goal: RH STG SKIN FREE OF INFECTION/BREAKDOWN Description: Mod I Outcome: Progressing   Problem: RH SAFETY Goal: RH STG ADHERE TO SAFETY PRECAUTIONS W/ASSISTANCE/DEVICE Description: STG Adhere to Safety Precautions With Mod I Assistance/Device. Outcome: Progressing   Problem: RH PAIN MANAGEMENT Goal: RH STG PAIN MANAGED AT OR BELOW PT'S PAIN GOAL Description: <2 Outcome: Progressing   Problem: RH KNOWLEDGE DEFICIT Goal: RH STG INCREASE KNOWLEDGE OF HYPERTENSION Description: Patient will be able to manage HTN with medications and dietary restrictions using handouts and educational information independently Outcome: Progressing Goal: RH STG INCREASE KNOWLEDGE OF STROKE PROPHYLAXIS Description: Patient will acknowledge understanding of secondary stroke risks and management with stroke prophylaxis medications using handouts and educational materials independently Outcome: Progressing

## 2020-05-04 NOTE — Evaluation (Addendum)
Occupational Therapy Assessment and Plan  Patient Details  Name: [name] MRN: [id number] Date of Birth: [birth year]  OT Diagnosis: abnormal posture, apraxia, cognitive deficits, hemiplegia affecting non-dominant side and muscle weakness (generalized) Rehab Potential: Rehab Potential (ACUTE ONLY): Good ELOS: 25-28 days   Today's Date: 05/04/2020 OT Individual Time: 1300-1405 OT Individual Time Calculation (min): 65 min     Hospital Problem: Principal Problem:   CVA (cerebral vascular accident) ([place]) Active Problems:   Pressure injury of skin   Thrombocytosis (HCC)   Leukocytosis   AKI (acute kidney injury) ([place])   Benign essential HTN   Lethargy   Past Medical History:  Past Medical History:  Diagnosis Date  . Arthritis   . Hypertension   . Hypertension    not taking medications   Past Surgical History:  Past Surgical History:  Procedure Laterality Date  . CESAREAN SECTION      Assessment & Plan Clinical Impression: Patient is a 59 y.o. year old female with recent admission to the hospital on 04/27/2020 with left hemiparesis and dysarthria. Systolic blood pressure 517 diastolic 616. Admission chemistries potassium 2.9 BUN 26 creatinine 1.81 alcohol negative urine drug screen negative. CT of the head showed hyperattenuating focus in the right thalamus age likely reflecting sequela of lacunar type infarct likely remote though in the absence of comparison overall age-indeterminate. Patient did not receive TPA. 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 Severe chronic small vessel ischemia. CT angiogram of head and neck severe right A2 segment narrowing correlating with acute infarct of the preceding MRI.  Patient transferred to CIR on 05/03/2020 .    Patient currently requires total with basic self-care skills secondary to muscle weakness, impaired timing and sequencing, abnormal tone, unbalanced muscle activation, motor apraxia, ataxia, decreased  coordination and decreased motor planning, decreased midline orientation, decreased attention to left, left side neglect and decreased motor planning, decreased attention, decreased awareness, decreased problem solving, decreased safety awareness, decreased memory and delayed processing and decreased sitting balance, decreased standing balance, decreased postural control, hemiplegia and decreased balance strategies.  Prior to hospitalization, patient could complete ADLs with independent .  Patient will benefit from skilled intervention to decrease level of assist with basic self-care skills and increase independence with basic self-care skills prior to discharge home with care partner.  Anticipate patient will require moderate physical assestance and follow up home health.  OT - End of Session Activity Tolerance: Improving;Decreased this session Endurance Deficit: Yes OT Assessment Rehab Potential (ACUTE ONLY): Good OT Barriers to Discharge: Decreased caregiver support OT Barriers to Discharge Comments: Family will have to organize 24 hour supervision /assist OT Patient demonstrates impairments in the following area(s): Balance;Cognition;Perception;Safety;Sensory;Endurance;Motor;Vision OT Basic ADL's Functional Problem(s): Eating;Grooming;Bathing;Dressing;Toileting OT Transfers Functional Problem(s): Toilet;Tub/Shower OT Additional Impairment(s): Fuctional Use of Upper Extremity OT Plan OT Intensity: Minimum of 1-2 x/day, 45 to 90 minutes OT Frequency: 5 out of 7 days OT Duration/Estimated Length of Stay: 25-28 days OT Treatment/Interventions: Balance/vestibular training;Discharge planning;Functional electrical stimulation;Pain management;Self Care/advanced ADL retraining;Therapeutic Activities;UE/LE Coordination activities;Visual/perceptual remediation/compensation;Therapeutic Exercise;Patient/family education;Functional mobility training;Disease mangement/prevention;Cognitive  remediation/compensation;DME/adaptive equipment instruction;Neuromuscular re-education;Psychosocial support;Splinting/orthotics;UE/LE Strength taining/ROM;Wheelchair propulsion/positioning;Community reintegration OT Self Feeding Anticipated Outcome(s): supervision OT Basic Self-Care Anticipated Outcome(s): min to mod assist OT Toileting Anticipated Outcome(s): mod assist OT Bathroom Transfers Anticipated Outcome(s): min to mod assist OT Recommendation Patient destination: Home Follow Up Recommendations: Home health OT;24 hour supervision/assistance Equipment Recommended: To be determined   OT Evaluation Precautions/Restrictions  Precautions Precautions: Fall;Other (comment) Precaution Comments: L hemi, pushing tendency  with RUE to weak L side Restrictions Weight Bearing Restrictions: No General   Vital Signs Therapy Vitals Temp: 99.6 F (37.6 C) Temp Source: Oral Pulse Rate: 96 Resp: 16 BP: (!) 178/88 Patient Position (if appropriate): Lying Oxygen Therapy SpO2: 100 % Pain Pain Assessment Pain Score: 0-No pain Home Living/Prior Functioning Home Living Family/patient expects to be discharged to:: Private residence Living Arrangements: Children, Spouse/significant other Available Help at Discharge: Family, Available 24 hours/day, Other (Comment) (daughters will be assisting as well) Type of Home: House Home Access: Stairs to enter [employer] of Steps: 3-4 steps Entrance Stairs-Rails: Right, Left, Can reach both Home Layout: One level Bathroom Shower/Tub: Chiropodist: Standard Bathroom Accessibility: Yes Additional Comments: step-dad and 61 y/o son ([employer]). 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  She was able to cross midline with scanning to target on the sink, but maintains partial eye closure a lot of the time.  Will examine vision further in treatmens Perception  Perception: Impaired Inattention/Neglect: Does not attend to left side of body;Does not attend to left visual field Praxis Praxis: Impaired Praxis Impairment Details: Ideomotor;Motor planning;Perseveration Praxis-Other Comments: Pt perseverates on washing her face. Therapist had to provide max demonstrational cueing to get the washcloth out of her hand as she would not let go initially to verbal command. Cognition Overall Cognitive Status: Impaired/Different from baseline Arousal/Alertness: Awake/alert Orientation Level: Person;Place;Situation Person: Oriented Place: Oriented Situation: Oriented Year: 2021 Month: August Day of Week: Correct Memory: Impaired Memory Impairment: Storage deficit;Retrieval deficit Immediate Memory Recall: Sock;Blue;Bed Memory Recall Sock: Without Cue Memory Recall Blue: With Cue Memory Recall Bed: Without Cue Attention: Focused;Sustained Focused Attention: Impaired Focused Attention Impairment: Functional basic;Verbal basic Sustained Attention: Impaired Sustained Attention Impairment: Functional basic;Verbal basic Selective  Attention: Impaired Selective Attention Impairment: Functional basic;Verbal basic Awareness: Impaired Awareness Impairment: Emergent impairment;Intellectual impairment Problem Solving: Impaired Problem Solving Impairment: Functional basic;Verbal basic Behaviors: Perseveration Safety/Judgment: Impaired Comments: Pt with echolalia throughout session as therapist would give her instructions. Sensation Sensation Light Touch: Impaired by gross assessment Additional Comments: Pt unable to detect therapist pressing on her left hand or just proximal to the elbow.  Attention level affects pt's abilty to follow commands for sensory testing. Coordination Gross Motor Movements are Fluid and Coordinated: No Fine Motor Movements are Fluid and Coordinated: No Coordination and Movement Description: Pt with Brunnstrum stage II in the left arm and hand.  Max hand over hand assist was needed to integrate into bathing tasks. Motor  Motor Motor: Hemiplegia;Motor apraxia;Abnormal tone;Abnormal postural alignment and control Motor - Skilled Clinical Observations: moderate LUE and LLE hemiparesis  with some increased tone developing in the digits, elbow, and shoulder  Trunk/Postural Assessment  Cervical Assessment Cervical Assessment: Exceptions to  WFL (cervical rotation to the right with lateral tilt to the left) Thoracic Assessment Thoracic Assessment: Exceptions to [facility] (thoracic flexion at rest with left lateral lean) Lumbar Assessment Lumbar Assessment: Exceptions to [facility] (posterior pelvic tilt) Postural Control Postural Control: Deficits on evaluation  Balance Balance Balance Assessed: Yes Static Sitting Balance Static Sitting - Balance Support: Feet supported Static Sitting - Level of Assistance: 2: Max assist Dynamic Sitting Balance Dynamic Sitting - Balance Support: Feet supported;During functional activity Dynamic Sitting - Level of Assistance: 2: Max assist Static Standing Balance Static  Standing - Balance Support: During functional activity Static Standing - Level of Assistance: 1: +1 Total assist Extremity/Trunk Assessment RUE Assessment RUE Assessment: Within Functional Limits LUE Assessment LUE Assessment: Exceptions to [facility] General Strength Comments: Pt with Brunnstrum stage II in the arm and hand with some increased tone developing in the digit and elbow flexors as well as the shoulder internal rotators and extensors.  Max hand over hand for integration into functional tasks.  Trace digit flexion noted to command, but pt demonstrates motor planning and attention deficits so this was not consistent LUE Body System: Neuro Brunstrum levels for arm and hand: Arm;Hand Brunstrum level for arm: Stage II Synergy is developing Brunstrum level for hand: Stage II Synergy is developing LUE Tone LUE Tone: Mild  Care Tool Care Tool Self Care Eating   Eating Assist Level: Maximal Assistance - Patient 25 - 49%    Oral Care    Oral Care Assist Level: Moderate Assistance - Patient 50 - 74%    Bathing   Body parts bathed by patient: Chest;Abdomen;Face;Left arm Body parts bathed by helper: Front perineal area;Buttocks;Right upper leg;Left upper leg;Right lower leg;Left lower leg   Assist Level: Total Assistance - Patient < 25% (sit to supine)    Upper Body Dressing(including orthotics)   What is the patient wearing?: Pull over shirt   Assist Level: Total Assistance - Patient < 25%    Lower Body Dressing (excluding footwear)   What is the patient wearing?: Pants;Incontinence brief Assist for lower body dressing: Total Assistance - Patient < 25% (supine rolling)    Putting on/Taking off footwear   What is the patient wearing?: Non-skid slipper socks Assist for footwear: Dependent - Patient 0%       Care Tool Toileting Toileting activity   Assist for toileting: Total Assistance - Patient < 25% (supine in bed)     Care Tool Bed Mobility Roll left and right activity    Roll left and right assist level: Total Assistance - Patient < 25%    Sit to lying activity   Sit to lying assist level: Total Assistance - Patient < 25%    Lying to sitting edge of bed activity         Care Tool Transfers Sit to stand transfer   Sit to stand assist level: Total Assistance - Patient < 25%    Chair/bed transfer   Chair/bed transfer assist level: Total Assistance - Patient < 25% (stand pivot)     Toilet transfer         Care Tool Cognition Expression of Ideas and Wants Expression of Ideas and Wants: Frequent difficulty - frequently exhibits difficulty with expressing needs and ideas   Understanding Verbal and Non-Verbal Content Understanding Verbal and Non-Verbal Content: Sometimes understands - understands only basic conversations or simple, direct phrases. Frequently requires cues to understand   Memory/Recall Ability *first 3 days only      Refer to Care Plan  for Long Term Goals  SHORT TERM GOAL WEEK 1 OT Short Term Goal 1 (Week 1): Pt will maintain static sitting balance EOB with mod assist in preparation for transfer or selfcare tasks. OT Short Term Goal 2 (Week 1): Pt will complete UB dressing with mod assist in supported sitting following hemi dressing techniques. OT Short Term Goal 3 (Week 1): Pt will complete rollling in bed to the left with min assist and to the right with max assist in order to assist with selfcare and toileting tasks. OT Short Term Goal 4 (Week 1): Family will return demonstrate safe assist with AAROM/PROM exercises with the LUE following handout.  Recommendations for other services: None    Skilled Therapeutic Intervention ADL ADL Eating: Maximal assistance Where Assessed-Eating: Wheelchair Grooming: Moderate assistance Where Assessed-Grooming: Wheelchair Upper Body Bathing: Maximal assistance Where Assessed-Upper Body Bathing: Wheelchair Lower Body Bathing: Dependent Where Assessed-Lower Body Bathing: Bed level Upper  Body Dressing: Dependent Where Assessed-Upper Body Dressing: Wheelchair Lower Body Dressing: Dependent Where Assessed-Lower Body Dressing: Bed level Toileting: Dependent Where Assessed-Toileting: Bed level Toilet Transfer: Dependent Toilet Transfer Method: Stand pivot Toilet Transfer Equipment: Bedside commode Mobility  Bed Mobility Bed Mobility: Rolling Right;Rolling Left;Sit to Supine Rolling Right: Total Assistance - Patient < 25% Rolling Left: Total Assistance - Patient < 25% Sit to Supine: Total Assistance - Patient < 25% Transfers Stand to Sit: Total Assistance - Patient < 25%  Pt with decreased focused attention to tasks as well as echolalia throughout session.  She exhibits pusher tendencies to the left as well as cervical rotation to the right and head tilt to the left.  She perseverated throughout session on cleaning food out of her mouth and even when therapist assisted her, she still continued to focus on it.  When therapist told her to wash her face, she was able to pick up the washcloth and ring it out with the RUE only.  After washing her face several times, therapist cued her that she needed to stop in order to progress to washing other parts.  Therapist then eventually had to take the washcloth from her to get her to stop as she would not let it go.  She needed total assist to sequence through and donn her pullover shirt in supported sitting.  Completed stand pivot transfer back to the bed for changing of pants, and brief as they were both wet.  Total assist with max instructional cueing for rolling to the side, secondary to decreased initiation.  Pt was left in the bed at end of session with NT present to check vitals.  Pt's daughter [name] in the room assisting with self feeding at max assist to begin session.  She reports that she will be able to assist pt on Monday -Thursday and then her sister will be coming up from [place] to assist on other days.  Pt's spouse works and  the 30 yr old son will not be able to consistently assist per daughter.    Discharge Criteria: Patient will be discharged from OT if patient refuses treatment 3 consecutive times without medical reason, if treatment goals not met, if there is a change in medical status, if patient makes no progress towards goals or if patient is discharged from hospital.  The above assessment, treatment plan, treatment alternatives and goals were discussed and mutually agreed upon: by patient and by family  Jannah Guardiola OTR/L 05/04/2020, 5:07 PM

## 2020-05-04 NOTE — Evaluation (Signed)
Physical Therapy Assessment and Plan  Patient Details  Name: [name] MRN: [id number] Date of Birth: Dec 19, 1960  PT Diagnosis: Abnormal posture, Abnormality of gait, Cognitive deficits, Coordination disorder, Difficulty walking, Hemiplegia non-dominant, Impaired cognition, Impaired sensation and Muscle weakness Rehab Potential: Fair ELOS: 3 weeks   Today's Date: 05/04/2020 PT Individual Time: 0900-1004 PT Individual Time Calculation (min): 64 min    Hospital Problem: Principal Problem:   CVA (cerebral vascular accident) ([place]) Active Problems:   Pressure injury of skin   Thrombocytosis ([place])   Leukocytosis   AKI (acute kidney injury) ([place])   Benign essential HTN   Lethargy   Past Medical History:  Past Medical History:  Diagnosis Date  . Arthritis   . Hypertension   . Hypertension    not taking medications   Past Surgical History:  Past Surgical History:  Procedure Laterality Date  . CESAREAN SECTION      Assessment & Plan Clinical Impression: Patient is a 59 year old right-handed female with history of hypertension/CKDnot on medication. She lives with her 15 year old son and 2 adult daughters. 1 level home 2 steps to entry. Independent prior to admission working at [employer]. Presented 04/27/2020 with left hemiparesis and dysarthria. Systolic blood pressure 785 diastolic 885. Admission chemistries potassium 2.9 BUN 26 creatinine 1.81 alcohol negative urine drug screen negative. CT of the head showed hyperattenuating focus in the right thalamus age likely reflecting sequela of lacunar type infarct likely remote though in the absence of comparison overall age-indeterminate. Patient did not receive TPA. 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 Severe chronic small vessel ischemia. CT angiogram of head and neck severe right A2 segment narrowing correlating with acute infarct of the preceding MRI. 50% right cavernous ICA stenosis 55% left paraclinoid  ICA stenosis. Echocardiogram with ejection fraction 02% grade 2 diastolic dysfunction. Presently on aspirin and Plavix for CVA prophylaxis x3 weeks and aspirin alone. Subcutaneous Lovenox for DVT prophylaxis.Patient did have a mild bump in creatinine 2.21 and received a fluid bolus. Permissive hypertension closely monitoring. Therapy evaluations completed patient was admitted for a comprehensive rehab program.  Patient currently requires max/ total with mobility secondary to muscle weakness, impaired timing and sequencing, abnormal tone, unbalanced muscle activation, motor apraxia, decreased coordination and decreased motor planning, decreased visual perceptual skills and decreased visual motor skills, decreased midline orientation, decreased attention to left, left side neglect, decreased motor planning and ideational apraxia and decreased initiation, decreased attention, decreased awareness, decreased problem solving, decreased safety awareness, decreased memory and delayed processing.  Prior to hospitalization, patient was independent  with mobility and lived with Son, Significant other (step-dad) in a House home.  Home access is 3-4 stepsStairs to enter.  Patient will benefit from skilled PT intervention to maximize safe functional mobility, minimize fall risk and decrease caregiver burden for planned discharge home with 24 hour assist.  Anticipate patient will benefit from follow up [facility] at discharge.  PT - End of Session Activity Tolerance: Tolerates 30+ min activity with multiple rests Endurance Deficit: Yes Endurance Deficit Description: Lethargy progressed as mobility continued, benefits from ++ rest breaks PT Assessment Rehab Potential (ACUTE/IP ONLY): Fair PT Barriers to Discharge: [place] home environment;Home environment access/layout;Wound Care;Lack of/limited family support;Incontinence PT Barriers to Discharge Comments: 3-4 steps, LHB plegia, pusher PT Patient demonstrates  impairments in the following area(s): Balance;Behavior;Sensory;Safety;Perception;Pain;Skin Integrity;Endurance;Motor;Nutrition PT Transfers Functional Problem(s): Bed Mobility;Bed to Chair;Car PT Locomotion Functional Problem(s): Ambulation;Wheelchair Mobility;Stairs PT Plan PT Intensity: Minimum of 1-2 x/day ,45 to 90 minutes PT Frequency:  5 out of 7 days PT Duration Estimated Length of Stay: 3 weeks PT Treatment/Interventions: Ambulation/gait training;Discharge planning;Functional mobility training;Psychosocial support;Therapeutic Activities;Visual/perceptual remediation/compensation;Wheelchair propulsion/positioning;Therapeutic Exercise;Skin care/wound management;Neuromuscular re-education;Disease management/prevention;Balance/vestibular training;Cognitive remediation/compensation;DME/adaptive equipment instruction;Pain management;Splinting/orthotics;UE/LE Strength taining/ROM;UE/LE Coordination activities;Stair training;Patient/family education;Functional electrical stimulation;Community reintegration PT Transfers Anticipated Outcome(s): minA PT Locomotion Anticipated Outcome(s): minA 55f PT Recommendation Follow Up Recommendations: Home health PT Patient destination: Home Equipment Recommended: To be determined Equipment Details: Pt has: cane and standard walker   PT Evaluation Precautions/Restrictions Precautions Precautions: Fall;Other (comment) Precaution Comments: L hemi, pushing tendency with RUE to weak L side Restrictions Weight Bearing Restrictions: No General Chart Reviewed: Yes Family/Caregiver Present: Yes (Daughter - [name]) Vital Signs Pain Pain Assessment Pain Scale: 0-10 Pain Score: 0-No pain Home Living/Prior Functioning Home Living Living Arrangements: Children;Spouse/significant other Available Help at Discharge: Family;Available 24 hours/day Type of Home: House Home Access: Stairs to enter [employer] Steps: 3-4 steps Entrance  Stairs-Rails: Right;Left;Can reach both Home Layout: One level Bathroom Shower/Tub: Tub/shower unit Additional Comments: step-dad and 156y/o son ([employer]). x1 daughter is CNA (works weekends), [place]  Lives With: Son;Significant other (step-dad) Prior Function Level of Independence: Independent with basic ADLs;Independent with homemaking with ambulation;Independent with gait;Independent with transfers  Able to Take Stairs?: Yes Driving: Yes Vocation: Full time employment Vocation Requirements: [employer] Vision/Perception  Vision - Assessment Additional Comments: Noted ability to track to midline but no trackling to L when cued. Eyes closed for majority of session as fatigue progressed. Perception Perception: Impaired Inattention/Neglect: Does not attend to left visual field;Does not attend to left side of body Praxis Praxis: Impaired Praxis Impairment Details: Ideomotor;Motor planning;Perseveration;Initiation Praxis-Other Comments: verbal perseveration, delayed/decreased motor planning  Cognition Overall Cognitive Status: Impaired/Different from baseline Arousal/Alertness: Lethargic Orientation Level: Oriented X4 Attention: Sustained;Focused;Selective Focused Attention: Impaired Focused Attention Impairment: Functional basic;Verbal basic Sustained Attention: Impaired Sustained Attention Impairment: Functional basic;Verbal basic Selective Attention: Impaired Selective Attention Impairment: Functional basic;Verbal basic Memory: Impaired Problem Solving: Impaired Problem Solving Impairment: Functional basic;Verbal basic Behaviors: Restless;Perseveration Safety/Judgment: Impaired Sensation Sensation Light Touch: Impaired by gross assessment Coordination Gross Motor Movements are Fluid and Coordinated: No Fine Motor Movements are Fluid and Coordinated: No Coordination and Movement Description: Gross and fine  motor movements delayed, motor praxia Motor  Motor Motor: Motor apraxia;Abnormal postural alignment and control;Abnormal tone;Hemiplegia Motor - Skilled Clinical Observations: L HB weakness (grossly 0/5) with absent pain withdrawal in LUE. LHB tone, exaccerbated in supine   Trunk/Postural Assessment  Cervical Assessment Cervical Assessment: Exceptions to [facility](L lateral flexion, forward head) Thoracic Assessment Thoracic Assessment: Exceptions to [facility](rounded shoulders, kyphotic) Lumbar Assessment Lumbar Assessment: Exceptions to [facility](sacral sitting with posteroir pelvic tilt) Postural Control Postural Control: Deficits on evaluation (Pushing to L tendency)  Balance Balance Balance Assessed: Yes Static Sitting Balance Static Sitting - Balance Support: Feet supported Static Sitting - Level of Assistance: 2: Max assist;Other (comment) (pushing to L with RUE, delayed and decreased righting reactions despite multi-modal cueing) Dynamic Sitting Balance Dynamic Sitting - Balance Support: No upper extremity supported Dynamic Sitting - Level of Assistance: 2: Max assist Sitting balance - Comments: requires max/totalA for sitting balance despite mult-modal cueing, was able to briefly sit up with CGA with RUE to bedrail otherwise show's L pushing Static Standing Balance Static Standing - Balance Support: Right upper extremity supported;Left upper extremity supported Static Standing - Level of Assistance: 1: +1 Total assist (L knee block, pushing L) Extremity Assessment  RUE Assessment RUE Assessment: Exceptions to [facility] General Strength Comments: Grossly 4-/5 LUE Assessment LUE Assessment: Exceptions to [facility] General Strength Comments: Grossly 0/5 RLE Assessment RLE Assessment: Exceptions to [facility] General Strength Comments: Grossly 4-/5 LLE Assessment LLE Assessment: Exceptions to [facility] General Strength Comments: Grossly 0/5  Care Tool Care Tool Bed Mobility Roll left and right activity    Roll left and right assist level: 2 Helpers    Sit to lying activity   Sit to lying assist level: Maximal Assistance - Patient 25 - 49%    Lying to sitting edge of bed activity   Lying to sitting edge of bed assist level: Total Assistance - Patient < 25%     Care Tool Transfers Sit to stand transfer   Sit to stand assist level: Total Assistance - Patient < 25%    Chair/bed transfer   Chair/bed transfer assist level: Total Assistance - Patient < 25%     Psychologist, counselling transfer activity did not occur: Safety/medical concerns        Care Tool Locomotion Ambulation Ambulation activity did not occur: Safety/medical concerns (L HB weakness, pusher, L inattention.)        Walk 10 feet activity Walk 10 feet activity did not occur: Safety/medical concerns       Walk 50 feet with 2 turns activity Walk 50 feet with 2 turns activity did not occur: Safety/medical concerns      Walk 150 feet activity Walk 150 feet activity did not occur: Safety/medical concerns      Walk 10 feet on uneven surfaces activity Walk 10 feet on uneven surfaces activity did not occur: Safety/medical concerns      Stairs Stair activity did not occur: Safety/medical concerns        Walk up/down 1 step activity Walk up/down 1 step or curb (drop down) activity did not occur: Safety/medical concerns     Walk up/down 4 steps activity did not occuR: Safety/medical concerns  Walk up/down 4 steps activity      Walk up/down 12 steps activity Walk up/down 12 steps activity did not occur: Safety/medical concerns      Pick up small objects from floor Pick up small object from the floor (from standing position) activity did not occur: Safety/medical concerns      Wheelchair Will patient use wheelchair at discharge?: Yes Type of Wheelchair: Manual Wheelchair activity did not occur: Safety/medical concerns (cognitive concerns and lethargy)      Wheel 50 feet with 2 turns activity  Wheelchair 50 feet with 2 turns activity did not occur: Safety/medical concerns    Wheel 150 feet activity Wheelchair 150 feet activity did not occur: Safety/medical concerns      Refer to Care Plan for Long Term Goals  SHORT TERM GOAL WEEK 1 PT Short Term Goal 1 (Week 1): Pt will performed supine<>sit with modA PT Short Term Goal 2 (Week 1): Pt will maintain sitting EOB >48mn with modA PT Short Term Goal 3 (Week 1): Pt will perform bed<>chair transfers with modA and LRAD PT Short Term Goal 4 (Week 1): Pt will tolerate sitting in w/c for > 2 hours to demonstrate improved activity tolerance  Recommendations for other services: None   Skilled Therapeutic Intervention Mobility Bed Mobility Bed Mobility: Rolling Left;Rolling Right;Supine to Sit;Sit to Supine;Sitting - Scoot to Edge of Bed Rolling Right: 2 Helpers Rolling Left: 2 Helpers Supine to Sit: Total Assistance - Patient < 25% Sitting -  Scoot to [employer] of Bed: Dependent - Patient equal 0% Sit to Supine: Maximal Assistance - Patient 25-49% Transfers Transfers: Sit to Stand;Stand to [employer] via Lift Equipment Sit to Stand: Maximal Assistance - Patient 25-49% (L knee block) Stand to Sit: Maximal Assistance - Patient 25-49% Stand Pivot Transfers: Maximal Assistance - Patient 25 - 49% Stand Pivot Transfer Details: Verbal cues for sequencing;Verbal cues for technique;Verbal cues for precautions/safety;Visual cues/gestures for precautions/safety;Visual cues/gestures for sequencing;Manual facilitation for placement Transfer (Assistive device): None (initial stand-pivot with maxA and no transfer device. Trial of sit<>stand with Stedy requiring maxA, pushing L and decreased initiation.) Transfer via Lift Equipment: Animal nutritionist: No Gait Gait: No Stairs / Additional Locomotion Stairs: No Product manager Mobility: No  Instructed pt in results of PT evaluation as detailed  above, PT POC, rehab potential, rehab goals, and discharge recommendations. Additionally discussed CIR's policies regarding fall safety and use of chair alarm and/or quick release belt. Pt verbalized understanding and in agreement. Will update pt's family members as they become available.   Discharge Criteria: Patient will be discharged from PT if patient refuses treatment 3 consecutive times without medical reason, if treatment goals not met, if there is a change in medical status, if patient makes no progress towards goals or if patient is discharged from hospital.  The above assessment, treatment plan, treatment alternatives and goals were discussed and mutually agreed upon: by patient and by family  Alger Simons PT 05/04/2020, 12:37 PM

## 2020-05-04 NOTE — Progress Notes (Signed)
Patient information reviewed and entered into [facility] System by Becky Jacalyn Biggs, PPS coordinator. Information including medical coding, function ability, and quality indicators will be reviewed and updated through discharge.   

## 2020-05-04 NOTE — Progress Notes (Addendum)
[facility] PHYSICAL MEDICINE & REHABILITATION PROGRESS NOTE  Subjective/Complaints: Patient seen laying in bed this morning.  She states she slept well overnight.  She is about to work with therapies.  She is slightly lethargic and perseverative.  ROS: Denies CP, SOB, N/V/D  Objective: Vital Signs: Blood pressure (!) 163/79, pulse 99, temperature 99 F (37.2 C), temperature source Oral, resp. rate 18, height 5\' 3"  (1.6 m), weight 67.2 kg, last menstrual period 09/15/2013, SpO2 100 %. CT HEAD WO CONTRAST  Result Date: 05/02/2020 CLINICAL DATA:  Status post right ACA infarct EXAM: CT HEAD WITHOUT CONTRAST TECHNIQUE: Contiguous axial images were obtained from the base of the skull through the vertex without intravenous contrast. COMPARISON:  04/27/2020 FINDINGS: Brain: Geographic area of decreased attenuation is noted in the distribution of the distal aspect of the right anterior cerebral artery similar to that seen on recent MRI consistent with subacute infarct. Basal ganglia calcifications are seen. Mild chronic white matter ischemic changes are seen as well. Stable right thalamic lacunar infarct is noted as well. No acute hemorrhage or new focal infarct is seen. Vascular: No hyperdense vessel or unexpected calcification. Skull: Normal. Negative for fracture or focal lesion. Sinuses/Orbits: Mucosal retention cyst is noted in the left maxillary antrum stable from the prior study. Other: None. IMPRESSION: Geographic area of decreased attenuation in the distribution of the right anterior cerebral artery consistent with the ischemia seen on recent MRI. No new focal abnormality is seen. Electronically Signed   By: Inez Catalina M.D.   On: 05/02/2020 17:56   Recent Labs    05/03/20 1634 05/04/20 0550  WBC 12.6* 11.2*  HGB 10.1* 10.1*  HCT 31.9* 31.6*  PLT 155 149*   Recent Labs    05/03/20 0309 05/03/20 0309 05/03/20 1634 05/04/20 0550  NA 139  --   --  138  K 3.7  --   --  3.5  CL 104  --    --  106  CO2 23  --   --  22  GLUCOSE 102*  --   --  109*  BUN 44*  --   --  36*  CREATININE 2.21*   < > 2.03* 1.88*  CALCIUM 9.5  --   --  9.1   < > = values in this interval not displayed.    Physical Exam: BP (!) 163/79 (BP Location: Left Arm)   Pulse 99   Temp 99 F (37.2 C) (Oral)   Resp 18   Ht 5\' 3"  (1.6 m)   Wt 67.2 kg   LMP 09/15/2013   SpO2 100%   BMI 26.24 kg/m  Constitutional: No distress . Vital signs reviewed. HENT: Normocephalic.  Atraumatic. Eyes: EOMI. No discharge. Cardiovascular: No JVD. RRR. +Murmur. Respiratory: Normal effort.  No stridor. Bilaterally clear to ausculation.  GI: Non-distended. BS+. Skin: Warm and dry.     Psych: Perseverative, delayed. Musc: No edema in extremities.  No tenderness in extremities. Neuro: Somnolent and oriented, except for date of month. Left facial weakness Dysarthria Motor: Limited due to participation, but appears to be 0/5 LUE/LLE Moving right upper extremity freely Some movement noted in right lower extremity  Assessment/Plan: 1. 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  Physiatrist is providing close team supervision and 24 hour management of active medical problems listed below.  Physiatrist and rehab team continue to assess barriers to discharge/monitor patient progress toward functional  and medical goals  Care Tool:  Bathing              Bathing assist       Upper Body Dressing/Undressing Upper body dressing        Upper body assist      Lower Body Dressing/Undressing Lower body dressing            Lower body assist       Toileting Toileting    Toileting assist       Transfers Chair/bed transfer  Transfers assist           Locomotion Ambulation   Ambulation assist              Walk 10 feet activity   Assist           Walk 50 feet  activity   Assist           Walk 150 feet activity   Assist           Walk 10 feet on uneven surface  activity   Assist           Wheelchair     Assist               Wheelchair 50 feet with 2 turns activity    Assist            Wheelchair 150 feet activity     Assist            Medical Problem List and Plan: 1.Left side weakness and dysarthriasecondary to right ACA infarction  Begin CIR evaluation  Team conference today to discuss current and goals and coordination of care, home and environmental barriers, and discharge planning with nursing, case manager, and therapies. Please see conference note from today as well.  2. Antithrombotics: -DVT/anticoagulation:Lovenox -antiplatelet therapy: Aspirin 325 mg daily and Plavix 75 mg daily x3 weeks then aspirin alone 3. Pain Management:Tylenol as needed 4. Mood:Provide emotional support  Amantadine with breakfast and lunch started on 8/25 -antipsychotic agents: N/A 5. Neuropsych: This patientis ?capable of making decisions on herown behalf. 6. Skin/Wound Care:Routine skin checkss  Prevalon boots ordered 7. Fluids/Electrolytes/Nutrition:Routine in and outs 8. Hypertension:   Hydralazine 75 mg every 8 hours, Norvasc 10 mg daily.   Monitor with increased mobility 9. Hyperlipidemia. Continue Lipitor 10. AKI on CKD. Normal renal function 2017 but no recent visits, does not have a PCP.   Cr. 1.88 on 8/25 11. Leukocytosis- U/A (-)  WBCs 11.2 on 8/25  Afebrile 12. Thrombocytosis  Plts 149  Cont to monitor  LOS: 1 days A FACE TO FACE EVALUATION WAS PERFORMED  Avenell Sellers Lorie Phenix 05/04/2020, 8:56 AM

## 2020-05-04 NOTE — Progress Notes (Signed)
Patient Details  Name: [name] MRN: [id number] Date of Birth: 1960-10-19  Today's Date: 05/04/2020  Hospital Problems: Principal Problem:   CVA (cerebral vascular accident) [facility]) Active Problems:   Pressure injury of skin   Thrombocytosis (HCC)   Leukocytosis   AKI (acute kidney injury) ([place])   Benign essential HTN  Past Medical History:  Past Medical History:  Diagnosis Date  . Arthritis   . Hypertension   . Hypertension    not taking medications   Past Surgical History:  Past Surgical History:  Procedure Laterality Date  . CESAREAN SECTION     Social History:  reports that she has never smoked. She has never used smokeless tobacco. She reports that she does not drink alcohol and does not use drugs.  Family / Support Systems Marital Status: Single Patient Roles: Partner, Parent, Other (Comment) (employee) Spouse/Significant Other: Marchia Bond [phone] Children: [name]-daughter [phone] Other Supports: Another daughter lives n [place] and 19 yo son-Jody in the home Anticipated Caregiver: Marchia Bond and [name] Ability/Limitations of Caregiver: [name] is a Quarry manager and works at [employer] on the weekends, Marchia Bond works during the week. Her other daughter can come and assist on the weekends from [place] Availability: 24/7 Family Dynamics: Very close family who will pull together to assist pt at home. Pt is hopeful she will do well here and recover from this stroke. Has good social supports via co-workers and friends  Social History Preferred language: English Religion: None Cultural Background: No issues Education: HS Read: Yes Write: Yes Employment Status: Employed Name of Employer: Scientist, research (medical) of Employment: 2 Return to Work Plans: Unsure at this time-did retire from [employer] 2 years ago Public relations account executive Issues: No issues Guardian/Conservator: None-according to MD pt is not fully capable of  making her own decisions while here, will look toward [place] due to adult child and pt not married to [place], until pt is recovers.   Abuse/Neglect Abuse/Neglect Assessment Can Be Completed: Yes Physical Abuse: Denies Verbal Abuse: Denies Sexual Abuse: Denies Exploitation of patient/patient's resources: Denies Self-Neglect: Denies  Emotional Status Pt's affect, behavior and adjustment status: Pt is tired from therapies, but is motivated to do well. She has always been very active and independent prior to this happening. She does not like to depend upon others she is one who usually helps others. Recent Psychosocial Issues: other health issues-aware needed HTN meds. Agreeable to obtaining PCP prior to DC from [facility] Psychiatric History: No history pt may benefit from seeing neuro-psych while here due to how independent she was PTA Substance Abuse History: Tobacco aware needs to quit smoking for her health, aware of the resources available to her for this  Patient / Family Perceptions, Expectations & Goals Pt/Family understanding of illness & functional limitations: Pt and daughter can explain she had a CVA and her deficits. [name] has a good understandng of her MOm's deficits due to she works with this population at the [place]. 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, friend, etc Anticipated changes in roles/activities/participation: resume Pt/family expectations/goals: Pt states: " I want to get better and move myself."  [name] states: " We will assist her and do what she needs, she would for [place]."  [employer]: None Premorbid Home Care/DME Agencies: None Transportation available at discharge: Self now family will assist with this Resource referrals recommended: Neuropsychology  Discharge Planning Living Arrangements: Children, Spouse/significant other Support Systems:  Spouse/significant other,  Children, Friends/neighbors Type of Residence: Private residence Insurance Resources: Multimedia programmer (specify) ([facility]) Financial Resources: Employment, Secondary school teacher Screen Referred: No Living Expenses: Education officer, community Management: Patient, Significant Other Does the patient have any problems obtaining your medications?: No Home Management: Patient did this prior to admission-family members will do now Patient/Family Preliminary Plans: Return home with Marchia Bond and [employer] assisting with her care. [name] aware she will need assist at DC but remains hopeful. Made aware of team conference goals and beng evaluated today. Care Coordinator Anticipated Follow Up Needs: HH/OP  Clinical Impression Pleasant unfortunate female who has had a significant CVA. She has good family support and they will provide 24 hr care at DC. [name]-daughter plans to be here to observe and see her progress. Will work on discharge needs, pt will benefit from seeing neuro-psych while here.  Elease Hashimoto 05/04/2020, 10:39 AM

## 2020-05-04 NOTE — Patient Care Conference (Signed)
Inpatient RehabilitationTeam Conference and Plan of Care Update Date: 05/04/2020   Time: 11:10  AM    Patient Name: [name]      Medical Record Number: [id number]  Date of Birth: 1960-09-29 Sex: Female         Room/Bed: 4M07C/4M07C-01 Payor Info: Payor: [employer] EMPLOYEE / Plan: [place] [employer] / Product Type: *No Product type* /    Admit Date/Time:  05/03/2020  3:56 PM  Primary Diagnosis:  CVA (cerebral vascular accident) [facility])  Hospital Problems: Principal Problem:   CVA (cerebral vascular accident) ([place]) Active Problems:   Pressure injury of skin   Thrombocytosis (HCC)   Leukocytosis   AKI (acute kidney injury) ([place])   Benign essential HTN   Lethargy    Expected Discharge Date: Expected Discharge Date:  (3 weeks LOS)  Team Members Present: Physician leading conference: Dr. Delice Lesch Care Coodinator Present: Other (comment);Dorien Chihuahua, RN, BSN, CRRN (Becky Dupree, SW) PT Present: Other (comment) Darrick Meigs [place], PT) OT Present: Clyda Greener, OT SLP Present: Jettie Booze, CF-SLP PPS Coordinator present : Ileana Ladd, Burna Mortimer, SLP     Current Status/Progress Goal Weekly Team Focus  Bowel/Bladder   Pt is incontinent of bowel and bladder. LBM-08/24  To become more continent.  Assess tolieting needs.   Swallow/Nutrition/ Hydration             ADL's             Mobility   max/totalA  minA goals  sitting balance, pushers, transfers, pre-gait training   Communication             Safety/Cognition/ Behavioral Observations            Pain   No complaints of pain.  To remain pain free.  Assess pain q shift or prn.   Skin   Has pressure injury to coccyx covered with a foam. Ecchymosis to right arm also.  To promote healing and further skin breakdown.  Assess skin q shift or prn.     Discharge Planning:  HOme with two daughter's and significant other assisting. 59 yo son also at home. New eval today   Team Discussion: OT eval pending.  Decreased alertness, poor attention, dysarthria, decreased cognition and persevoration impairing progression. Mobility affected by left hemiparesis, no movement noted in left side,, pushes to the left during activity and poor initiation affecting transfers. Swallowing affected by pocketing and clearance issues. Diet set for D2 thin for now. Incontinent of bowel and bladder and stafe 2 on coccyx.  Patient on target to meet rehab goals: yes  *See Care Plan and progress notes for long and short-term goals.   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  Barriers to Discharge: Behavior;Medical stability   Possible Resolutions to [employer] Focus: Therapies, optimize BP meds, follow labs, further workup as necessary   Continued Need for Acute Rehabilitation Level of Care: The patient requires daily medical management by a physician with specialized training in physical medicine and rehabilitation for the following reasons:  Direction of a multidisciplinary physical rehabilitation program to maximize functional independence : Yes Medical management of patient stability for increased activity during participation in an intensive rehabilitation regime.: Yes Analysis of laboratory values and/or radiology reports with any subsequent need for medication adjustment and/or medical intervention. : Yes   I attest that I was present, lead the team conference, and  concur with the assessment and plan of the team.   Dorien Chihuahua B 05/04/2020, 3:34 PM

## 2020-05-04 NOTE — Progress Notes (Signed)
[facility] Individual Statement of Services  Patient Name:  [name]  Date:  05/04/2020  Welcome to the [facility].  Our goal is to provide you with an individualized program based on your diagnosis and situation, designed to meet your specific needs.  With this comprehensive rehabilitation program, you will be expected to participate in at least 3 hours of rehabilitation therapies Monday-Friday, with modified therapy programming on the weekends.  Your rehabilitation program will include the following services:  Physical Therapy (PT), Occupational Therapy (OT), Speech Therapy (ST), 24 hour per day rehabilitation nursing, Neuropsychology, Care Coordinator, Rehabilitation Medicine, Nutrition Services and Pharmacy Services  Weekly team conferences will be held on Wednesday to discuss your progress.  Your Inpatient Rehabilitation Care Coordinator will talk with you frequently to get your input and to update you on team discussions.  Team conferences with you and your family in attendance may also be held.  Expected length of stay: 25-28 days  Overall anticipated outcome: min-mod level of assist  Depending on your progress and recovery, your program may change. Your Inpatient Rehabilitation Care Coordinator will coordinate services and will keep you informed of any changes. Your Inpatient Rehabilitation Care Coordinator's name and contact numbers are listed  below.  The following services may also be recommended but are not provided by the [facility] will be made to provide these services after discharge if needed.  Arrangements include referral to agencies that provide these services.  Your insurance has been verified to be:  Campus primary doctor is:  None-will need one  prior to DC  Pertinent information will be shared with your doctor and your insurance company.  Inpatient Rehabilitation Care Coordinator:  Ovidio Kin, [place] or Emilia Beck  Information discussed with and copy given to patient by: Elease Hashimoto, 05/04/2020, 10:41 AM

## 2020-05-05 ENCOUNTER — Inpatient Hospital Stay (HOSPITAL_COMMUNITY): Payer: 59 | Admitting: Speech Pathology

## 2020-05-05 ENCOUNTER — Inpatient Hospital Stay (HOSPITAL_COMMUNITY): Payer: 59

## 2020-05-05 ENCOUNTER — Inpatient Hospital Stay (HOSPITAL_COMMUNITY): Payer: 59 | Admitting: Occupational Therapy

## 2020-05-05 MED ORDER — HYDRALAZINE HCL 50 MG PO TABS
100.0000 mg | ORAL_TABLET | Freq: Three times a day (TID) | ORAL | Status: DC
  Administered 2020-05-05 – 2020-06-01 (×80): 100 mg via ORAL
  Filled 2020-05-05 (×81): qty 2

## 2020-05-05 NOTE — Plan of Care (Signed)
  Problem: Consults Goal: RH STROKE PATIENT EDUCATION Description: See Patient Education module for education specifics  Outcome: Progressing Goal: Nutrition Consult-if indicated Outcome: Progressing Goal: Diabetes Guidelines if Diabetic/Glucose > 140 Description: If diabetic or lab glucose is > 140 mg/dl - Initiate Diabetes/Hyperglycemia Guidelines & Document Interventions  Outcome: Progressing   Problem: RH BOWEL ELIMINATION Goal: RH STG MANAGE BOWEL WITH ASSISTANCE Description: STG Manage Bowel with Mod I Assistance. Outcome: Progressing Goal: RH STG MANAGE BOWEL W/MEDICATION W/ASSISTANCE Description: STG Manage Bowel with Medication with Mod I Assistance. Outcome: Progressing   Problem: RH BLADDER ELIMINATION Goal: RH STG MANAGE BLADDER WITH ASSISTANCE Description: STG Manage Bladder With Mod I Assistance Outcome: Progressing   Problem: RH SKIN INTEGRITY Goal: RH STG SKIN FREE OF INFECTION/BREAKDOWN Description: Mod I Outcome: Progressing   Problem: RH SAFETY Goal: RH STG ADHERE TO SAFETY PRECAUTIONS W/ASSISTANCE/DEVICE Description: STG Adhere to Safety Precautions With Mod I Assistance/Device. Outcome: Progressing   Problem: RH PAIN MANAGEMENT Goal: RH STG PAIN MANAGED AT OR BELOW PT'S PAIN GOAL Description: <2 Outcome: Progressing   Problem: RH KNOWLEDGE DEFICIT Goal: RH STG INCREASE KNOWLEDGE OF HYPERTENSION Description: Patient will be able to manage HTN with medications and dietary restrictions using handouts and educational information independently Outcome: Progressing Goal: RH STG INCREASE KNOWLEDGE OF STROKE PROPHYLAXIS Description: Patient will acknowledge understanding of secondary stroke risks and management with stroke prophylaxis medications using handouts and educational materials independently Outcome: Progressing

## 2020-05-05 NOTE — Progress Notes (Addendum)
Speech Language Pathology Daily Session Note  Patient Details  Name: [name] MRN: [id number] Date of Birth: Sep 16, 1960  Today's Date: 05/05/2020 SLP Individual Time: 1300-1356 SLP Individual Time Calculation (min): 56 min  Short Term Goals: Week 1: SLP Short Term Goal 1 (Week 1): Pt will follow 1 step commands in functional tasks with mod A multimodal cues. SLP Short Term Goal 2 (Week 1): Pt will self-monitor and self-correct functional and verbal errors with max A multimodal cues. SLP Short Term Goal 3 (Week 1): Pt will demonstrate alertness and sustained attention in 5 minute intervals with mod A verbal cues for redirection. SLP Short Term Goal 4 (Week 1): Pt will demonstrate 80% intelligibility at the phrase level with min A verbal cues to increase vocal intensity and slow rate. SLP Short Term Goal 5 (Week 1): Pt will consume dys 2 textures and thin liquids with min A verbal cues for swallow strategies and minimal overt s/s aspiration.  Skilled Therapeutic Interventions: Pt was seen for skilled ST targeting dysphagia and cognitive goals. SLP provided Mod A for self feeding and use of strategies to clear oral residue and left buccal pocketing of Dysphagia 2 (minced/ground) lunch solids from her tray. No overt s/sx aspiration noted. Moderate verbal cues also required for pt to follow commands to open mouth for SLP to check for residue. Encouraged pt to complete oral care at end of meal given that SLP could not fully visualize inside of oral cavity and concern for residue. Min residual PO expectorated when rinsing mouth after oral care and pt perseverative on picking her teeth throughout session. Recommend pt continue current diet but oral care should be completed after each meal to ensure oral clearance. SLP further facilitated session with administration of SLUMS as diagnostic treatment given that no formal/standardized evaluation could be completed yesterday. Pt scored 15/30, which is  indicative of severe neurocognitive impairments, most remarkable for reduced attention to tasks, mildly complex problem solving, mild recall deficits, and severe executive function and visual-spatial impairments. Pt communicated wants and needs sufficiently and no word finding impairments noted, however pt highly perseverative on words and phrases throughout session and semantic fluency also impaired during timed generative naming tasks. Pt recalled 3/5 words independently in delayed recall task, category cues provided for recall of remaining 2 words. Immediate recall at paragraph level was excellent. Pt demonstrated use of call bell to request assistance with Min A verbal cues/prompts. Pt left laying in bed with alarm set and needs within reach. Continue per current plan of care.       Pain Pain Assessment Pain Scale: 0-10 Pain Score: 0-No pain  Therapy/Group: Individual Therapy  Arbutus Leas 05/05/2020, 2:07 PM

## 2020-05-05 NOTE — Progress Notes (Signed)
[facility] PHYSICAL MEDICINE & REHABILITATION PROGRESS NOTE  Subjective/Complaints: RN, Olivia Mackie, and PT are encouraging patient to swallow her meds.  BP high to 179/82- will increase hydralazine to 100 TID, discussed with Caryl Pina RN May dc IV  ROS: Denies CP, SOB, N/V/D  Objective: Vital Signs: Blood pressure (!) 179/82, pulse 94, temperature 98.2 F (36.8 C), resp. rate 16, height 5\' 3"  (1.6 m), weight 67.2 kg, last menstrual period 09/15/2013, SpO2 98 %. No results found. Recent Labs    05/03/20 1634 05/04/20 0550  WBC 12.6* 11.2*  HGB 10.1* 10.1*  HCT 31.9* 31.6*  PLT 155 149*   Recent Labs    05/03/20 0309 05/03/20 0309 05/03/20 1634 05/04/20 0550  NA 139  --   --  138  K 3.7  --   --  3.5  CL 104  --   --  106  CO2 23  --   --  22  GLUCOSE 102*  --   --  109*  BUN 44*  --   --  36*  CREATININE 2.21*   < > 2.03* 1.88*  CALCIUM 9.5  --   --  9.1   < > = values in this interval not displayed.    Physical Exam: BP (!) 179/82 (BP Location: Right Arm)   Pulse 94   Temp 98.2 F (36.8 C)   Resp 16   Ht 5\' 3"  (1.6 m)   Wt 67.2 kg   LMP 09/15/2013   SpO2 98%   BMI 26.24 kg/m   General: Alert and oriented x 3, No apparent distress HEENT: Head is normocephalic, atraumatic, PERRLA, EOMI, sclera anicteric, oral mucosa pink and moist, dentition intact, ext ear canals clear,  Neck: Supple without JVD or lymphadenopathy Heart: Reg rate and rhythm. +Murmur Respiratory: Normal effort.  No stridor. Bilaterally clear to ausculation.  GI: Non-distended. BS+. Skin: Warm and dry. IV in place     Psych: Perseverative, delayed. Musc: No edema in extremities.  No tenderness in extremities. Neuro: Somnolent and oriented, except for date of month. Left facial weakness Dysarthria Motor: Limited due to participation, but appears to be 0/5 LUE/LLE Moving right upper extremity freely Some movement noted in right lower extremity  Assessment/Plan: 1. Functional deficits  secondary to right ACA infarction which require 3+ hours per day of interdisciplinary therapy in a comprehensive inpatient rehab setting.  Physiatrist is providing close team supervision and 24 hour management of active medical problems listed below.  Physiatrist and rehab team continue to assess barriers to discharge/monitor patient progress toward functional and medical goals  Care Tool:  Bathing    Body parts bathed by patient: Chest, Abdomen, Face, Left arm   Body parts bathed by helper: Front perineal area, Buttocks, Right upper leg, Left upper leg, Right lower leg, Left lower leg     Bathing assist Assist Level: Total Assistance - Patient < 25% (sit to supine)     Upper Body Dressing/Undressing Upper body dressing   What is the patient wearing?: Pull over shirt    Upper body assist Assist Level: Total Assistance - Patient < 25%    Lower Body Dressing/Undressing Lower body dressing      What is the patient wearing?: Incontinence brief     Lower body assist Assist for lower body dressing: 2 Helpers     Toileting Toileting    Toileting assist Assist for toileting: Total Assistance - Patient < 25%     Transfers Chair/bed transfer  Transfers assist  Chair/bed transfer assist level: Total Assistance - Patient < 25% (stand pivot)     Locomotion Ambulation   Ambulation assist   Ambulation activity did not occur: Safety/medical concerns (L HB weakness, pusher, L inattention.)          Walk 10 feet activity   Assist  Walk 10 feet activity did not occur: Safety/medical concerns        Walk 50 feet activity   Assist Walk 50 feet with 2 turns activity did not occur: Safety/medical concerns         Walk 150 feet activity   Assist Walk 150 feet activity did not occur: Safety/medical concerns         Walk 10 feet on uneven surface  activity   Assist Walk 10 feet on uneven surfaces activity did not occur: Safety/medical  concerns         Wheelchair     Assist Will patient use wheelchair at discharge?: Yes Type of Wheelchair: Manual Wheelchair activity did not occur: Safety/medical concerns (cognitive concerns and lethargy)         Wheelchair 50 feet with 2 turns activity    Assist    Wheelchair 50 feet with 2 turns activity did not occur: Safety/medical concerns       Wheelchair 150 feet activity     Assist Wheelchair 150 feet activity did not occur: Safety/medical concerns          Medical Problem List and Plan: 1.Left side weakness and 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 Antithrombotics: -DVT/anticoagulation:Lovenox -antiplatelet therapy: Aspirin 325 mg daily and Plavix 75 mg daily x3 weeks then aspirin alone 3. Pain Management:Tylenol as needed. Well controlled 4. Mood:Provide emotional support  Amantadine with breakfast and lunch started on 8/25 -antipsychotic agents: N/A 5. Neuropsych: This patientis ?capable of making decisions on herown behalf. 6. Skin/Wound Care:Routine skin checks. May d/c IV- order placed  Prevalon boots ordered 7. Fluids/Electrolytes/Nutrition:Routine in and outs 8. Hypertension:   Hydralazine - increased to 100mg  TID on 8/26, Norvasc 10 mg daily.   Monitor with increased mobility 9. Hyperlipidemia. Continue Lipitor 10. AKI on CKD. Normal renal function 2017 but no recent visits, does not have a PCP.   Cr. 1.88 on 8/25 11. Leukocytosis- U/A (-)  WBCs 11.2 on 8/25  Afebrile 12. Thrombocytosis  Plts 149  Cont to monitor  LOS: 2 days A FACE TO FACE EVALUATION WAS PERFORMED  Clide Deutscher Mykiah Schmuck 05/05/2020, 8:49 AM

## 2020-05-05 NOTE — Progress Notes (Signed)
Physical Therapy Session Note  Patient Details  Name: [name] MRN: [id number] Date of Birth: May 13, 1961  Today's Date: 05/05/2020 PT Individual Time: 0800-0900 PT Individual Time Calculation (min): 60 min   Short Term Goals: Week 1:  PT Short Term Goal 1 (Week 1): Pt will performed supine<>sit with modA PT Short Term Goal 2 (Week 1): Pt will maintain sitting EOB >39min with modA PT Short Term Goal 3 (Week 1): Pt will perform bed<>chair transfers with modA and LRAD PT Short Term Goal 4 (Week 1): Pt will tolerate sitting in w/c for > 2 hours to demonstrate improved activity tolerance  Skilled Therapeutic Interventions/Progress Updates:    Pt received supine in bed, sleeping on arrival, requires extra time and effort for waking and maintaining adequate alertness for participation. Donned pants with totalA while pt supine in bed for time management, attempting bridging for pulling up pants but pt unable to adequately lift hips. Performed supine>sit with max/totalA with HOB flat. Pt required max/totalA for maintaining short sitting EOB as she push's to the L with RUE. Required totalA for foot placement while sitting. Delayed righting reactions with LOB's and pt frequently verbally perverating on commands that were given by PT. Pt was briefly able to maintain sitting EOB with minA while RUE was on grab bar however limited carryover with efforts. Performed lateral elbow leans 1x15 bilaterally to attempt improving midline orientation and braking pushing tendency, again limited carryover. Performed squat-pivot transfer towards R side with maxA from EOB to w/c. Placed patient in front of her mirror in her bedroom where she performed x4 sit<>stands with maxA +2 from w/c height to stedy, push's heavy to the L in standing. Was able to maintain standing for a few minutes each stand but required maxA for upright/midline shift. RN present periodically throughout session for medication, pt requires a  considerable amount of time to swallow her water/crushed meds. Therapist attempting at correcting her poor cervical posture (L lateral tilt) but limited carryover. Pt ended session in her w/c, pillows required to support L hip to promote midline. Seated belt alarm on, needs in reach.  Therapy Documentation Precautions:  Precautions Precautions: Fall, Other (comment) Precaution Comments: L hemi, pushing tendency with RUE to weak L side Restrictions Weight Bearing Restrictions: No  Pain: Pain Assessment Pain Scale: 0-10 Pain Score: 0-No pain   Therapy/Group: Individual Therapy  Pearlina Friedly P Teshia Mahone PT 05/05/2020, 10:46 AM

## 2020-05-05 NOTE — Progress Notes (Signed)
Occupational Therapy Session Note  Patient Details  Name: [name] MRN: [id number] Date of Birth: 03/09/1961  Today's Date: 05/05/2020 OT Individual Time: 8366-2947 OT Individual Time Calculation (min): 60 min    Short Term Goals: Week 1:  OT Short Term Goal 1 (Week 1): Pt will maintain static sitting balance EOB with mod assist in preparation for transfer or selfcare tasks. OT Short Term Goal 2 (Week 1): Pt will complete UB dressing with mod assist in supported sitting following hemi dressing techniques. OT Short Term Goal 3 (Week 1): Pt will complete rollling in bed to the left with min assist and to the right with max assist in order to assist with selfcare and toileting tasks. OT Short Term Goal 4 (Week 1): Family will return demonstrate safe assist with AAROM/PROM exercises with the LUE following handout.  Skilled Therapeutic Interventions/Progress Updates:    Pt sitting up in TIS w/c with eyes closed, pt verbally repeating OT throughout session.  Pt c/o pain in low back intermittently.  Pt transported to sinkside for bathing and dressing.  Pt needing total assist to doff shirt and initially needing assist to maintain upright head position due to left lateral posturing.  Max VCs provided to facilitate visual attending to pts posture in mirror.  Pt intermittently attending to mirror, however easily distracted.  Pt bathed UB with max assist and max simple VCs for sequencing and prevention of perseveration.  Manual assist needed to lift LUE for pt to wash under arm.  Provided hand over hand of LUE to bath right arm.  Pt completed sit to stand with total assist +2 while blocking LLE into extension.  Total assist to doff pants.  Pt bathed tops of BLE and right foot with max assist to maintain figure 4.  Total assist +2 to donn pants in standing.  Max VCs needed to facilitate increased upright posture due to left sided leaning.  Pt brushed teeth with min assist to place toothpaste on  toothbrush and VCs for hemi technique and progression of sequence.  Pt over reaching when trying to place toothpaste on toothbrush.  Pt reporting she needs to "pee".  Pt transported bedside and total assist +2 needed for sliding board transfer to EOB. Pt needing total assist sit to supine.  Total assist needed to roll right and max assist to roll left for dependent clothing mgt and pericare.  Incontinent episode of urine noted in brief.  Pt requesting to donn pants needing total dependence.  Pt fell asleep during LB dressing bed level with OT unable to arouse, therefore discontinued remainder of session.  Call bell in reach, bed alarm on.  Missed 15 minutes of treatment.    Therapy Documentation Precautions:  Precautions Precautions: Fall, Other (comment) Precaution Comments: L hemi, pushing tendency with RUE to weak L side Restrictions Weight Bearing Restrictions: No   Therapy/Group: Individual Therapy  Ezekiel Slocumb 05/05/2020, 12:02 PM

## 2020-05-06 ENCOUNTER — Inpatient Hospital Stay (HOSPITAL_COMMUNITY): Payer: 59 | Admitting: Occupational Therapy

## 2020-05-06 ENCOUNTER — Inpatient Hospital Stay (HOSPITAL_COMMUNITY): Payer: 59 | Admitting: Speech Pathology

## 2020-05-06 ENCOUNTER — Inpatient Hospital Stay (HOSPITAL_COMMUNITY): Payer: 59

## 2020-05-06 DIAGNOSIS — I639 Cerebral infarction, unspecified: Secondary | ICD-10-CM

## 2020-05-06 DIAGNOSIS — I69391 Dysphagia following cerebral infarction: Secondary | ICD-10-CM

## 2020-05-06 NOTE — Progress Notes (Addendum)
[place] PHYSICAL MEDICINE & REHABILITATION PROGRESS NOTE  Subjective/Complaints: Patient seen sitting up.  Chair this morning.  Daughter at bedside.  Patient states she did not sleep well overnight due to feeling claustrophobic.  Daughter notes history of claustrophobia.  ROS: Denies CP, SOB, N/V/D  Objective: Vital Signs: Blood pressure (!) 166/81, pulse 94, temperature 98.9 F (37.2 C), resp. rate 19, height 5\' 3"  (1.6 m), weight 67.2 kg, last menstrual period 09/15/2013, SpO2 99 %. No results found. Recent Labs    05/03/20 1634 05/04/20 0550  WBC 12.6* 11.2*  HGB 10.1* 10.1*  HCT 31.9* 31.6*  PLT 155 149*   Recent Labs    05/03/20 1634 05/04/20 0550  NA  --  138  K  --  3.5  CL  --  106  CO2  --  22  GLUCOSE  --  109*  BUN  --  36*  CREATININE 2.03* 1.88*  CALCIUM  --  9.1    Physical Exam: BP (!) 166/81 (BP Location: Right Arm)   Pulse 94   Temp 98.9 F (37.2 C)   Resp 19   Ht 5\' 3"  (1.6 m)   Wt 67.2 kg   LMP 09/15/2013   SpO2 99%   BMI 26.24 kg/m  Constitutional: No distress . Vital signs reviewed. HENT: Normocephalic.  Atraumatic. Eyes: EOMI. No discharge. Cardiovascular: No JVD.  RRR.  + Murmur. Respiratory: Normal effort.  No stridor.  Bilateral clear to auscultation. GI: Non-distended.  BS +. Skin: Warm and dry.  Intact. Psych: Perseverative, slowed Musc: No edema in extremities.  No tenderness in extremities. Neuro: Somnolent Left facial weakness Dysarthria, unchanged Motor: LUE/LE: 0/5 proximal distal Increase in tone noted left side  Assessment/Plan: 1. 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  Physiatrist is providing close team supervision and 24 hour management of active medical problems listed below.  Physiatrist and rehab team continue to assess barriers to discharge/monitor patient progress toward functional and  medical goals  Care Tool:  Bathing    Body parts bathed by patient: Left arm, Chest, Abdomen, Right upper leg, Left upper leg, Face   Body parts bathed by helper: Right arm, Right lower leg, Left lower leg, Buttocks, Front perineal area     Bathing assist Assist Level: Maximal Assistance - Patient 24 - 49%     Upper Body Dressing/Undressing Upper body dressing   What is the patient wearing?: Pull over shirt    Upper body assist Assist Level: Total Assistance - Patient < 25%    Lower Body Dressing/Undressing Lower body dressing      What is the patient wearing?: Incontinence brief     Lower body assist Assist for lower body dressing: 2 Helpers     Toileting Toileting    Toileting assist Assist for toileting: Total Assistance - Patient < 25%     Transfers Chair/bed transfer  Transfers assist     Chair/bed transfer assist level: Total Assistance - Patient < 25% (stand pivot)     Locomotion Ambulation   Ambulation assist   Ambulation activity did not occur: Safety/medical concerns (L HB weakness, pusher, L inattention.)          Walk 10 feet activity   Assist  Walk 10 feet activity did not occur: Safety/medical concerns        Walk 50 feet activity   Assist Walk 50 feet with 2 turns activity  did not occur: Safety/medical concerns         Walk 150 feet activity   Assist Walk 150 feet activity did not occur: Safety/medical concerns         Walk 10 feet on uneven surface  activity   Assist Walk 10 feet on uneven surfaces activity did not occur: Safety/medical concerns         Wheelchair     Assist Will patient use wheelchair at discharge?: Yes Type of Wheelchair: Manual Wheelchair activity did not occur: Safety/medical concerns (cognitive concerns and lethargy)         Wheelchair 50 feet with 2 turns activity    Assist    Wheelchair 50 feet with 2 turns activity did not occur: Safety/medical concerns        Wheelchair 150 feet activity     Assist Wheelchair 150 feet activity did not occur: Safety/medical concerns          Medical Problem List and Plan: 1.Left side hemiparesis, now with spasticity and 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 Antithrombotics: -DVT/anticoagulation:Lovenox -antiplatelet therapy: Aspirin 325 mg daily and Plavix 75 mg daily x3 weeks then aspirin alone 3. Pain Management:Tylenol as needed. Well controlled 4. Mood:Provide emotional support  Amantadine with breakfast and lunch started on 8/25   Per daughter, somnolence is baseline with patient with inconsistent/erratic sleep/wake patterns, often falling asleep if not engaged in activities prior to admission. -antipsychotic agents: N/A 5. Neuropsych: This patientis ?capable of making decisions on herown behalf. 6. Skin/Wound Care:Routine skin checks. May d/c IV- order placed  Prevalon boots ordered 7. Fluids/Electrolytes/Nutrition:Routine in and outs 8. Hypertension:   Hydralazine - increased to 100mg  TID on 8/26, Norvasc 10 mg daily.   Elevated on 8/27, will not make further adjustments today given recent adjustments  Monitor with increased mobility 9. Hyperlipidemia. Continue Lipitor 10. AKI on CKD. Normal renal function 2017 but no recent visits, does not have a PCP.   Cr. 1.88 on 8/25 11. Leukocytosis- U/A (-)  WBCs 11.2 on 8/25  Afebrile 12. 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

## 2020-05-06 NOTE — Progress Notes (Signed)
Occupational Therapy Session Note  Patient Details  Name: [name] MRN: [id number] Date of Birth: Sep 06, 1961  Today's Date: 05/06/2020 OT Individual Time: 0867-6195 OT Individual Time Calculation (min): 75 min    Short Term Goals: Week 1:  OT Short Term Goal 1 (Week 1): Pt will maintain static sitting balance EOB with mod assist in preparation for transfer or selfcare tasks. OT Short Term Goal 2 (Week 1): Pt will complete UB dressing with mod assist in supported sitting following hemi dressing techniques. OT Short Term Goal 3 (Week 1): Pt will complete rollling in bed to the left with min assist and to the right with max assist in order to assist with selfcare and toileting tasks. OT Short Term Goal 4 (Week 1): Family will return demonstrate safe assist with AAROM/PROM exercises with the LUE following handout.  Skilled Therapeutic Interventions/Progress Updates:    Pt supine in bed preparing to get up with nurse tech.  Pt reporting intermittent muscle soreness in left and right neck. Pt also requesting to go outside for some fresh air during session.    Pt needed max assist to complete right rolling and sidelying to sit EOB.  Pt able to sit EOB with min assist without BUE.  Pt intermittently placing RUE on bed resulting in pushing to left past midline.  Pt needed VCs and TCs to facilitate BUE in lap to prevent pushing.  Total assist +2 needed for sliding board transfer EOB to w/c.  Pt positioned in mirror for visual feedback and OT asked pt what does she notice about her posture.  Pt reporting that she looks strange because she is leaning a lot.  Pt unable to correct independently despite good awareness using visual feedback.  Pt needed max assist to reposition trunk and head to midline.  Pt able to correct on her own for brief moments after a few times of OT repositioning, however returning to left lean quickly after correction.    Pt hadnt eaten breakfast reporting that the food "is  nasty".  OT collaborated with pt to identify foods that she likes to eat for breakfast, and able to encourage pt to try those items on tray.  Pt self fed approximately 10% of breakfast with supervision needing frequent cues to take smaller bites and sips of beverage.  Pt reports feeling food pocketed in left cheek.  OT transported pt to sinkside for oral care.  Pt needing min assist to brush teeth with intermittent VCs for compensatory techniques and to prevent task perseveration.    Pt transported to ground floor outside patio in w/c for time mgt.  OT provided soft tissue massage to pts bilateral upper traps, SCM, scalenes, and levator scapulae to facilitate improved soft tissue mobility for functional head positioning and movements. OT completed PROM neck lateral flexion and left/right rotation. Pt listened to gospel music during manual therapy to increase muscle relaxation and improve pts mood.  Pt transported to room, call bell in reach, seat belt alarm on.   Therapy Documentation Precautions:  Precautions Precautions: Fall, Other (comment) Precaution Comments: L hemi, pushing tendency with RUE to weak L side Restrictions Weight Bearing Restrictions: No   Therapy/Group: Individual Therapy  Ezekiel Slocumb 05/06/2020, 12:09 PM

## 2020-05-06 NOTE — Plan of Care (Signed)
  Problem: Consults Goal: RH STROKE PATIENT EDUCATION Description: See Patient Education module for education specifics  Outcome: Progressing Goal: Nutrition Consult-if indicated Outcome: Progressing Goal: Diabetes Guidelines if Diabetic/Glucose > 140 Description: If diabetic or lab glucose is > 140 mg/dl - Initiate Diabetes/Hyperglycemia Guidelines & Document Interventions  Outcome: Progressing   Problem: RH BOWEL ELIMINATION Goal: RH STG MANAGE BOWEL WITH ASSISTANCE Description: STG Manage Bowel with Mod I Assistance. Outcome: Progressing Goal: RH STG MANAGE BOWEL W/MEDICATION W/ASSISTANCE Description: STG Manage Bowel with Medication with Mod I Assistance. Outcome: Progressing   Problem: RH BLADDER ELIMINATION Goal: RH STG MANAGE BLADDER WITH ASSISTANCE Description: STG Manage Bladder With Mod I Assistance Outcome: Progressing   Problem: RH SKIN INTEGRITY Goal: RH STG SKIN FREE OF INFECTION/BREAKDOWN Description: Mod I Outcome: Progressing   Problem: RH SAFETY Goal: RH STG ADHERE TO SAFETY PRECAUTIONS W/ASSISTANCE/DEVICE Description: STG Adhere to Safety Precautions With Mod I Assistance/Device. Outcome: Progressing   Problem: RH PAIN MANAGEMENT Goal: RH STG PAIN MANAGED AT OR BELOW PT'S PAIN GOAL Description: <2 Outcome: Progressing   Problem: RH KNOWLEDGE DEFICIT Goal: RH STG INCREASE KNOWLEDGE OF HYPERTENSION Description: Patient will be able to manage HTN with medications and dietary restrictions using handouts and educational information independently Outcome: Progressing Goal: RH STG INCREASE KNOWLEDGE OF STROKE PROPHYLAXIS Description: Patient will acknowledge understanding of secondary stroke risks and management with stroke prophylaxis medications using handouts and educational materials independently Outcome: Progressing

## 2020-05-06 NOTE — Progress Notes (Signed)
Speech Language Pathology Daily Session Note  Patient Details  Name: [name] MRN: [id number] Date of Birth: 12-06-1960  Today's Date: 05/06/2020 SLP Individual Time: 1300-1345 SLP Individual Time Calculation (min): 45 min  Short Term Goals: Week 1: SLP Short Term Goal 1 (Week 1): Pt will follow 1 step commands in functional tasks with mod A multimodal cues. SLP Short Term Goal 2 (Week 1): Pt will self-monitor and self-correct functional and verbal errors with max A multimodal cues. SLP Short Term Goal 3 (Week 1): Pt will demonstrate alertness and sustained attention in 5 minute intervals with mod A verbal cues for redirection. SLP Short Term Goal 4 (Week 1): Pt will demonstrate 80% intelligibility at the phrase level with min A verbal cues to increase vocal intensity and slow rate. SLP Short Term Goal 5 (Week 1): Pt will consume dys 2 textures and thin liquids with min A verbal cues for swallow strategies and minimal overt s/s aspiration.  Skilled Therapeutic Interventions:   Patient seen for skilled ST treatment session focusing on cognitive-linguistic goals. Patient's daughter (DeeDee) in room with patient during session. Upon entering room, patient's daughter tells SLP that she has been spitting out blood and some small pieces of one of her back teeth and this has been going on for an hour or so. Patient seemed to be perseverating on rinsing mouth out and picking at her teeth with her finger. She required maximal verbal, tactile cues to look to left to see her daughter and then was able to accurately describe what her daughter was wearing. She was not able to make adequate eye contact with SLP even when in front or in right visual field. She repeated words she was saying as well as repeating things SLP or her daughter was saying and at one point she said, "Im going double-dutch with my words" meaning she was repeating herself, so she seems to have some awareness to this but is not able  to stop or manage it. Patient was aware of the year and month. Per patient and daughter, she has been not wanting to eat much because sweet things taste off to her. She did not eat anything on her lunch tray. Towards end of session, patient would suddenly close eyes and start to fall asleep without warning, which daughter said she has noticed. Patient continues to benefit from skilled ST tx to maximize her cognitive-linguistic, speech and swallowing function prior to discharge.   Pain Pain Assessment Pain Scale: 0-10 Pain Score: 0-No pain  Therapy/Group: Individual Therapy  Sonia Baller, MA, CCC-SLP 05/06/20 4:03 PM

## 2020-05-06 NOTE — Progress Notes (Signed)
Physical Therapy Session Note  Patient Details  Name: [name] MRN: [id number] Date of Birth: 1960-11-24  Today's Date: 05/06/2020 PT Individual Time: 1415-1500 PT Individual Time Calculation (min): 45 min   Short Term Goals: Week 1:  PT Short Term Goal 1 (Week 1): Pt will performed supine<>sit with modA PT Short Term Goal 2 (Week 1): Pt will maintain sitting EOB >54min with modA PT Short Term Goal 3 (Week 1): Pt will perform bed<>chair transfers with modA and LRAD PT Short Term Goal 4 (Week 1): Pt will tolerate sitting in w/c for > 2 hours to demonstrate improved activity tolerance  Skilled Therapeutic Interventions/Progress Updates:    Pt received sitting in w/c, daughter (dee-dee, from [place]) agreeable to PT session. Pt reports mouth pain, does not rate. Daughter reports that patient has been picking at mouth, getting food stuck in teeth, and has a loose tooth. Pt spitting blood into washcloth. RN notified of concerns. Pt with saturated t-shirt from oral secretions, required max/totalA for doffing and donning new shirt. Transported in w/c to day room with totalA for time management, trying to choose a closed and quiet environment to limit distractions. Once transported to day room, pt notably lethargic in her chair, difficult for her to maintain eyes open. Agreeable to sliding board transfer with 2 helpers maxA to mat table. Mirror placed in front of her for visual feedback to improve midline orientation and postural awareness. She required max/totalA to maintain sitting EOB and frequently falling asleep. Performed 1x5 R lateral elbow leans but pt unable to initiate upright sitting from lateral lean. Deferred further efforts at balance and functional mobility training due to significant fatigue and inability to actively participate in therapy. Pt performed squat pivot with maxA/totalA +2 back to her w/c, transported back to room, sliding boarding with max/totalA +2 to EOB. TotalA +2  sit>supine. Pt therapeutically positioned with several pillows supporting LHB and towel roll under cervical pillow to reduce L lateral cervical flexion. Bed rails up, bed alarm on, needs in reach. RN notified after session of pt's performance and mouth bleeding. Pt missed 30 minutes of skilled treatment.   Therapy Documentation Precautions:  Precautions Precautions: Fall, Other (comment) Precaution Comments: L hemi, pushing tendency with RUE to weak L side Restrictions Weight Bearing Restrictions: No  Therapy/Group: Individual Therapy  Fuller Makin P Naika Noto PT 05/06/2020, 3:13 PM

## 2020-05-07 ENCOUNTER — Inpatient Hospital Stay (HOSPITAL_COMMUNITY): Payer: 59 | Admitting: *Deleted

## 2020-05-07 ENCOUNTER — Inpatient Hospital Stay (HOSPITAL_COMMUNITY): Payer: 59 | Admitting: Occupational Therapy

## 2020-05-07 ENCOUNTER — Inpatient Hospital Stay (HOSPITAL_COMMUNITY): Payer: 59

## 2020-05-07 MED ORDER — ISOSORBIDE MONONITRATE ER 30 MG PO TB24
15.0000 mg | ORAL_TABLET | Freq: Every day | ORAL | Status: DC
  Administered 2020-05-07 – 2020-05-08 (×2): 15 mg via ORAL
  Filled 2020-05-07 (×2): qty 1

## 2020-05-07 NOTE — Plan of Care (Signed)
  Problem: Consults Goal: RH STROKE PATIENT EDUCATION Description: See Patient Education module for education specifics  Outcome: Progressing Goal: Nutrition Consult-if indicated Outcome: Progressing Goal: Diabetes Guidelines if Diabetic/Glucose > 140 Description: If diabetic or lab glucose is > 140 mg/dl - Initiate Diabetes/Hyperglycemia Guidelines & Document Interventions  Outcome: Progressing   Problem: RH BOWEL ELIMINATION Goal: RH STG MANAGE BOWEL WITH ASSISTANCE Description: STG Manage Bowel with Mod I Assistance. Outcome: Progressing Goal: RH STG MANAGE BOWEL W/MEDICATION W/ASSISTANCE Description: STG Manage Bowel with Medication with Mod I Assistance. Outcome: Progressing   Problem: RH BLADDER ELIMINATION Goal: RH STG MANAGE BLADDER WITH ASSISTANCE Description: STG Manage Bladder With Mod I Assistance Outcome: Progressing   Problem: RH SKIN INTEGRITY Goal: RH STG SKIN FREE OF INFECTION/BREAKDOWN Description: Mod I Outcome: Progressing   Problem: RH SAFETY Goal: RH STG ADHERE TO SAFETY PRECAUTIONS W/ASSISTANCE/DEVICE Description: STG Adhere to Safety Precautions With Mod I Assistance/Device. Outcome: Progressing   Problem: RH PAIN MANAGEMENT Goal: RH STG PAIN MANAGED AT OR BELOW PT'S PAIN GOAL Description: <2 Outcome: Progressing   Problem: RH KNOWLEDGE DEFICIT Goal: RH STG INCREASE KNOWLEDGE OF HYPERTENSION Description: Patient will be able to manage HTN with medications and dietary restrictions using handouts and educational information independently Outcome: Progressing Goal: RH STG INCREASE KNOWLEDGE OF STROKE PROPHYLAXIS Description: Patient will acknowledge understanding of secondary stroke risks and management with stroke prophylaxis medications using handouts and educational materials independently Outcome: Progressing

## 2020-05-07 NOTE — Progress Notes (Addendum)
Patient's last documented bowel movement on 05/03/20.  Sorbitol 70% 30 mL oral solution administered.  Patient tolerated well with water.  Nursing will continue to monitor closely for results.  0600:  Results still pending for Sorbitol effectiveness.  Oral temp of 99.4 with 650 mg Tylenol po administered. Handoff to oncoming nursing staff to follow.   Ajaya Crutchfield B. Rulon Eisenmenger, MSN, RN, CNL [facility]

## 2020-05-07 NOTE — Progress Notes (Signed)
Physical Therapy Note  Patient Details  Name: [name] MRN: [id number] Date of Birth: 03/09/1961 Today's Date: 05/07/2020    PT passing by pt's room & visitor present with mask completely off. Reminded visitor of need to wear mask at all times & she donned it. Nurse notified.  Waunita Schooner 05/07/2020, 11:07 AM

## 2020-05-07 NOTE — Progress Notes (Signed)
Occupational Therapy Session Note  Patient Details  Name: [name] MRN: [id number] Date of Birth: [birth year]  Today's Date: 05/07/2020 OT Individual Time: 1100-1200 OT Individual Time Calculation (min): 60 min    Short Term Goals: Week 1:  OT Short Term Goal 1 (Week 1): Pt will maintain static sitting balance EOB with mod assist in preparation for transfer or selfcare tasks. OT Short Term Goal 2 (Week 1): Pt will complete UB dressing with mod assist in supported sitting following hemi dressing techniques. OT Short Term Goal 3 (Week 1): Pt will complete rollling in bed to the left with min assist and to the right with max assist in order to assist with selfcare and toileting tasks. OT Short Term Goal 4 (Week 1): Family will return demonstrate safe assist with AAROM/PROM exercises with the LUE following handout.  Skilled Therapeutic Interventions/Progress Updates:    Pt received in bed with her daughter in the room. Daughter reported that she thought her mom had just had a bowel movement. Pt was soiled so with her daughters help, pt rolled in bed with max A of 2.  Pt talking and repeating herself constantly, with an extremely short attention span.  Pt has severe L neglect but can turn her head and look to the left with cuing.    Completed cleansing pt and then her NT arrived to be a +2 A with donning the brief, pants, sidelying to sit, and slide board to wc.  Pt has a strong pusher tendency, so she does best if she holds her L hand with her R hand to avoid pushing away with R hand.  She was able to hold static sit EOB for a few min with mod A.  OT facilitated forward lean and head/ hip orientation while nurse tech facilitated hip slide on board to w/c with total A.  Once in chair, pt positioned at sink for oral care with mod A to clean bottom teeth and upper dentures, bathe UB with mod A and don shirt with max A.    Pt's tilt and space wc adjusted back slightly and L side and arm  positioned with pillow.  Belt alarm on and gait belt for extra security.    Daughter in the room with pt to ensure pt stays in wc as she has a tendency to push herself to the side.   Therapy Documentation Precautions:  Precautions Precautions: Fall, Other (comment) Precaution Comments: L hemi, pushing tendency with RUE to weak L side Restrictions Weight Bearing Restrictions: No   Pain: Pain Assessment Pain Score: 0-No pain ADL: ADL Eating: Maximal assistance Where Assessed-Eating: Wheelchair Grooming: Moderate assistance Where Assessed-Grooming: Wheelchair Upper Body Bathing: Maximal assistance Where Assessed-Upper Body Bathing: Wheelchair Lower Body Bathing: Dependent Where Assessed-Lower Body Bathing: Bed level Upper Body Dressing: Dependent Where Assessed-Upper Body Dressing: Wheelchair Lower Body Dressing: Dependent Where Assessed-Lower Body Dressing: Bed level Toileting: Dependent Where Assessed-Toileting: Bed level Toilet Transfer: Dependent Toilet Transfer Method: Stand pivot Toilet Transfer Equipment: Bedside commode  Therapy/Group: Individual Therapy  [place] 05/07/2020, 12:36 PM

## 2020-05-07 NOTE — Progress Notes (Signed)
Speech Language Pathology Daily Session Note  Patient Details  Name: [name] MRN: [id number] Date of Birth: Nov 11, 1960  Today's Date: 05/07/2020 SLP Individual Time: 2122-4825 SLP Individual Time Calculation (min): 55 min  Short Term Goals: Week 1: SLP Short Term Goal 1 (Week 1): Pt will follow 1 step commands in functional tasks with mod A multimodal cues. SLP Short Term Goal 2 (Week 1): Pt will self-monitor and self-correct functional and verbal errors with max A multimodal cues. SLP Short Term Goal 3 (Week 1): Pt will demonstrate alertness and sustained attention in 5 minute intervals with mod A verbal cues for redirection. SLP Short Term Goal 4 (Week 1): Pt will demonstrate 80% intelligibility at the phrase level with min A verbal cues to increase vocal intensity and slow rate. SLP Short Term Goal 5 (Week 1): Pt will consume dys 2 textures and thin liquids with min A verbal cues for swallow strategies and minimal overt s/s aspiration.  Skilled Therapeutic Interventions:Skilled ST services focused on education, swallow and cognitive skills. Pt refused to consume items on breakfast tray, stating "it is all too sweet." Pt agreed to consume cereal with milk requiring min A verbal cues to slow rate and clear oral cavity between bites. No overt s/s aspiration noted. Pt demonstrated sustained attention in 5 minute intervals with mod A verbal cues for redirection. [name], pt's daughter entered the room and offered pt regular snack (honeydew) in which pt immediately began consuming at a fast rate. Pt required max A fade to mod A verbal/tatcile cues to slow rate and supervision A verbal cues to cease verbalization during mastication. Although pt was consuming items at a fast rate and not completely clearing solids prior to next bite, regular textures were eventually cleared and no overt s/s aspiration noted. SLP instructed [name] to cue pt to slow rate, and clear bolus between bites, in which she  returned demonstration. SLP signed off [name] to administered regular snacks with strict precautions to slow rate, clear oral cavity between bites, limit distractions and notify NT to preform oral care. SLP recommends to continue meals with dys 2 textures at this time, with regular textured snacks . SLP also recommends to monitor [name]'s continued use of swallow strategies.  Pt was left in room with daughter, call bell within reach and bed alarm set. SLP recommends to continue skilled services.     Pain Pain Assessment Pain Scale: 0-10 Pain Score: 4  Pain Location: Generalized  Therapy/Group: Individual Therapy  [name]  [facility] 05/07/2020, 7:30 AM

## 2020-05-07 NOTE — Progress Notes (Signed)
[place] PHYSICAL MEDICINE & REHABILITATION PROGRESS NOTE  Subjective/Complaints: Still having difficulty eating. Working better with SLP Daughter visiting  ROS: Denies CP, SOB, N/V/D  Objective: Vital Signs: Blood pressure (!) 178/86, pulse 90, temperature 99.4 F (37.4 C), temperature source Oral, resp. rate 16, height 5\' 3"  (1.6 m), weight 67.2 kg, last menstrual period 09/15/2013, SpO2 99 %. No results found. No results for input(s): WBC, HGB, HCT, PLT in the last 72 hours. No results for input(s): NA, K, CL, CO2, GLUCOSE, BUN, CREATININE, CALCIUM in the last 72 hours.  Physical Exam: BP (!) 178/86 (BP Location: Right Arm)   Pulse 90   Temp 99.4 F (37.4 C) (Oral)   Resp 16   Ht 5\' 3"  (1.6 m)   Wt 67.2 kg   LMP 09/15/2013   SpO2 99%   BMI 26.24 kg/m  General: Alert and oriented x 3, No apparent distress HEENT: Head is normocephalic, atraumatic, PERRLA, EOMI, sclera anicteric, oral mucosa pink and moist, dentition intact, ext ear canals clear,  Neck: Supple without JVD or lymphadenopathy Cardiovascular: No JVD.  RRR.  + Murmur. Respiratory: Normal effort.  No stridor.  Bilateral clear to auscultation. GI: Non-distended.  BS +. Skin: Warm and dry.  Intact. Psych: Perseverative, slowed Musc: No edema in extremities.  No tenderness in extremities. Neuro: Somnolent Left facial weakness Dysarthria, unchanged Motor: LUE/LE: 0/5 proximal distal Increase in tone noted left side   Assessment/Plan: 1. 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  Physiatrist is providing close team supervision and 24 hour management of active medical problems listed below.  Physiatrist and rehab team continue to assess barriers to discharge/monitor patient progress toward functional and medical goals  Care Tool:  Bathing    Body parts bathed by patient: Left arm, Chest,  Abdomen, Right upper leg, Left upper leg, Face   Body parts bathed by helper: Right arm, Right lower leg, Left lower leg, Buttocks, Front perineal area     Bathing assist Assist Level: Maximal Assistance - Patient 24 - 49%     Upper Body Dressing/Undressing Upper body dressing   What is the patient wearing?: Pull over shirt    Upper body assist Assist Level: Total Assistance - Patient < 25%    Lower Body Dressing/Undressing Lower body dressing      What is the patient wearing?: Incontinence brief     Lower body assist Assist for lower body dressing: 2 Helpers     Toileting Toileting    Toileting assist Assist for toileting: Total Assistance - Patient < 25%     Transfers Chair/bed transfer  Transfers assist     Chair/bed transfer assist level: 2 Helpers     Locomotion Ambulation   Ambulation assist   Ambulation activity did not occur: Safety/medical concerns          Walk 10 feet activity   Assist  Walk 10 feet activity did not occur: Safety/medical concerns        Walk 50 feet activity   Assist Walk 50 feet with 2 turns activity did not occur: Safety/medical concerns         Walk 150 feet activity   Assist Walk 150 feet activity did not occur: Safety/medical concerns         Walk 10 feet on uneven surface  activity   Assist Walk 10 feet on uneven surfaces activity did not occur: Safety/medical concerns  Wheelchair     Assist Will patient use wheelchair at discharge?: Yes Type of Wheelchair: Manual Wheelchair activity did not occur: Safety/medical concerns         Wheelchair 50 feet with 2 turns activity    Assist    Wheelchair 50 feet with 2 turns activity did not occur: Safety/medical concerns       Wheelchair 150 feet activity     Assist Wheelchair 150 feet activity did not occur: Safety/medical concerns          Medical Problem List and Plan: 1.Left side hemiparesis, now with  spasticity and 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 Antithrombotics: -DVT/anticoagulation:Lovenox -antiplatelet therapy: Aspirin 325 mg daily and Plavix 75 mg daily x3 weeks then aspirin alone 3. Pain Management:Tylenol as needed. Well controlled 4. Mood:Provide emotional support  Amantadine with breakfast and lunch started on 8/25   Per daughter, somnolence is baseline with patient with inconsistent/erratic sleep/wake patterns, often falling asleep if not engaged in activities prior to admission. -antipsychotic agents: N/A 5. Neuropsych: This patientis ?capable of making decisions on herown behalf. 6. Skin/Wound Care:Routine skin checks. May d/c IV- order placed  Prevalon boots ordered 7. Fluids/Electrolytes/Nutrition:Routine in and outs. Discussed with SLP- still having difficulty eating foods. Daughter has been bringing healthy foods at ho,e.  8. Hypertension:   Hydralazine - increased to 100mg  TID on 8/26, Norvasc 10 mg daily.   Elevated 8/28: start Imdur 15mg  daily.   Monitor with increased mobility 9. Hyperlipidemia. Continue Lipitor 10. AKI on CKD. Normal renal function 2017 but no recent visits, does not have a PCP.   Cr. 1.88 on 8/25 11. Leukocytosis- U/A (-)  WBCs 11.2 on 8/25  Afebrile 12. 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

## 2020-05-07 NOTE — Progress Notes (Signed)
Physical Therapy Session Note  Patient Details  Name: [name] MRN: [id number] Date of Birth: 22-Aug-1961  Today's Date: 05/07/2020 PT Individual Time: 1305-1400 PT Individual Time Calculation (min): 55 min   Short Term Goals: Week 1:  PT Short Term Goal 1 (Week 1): Pt will performed supine<>sit with modA PT Short Term Goal 2 (Week 1): Pt will maintain sitting EOB >25mn with modA PT Short Term Goal 3 (Week 1): Pt will perform bed<>chair transfers with modA and LRAD PT Short Term Goal 4 (Week 1): Pt will tolerate sitting in w/c for > 2 hours to demonstrate improved activity tolerance  Skilled Therapeutic Interventions/Progress Updates: Pt presented in TIS with dgt present agreeable to therapy. Pt transported to ortho gym for quiet environment. Pt hyperverbal and appeared to be repeating a lot of what PTA or tech was saying. Pt performed SB transfer to high/low mat maxA x 2. Once sitting at mat PTA used mirror for visual feedback to work on midline orientation. PTA had pt hold L hand with R to minimize pushing with RUE. Pt was able to briefly attain midline however was unable to maintain with max multimodal cues. After approx 5 min pt becoming more lethargic and ultimately falling asleep with pt able to be roused with noxious stimuli however unable to stay aroused. Pt transferred back to TIS via SB but dependent x 2. Upon pt returning to [place] becoming more alert and stating she wants to stay awake but doesn't know how. Pt transferred back to room and performed SB transfer to bed maxA x 2. Pt noted to be incontinent of bladder (seat wet) therefore transferred to supine maxA x 1. Pt performed rolling L/R with maxA and max verbal cues to reach for bedrail as well as to maintain hold on bed rail. Once pt's brief changed pt noted to be more awake/alert therefore attempted sitting balance activities again at EOB. Pt required maxA x 1 supine to sit with max multimodal cues for sequencing. Once  sitting EOB pt was able to follow commands and attempt static sit for approx 5 min then again becoming more lethargic and once again falling asleep. Pt returned to bed total A x 2 with pt's slightly rousing. Pt repositioned to comfort and left with bed alarm on, call bell within reach and needs met.      Therapy Documentation Precautions:  Precautions Precautions: Fall, Other (comment) Precaution Comments: L hemi, pushing tendency with RUE to weak L side Restrictions Weight Bearing Restrictions: No General: PT Amount of Missed Time (min): 20 Minutes PT Missed Treatment Reason: Patient fatigue Vital Signs: Therapy Vitals Temp: 98.2 F (36.8 C) Temp Source: Oral Pulse Rate: 83 Resp: 16 BP: (!) 145/76 Patient Position (if appropriate): Lying Oxygen Therapy SpO2: 100 % O2 Device: Room Air Pain: Pain Assessment Pain Score: 0-No pain   Therapy/Group: Individual Therapy  Addalynn Kumari  Bora Broner, PTA  05/07/2020, 3:42 PM

## 2020-05-08 ENCOUNTER — Inpatient Hospital Stay (HOSPITAL_COMMUNITY): Payer: 59 | Admitting: Occupational Therapy

## 2020-05-08 MED ORDER — MELATONIN 3 MG PO TABS
3.0000 mg | ORAL_TABLET | Freq: Every day | ORAL | Status: DC
  Administered 2020-05-08 – 2020-05-31 (×24): 3 mg via ORAL
  Filled 2020-05-08 (×24): qty 1

## 2020-05-08 MED ORDER — ISOSORBIDE MONONITRATE ER 30 MG PO TB24
30.0000 mg | ORAL_TABLET | Freq: Every day | ORAL | Status: DC
  Administered 2020-05-09 – 2020-06-01 (×24): 30 mg via ORAL
  Filled 2020-05-08 (×25): qty 1

## 2020-05-08 NOTE — Progress Notes (Signed)
Occupational Therapy Session Note  Patient Details  Name: [name] MRN: [id number] Date of Birth: 06-24-1961  Today's Date: 05/08/2020 OT Individual Time: 9326-7124 OT Individual Time Calculation (min): 59 min    Short Term Goals: Week 1:  OT Short Term Goal 1 (Week 1): Pt will maintain static sitting balance EOB with mod assist in preparation for transfer or selfcare tasks. OT Short Term Goal 2 (Week 1): Pt will complete UB dressing with mod assist in supported sitting following hemi dressing techniques. OT Short Term Goal 3 (Week 1): Pt will complete rollling in bed to the left with min assist and to the right with max assist in order to assist with selfcare and toileting tasks. OT Short Term Goal 4 (Week 1): Family will return demonstrate safe assist with AAROM/PROM exercises with the LUE following handout.   Skilled Therapeutic Interventions/Progress Updates:    Pt greeted at time of session supine in bed resting with daughter present, daughter expressed concerns that her mom had not been out of bed today d/t "lack of safety plan." Explained to daughter sheet on door that recommends maximove transfer at this time d/t current level of function. Pt transitioned supine to sit EOB with Max-total A with pt able to assist maneuvering RLE to EOB with cues but Max for LLE and total A to bring trunk into upright sitting. EOB sitting to improve core strength and frequent cues to bring self to midline, inconsistent between Mod-Min for sitting balance at times. Returned to supine total A and Maximove bed <> chair for dependent transfer, but pt able to assist rolling L in bed, with Mod A and Max/total rolling to R side. Once in Fisher chair, brought to sink level and performed oral hygiene Min A, encouraged use of L hand as well as brushing hair in same manner. NMR with mirror for feedback for keeping head/shoulders/trunk level and in midline requiring frequent cues to keep eyes open/forward and  attend. Continues to have L lateral lean but able to briefly bring self to midline, only to quickly return to L lean. Maximove back to bed, positioned in partial side lying for pressure relief, LUE elevated and pillows placed to prevent flexed posture to decrease risk of contractures. Alarm on, call bell in reach.   Therapy Documentation Precautions:  Precautions Precautions: Fall, Other (comment) Precaution Comments: L hemi, pushing tendency with RUE to weak L side Restrictions Weight Bearing Restrictions: No     Therapy/Group: Individual Therapy  [name] 05/08/2020, 3:36 PM

## 2020-05-08 NOTE — Plan of Care (Signed)
  Problem: Consults Goal: RH STROKE PATIENT EDUCATION Description: See Patient Education module for education specifics  Outcome: Progressing Goal: Nutrition Consult-if indicated Outcome: Progressing Goal: Diabetes Guidelines if Diabetic/Glucose > 140 Description: If diabetic or lab glucose is > 140 mg/dl - Initiate Diabetes/Hyperglycemia Guidelines & Document Interventions  Outcome: Progressing   Problem: RH BOWEL ELIMINATION Goal: RH STG MANAGE BOWEL WITH ASSISTANCE Description: STG Manage Bowel with Mod I Assistance. Outcome: Progressing Goal: RH STG MANAGE BOWEL W/MEDICATION W/ASSISTANCE Description: STG Manage Bowel with Medication with Mod I Assistance. Outcome: Progressing   Problem: RH BLADDER ELIMINATION Goal: RH STG MANAGE BLADDER WITH ASSISTANCE Description: STG Manage Bladder With Mod I Assistance Outcome: Progressing   Problem: RH SKIN INTEGRITY Goal: RH STG SKIN FREE OF INFECTION/BREAKDOWN Description: Mod I Outcome: Progressing   Problem: RH SAFETY Goal: RH STG ADHERE TO SAFETY PRECAUTIONS W/ASSISTANCE/DEVICE Description: STG Adhere to Safety Precautions With Mod I Assistance/Device. Outcome: Progressing   Problem: RH PAIN MANAGEMENT Goal: RH STG PAIN MANAGED AT OR BELOW PT'S PAIN GOAL Description: <2 Outcome: Progressing   Problem: RH KNOWLEDGE DEFICIT Goal: RH STG INCREASE KNOWLEDGE OF HYPERTENSION Description: Patient will be able to manage HTN with medications and dietary restrictions using handouts and educational information independently Outcome: Progressing Goal: RH STG INCREASE KNOWLEDGE OF STROKE PROPHYLAXIS Description: Patient will acknowledge understanding of secondary stroke risks and management with stroke prophylaxis medications using handouts and educational materials independently Outcome: Progressing

## 2020-05-08 NOTE — Progress Notes (Signed)
[place] PHYSICAL MEDICINE & REHABILITATION PROGRESS NOTE  Subjective/Complaints: Repeating words Sed she kept tossing and turning last night. Agreeable to melatonin.   ROS: Denies CP, SOB, N/V/D  Objective: Vital Signs: Blood pressure (!) 154/67, pulse 90, temperature 98.8 F (37.1 C), resp. rate 18, height 5\' 3"  (1.6 m), weight 67.2 kg, last menstrual period 09/15/2013, SpO2 100 %. No results found. No results for input(s): WBC, HGB, HCT, PLT in the last 72 hours. No results for input(s): NA, K, CL, CO2, GLUCOSE, BUN, CREATININE, CALCIUM in the last 72 hours.  Physical Exam: BP (!) 154/67 (BP Location: Right Arm)   Pulse 90   Temp 98.8 F (37.1 C)   Resp 18   Ht 5\' 3"  (1.6 m)   Wt 67.2 kg   LMP 09/15/2013   SpO2 100%   BMI 26.24 kg/m  General: Alert and oriented x 3, No apparent distress HEENT: Head is normocephalic, atraumatic, PERRLA, EOMI, sclera anicteric, oral mucosa pink and moist, dentition intact, ext ear canals clear,  Neck: Supple without JVD or lymphadenopathy Heart: Reg rate and rhythm. No murmurs rubs or gallops Cardiovascular: No JVD.  RRR.  + Murmur. Respiratory: Normal effort.  No stridor.  Bilateral clear to auscultation. GI: Non-distended.  BS +. Skin: Warm and dry.  Intact. Psych: Perseverative, slowed Musc: No edema in extremities.  No tenderness in extremities. Neuro: Somnolent Left facial weakness Dysarthria, unchanged Motor: LUE/LE: 0/5 proximal distal Increase in tone noted left side  Assessment/Plan: 1. 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  Physiatrist is providing close team supervision and 24 hour management of active medical problems listed below.  Physiatrist and rehab team continue to assess barriers to discharge/monitor patient progress toward functional and medical goals  Care Tool:  Bathing    Body parts  bathed by patient: Left arm, Chest, Abdomen, Right upper leg, Left upper leg, Face   Body parts bathed by helper: Right arm, Right lower leg, Left lower leg, Buttocks, Front perineal area     Bathing assist Assist Level: Maximal Assistance - Patient 24 - 49%     Upper Body Dressing/Undressing Upper body dressing   What is the patient wearing?: Pull over shirt    Upper body assist Assist Level: Total Assistance - Patient < 25%    Lower Body Dressing/Undressing Lower body dressing      What is the patient wearing?: Incontinence brief, Pants     Lower body assist Assist for lower body dressing: 2 Helpers     Toileting Toileting    Toileting assist Assist for toileting: Total Assistance - Patient < 25%     Transfers Chair/bed transfer  Transfers assist     Chair/bed transfer assist level: 2 Helpers (total A with +2)     Locomotion Ambulation   Ambulation assist   Ambulation activity did not occur: Safety/medical concerns          Walk 10 feet activity   Assist  Walk 10 feet activity did not occur: Safety/medical concerns        Walk 50 feet activity   Assist Walk 50 feet with 2 turns activity did not occur: Safety/medical concerns         Walk 150 feet activity   Assist Walk 150 feet activity did not occur: Safety/medical concerns         Walk 10 feet on uneven surface  activity  Assist Walk 10 feet on uneven surfaces activity did not occur: Safety/medical concerns         Wheelchair     Assist Will patient use wheelchair at discharge?: Yes Type of Wheelchair: Manual Wheelchair activity did not occur: Safety/medical concerns         Wheelchair 50 feet with 2 turns activity    Assist    Wheelchair 50 feet with 2 turns activity did not occur: Safety/medical concerns       Wheelchair 150 feet activity     Assist Wheelchair 150 feet activity did not occur: Safety/medical concerns          Medical  Problem List and Plan: 1.Left side hemiparesis, now with spasticity and 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 Antithrombotics: -DVT/anticoagulation:Lovenox -antiplatelet therapy: Aspirin 325 mg daily and Plavix 75 mg daily x3 weeks then aspirin alone 3. Pain Management:Tylenol as needed. Well controlled 4. Mood:Provide emotional support  Amantadine with breakfast and lunch started on 8/25  Per daughter, somnolence is baseline with patient with inconsistent/erratic sleep/wake patterns, often falling asleep if not engaged in activities prior to admission. Melatonin started 8/29 -antipsychotic agents: N/A 5. Neuropsych: This patientis ?capable of making decisions on herown behalf. 6. Skin/Wound Care:Routine skin checks. May d/c IV- order placed  Prevalon boots ordered 7. Fluids/Electrolytes/Nutrition:Routine in and outs. Discussed with SLP- still having difficulty eating foods. Daughter has been bringing healthy foods at ho,e.  8. Hypertension:   Hydralazine - increased to 100mg  TID on 8/26, Norvasc 10 mg daily.   Elevated 8/28: start Imdur 15mg  daily.   Elevated 8/29: Increase Imdur to 30mg   Monitor with increased mobility 9. Hyperlipidemia. Continue Lipitor 10. AKI on CKD. Normal renal function 2017 but no recent visits, does not have a PCP.   Cr. 1.88 on 8/25 11. Leukocytosis- U/A (-)  WBCs 11.2 on 8/25  Afebrile 12. 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

## 2020-05-09 ENCOUNTER — Inpatient Hospital Stay (HOSPITAL_COMMUNITY): Payer: 59 | Admitting: Occupational Therapy

## 2020-05-09 ENCOUNTER — Inpatient Hospital Stay (HOSPITAL_COMMUNITY): Payer: 59 | Admitting: Speech Pathology

## 2020-05-09 ENCOUNTER — Inpatient Hospital Stay (HOSPITAL_COMMUNITY): Payer: 59

## 2020-05-09 LAB — CBC WITH DIFFERENTIAL/PLATELET
Abs Immature Granulocytes: 0.03 10*3/uL (ref 0.00–0.07)
Basophils Absolute: 0 10*3/uL (ref 0.0–0.1)
Basophils Relative: 0 %
Eosinophils Absolute: 0.2 10*3/uL (ref 0.0–0.5)
Eosinophils Relative: 2 %
HCT: 32.1 % — ABNORMAL LOW (ref 36.0–46.0)
Hemoglobin: 10.4 g/dL — ABNORMAL LOW (ref 12.0–15.0)
Immature Granulocytes: 0 %
Lymphocytes Relative: 11 %
Lymphs Abs: 1.1 10*3/uL (ref 0.7–4.0)
MCH: 29 pg (ref 26.0–34.0)
MCHC: 32.4 g/dL (ref 30.0–36.0)
MCV: 89.4 fL (ref 80.0–100.0)
Monocytes Absolute: 1 10*3/uL (ref 0.1–1.0)
Monocytes Relative: 10 %
Neutro Abs: 8 10*3/uL — ABNORMAL HIGH (ref 1.7–7.7)
Neutrophils Relative %: 77 %
Platelets: 172 10*3/uL (ref 150–400)
RBC: 3.59 MIL/uL — ABNORMAL LOW (ref 3.87–5.11)
RDW: 13.6 % (ref 11.5–15.5)
WBC: 10.3 10*3/uL (ref 4.0–10.5)
nRBC: 0 % (ref 0.0–0.2)

## 2020-05-09 MED ORDER — TRAMADOL HCL 50 MG PO TABS
50.0000 mg | ORAL_TABLET | Freq: Four times a day (QID) | ORAL | Status: DC | PRN
  Administered 2020-05-10 – 2020-05-23 (×10): 50 mg via ORAL
  Filled 2020-05-09 (×11): qty 1

## 2020-05-09 NOTE — Progress Notes (Signed)
Occupational Therapy Session Note  Patient Details  Name: [name] MRN: [id number] Date of Birth: [birth year]  Today's Date: 05/09/2020 OT Individual Time: 3254-9826 OT Individual Time Calculation (min): 45 min    Short Term Goals: Week 1:  OT Short Term Goal 1 (Week 1): Pt will maintain static sitting balance EOB with mod assist in preparation for transfer or selfcare tasks. OT Short Term Goal 2 (Week 1): Pt will complete UB dressing with mod assist in supported sitting following hemi dressing techniques. OT Short Term Goal 3 (Week 1): Pt will complete rollling in bed to the left with min assist and to the right with max assist in order to assist with selfcare and toileting tasks. OT Short Term Goal 4 (Week 1): Family will return demonstrate safe assist with AAROM/PROM exercises with the LUE following handout.  Skilled Therapeutic Interventions/Progress Updates:    Pt seen this session to facilitate attention, motor planning, balance, postural control, and L side scanning. Pt received in bed with her daughter in the room. Her daughter is a CNA as was very comfortable jumping in to help during the session.  Pt continues to be very hyperverbal and frequently repeating herself with minimal attention.  She was able to scan to the left with fewer cues.  From bed level, donned her leggings for her with pt assisting with bending her knees with mod A and rolling. She does not have pain at rest but frequently c/o arthritic knee pain with mobility.    Pt rolled to her R side with mod A and then sat up with guidance, tactile cues with mod A.  Her daughter provided a +2 A as pt transferred via slide board to w/c with mod A.  At sink, pt worked on UB bathing with min and dressing with max A.  Max cues for midline awareness as pt holds her head tilted to the left. Her daughter helped her with oral care as I retrieved a full lap tray to help with positioning.  Upon returning to the room, pt  still working with dtr on oral care but therapy session time was over.   Her dtr said she felt comfortable placing the lap tray on the chair as she has done that before at work.   Pt in room with dtr with her supervising pt.    Therapy Documentation Precautions:  Precautions Precautions: Fall, Other (comment) Precaution Comments: L hemi, pushing tendency with RUE to weak L side Restrictions Weight Bearing Restrictions: No  Pain: Pain Assessment Pain Score: 0-No pain    Therapy/Group: Individual Therapy  [place] 05/09/2020, 11:26 AM

## 2020-05-09 NOTE — Progress Notes (Signed)
Physical Therapy Session Note  Patient Details  Name: [name] MRN: [id number] Date of Birth: 1961/05/15  Today's Date: 05/09/2020 PT Individual Time: 3794-3276 PT Individual Time Calculation (min): 55 min   Short Term Goals: Week 1:  PT Short Term Goal 1 (Week 1): Pt will performed supine<>sit with modA PT Short Term Goal 2 (Week 1): Pt will maintain sitting EOB >30min with modA PT Short Term Goal 3 (Week 1): Pt will perform bed<>chair transfers with modA and LRAD PT Short Term Goal 4 (Week 1): Pt will tolerate sitting in w/c for > 2 hours to demonstrate improved activity tolerance  Skilled Therapeutic Interventions/Progress Updates:    Pt received sitting in w/c, agreeable to PT session. Pt reports unrated L cervical neck pain, unable to describe intensity or type of pain. Therapist wheeled pt in front of her bedroom mirror to assist with visual feedback while performing L upper trap/scalene/levator stretch, significant tone and muscle tightness surrounding peri-spinals and upper trapezius. Limited carryover noted after completion however pt subjectively reports reduced pain. Pt transported to therapy gym with [place] in w/c for time management. Once arrived in therapy gym, pt sleeping and continues to have difficulty maintaining eyes open and actively participating in therapy session. Performed squat-pivot with maxA +2 from w/c to mat table, therapist cues for sequencing and technique. Once short sitting at mat table, significant posterior lean present with pt unable to correct despite visual, verbal, and tactile cueing. Placed foam wedge posteriorly to assist therapist with upright neutral sitting. Pt continued to fall asleep quickly while seated at mat table, performed 1x5 lateral elbow leans on R elbow with modA for facilitation and maxA for return to upright. Slight carryover b/w trials but pt continued to show limited initiation and poor motor planning. Pt verbalized that she urinated  and was unable to hold for adequate amount of time for therapist to return to room. Thus, performed squat-pivot with maxA +2 from mat table back to her TIS w/c, transported back to her room with totalA, squat-pivot with totalA +1 from TIS to EOB. MaxA for sit>supine. TotalA for removing pants/brief. Handed patient a wet washcloth for pericare that she was able to do with her RUE. MaxA rolling L<>R throughout for donning new brief and backside pericare. Pt able to perform supine scoot with modA with bed in trendelenburg, RUE to head of bed to assist. Pt therapeutically positioned with LUE supported via pillow, cervical neck in midline, heels floated. Bed rails up, bed alarm on, needs in reach, pt sleeping.  Therapy Documentation Precautions:  Precautions Precautions: Fall, Other (comment) Precaution Comments: L hemi, pushing tendency with RUE to weak L side Restrictions Weight Bearing Restrictions: No Vital Signs: Therapy Vitals Temp: (!) 97.5 F (36.4 C) Pulse Rate: (!) 106 Resp: 16 BP: 138/78 Patient Position (if appropriate): Sitting Oxygen Therapy SpO2: 98 % O2 Device: Room Air Pain: Pain Assessment Pain Score: 0-No pain  Therapy/Group: Individual Therapy  Deshayla Empson P Yomara Toothman PT 05/09/2020, 2:59 PM

## 2020-05-09 NOTE — Progress Notes (Signed)
Speech Language Pathology Daily Session Note  Patient Details  Name: [name] MRN: [id number] Date of Birth: 09-09-1961  Today's Date: 05/09/2020 SLP Individual Time: 1300-1355 SLP Individual Time Calculation (min): 55 min  Short Term Goals: Week 1: SLP Short Term Goal 1 (Week 1): Pt will follow 1 step commands in functional tasks with mod A multimodal cues. SLP Short Term Goal 2 (Week 1): Pt will self-monitor and self-correct functional and verbal errors with max A multimodal cues. SLP Short Term Goal 3 (Week 1): Pt will demonstrate alertness and sustained attention in 5 minute intervals with mod A verbal cues for redirection. SLP Short Term Goal 4 (Week 1): Pt will demonstrate 80% intelligibility at the phrase level with min A verbal cues to increase vocal intensity and slow rate. SLP Short Term Goal 5 (Week 1): Pt will consume dys 2 textures and thin liquids with min A verbal cues for swallow strategies and minimal overt s/s aspiration.  Skilled Therapeutic Interventions: Skilled treatment session focused on cognitive goals. Upon arrival, patient had not eaten lunch. She declined PO intake despite offering X 3 throughout session reporting she was not hungry despite education and encouragement. Therefore, SLP facilitated session by providing overall extra time and Max A verbal and visual cues for attention and Mod A verbal and visual cues for problem solving and visual scanning to the left during a basic calendar making task. Patient with decreased intelligibility and would repeat verbal responses/statements multiple times despite attempts at redirection. Patient left upright in the wheelchair with alarm on and all needs within reach. Continue with current plan of care.      Pain Pain Assessment Pain Score: 0-No pain  Therapy/Group: Individual Therapy  Irie Dowson 05/09/2020, 3:08 PM

## 2020-05-09 NOTE — Progress Notes (Signed)
[facility] PHYSICAL MEDICINE & REHABILITATION PROGRESS NOTE  Subjective/Complaints:   Pt repeated every phrase 3x- perseverating.  Also c/o terrible pain in L leg/L groin- said "has to do something about it".   Didn't sleep well due to pain.     ROS:  Pt denies SOB, abd pain, CP, N/V/C/D, and vision changes   Objective: Vital Signs: Blood pressure (!) 186/95, pulse 95, temperature 98.1 F (36.7 C), temperature source Oral, resp. rate 20, height 5\' 3"  (1.6 m), weight 67.2 kg, last menstrual period 09/15/2013, SpO2 97 %. No results found. Recent Labs    05/09/20 0508  WBC 10.3  HGB 10.4*  HCT 32.1*  PLT 172   No results for input(s): NA, K, CL, CO2, GLUCOSE, BUN, CREATININE, CALCIUM in the last 72 hours.  Physical Exam: BP (!) 186/95 (BP Location: Right Arm)    Pulse 95    Temp 98.1 F (36.7 C) (Oral)    Resp 20    Ht 5\' 3"  (1.6 m)    Wt 67.2 kg    LMP 09/15/2013    SpO2 97%    BMI 26.24 kg/m  General: Awake, rolling constantly in bed- perseverating x3 on every phrase said, No acute distress HEENT: appears to have conjugate gaze Cardiovascular: RRR- (+) murmur Respiratory: CTA B/L- no W/R/R- good air movement GI: Soft, NT, ND, (+)BS  Skin: Warm and dry.  Intact. Psych: Perseverative, slowed- no change Musc: No edema in extremities.  No tenderness in extremities. Neuro: much more awake Left facial weakness Dysarthria, unchanged Motor: LUE/LE: 0/5 proximal distal Increase in tone noted left side MAS of 3-4 noticed in L hip and a little bit less in L knee- very hard to move through ROM and painful for pt.   Assessment/Plan: 1. 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  Physiatrist is providing close team supervision and 24 hour management of active medical problems listed below.  Physiatrist and rehab team continue to assess barriers to  discharge/monitor patient progress toward functional and medical goals  Care Tool:  Bathing    Body parts bathed by patient: Left arm, Chest, Abdomen, Right upper leg, Left upper leg, Face   Body parts bathed by helper: Right arm, Right lower leg, Left lower leg, Buttocks, Front perineal area     Bathing assist Assist Level: Maximal Assistance - Patient 24 - 49%     Upper Body Dressing/Undressing Upper body dressing   What is the patient wearing?: Pull over shirt    Upper body assist Assist Level: Total Assistance - Patient < 25%    Lower Body Dressing/Undressing Lower body dressing      What is the patient wearing?: Incontinence brief, Pants     Lower body assist Assist for lower body dressing: 2 Helpers     Toileting Toileting    Toileting assist Assist for toileting: Total Assistance - Patient < 25%     Transfers Chair/bed transfer  Transfers assist     Chair/bed transfer assist level: Dependent - mechanical lift     Locomotion Ambulation   Ambulation assist   Ambulation activity did not occur: Safety/medical concerns          Walk 10 feet activity   Assist  Walk 10 feet activity did not occur: Safety/medical concerns        Walk 50 feet activity   Assist Walk 50 feet with 2 turns activity did  not occur: Safety/medical concerns         Walk 150 feet activity   Assist Walk 150 feet activity did not occur: Safety/medical concerns         Walk 10 feet on uneven surface  activity   Assist Walk 10 feet on uneven surfaces activity did not occur: Safety/medical concerns         Wheelchair     Assist Will patient use wheelchair at discharge?: Yes Type of Wheelchair: Manual Wheelchair activity did not occur: Safety/medical concerns         Wheelchair 50 feet with 2 turns activity    Assist    Wheelchair 50 feet with 2 turns activity did not occur: Safety/medical concerns       Wheelchair 150 feet activity      Assist Wheelchair 150 feet activity did not occur: Safety/medical concerns          Medical Problem List and Plan: 1.Left side hemiparesis, now with spasticity and 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 Antithrombotics: -DVT/anticoagulation:Lovenox -antiplatelet therapy: Aspirin 325 mg daily and Plavix 75 mg daily x3 weeks then aspirin alone 3. Pain Management:Tylenol as needed. Well controlled  8/30- suggest tramadol for pain  4. Mood:Provide emotional support  Amantadine with breakfast and lunch started on 8/25  Per daughter, somnolence is baseline with patient with inconsistent/erratic sleep/wake patterns, often falling asleep if not engaged in activities prior to admission. Melatonin started 8/29 -antipsychotic agents: N/A 5. Neuropsych: This patientis ?capable of making decisions on herown behalf. 6. Skin/Wound Care:Routine skin checks. May d/c IV- order placed  Prevalon boots ordered 7. Fluids/Electrolytes/Nutrition:Routine in and outs. Discussed with SLP- still having difficulty eating foods. Daughter has been bringing healthy foods at ho,e.  8. Hypertension:   Hydralazine - increased to 100mg  TID on 8/26, Norvasc 10 mg daily.   Elevated 8/28: start Imdur 15mg  daily.   Elevated 8/29: Increase Imdur to 30mg   8/30- BP 186/95- will give 1 day for BP meds to show a change  Monitor with increased mobility 9. Hyperlipidemia. Continue Lipitor 10. AKI on CKD. Normal renal function 2017 but no recent visits, does not have a PCP.   Cr. 1.88 on 8/25  8/30- recheck labs in AM 11. Leukocytosis- U/A (-)  WBCs 11.2 on 8/25  8/30- WBC 10.3  Afebrile 12. 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  Spasticity- getting worse in LLE-   8/30- not on spasticity meds due to risk of sedation- will suggest Dantrolene since rarely sedating. Takes awhile to kick in, but would start 50 mg QHS x 7 days, then 50 mg BID x 7 days, then TID- don't titrate faster- increases risk of LFT elevation. If not PO meds, suggest Botox.     LOS: 6 days A FACE TO FACE EVALUATION WAS PERFORMED  Nakiea Metzner 05/09/2020, 1:55 PM

## 2020-05-09 NOTE — Progress Notes (Signed)
Occupational Therapy Session Note  Patient Details  Name: [name] MRN: [id number] Date of Birth: 1961/03/26  Today's Date: 05/09/2020 OT Individual Time: 1046-1130 OT Individual Time Calculation (min): 44 min    Short Term Goals: Week 1:  OT Short Term Goal 1 (Week 1): Pt will maintain static sitting balance EOB with mod assist in preparation for transfer or selfcare tasks. OT Short Term Goal 2 (Week 1): Pt will complete UB dressing with mod assist in supported sitting following hemi dressing techniques. OT Short Term Goal 3 (Week 1): Pt will complete rollling in bed to the left with min assist and to the right with max assist in order to assist with selfcare and toileting tasks. OT Short Term Goal 4 (Week 1): Family will return demonstrate safe assist with AAROM/PROM exercises with the LUE following handout.  Skilled Therapeutic Interventions/Progress Updates:    Pt sitting up in w/c with dtr at side, reporting neck soreness, agreeable to OT session.  Pt positioned in front of mirror and cues provided to facilitate pt self initiation of righting to midline.  Pt able to identify left leaning and head tilt and able to verbalize appropriate correction needed.  Pt able to initiate correcting to midline however unable to fully achieve needing min assist and tactile cues to achieve posture.  Pt quickly distracted and returning to left leaning.  Instructed pt in AAROM with OT providing gentle manual assistance to complete left upper trap stretch to facilitate improved soft tissue extensibility and functional neck movement.  10 reps with 10 second holds completed.  Pt then transported to gym and completed visual scanning and attending task sitting at [employer].  Pt frequently falling asleep during task needing max VCs to participate.  Pt then completed scan and reach task with facilitation of right sided trunk lean to grasp and place 10 orange cones.  Pt needing max VCs to visually attend  to object prior to reaching for it.  Pt returned to room, call bell in reach, seat belt alarm on.  Therapy Documentation Precautions:  Precautions Precautions: Fall, Other (comment) Precaution Comments: L hemi, pushing tendency with RUE to weak L side Restrictions Weight Bearing Restrictions: No   Therapy/Group: Individual Therapy  Ezekiel Slocumb 05/09/2020, 5:13 PM

## 2020-05-10 ENCOUNTER — Inpatient Hospital Stay (HOSPITAL_COMMUNITY): Payer: 59

## 2020-05-10 ENCOUNTER — Inpatient Hospital Stay (HOSPITAL_COMMUNITY): Payer: 59 | Admitting: Physical Therapy

## 2020-05-10 ENCOUNTER — Inpatient Hospital Stay (HOSPITAL_COMMUNITY): Payer: 59 | Admitting: Occupational Therapy

## 2020-05-10 LAB — BASIC METABOLIC PANEL
Anion gap: 11 (ref 5–15)
BUN: 17 mg/dL (ref 6–20)
CO2: 23 mmol/L (ref 22–32)
Calcium: 9.4 mg/dL (ref 8.9–10.3)
Chloride: 101 mmol/L (ref 98–111)
Creatinine, Ser: 1.83 mg/dL — ABNORMAL HIGH (ref 0.44–1.00)
GFR calc Af Amer: 35 mL/min — ABNORMAL LOW (ref >60)
GFR calc non Af Amer: 30 mL/min — ABNORMAL LOW (ref >60)
Glucose, Bld: 106 mg/dL — ABNORMAL HIGH (ref 70–99)
Potassium: 3.3 mmol/L — ABNORMAL LOW (ref 3.5–5.1)
Sodium: 135 mmol/L (ref 135–145)

## 2020-05-10 LAB — GLUCOSE, CAPILLARY
Glucose-Capillary: 118 mg/dL — ABNORMAL HIGH (ref 70–99)
Glucose-Capillary: 98 mg/dL (ref 70–99)
Glucose-Capillary: 107 mg/dL — ABNORMAL HIGH (ref 70–99)

## 2020-05-10 MED ORDER — IBUPROFEN 200 MG PO TABS
200.0000 mg | ORAL_TABLET | Freq: Three times a day (TID) | ORAL | Status: DC | PRN
  Administered 2020-05-10 – 2020-05-11 (×3): 200 mg via ORAL
  Filled 2020-05-10 (×4): qty 1

## 2020-05-10 MED ORDER — DICLOFENAC SODIUM 1 % EX GEL
2.0000 g | Freq: Four times a day (QID) | CUTANEOUS | Status: DC
  Administered 2020-05-10 – 2020-06-01 (×79): 2 g via TOPICAL
  Filled 2020-05-10 (×2): qty 100

## 2020-05-10 MED ORDER — DANTROLENE SODIUM 25 MG PO CAPS
25.0000 mg | ORAL_CAPSULE | Freq: Every day | ORAL | Status: DC
  Administered 2020-05-10: 25 mg via ORAL
  Filled 2020-05-10: qty 1

## 2020-05-10 NOTE — Progress Notes (Signed)
Occupational Therapy Session Note  Patient Details  Name: [name] MRN: [id number] Date of Birth: September 12, 1960  Today's Date: 05/10/2020 OT Individual Time: 1345-1500 OT Individual Time Calculation (min): 75 min    Short Term Goals: Week 1:  OT Short Term Goal 1 (Week 1): Pt will maintain static sitting balance EOB with mod assist in preparation for transfer or selfcare tasks. OT Short Term Goal 2 (Week 1): Pt will complete UB dressing with mod assist in supported sitting following hemi dressing techniques. OT Short Term Goal 3 (Week 1): Pt will complete rollling in bed to the left with min assist and to the right with max assist in order to assist with selfcare and toileting tasks. OT Short Term Goal 4 (Week 1): Family will return demonstrate safe assist with AAROM/PROM exercises with the LUE following handout.  Skilled Therapeutic Interventions/Progress Updates:    Pt supine in bed, complaints of neck soreness intermittently during session.  Pt completed supine to sit EOB with max assist.  Pt needing max assist to right to midline and place hand in lap to prevent pushing, however pt intermittently returning RUE in pushing position.  Pt completed EOB self care with total assist +2 for most of task to maintain upright sitting posture.  Pt did self right for brief moment needing only CGA, however returning to significant right and posterior leaning despite max VCs and TCs.  Hand over hand provided to wash RUE using left hand.  Re-educated pt on hemitechnique in order to doff/donn shirt; max assist and max VCs needed for sequencing.   Pt completed sliding board transfer to w/c using sheet under buttocks to protect skin integrity with pt needing total assist +2.  Pt heavily pushing and leaning posteriorly and to left during transfer despite VCs and TCs to prevent.  Pt brushed teeth at sinkside with min assist and mod VCs to discontinue task once completed secondary to pt perseverating.  Pt  completed sliding board transfer to EOB needing same amount of assistance as prior transfer.  Total assist +2 to complete sit to supine.  Pt noted with soiled brief of urine.  Total assist +2 needed for rolling and toileting.  Pt positioned on left side with max assist in order to position neck in neutral position and improve comfort.  Call bell in reach, bed alarm on.  Therapy Documentation Precautions:  Precautions Precautions: Fall, Other (comment) Precaution Comments: L hemi, pushing tendency with RUE to weak L side Restrictions Weight Bearing Restrictions: No   Therapy/Group: Individual Therapy  Ezekiel Slocumb 05/10/2020, 12:49 PM

## 2020-05-10 NOTE — Progress Notes (Signed)
Speech Language Pathology Daily Session Note  Patient Details  Name: [name] MRN: [id number] Date of Birth: 07/08/1961  Today's Date: 05/10/2020 SLP Individual Time: 0955-1050 SLP Individual Time Calculation (min): 55 min  Short Term Goals: Week 1: SLP Short Term Goal 1 (Week 1): Pt will follow 1 step commands in functional tasks with mod A multimodal cues. SLP Short Term Goal 2 (Week 1): Pt will self-monitor and self-correct functional and verbal errors with max A multimodal cues. SLP Short Term Goal 3 (Week 1): Pt will demonstrate alertness and sustained attention in 5 minute intervals with mod A verbal cues for redirection. SLP Short Term Goal 4 (Week 1): Pt will demonstrate 80% intelligibility at the phrase level with min A verbal cues to increase vocal intensity and slow rate. SLP Short Term Goal 5 (Week 1): Pt will consume dys 2 textures and thin liquids with min A verbal cues for swallow strategies and minimal overt s/s aspiration.  Skilled Therapeutic Interventions: Pt was seen for skilled ST targeting dysphagia and cognitive goals. Her daughter, [name], was present for session. SLP facilitated session with Min A verbal cues for slow rate and use of liquid washes for oral clearance of trace PO residue during trial of advanced Dysphagia 3 (mechanical soft) solid texture snack. Pt's mastication was efficient and timely. Recommend pt upgrade to Dysphagia 3 textures, continue thin liquids, pills whole in pudding 1 at a time.  SLP further facilitated session with tasks targeting attention, problem solving, and awareness. Pt required Mod A verbal cues for redirection to tasks throughout session. She is also easily distracted by extraneous items in the environment/on the tray, therefore some items removed to reduce potential for distraction. SLP provided Min A verbal and visual cues for problem solving, initiation, and error awareness during a basic 3-step action card sequencing task.  She described and demonstrated purpose/how to use call bell to request assistance with Supervision A verbal cues at end of session. Pt left sitting in chair with alarm set and needs within reach. Continue per current plan of care.        Pain Pain Assessment Pain Scale: 0-10 Pain Score: 0-No pain  Therapy/Group: Individual Therapy  Arbutus Leas 05/10/2020, 12:00 PM

## 2020-05-10 NOTE — Progress Notes (Signed)
Physical Therapy Session Note  Patient Details  Name: [name] MRN: [id number] Date of Birth: 04-15-1961  Today's Date: 05/10/2020 PT Individual Time: 0800-0908 PT Individual Time Calculation (min): 68 min   Short Term Goals: Week 1:  PT Short Term Goal 1 (Week 1): Pt will performed supine<>sit with modA PT Short Term Goal 2 (Week 1): Pt will maintain sitting EOB >56min with modA PT Short Term Goal 3 (Week 1): Pt will perform bed<>chair transfers with modA and LRAD PT Short Term Goal 4 (Week 1): Pt will tolerate sitting in w/c for > 2 hours to demonstrate improved activity tolerance  Skilled Therapeutic Interventions/Progress Updates:    Pt received supine awake in bed, agreeable to PT session. Pt reports unrated L hip flexor pain near her groin, appears to be primarily associated with shortening in flexed position and may be contributed to flexor tone. Pt reports she did not get any sleep last night, she "saw every hour on the clock." Pt reports poor sleep 2/2 inability to get comfortable as she states she often lays on her sides but is unable to roll without assistance. Pt is able to identify call bell and press when asked. She also reports she would take nightly benadryl prior to hospitalization to assist with sleep; will communicate with team to attempt mitigating sleep disturbances as this is greatly impacting her tolerance with therapies.   Donned B socks with totalA while pt supine in bed. Donned scrub bottoms with maxA, pt performing bridging technique to assist with bringing over bottom. Pt performed rolling R with maxA +1, cues for initiation and using bed rail. Supine<>sit with maxA +1 via log rolling technique with HOB flat. Required maxA +1 for maintaining sitting EOB due to posterior bias and L lateral lean. Squat<>pivot from EOB to w/c with maxA +2 and transported to therapy gym with total A for time management. Squat-pivot with maxA +2 from EOB to mat table. Performed  sitting balance with mirror for visual feedback, focusing on midline orientation and promoting neutral cervical positioning. Placed target for reaching RUE laterally to reduce L lateral lean which had good carryover. Placed both hands in propping position while +2 assist for opening up and stretching chest. Performed x3 sit<>stands with maxA +2 from mat table, L knee block. 1st standing trial focusing on upright and erect posture, 2nd trial we used basketball goal with x3 horseshoes around rim that was placed in an upper R plane to target R reaching for weight shifting onto RLE and reducing L lateral lean. Pt then performed sitting balance tasks with targeted reaching with RUE across midline to promote midline postural awareness. Squat-pivot with maxA +2 with updated TIS w/c, transported back to room and remained seated in chair with daughter at bedside, seat belt alarm on, needs in reach. New TIS w/c will need altered with headrest lowered and new LLE leg rest, can be addressed next session.    Therapy Documentation Precautions:  Precautions Precautions: Fall, Other (comment) Precaution Comments: L hemi, pushing tendency with RUE to weak L side Restrictions Weight Bearing Restrictions: No   Therapy/Group: Individual Therapy  Tymier Lindholm P [place] Deleo PT 05/10/2020, 9:18 AM

## 2020-05-10 NOTE — Progress Notes (Addendum)
[facility] PHYSICAL MEDICINE & REHABILITATION PROGRESS NOTE  Subjective/Complaints: Received note from [facility] OT to assess patient's tooth as she has been picking at it and family is concerned that this may be contributing to her not eating.  Gregary Signs also notes that patient has bilateral knee OA.   ROS:  Pt denies SOB, abd pain, CP, N/V/C/D, and vision changes   Objective: Vital Signs: Blood pressure (!) 164/80, pulse 97, temperature 98.1 F (36.7 C), resp. rate 18, height 5\' 3"  (1.6 m), weight 67.2 kg, last menstrual period 09/15/2013, SpO2 99 %. No results found. Recent Labs    05/09/20 0508  WBC 10.3  HGB 10.4*  HCT 32.1*  PLT 172   Recent Labs    05/10/20 0629  NA 135  K 3.3*  CL 101  CO2 23  GLUCOSE 106*  BUN 17  CREATININE 1.83*  CALCIUM 9.4    Physical Exam: BP (!) 164/80 (BP Location: Right Arm)   Pulse 97   Temp 98.1 F (36.7 C)   Resp 18   Ht 5\' 3"  (1.6 m)   Wt 67.2 kg   LMP 09/15/2013   SpO2 99%   BMI 26.24 kg/m  General: Alert, No apparent distress HEENT: Head is normocephalic, atraumatic, PERRLA, EOMI, sclera anicteric, oral mucosa pink and moist, dentition intact, ext ear canals clear,  Neck: Supple without JVD or lymphadenopathy Heart: Reg rate and rhythm. No murmurs rubs or gallops Chest: CTA bilaterally without wheezes, rales, or rhonchi; no distress Abdomen: Soft, non-tender, non-distended, bowel sounds positive. Psych: Perseverative, slowed- no change Musc: No edema in extremities.  No tenderness in extremities. Neuro: much more awake Left facial weakness Dysarthria, unchanged Motor: LUE/LE: 0/5 proximal distal Increase in tone noted left side MAS of 3-4 noticed in L hip and a little bit less in L knee- very hard to move through ROM and painful for pt.  Skin: stage 2 sacral injury    Assessment/Plan: 1. 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  Physiatrist is providing close team supervision and 24 hour management of active medical problems listed below.  Physiatrist and rehab team continue to assess barriers to discharge/monitor patient progress toward functional and medical goals  Care Tool:  Bathing    Body parts bathed by patient: Left arm, Chest, Abdomen, Right upper leg, Left upper leg, Face   Body parts bathed by helper: Right arm, Right lower leg, Left lower leg, Buttocks, Front perineal area     Bathing assist Assist Level: Maximal Assistance - Patient 24 - 49%     Upper Body Dressing/Undressing Upper body dressing   What is the patient wearing?: Pull over shirt    Upper body assist Assist Level: Total Assistance - Patient < 25%    Lower Body Dressing/Undressing Lower body dressing      What is the patient wearing?: Incontinence brief, Pants     Lower body assist Assist for lower body dressing: 2 Helpers     Toileting Toileting    Toileting assist Assist for toileting: Total Assistance - Patient < 25%     Transfers Chair/bed transfer  Transfers assist     Chair/bed transfer assist level: Dependent - mechanical lift     Locomotion Ambulation   Ambulation assist   Ambulation activity did not occur: Safety/medical concerns          Walk 10 feet activity   Assist  Walk  10 feet activity did not occur: Safety/medical concerns        Walk 50 feet activity   Assist Walk 50 feet with 2 turns activity did not occur: Safety/medical concerns         Walk 150 feet activity   Assist Walk 150 feet activity did not occur: Safety/medical concerns         Walk 10 feet on uneven surface  activity   Assist Walk 10 feet on uneven surfaces activity did not occur: Safety/medical concerns         Wheelchair     Assist Will patient use wheelchair at discharge?: Yes Type of Wheelchair: Manual Wheelchair activity did not occur: Safety/medical  concerns         Wheelchair 50 feet with 2 turns activity    Assist    Wheelchair 50 feet with 2 turns activity did not occur: Safety/medical concerns       Wheelchair 150 feet activity     Assist Wheelchair 150 feet activity did not occur: Safety/medical concerns          Medical Problem List and Plan: 1.Left side hemiparesis, now with spasticity and 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 Antithrombotics: -DVT/anticoagulation:Lovenox -antiplatelet therapy: Aspirin 325 mg daily and Plavix 75 mg daily x3 weeks then aspirin alone 3. Pain Management:Tylenol as needed.   8/31: added voltaren gel for bilateral knee OA. 4. Mood:Provide emotional support  Amantadine with breakfast and lunch started on 8/25  Per daughter, somnolence is baseline with patient with inconsistent/erratic sleep/wake patterns, often falling asleep if not engaged in activities prior to admission. Melatonin started 8/29 -antipsychotic agents: N/A 5. Neuropsych: This patientis ?capable of making decisions on herown behalf. 6. Skin/Wound Care:Routine skin checks. May d/c IV- order placed  Prevalon boots ordered 7. Fluids/Electrolytes/Nutrition:Routine in and outs. Discussed with SLP- still having difficulty eating foods. Daughter has been bringing healthy foods at ho,e.  8. Hypertension:   Hydralazine - increased to 100mg  TID on 8/26, Norvasc 10 mg daily.   Elevated 8/28: start Imdur 15mg  daily.   Elevated 8/29: Increase Imdur to 30mg   8/30- BP 186/95- will give 1 day for BP meds to show a change  Monitor with increased mobility 9. Hyperlipidemia. Continue Lipitor 10. AKI on CKD. Normal renal function 2017 but no recent visits, does not have a PCP.   Cr. 1.88 on 8/25  8/30- recheck labs in AM 11. Leukocytosis- U/A (-)  WBCs 11.2 on  8/25  8/30- WBC 10.3  Afebrile 12. 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 Spasticity- getting worse in LLE-   8/31: started Dantrolene HS.  49. Patient's family concerned about tooth discomfort: difficult for me to assess on exam. Discussed that we will set her up for outpatient dentist follow-up. It is not painful for her but food gets stuck. Discussed soft D3 diet.     LOS: 7 days A FACE TO FACE EVALUATION WAS PERFORMED  Karinne Schmader P Avree Szczygiel 05/10/2020, 10:05 AM

## 2020-05-11 ENCOUNTER — Inpatient Hospital Stay (HOSPITAL_COMMUNITY): Payer: 59 | Admitting: Occupational Therapy

## 2020-05-11 ENCOUNTER — Inpatient Hospital Stay (HOSPITAL_COMMUNITY): Payer: 59 | Admitting: Speech Pathology

## 2020-05-11 ENCOUNTER — Inpatient Hospital Stay (HOSPITAL_COMMUNITY): Payer: 59

## 2020-05-11 LAB — GLUCOSE, CAPILLARY: Glucose-Capillary: 99 mg/dL (ref 70–99)

## 2020-05-11 MED ORDER — CLONAZEPAM 0.25 MG PO TBDP
0.2500 mg | ORAL_TABLET | Freq: Every day | ORAL | Status: DC | PRN
  Administered 2020-05-18 – 2020-05-26 (×5): 0.25 mg via ORAL
  Filled 2020-05-11 (×6): qty 1

## 2020-05-11 MED ORDER — DANTROLENE SODIUM 25 MG PO CAPS
50.0000 mg | ORAL_CAPSULE | Freq: Every day | ORAL | Status: DC
  Administered 2020-05-11 – 2020-05-15 (×5): 50 mg via ORAL
  Filled 2020-05-11 (×5): qty 2

## 2020-05-11 MED ORDER — CLONIDINE HCL 0.1 MG PO TABS
0.1000 mg | ORAL_TABLET | Freq: Two times a day (BID) | ORAL | Status: DC
  Administered 2020-05-11 – 2020-05-26 (×31): 0.1 mg via ORAL
  Filled 2020-05-11 (×31): qty 1

## 2020-05-11 NOTE — Progress Notes (Signed)
Speech Language Pathology Weekly Progress and Session Note  Patient Details  Name: [name] MRN: [id number] Date of Birth: [birth year]  Beginning of progress report period: May 03, 2020 End of progress report period: May 11, 2020  Today's Date: 05/11/2020 SLP Individual Time: 7672-0947 SLP Individual Time Calculation (min): 55 min  Short Term Goals: Week 1: SLP Short Term Goal 1 (Week 1): Pt will follow 1 step commands in functional tasks with mod A multimodal cues. SLP Short Term Goal 1 - Progress (Week 1): Met SLP Short Term Goal 2 (Week 1): Pt will self-monitor and self-correct functional and verbal errors with max A multimodal cues. SLP Short Term Goal 2 - Progress (Week 1): Met SLP Short Term Goal 3 (Week 1): Pt will demonstrate alertness and sustained attention in 5 minute intervals with mod A verbal cues for redirection. SLP Short Term Goal 3 - Progress (Week 1): Met SLP Short Term Goal 4 (Week 1): Pt will demonstrate 80% intelligibility at the phrase level with min A verbal cues to increase vocal intensity and slow rate. SLP Short Term Goal 4 - Progress (Week 1): Met SLP Short Term Goal 5 (Week 1): Pt will consume dys 2 textures and thin liquids with min A verbal cues for swallow strategies and minimal overt s/s aspiration. SLP Short Term Goal 5 - Progress (Week 1): Met    New Short Term Goals: Week 2: SLP Short Term Goal 1 (Week 2): Patient will consume current diet of Dys. 3 textures with thin liquids with minimal overt s/s of aspiration and Min A vebral cues for use of swallowing compensatory strategies. SLP Short Term Goal 2 (Week 2): Patient will demonstrate sustained attention to functional tasks for ~10 minutes with Mod A verbal cues for redirection. SLP Short Term Goal 3 (Week 2): Patient will scan to left visual field during functional tasks in 75% of opportunities with Mod A multimodal cues. SLP Short Term Goal 4 (Week 2): Patient will demonstrate  functional problem solving for basic and familiar tasks with Mod A verbal cues. SLP Short Term Goal 5 (Week 2): Patient will utilize speech intelligibility strateies at the sentence level to achieve ~90% intelligibility with Min A verbal cues.  Weekly Progress Updates: Patient has made functional gains and has met 5 of 5 STGs this reporting period. Currently, patient was upgraded to Dys. 3 textures with thin liquids and requires overall Mod A verbal cues for use of swallowing compensatory strategies. Patient also requires Mod A verbal cues for use of speech intelligibility strategies at the phrase level but continues to repeat phrases several times. Patient also requires overall Max A multimodal cues for emergent awareness of errors and Mod A verbal cues for attention for short 5 minute intervals and to follow 1-step directions. Patient and family education ongoing. Patient would benefit from continued skilled SLP intervention to maximize her cognitive, swallowing and speech function prior to discharge.     Intensity: Minumum of 1-2 x/day, 30 to 90 minutes Frequency: 3 to 5 out of 7 days Duration/Length of Stay: 2 weeks Treatment/Interventions: Cognitive remediation/compensation;Cueing hierarchy;Dysphagia/aspiration precaution training;Functional tasks;Internal/external aids;Speech/Language facilitation;Patient/family education   Daily Session  Skilled Therapeutic Interventions:  Skilled treatment session focused on dysphagia and cognitive goals. SLP facilitated session by providing set-up assist with breakfast meal of oatmeal with thin liquids. Patient required supervision level verbal cues for selective attention to self-feeding in a minimally distracting enviornment and consumed meal without overt s/s of aspiration. Recommend patient continue current diet. Patient  performed oral care via the suction toothbrush after PO intake with Mod verbal cues for cessation of task. SLP also facilitated session  by providing extra time and overall Mod A verbal cues for basic problem solving with a money management task. Patient left upright in bed with alarm on and all needs within reach. Continue with current plan of care.      Pain No/Denies Pain   Therapy/Group: Individual Therapy  Sula Fetterly 05/11/2020, 6:49 AM

## 2020-05-11 NOTE — Progress Notes (Signed)
Occupational Therapy Weekly Progress Note  Patient Details  Name: [name] MRN: [id number] Date of Birth: [birth year]  Beginning of progress report period: May 04, 2020 End of progress report period: May 11, 2020  Today's Date: 05/11/2020 OT Individual Time: 1000-1100 OT Individual Time Calculation (min): 60 min    Patient has met 1 of 4 short term goals.  Pt is demonstrating progress for the other 3 goals, but has not been able to accomplish fully or consistently.  Family needs continued practice with ROM exercises as pt is developing hypertone and facilitation of stretching needs to be modified.  Will continue to work with her family on this.   Patient continues to demonstrate the following deficits: muscle weakness and muscle joint tightness, decreased cardiorespiratoy endurance, abnormal tone, unbalanced muscle activation, decreased coordination and decreased motor planning, decreased midline orientation and decreased attention to left, decreased attention, decreased awareness, decreased problem solving, decreased safety awareness and decreased memory and decreased sitting balance, decreased postural control, hemiplegia and decreased balance strategies and therefore will continue to benefit from skilled OT intervention to enhance overall performance with BADL and Reduce care partner burden.  Patient progressing toward long term goals..  Continue plan of care.  OT Short Term Goals Week 1:  OT Short Term Goal 1 (Week 1): Pt will maintain static sitting balance EOB with mod assist in preparation for transfer or selfcare tasks. OT Short Term Goal 1 - Progress (Week 1): Progressing toward goal OT Short Term Goal 2 (Week 1): Pt will complete UB dressing with mod assist in supported sitting following hemi dressing techniques. OT Short Term Goal 2 - Progress (Week 1): Progressing toward goal OT Short Term Goal 3 (Week 1): Pt will complete rollling in bed to the left with min  assist and to the right with max assist in order to assist with selfcare and toileting tasks. OT Short Term Goal 3 - Progress (Week 1): Met OT Short Term Goal 4 (Week 1): Family will return demonstrate safe assist with AAROM/PROM exercises with the LUE following handout. OT Short Term Goal 4 - Progress (Week 1): Progressing toward goal Week 2:  OT Short Term Goal 1 (Week 2): Pt will be able to maintain static sit at EOB with mod A to prep for slide transfer. OT Short Term Goal 2 (Week 2): Pt will be able to self correct posture with mod cues and min A when she leans too far to the L. OT Short Term Goal 3 (Week 2): Pt will don shirt with mod A. OT Short Term Goal 4 (Week 2): pt will complete slide board transfer with max A of 1 to increase potential for toilet transfers.  Skilled Therapeutic Interventions/Progress Updates:      Pt seen for BADL retraining of toileting, bathing, and dressing with a focus on postural control, attention, L side awareness. Pt received in bed with her dtr in the room.  Pt had a wet brief and needed to be changed. Her daughter was very helpful in assisting this therapist with +2 tasks as needed.    Bed level - focused on controlled rolling, bridging, flexing hips and knees to allow pt to be more actively involved in self bathing (able to reach all areas except L side of bottom and L lower leg), and dressing (pt able to pull pants over R leg from ankle to thigh and 50% of the way over her R hip)  To help with focus and attention, as pt tends to  constantly repeat herself ("thank you, thank you") had pt stated what she is doing ("I am washing my arm").  This did help with focus.  +2 SB to wc and pt worked on [employer] self care with mod -max A needing constant readjustment and cues as she would lean to her L.    To help with postioning, placed towel under L side of cushion to wedge it up and full lap tray.  Positioned pt's arm open in slight external rotation.  She was able to sit  upright.   Discussed arm positioning with her daughter and range to wrist and hypertone could lead to contractures. Pt will benefit for a resting hand splint. Will notify MD.  Pt in chair with dtr in the room with her.   Therapy Documentation Precautions:  Precautions Precautions: Fall, Other (comment) Precaution Comments: L hemi, pushing tendency with RUE to weak L side Restrictions Weight Bearing Restrictions: No  Pain: c/o L knee pain - applied Voltaren get; c/o L groin pain - placed disposable hot pack on groin - pt stated that felt helpful   ADL: ADL Eating: Moderate assistance Where Assessed-Eating: Wheelchair Grooming: Moderate assistance Where Assessed-Grooming: Wheelchair Upper Body Bathing: Moderate assistance Where Assessed-Upper Body Bathing: Wheelchair Lower Body Bathing: Moderate assistance Where Assessed-Lower Body Bathing: Bed level Upper Body Dressing: Maximal assistance Where Assessed-Upper Body Dressing: Wheelchair Lower Body Dressing: Maximal assistance Where Assessed-Lower Body Dressing: Bed level Toileting: Dependent Where Assessed-Toileting: Bed level Toilet Transfer: Dependent Toilet Transfer Method: Not assessed Toilet Transfer Equipment: Bedside commode   Therapy/Group: Individual Therapy  [name] 05/11/2020, 12:50 PM

## 2020-05-11 NOTE — Progress Notes (Addendum)
[facility] PHYSICAL MEDICINE & REHABILITATION PROGRESS NOTE  Subjective/Complaints: Left leg still with spasticity. Slept better last night with the Dantrolene. Tolerated the medication well- will increase dose today. Pain is better controlled with Ibuprofen Daughter reports that she has anxiety at times.   ROS:  Pt denies SOB, abd pain, CP, N/V/C/D, and vision changes   Objective: Vital Signs: Blood pressure (!) 163/77, pulse 85, temperature 98.4 F (36.9 C), resp. rate 15, height 5\' 3"  (1.6 m), weight 67.2 kg, last menstrual period 09/15/2013, SpO2 98 %. No results found. Recent Labs    05/09/20 0508  WBC 10.3  HGB 10.4*  HCT 32.1*  PLT 172   Recent Labs    05/10/20 0629  NA 135  K 3.3*  CL 101  CO2 23  GLUCOSE 106*  BUN 17  CREATININE 1.83*  CALCIUM 9.4    Physical Exam: BP (!) 163/77 (BP Location: Right Arm)   Pulse 85   Temp 98.4 F (36.9 C)   Resp 15   Ht 5\' 3"  (1.6 m)   Wt 67.2 kg   LMP 09/15/2013   SpO2 98%   BMI 26.24 kg/m  General: Alert and oriented x 3, No apparent distress HEENT: Head is normocephalic, atraumatic, PERRLA, EOMI, sclera anicteric, oral mucosa pink and moist, dentition intact, ext ear canals clear,  Neck: Supple without JVD or lymphadenopathy Heart: Reg rate and rhythm. No murmurs rubs or gallops Chest: CTA bilaterally without wheezes, rales, or rhonchi; no distress Abdomen: Soft, non-tender, non-distended, bowel sounds positive. Extremities: No clubbing, cyanosis, or edema. Pulses are 2+ Skin: stage 2 sacral injury Psych: Perseverative, slowed- no change Musc: No edema in extremities.  No tenderness in extremities. Neuro: much more awake Left facial weakness Dysarthria, unchanged Motor: LUE/LE: 0/5 proximal distal Increase in tone noted left side MAS of 3-4 noticed in L hip and a little bit less in L knee- very hard to move through ROM and painful for pt.      Assessment/Plan: 1. 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  Physiatrist is providing close team supervision and 24 hour management of active medical problems listed below.  Physiatrist and rehab team continue to assess barriers to discharge/monitor patient progress toward functional and medical goals  Care Tool:  Bathing    Body parts bathed by patient: Left arm, Chest, Abdomen, Right upper leg, Left upper leg, Face, Right lower leg   Body parts bathed by helper: Right arm, Left lower leg, Buttocks, Front perineal area     Bathing assist Assist Level: 2 Helpers (due to significant pushing and right posterior lean sitting EOB and bed level)     Upper Body Dressing/Undressing Upper body dressing   What is the patient wearing?: Pull over shirt    Upper body assist Assist Level: 2 Helpers (sitting EOB)    Lower Body Dressing/Undressing Lower body dressing      What is the patient wearing?: Incontinence brief, Pants     Lower body assist Assist for lower body dressing: 2 Helpers     Toileting Toileting    Toileting assist Assist for toileting: Total Assistance - Patient < 25%     Transfers Chair/bed transfer  Transfers assist     Chair/bed transfer assist level: Dependent - mechanical lift     Locomotion Ambulation   Ambulation assist   Ambulation activity did not occur: Safety/medical concerns  Walk 10 feet activity   Assist  Walk 10 feet activity did not occur: Safety/medical concerns        Walk 50 feet activity   Assist Walk 50 feet with 2 turns activity did not occur: Safety/medical concerns         Walk 150 feet activity   Assist Walk 150 feet activity did not occur: Safety/medical concerns         Walk 10 feet on uneven surface  activity   Assist Walk 10 feet on uneven surfaces activity did not occur: Safety/medical concerns          Wheelchair     Assist Will patient use wheelchair at discharge?: Yes Type of Wheelchair: Manual Wheelchair activity did not occur: Safety/medical concerns         Wheelchair 50 feet with 2 turns activity    Assist    Wheelchair 50 feet with 2 turns activity did not occur: Safety/medical concerns       Wheelchair 150 feet activity     Assist Wheelchair 150 feet activity did not occur: Safety/medical concerns          Medical Problem List and Plan: 1.Left side hemiparesis, now with spasticity and dysarthriasecondary to right ACA infarction  Continue CIR   Tolerating SLP well this morning.  2. Antithrombotics: -DVT/anticoagulation:Lovenox -antiplatelet therapy: Aspirin 325 mg daily and Plavix 75 mg daily x3 weeks then aspirin alone 3. Pain Management:Tylenol as needed.   8/31: added voltaren gel for bilateral knee OA.  9/1: Ibuprofen added for pain and this has helped.  4. Mood:Provide emotional support  Amantadine with breakfast and lunch started on 8/25  Per daughter, somnolence is baseline with patient with inconsistent/erratic sleep/wake patterns, often falling asleep if not engaged in activities prior to admission. Melatonin started 8/29  9/1: Klonopin 0.25mg  daily prn added for anxiety.  -antipsychotic agents: N/A 5. Neuropsych: This patientis ?capable of making decisions on herown behalf. 6. Skin/Wound Care:Routine skin checks. May d/c IV- order placed  Prevalon boots ordered 7. Fluids/Electrolytes/Nutrition:Routine in and outs. Discussed with SLP- still having difficulty eating foods. Daughter has been bringing healthy foods at [facility].  8. Hypertension:   Hydralazine - increased to 100mg  TID on 8/26, Norvasc 10 mg daily.   Elevated 8/28: start Imdur 15mg  daily.   Elevated 8/29: Increase Imdur to 30mg   8/30- BP 186/95- will give 1 day for BP meds to show a change  9/1: BP elevated. Clonidine 0.1mg  daily added-  will also help with tone.   Monitor with increased mobility 9. Hyperlipidemia. Continue Lipitor 10. AKI on CKD. Normal renal function 2017 but no recent visits, does not have a PCP.   Cr. 1.88 on 8/25  8/30- recheck labs in AM 11. Leukocytosis- U/A (-)  WBCs 11.2 on 8/25  8/30- WBC 10.3  Afebrile 12. 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 Spasticity- getting worse in LLE-   8/31: started Dantrolene HS.  76. Patient's family concerned about tooth discomfort: difficult for me to assess on exam. Discussed that we will set her up for outpatient dentist follow-up. It is not painful for her but food gets stuck. Discussed soft D3 diet.     LOS: 8 days A FACE TO FACE EVALUATION WAS PERFORMED  [name] [name] 05/11/2020, 8:46 AM

## 2020-05-11 NOTE — Progress Notes (Signed)
Physical Therapy Weekly Progress Note  Patient Details  Name: [name] MRN: [id number] Date of Birth: 09/24/1960   Beginning of progress report period: May 04, 2020 End of progress report period: May 11, 2020  Today's Date: 05/11/2020 PT Individual Time: 1415-1500 PT Individual Time Calculation (min): 45 min   Patient has met 1 of 4 short term goals.  Pt making slow progress towards goals. Her first few days of rehabilitation have been slow going due to her significant fatigue with therapies as she would often and frequently fall asleep, unable to actively participate. However, as of recent, she has been improving in alertness and ability to maintain adequate wakefulness to engage in therapy sessions. She continues to be primarily limited by significantly impaired sitting balance, decreased initiation, impaired sustained attention, general fatigue, dense LHB weakness (0/5), and impaired perception of midline orientation. She requires max/otalA +1-2 for all bed mobility, transfers, and unsupported sitting balance.  Patient continues to demonstrate the following deficits muscle weakness, impaired timing and sequencing, abnormal tone, unbalanced muscle activation, motor apraxia, decreased coordination and decreased motor planning, decreased midline orientation, decreased attention to left, decreased motor planning and ideational apraxia and decreased initiation, decreased attention, decreased awareness, decreased problem solving, decreased safety awareness, decreased memory and delayed processing and therefore will continue to benefit from skilled PT intervention to increase functional independence with mobility.  Patient progressing toward long term goals..  Continue plan of care.  PT Short Term Goals Week 2:  PT Short Term Goal 1 (Week 2): Pt will perform bed mobility with modA PT Short Term Goal 2 (Week 2): Pt will maintain unsupported sitting EOB with modA PT Short Term Goal 3  (Week 2): Pt will perform transfers with LRAD and modA  Skilled Therapeutic Interventions/Progress Updates:    Pt received sitting in TIS w/c, agreeable to PT session. NT present who reports pt was incontinent of urine and needs assist for cleaning. Pt performed squat<>pivot transfer with maxA +2 from TIS w/c to EOB with maxA, sit>supine with maxA. TotalA for removing pants and NT present who assisted with pericare. Performed rolling L<>R with maxA of 1 with bed rail feature, unable to maintain sidelying position without maxA. Pt reporting urge for BM, therefore positioned bedpan however pt unsuccessful with time provided. Donned scrub pants with totalA +2, pt able to attempt bridging technique to raise over her bottom but required LLE blocking at her ankle/knee. Supine<>sit with maxA +1, required maxA initially to maintain sitting EOB while unsupported due to L lateral/posterior lean. With RUE to bedrail, she was able to maintain upright sitting with minA. Sliding board transfer with maxA +1 from EOB to TIS w/c, cues for head/hip and RUE placement. Wheeled to therapy gym with totalA for time management and performed sliding board transfer with maxA +1 from TIS w/c to mat table. Used large red wedge to assist with posterior propping while she was sitting at EOM. Mirror placed in front of her for visual feedback and performed targeted lateral reaching with RUE and handing to therapist over midline. Cues for upright sitting and efforts to not rely on posterior wedge but limited carryover. Sliding board transfer with maxA +1 back to her w/c and transported back to room where she remained seated in w/c with belt alarm on, needs in reach. Wedged pillows in w/c to assist with upright posture to reduce L lateral lean.  Therapy Documentation Precautions:  Precautions Precautions: Fall, Other (comment) Precaution Comments: L hemi, pushing tendency with RUE to  weak L side Restrictions Weight Bearing Restrictions:  No Vital Signs: Therapy Vitals Temp: 98.2 F (36.8 C) Pulse Rate: 86 Resp: 16 BP: (!) 160/84 Patient Position (if appropriate): Sitting Oxygen Therapy SpO2: 100 % O2 Device: Room Air   Therapy/Group: Individual Therapy  Lorenia Hoston P Sandi Towe PT 05/11/2020, 3:47 PM

## 2020-05-11 NOTE — Progress Notes (Signed)
Occupational Therapy Session Note  Patient Details  Name: [name] MRN: [id number] Date of Birth: [birth year]  Today's Date: 05/11/2020 OT Individual Time: 1300-1330 OT Individual Time Calculation (min): 30 min    Short Term Goals: Week 2:  OT Short Term Goal 1 (Week 2): Pt will be able to maintain static sit at EOB with mod A to prep for slide transfer. OT Short Term Goal 2 (Week 2): Pt will be able to self correct posture with mod cues and min A when she leans too far to the L. OT Short Term Goal 3 (Week 2): Pt will don shirt with mod A. OT Short Term Goal 4 (Week 2): pt will complete slide board transfer with max A of 1 to increase potential for toilet transfers.  Skilled Therapeutic Interventions/Progress Updates:    Pt sitting up in TIS w/c with dtr just leaving.  No c/o pain this session.  Pt reports she just finished lunch but hasnt brushed her teeth yet.  Pt transported to sinkside for oral care.  Pt needing Max step by step VCs to visually scan and grasp toothbrush and toothpaste on left side of counter.  Pt also needing step by step VCs with poor follow through to implement hemitechnique applying toothpaste to toothbrush due to pt very distracted with placing toothbrush under faucet without water turned on.  Assist needed to apply toothpaste and VCs to turn water on.  Pt brushed teeth needing mod assist to maintain midline trunk position due to pt significantly leaning to left with head turned to right.  Neuro re-ed completed in front of mirror working on midline righting and using RUE to pull self to center.  Pt able to right to center when asked to grab ADL item placed at right side, however when pt asked to look in mirror and "lean to right" pt unable to complete motor planning to achieve.  Left visual scanning task complete by placing various single ADL items on immediate left side and ask pt to identify what they are.  Pt needing increased time, repetitive VCs to successfully  turn head and visual scan to left to identify.  Call bell in reach, seat belt alarm on.  Therapy Documentation Precautions:  Precautions Precautions: Fall, Other (comment) Precaution Comments: L hemi, pushing tendency with RUE to weak L side Restrictions Weight Bearing Restrictions: No   Therapy/Group: Individual Therapy  Ezekiel Slocumb 05/11/2020, 4:35 PM

## 2020-05-11 NOTE — Patient Care Conference (Signed)
Inpatient RehabilitationTeam Conference and Plan of Care Update Date: 05/11/2020   Time: 11:21 AM    Patient Name: [name]      Medical Record Number: [id number]  Date of Birth: [birth year] Sex: Female         Room/Bed: 4M07C/4M07C-01 Payor Info: Payor: [employer]  / Plan: [employer] / Product Type: *No Product type* /    Admit Date/Time:  05/03/2020  3:56 PM  Primary Diagnosis:  CVA (cerebral vascular accident) [facility])  Hospital Problems: Principal Problem:   CVA (cerebral vascular accident) ([place]) Active Problems:   Pressure injury of skin   Thrombocytosis (HCC)   Leukocytosis   AKI (acute kidney injury) ([place])   Benign essential HTN   Lethargy   Dysphagia, post-stroke    Expected Discharge Date: Expected Discharge Date: 06/01/20  Team Members Present: Physician leading conference: Dr. Leeroy Cha Care Coodinator Present: Dorien Chihuahua, RN, BSN, CRRN;Other (comment) Jacqlyn Larsen Dupree, SW) Nurse Present: Other (comment) Abigail Miyamoto, RN) PT Present: Other (comment) Furniture conservator/restorer, PT) OT Present: Meriel Pica, OT SLP Present: Other (comment) Nadara Mode, SLP) PPS Coordinator present : Ileana Ladd, Burna Mortimer, SLP     Current Status/Progress Goal Weekly Team Focus  Bowel/Bladder   Incontinent of b/b, LBM 8/31  improve in continence  assess toileting q shift and prn   Swallow/Nutrition/ Hydration   Dys. 3 textures, thin liquids, Mod A  Supervision  tolerance of current diet, increase use of swallowing compensatory strategies   ADL's   total assist +2 EOB ADLs and functional transfers; right eye gaze; left neglect; pushing significantly  mod assist  ADL training, visual tracking, left attention, initiation, sequencing of functional tasks, neck stretch and positioning, sitting balance, functional transfers   Mobility   max/totalA  minA goals  sitting balance, pushers, transfers, improving energy levels as she falls asleep frequently during  sessions. Tooth assessment for MD???   Communication   perseveration/repetition of words, Max A to cease/correct, decreased intelligibility  Supervision  strategies to correct verbal repetitions, intelligibility   Safety/Cognition/ Behavioral Observations  Mod-Max  Supervision  basic familiar problem solving, sustained attention, emergent awareness   Pain   has c/o generalized pain and headache at times  pain less than 5  Assess pain q shift and prn   Skin   Bruising on abdomen, foam on sacrum  prevent further skin breakdown  assess skin q shift and prn     Discharge Planning:  HOme with family providing 24/7 care. Daughter here daily to see pt and provide suporrt.   Team Discussion: Continue to note lethargy. Daughter concerned about loose tooth; OP consult for dentist per MD. Pain in groin area managed with medication change per MD. Functional progress limited by tone, decreased attention to tasks, persevoration, lack of midline awareness, limited scanning to the left and pushing with activity.  Patient on target to meet rehab goals: yes, MOD assistance goals set  *See Care Plan and progress notes for long and short-term goals.   Revisions to Treatment Plan:  Encourage patient to talk out activities  Teaching Needs: Transfers, cues for attention to tasks needed, supervision for management of diet and toileting, medications, skin care, etc.  Current Barriers to Discharge: Insurance for SNF coverage  Possible Resolutions to Barriers: Family education with daughters and significant other     Medical Summary Current Status: left leg spasticity, insomnia improved with dantrolene, right tooth pain, stage 2 sacral injury, pelvic pain improved with advil, anxiety, dysphagia  Barriers  to Discharge: Decreased family/caregiver support;Wound care;Medical stability;Medication compliance;Behavior  Barriers to Discharge Comments: left leg spasticity, stage 2 sacral injury, pelvic pain, MaxA  transfers/requires 24/7 supervision, anxiety, dysphagia Possible Resolutions to [employer] Focus: increase dantrolene, continue prn advil for pelvic pain, continue wound care, caregiver training of daughters, low dose daily prn klonopin started   Continued Need for Acute Rehabilitation Level of Care: The patient requires daily medical management by a physician with specialized training in physical medicine and rehabilitation for the following reasons: Direction of a multidisciplinary physical rehabilitation program to maximize functional independence : Yes Medical management of patient stability for increased activity during participation in an intensive rehabilitation regime.: Yes Analysis of laboratory values and/or radiology reports with any subsequent need for medication adjustment and/or medical intervention. : Yes   I attest that I was present, lead the team conference, and concur with the assessment and plan of the team.   Dorien Chihuahua B 05/11/2020, 2:29 PM

## 2020-05-11 NOTE — Plan of Care (Signed)
  Problem: RH Awareness Goal: LTG: Patient will demonstrate awareness during functional activites type of (SLP) Description: LTG: Patient will demonstrate awareness during functional activites type of (SLP) Flowsheets (Taken 05/11/2020 0647) LTG: Patient will demonstrate awareness during cognitive/linguistic activities with assistance of (SLP): Minimal Assistance - Patient > 75% Note: Downgraded due to current level of progress   Problem: RH Problem Solving Goal: LTG Patient will demonstrate problem solving for (SLP) Description: LTG:  Patient will demonstrate problem solving for basic/complex daily situations with cues  (SLP) 05/11/2020 0647 by Buzzy Han, CCC-SLP Flowsheets (Taken 05/11/2020 [id number]) LTG: Patient will demonstrate problem solving for (SLP): Basic daily situations LTG Patient will demonstrate problem solving for: Moderate Assistance - Patient 50 - 74% Note: Goal Initiated  05/11/2020 0647 by Buzzy Han, [place]

## 2020-05-11 NOTE — Progress Notes (Signed)
Patient ID: [name], female   DOB: 10/16/1960, 59 y.o.   MRN: [id number]   Met with pt and daughter-[name] who was in her room to discuss team conference goals min-mod wheelchair level and target discharge date 9/22. Pt feels she has done much better this week and is more alert and able to participate more in therapies. She will have family there to assist her even when [name] works on the weekends. Will continue to work on discharge needs and follow along. Will have daughter start the SSD process.  

## 2020-05-12 ENCOUNTER — Inpatient Hospital Stay (HOSPITAL_COMMUNITY): Payer: 59 | Admitting: Occupational Therapy

## 2020-05-12 ENCOUNTER — Inpatient Hospital Stay (HOSPITAL_COMMUNITY): Payer: 59

## 2020-05-12 ENCOUNTER — Inpatient Hospital Stay (HOSPITAL_COMMUNITY): Payer: 59 | Admitting: Speech Pathology

## 2020-05-12 LAB — BASIC METABOLIC PANEL
Anion gap: 11 (ref 5–15)
BUN: 18 mg/dL (ref 6–20)
CO2: 24 mmol/L (ref 22–32)
Calcium: 9.6 mg/dL (ref 8.9–10.3)
Chloride: 98 mmol/L (ref 98–111)
Creatinine, Ser: 1.79 mg/dL — ABNORMAL HIGH (ref 0.44–1.00)
GFR calc Af Amer: 36 mL/min — ABNORMAL LOW (ref >60)
GFR calc non Af Amer: 31 mL/min — ABNORMAL LOW (ref >60)
Glucose, Bld: 101 mg/dL — ABNORMAL HIGH (ref 70–99)
Potassium: 3.9 mmol/L (ref 3.5–5.1)
Sodium: 133 mmol/L — ABNORMAL LOW (ref 135–145)

## 2020-05-12 NOTE — Progress Notes (Addendum)
[facility] PHYSICAL MEDICINE & REHABILITATION PROGRESS NOTE  Subjective/Complaints: Left leg still with spasticity Has been eating "ok" Still with pain in groin- advil helps Therapy went very well today   ROS:  Pt denies SOB, abd pain, CP, N/V/C/D, and vision changes   Objective: Vital Signs: Blood pressure 139/75, pulse 89, temperature 97.8 F (36.6 C), temperature source Oral, resp. rate 17, height 5\' 3"  (1.6 m), weight 67.2 kg, last menstrual period 09/15/2013, SpO2 100 %. No results found. No results for input(s): WBC, HGB, HCT, PLT in the last 72 hours. Recent Labs    05/10/20 0629  NA 135  K 3.3*  CL 101  CO2 23  GLUCOSE 106*  BUN 17  CREATININE 1.83*  CALCIUM 9.4    Physical Exam: BP 139/75 (BP Location: Right Arm)   Pulse 89   Temp 97.8 F (36.6 C) (Oral)   Resp 17   Ht 5\' 3"  (1.6 m)   Wt 67.2 kg   LMP 09/15/2013   SpO2 100%   BMI 26.24 kg/m    General: Alert and oriented x 3, No apparent distress HEENT: Head is normocephalic, atraumatic, PERRLA, EOMI, sclera anicteric, oral mucosa pink and moist, dentition intact, ext ear canals clear,  Neck: Supple without JVD or lymphadenopathy Heart: Reg rate and rhythm. No murmurs rubs or gallops Chest: CTA bilaterally without wheezes, rales, or rhonchi; no distress Abdomen: Soft, non-tender, non-distended, bowel sounds positive. Extremities: No clubbing, cyanosis, or edema. Pulses are 2+ Skin: Clean and intact without signs of breakdown Psych: Perseverative, slowed- no change Musc: No edema in extremities.  No tenderness in extremities. Neuro: much more awake Left facial weakness Dysarthria, unchanged Motor: LUE/LE: 0/5 proximal distal Increase in tone noted left side MAS of 3-4 noticed in L hip and a little bit less in L knee- very hard to move through ROM and painful for pt.     Assessment/Plan: 1. Functional deficits secondary to right ACA infarction which require 3+ hours per day of interdisciplinary  therapy in a comprehensive inpatient rehab setting.  Physiatrist is providing close team supervision and 24 hour management of active medical problems listed below.  Physiatrist and rehab team continue to assess barriers to discharge/monitor patient progress toward functional and medical goals  Care Tool:  Bathing    Body parts bathed by patient: Left arm, Chest, Abdomen, Right upper leg, Left upper leg, Face, Right lower leg, Front perineal area   Body parts bathed by helper: Right arm, Left lower leg, Buttocks, Front perineal area     Bathing assist Assist Level: Moderate Assistance - Patient 50 - 74% (bed level LB, w/c UB)     Upper Body Dressing/Undressing Upper body dressing   What is the patient wearing?: Pull over shirt    Upper body assist Assist Level: Maximal Assistance - Patient 25 - 49% (w/c level)    Lower Body Dressing/Undressing Lower body dressing      What is the patient wearing?: Incontinence brief, Pants     Lower body assist Assist for lower body dressing: Maximal Assistance - Patient 25 - 49% (bed level)     Toileting Toileting    Toileting assist Assist for toileting: Total Assistance - Patient < 25%     Transfers Chair/bed transfer  Transfers assist     Chair/bed transfer assist level: 2 Helpers (slide board, total A)     Locomotion Ambulation   Ambulation assist   Ambulation activity did not occur: Safety/medical concerns  Walk 10 feet activity   Assist  Walk 10 feet activity did not occur: Safety/medical concerns        Walk 50 feet activity   Assist Walk 50 feet with 2 turns activity did not occur: Safety/medical concerns         Walk 150 feet activity   Assist Walk 150 feet activity did not occur: Safety/medical concerns         Walk 10 feet on uneven surface  activity   Assist Walk 10 feet on uneven surfaces activity did not occur: Safety/medical concerns          Wheelchair     Assist Will patient use wheelchair at discharge?: Yes Type of Wheelchair: Manual Wheelchair activity did not occur: Safety/medical concerns         Wheelchair 50 feet with 2 turns activity    Assist    Wheelchair 50 feet with 2 turns activity did not occur: Safety/medical concerns       Wheelchair 150 feet activity     Assist Wheelchair 150 feet activity did not occur: Safety/medical concerns          Medical Problem List and Plan: 1.Left side hemiparesis, now with spasticity and 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 Antithrombotics: -DVT/anticoagulation:Lovenox -antiplatelet therapy: Aspirin 325 mg daily and Plavix 75 mg daily x3 weeks then aspirin alone 3. Pain Management:Tylenol as needed.   8/31: added voltaren gel for bilateral knee OA.  9/1: Ibuprofen added for pain and this has helped.  4. Mood:Provide emotional support  Amantadine with breakfast and lunch started on 8/25  Per daughter, somnolence is baseline with patient with inconsistent/erratic sleep/wake patterns, often falling asleep if not engaged in activities prior to admission. Melatonin started 8/29  9/1: Klonopin 0.25mg  daily prn added for anxiety.  -antipsychotic agents: N/A 5. Neuropsych: This patientis ?capable of making decisions on herown behalf. 6. Skin/Wound Care:Routine skin checks. May d/c IV- order placed  Prevalon boots ordered 7. Fluids/Electrolytes/Nutrition:Routine in and outs. Discussed with SLP- still having difficulty eating foods. Daughter has been bringing healthy foods at ho,e.  8. Hypertension:   Hydralazine - increased to 100mg  TID on 8/26, Norvasc 10 mg daily.   Elevated 8/28: start Imdur 15mg  daily.   Elevated 8/29: Increase Imdur to 30mg   8/30- BP 186/95- will give 1 day for BP meds to show a change  9/1: BP elevated. Clonidine 0.1mg  daily added- will also help with tone.   9/2: much  better controlled  Monitor with increased mobility 9. Hyperlipidemia. Continue Lipitor 10. AKI on CKD. Normal renal function 2017 but no recent visits, does not have a PCP.   Cr. 1.88 on 8/25  Repeat 9/2 11. Leukocytosis- U/A (-)  WBCs 11.2 on 8/25  8/30- WBC 10.3  Afebrile 12. 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 Spasticity- getting worse in LLE-   8/31: started Dantrolene HS.  35. Patient's family concerned about tooth discomfort: difficult for me to assess on exam. Discussed that we will set her up for outpatient dentist follow-up. It is not painful for her but food gets stuck. Discussed soft D3 diet.  15. Hypokalemia: repeat BMP today    LOS: 9 days A FACE TO FACE EVALUATION WAS PERFORMED  Autumne Kallio P Comern­o Paone 05/12/2020, 2:20 PM

## 2020-05-12 NOTE — Progress Notes (Signed)
Physical Therapy Session Note  Patient Details  Name: [name] MRN: [id number] Date of Birth: 1960/12/28  Today's Date: 05/12/2020 PT Individual Time: 1000-1100 PT Individual Time Calculation (min): 60 min   Short Term Goals: Week 2:  PT Short Term Goal 1 (Week 2): Pt will perform bed mobility with modA PT Short Term Goal 2 (Week 2): Pt will maintain unsupported sitting EOB with modA PT Short Term Goal 3 (Week 2): Pt will perform transfers with LRAD and modA  Skilled Therapeutic Interventions/Progress Updates:    Pt received sitting reclined in TIS w/c, awake and agreeable to PT session. Pt recognizes therapist without cueing. She appeared to have improved sitting posture in w/c with better midline posturing. Pt transported with totalA in w/c to therapy gym for time management and energy conservation. Placed patient in standing frame where she stood x4 bouts ranging from 8-10 minutes each stand. Mirror placed in front for visual feedback. Cueing throughout for midline orientation with L active cervical rotation and reducing L lateral lean. While standing in frame, performed targeted reaching with RUE with cones focusing on improving R weight shift and also midline reaching to facilitate L attention. Played music by [place] during session which improved patient alertness and participation, she was thankful. Pt transported back to her room with totalA in TIS w/c, remained seated in chair with belt alarm on, lap tray in place, feet supported with platform. Pillow's wedged under L hip/trunk to facilitate midline.  Therapy Documentation Precautions:  Precautions Precautions: Fall, Other (comment) Precaution Comments: L hemi, pushing tendency with RUE to weak L side Restrictions Weight Bearing Restrictions: No Vital Signs: Therapy Vitals BP: (!) 150/76   Therapy/Group: Individual Therapy  Javione Gunawan P Melenie Minniear PT 05/12/2020, 12:09 PM

## 2020-05-12 NOTE — Progress Notes (Signed)
Occupational Therapy Session Note  Patient Details  Name: [name] MRN: [id number] Date of Birth: 03-18-1961  Today's Date: 05/12/2020 OT Individual Time: 2751-7001 OT Individual Time Calculation (min): 60 min    Short Term Goals: Week 2:  OT Short Term Goal 1 (Week 2): Pt will be able to maintain static sit at EOB with mod A to prep for slide transfer. OT Short Term Goal 2 (Week 2): Pt will be able to self correct posture with mod cues and min A when she leans too far to the L. OT Short Term Goal 3 (Week 2): Pt will don shirt with mod A. OT Short Term Goal 4 (Week 2): pt will complete slide board transfer with max A of 1 to increase potential for toilet transfers.  Skilled Therapeutic Interventions/Progress Updates:    Pt received in bed with a very wet brief.  Pt worked on rolling with mod A and max cues for therapist to doff brief, cleanse pt, don new brief and don pants. Pt did assist with cleansing her front perineal area and helped to pull pants over hips on R side.   Pt placed in supine without pillow to focus on L scapular PROM/gliding with no restrictions, ext rotation, elbow and wrist extension. Despite high tone and flexion synergy, full PROM can be achieved.  Pt worked on self ROM with mod A with hands clasped to reach hands to ceiling.  Tried hand drop exercise but no tonal response in elb extensors.  +2 A arrived with therapy tech. Pt rolled to side to R with mod A and then sidelying to sit mod A.  In sitting with tech on her L on bed and therapist on her R, spent quite a bit of time focused on static sitting.  Pt leans to L with ABSENT protective responses and she tends to use her R hand to push to the left.   Using long mirror for feedback,  Focused on pt leaning to her R using therapist as a visual reference, holding hands clasped in the center to avoid pushing. Pt needs max A to maintain balance but was able to sit statically without A for 2-3 seconds at the  most.  Transferred via sliding board to w/c - total +2  In wc at sink, UB bathing mod, UB dressing max.  Positioned with lap tray, belt alarm.  Resting in wc with all needs met.  Therapy Documentation Precautions:  Precautions Precautions: Fall, Other (comment) Precaution Comments: L hemi, pushing tendency with RUE to weak L side Restrictions Weight Bearing Restrictions: No    Vital Signs: Therapy Vitals BP: (!) 150/76 Pain: Pain Assessment Faces Pain Scale: Hurts even more Pain Type: Chronic pain Pain Location: Groin Pain Orientation: Left Pain Descriptors / Indicators: Aching Pain Onset: On-going Pain Intervention(s): Repositioned ADL: ADL Eating: Moderate assistance Where Assessed-Eating: Wheelchair Grooming: Moderate assistance Where Assessed-Grooming: Wheelchair Upper Body Bathing: Moderate assistance Where Assessed-Upper Body Bathing: Wheelchair Lower Body Bathing: Moderate assistance Where Assessed-Lower Body Bathing: Bed level Upper Body Dressing: Maximal assistance Where Assessed-Upper Body Dressing: Wheelchair Lower Body Dressing: Maximal assistance Where Assessed-Lower Body Dressing: Bed level Toileting: Dependent Where Assessed-Toileting: Bed level Toilet Transfer: Dependent Toilet Transfer Method: Not assessed Toilet Transfer Equipment: Bedside commode  Therapy/Group: Individual Therapy  Javon Snee 05/12/2020, 12:32 PM

## 2020-05-13 ENCOUNTER — Inpatient Hospital Stay (HOSPITAL_COMMUNITY): Payer: 59 | Admitting: Occupational Therapy

## 2020-05-13 ENCOUNTER — Inpatient Hospital Stay (HOSPITAL_COMMUNITY): Payer: 59 | Admitting: Physical Therapy

## 2020-05-13 ENCOUNTER — Inpatient Hospital Stay (HOSPITAL_COMMUNITY): Payer: 59 | Admitting: Speech Pathology

## 2020-05-13 NOTE — Plan of Care (Signed)
  Problem: Consults Goal: RH STROKE PATIENT EDUCATION Description: See Patient Education module for education specifics  Outcome: Progressing Goal: Nutrition Consult-if indicated Outcome: Progressing Goal: Diabetes Guidelines if Diabetic/Glucose > 140 Description: If diabetic or lab glucose is > 140 mg/dl - Initiate Diabetes/Hyperglycemia Guidelines & Document Interventions  Outcome: Progressing   Problem: RH BOWEL ELIMINATION Goal: RH STG MANAGE BOWEL WITH ASSISTANCE Description: STG Manage Bowel with Mod I Assistance. Outcome: Progressing Goal: RH STG MANAGE BOWEL W/MEDICATION W/ASSISTANCE Description: STG Manage Bowel with Medication with Mod I Assistance. Outcome: Progressing   Problem: RH BLADDER ELIMINATION Goal: RH STG MANAGE BLADDER WITH ASSISTANCE Description: STG Manage Bladder With Mod I Assistance Outcome: Progressing   Problem: RH SKIN INTEGRITY Goal: RH STG SKIN FREE OF INFECTION/BREAKDOWN Description: Mod I Outcome: Progressing   Problem: RH SAFETY Goal: RH STG ADHERE TO SAFETY PRECAUTIONS W/ASSISTANCE/DEVICE Description: STG Adhere to Safety Precautions With Mod I Assistance/Device. Outcome: Progressing   Problem: RH PAIN MANAGEMENT Goal: RH STG PAIN MANAGED AT OR BELOW PT'S PAIN GOAL Description: <2 Outcome: Progressing   Problem: RH KNOWLEDGE DEFICIT Goal: RH STG INCREASE KNOWLEDGE OF HYPERTENSION Description: Patient will be able to manage HTN with medications and dietary restrictions using handouts and educational information independently Outcome: Progressing Goal: RH STG INCREASE KNOWLEDGE OF STROKE PROPHYLAXIS Description: Patient will acknowledge understanding of secondary stroke risks and management with stroke prophylaxis medications using handouts and educational materials independently Outcome: Progressing

## 2020-05-13 NOTE — Progress Notes (Signed)
Speech Language Pathology Daily Session Note  Patient Details  Name: [name] MRN: [id number] Date of Birth: [birth year]  Today's Date: 05/13/2020 SLP Individual Time: 0725-0820 SLP Individual Time Calculation (min): 55 min  Short Term Goals: Week 2: SLP Short Term Goal 1 (Week 2): Patient will consume current diet of Dys. 3 textures with thin liquids with minimal overt s/s of aspiration and Min A vebral cues for use of swallowing compensatory strategies. SLP Short Term Goal 2 (Week 2): Patient will demonstrate sustained attention to functional tasks for ~10 minutes with Mod A verbal cues for redirection. SLP Short Term Goal 3 (Week 2): Patient will scan to left visual field during functional tasks in 75% of opportunities with Mod A multimodal cues. SLP Short Term Goal 4 (Week 2): Patient will demonstrate functional problem solving for basic and familiar tasks with Mod A verbal cues. SLP Short Term Goal 5 (Week 2): Patient will utilize speech intelligibility strateies at the sentence level to achieve ~90% intelligibility with Min A verbal cues.  Skilled Therapeutic Interventions: Skilled treatment session focused on cognitive and dysphagia goals. SLP facilitated session by providing Mod A verbal and visual cues for cessation of functional tasks like washing her face and performing oral care via the suction toothbrush. Patient utilized her schedule with extra time and overall Mod A verbal cues for visual scanning to locate specific times and types of therapy sessions. Patient consumed her breakfast meal without overt s/s of aspiration with the exception of a cough X 1 due to attempting to talk prior to initiating a swallow. Recommend patient continue current diet. Patient also scanned to left visual field to locate drinks and other items in her left visual field with supervision verbal cues. Patient left upright in bed with alarm on and all needs within reach. Continue with current plan of  care.      Pain No/Denies Pain   Therapy/Group: Individual Therapy  Damariz Paganelli 05/13/2020, 8:38 AM

## 2020-05-13 NOTE — Progress Notes (Signed)
Occupational Therapy Session Note  Patient Details  Name: [name] MRN: [id number] Date of Birth: 04-26-1961  Today's Date: 05/13/2020 OT Individual Time: 2703-5009 OT Individual Time Calculation (min): 27 min    Short Term Goals: Week 2:  OT Short Term Goal 1 (Week 2): Pt will be able to maintain static sit at EOB with mod A to prep for slide transfer. OT Short Term Goal 2 (Week 2): Pt will be able to self correct posture with mod cues and min A when she leans too far to the L. OT Short Term Goal 3 (Week 2): Pt will don shirt with mod A. OT Short Term Goal 4 (Week 2): pt will complete slide board transfer with max A of 1 to increase potential for toilet transfers.  Skilled Therapeutic Interventions/Progress Updates:    Pt sitting up in the wheelchair with her daughter present.  Pt requests to transfer back to the bed as she has been up for a while and reports being uncomfortable.  She continues to demonstrate left head tilt with right head rotation while sitting up.  Mod assist is needed for correction to midline with pt voicing pain in the left side of her neck as well as some ongoing pain down her entire left side, but it did not affect this therapy session.  She was able to complete scooting to the EOC with max assist in preparation for transfer to the bed.  She was unable efficiently move the sound right side when told to scoot, and instead just repeated what therapist had instructed her to do.  Max assist for sit to stand with total assist for stand pivot to the bed.  Total assist was needed for advancement of the LLE during transfer with max to total for stabilizing the left knee when weightbearing on it.  Once on the EOB, had her focus on static sitting balance.  Increased pushing to the left is still present with LOB to the left requiring max assist to correct.  She needed max assist for transition to supine as well with the LUE and LLE positioned on pillows for support.  Educated  pt's daughter on having pt turn her head to the left for engagement in conversation as well as if she is watching TV.  Therapist repositioned bed to increase looking left to locate the TV as well.  Call button and phone in reach with bed alarm in place.    Therapy Documentation Precautions:  Precautions Precautions: Fall, Other (comment) Precaution Comments: L hemi, pushing tendency with RUE to weak L side Restrictions Weight Bearing Restrictions: No  Pain: Pain Assessment Pain Scale: Faces Pain Score: 0-No pain Faces Pain Scale: Hurts a little bit Pain Type: Acute pain Pain Location: Neck Pain Orientation: Left Pain Descriptors / Indicators: Discomfort Pain Onset: On-going Pain Intervention(s): Repositioned Multiple Pain Sites: No ADL: See Care Tool Section for some details of mobility and selfcare  Therapy/Group: Individual Therapy  Lopez Dentinger OTR/L 05/13/2020, 4:50 PM

## 2020-05-13 NOTE — Progress Notes (Signed)
Occupational Therapy Session Note  Patient Details  Name: [name] MRN: [id number] Date of Birth: Mar 20, 1961  Today's Date: 05/13/2020 OT Individual Time: 5997-7414 OT Individual Time Calculation (min): 70 min    Short Term Goals: Week 2:  OT Short Term Goal 1 (Week 2): Pt will be able to maintain static sit at EOB with mod A to prep for slide transfer. OT Short Term Goal 2 (Week 2): Pt will be able to self correct posture with mod cues and min A when she leans too far to the L. OT Short Term Goal 3 (Week 2): Pt will don shirt with mod A. OT Short Term Goal 4 (Week 2): pt will complete slide board transfer with max A of 1 to increase potential for toilet transfers.  Skilled Therapeutic Interventions/Progress Updates:    Pt received in bed with her daughter present. Pt had a dry brief on, but pt had an odor.  Pt worked on rolling with mod A and max cues for therapist to doff brief, cleanse pt, don new brief.  Prior to donning new brief pt was incontinent of urine in bed.  RN arrived to assist with +2 to don brief and don pants. Pt did assist with cleansing her front perineal area and helped to pull pants over hips on R side.   Transferred via sliding board to w/c - total +2  In wc at sink, UB bathing mod, UB dressing max.   Spent time educating her dtr, [name], on hemi self care strategies, PROM, stretching, positioning.  [name] had a lot of good questions and we discussed the primary needs are for pt to become more mobile to decrease the burden of care at home.  Pt did attend much better today and was able to focus on each task with fewer cues.     Positioned with lap tray, belt alarm.  Resting in wc with all needs met.  Therapy Documentation Precautions:  Precautions Precautions: Fall, Other (comment) Precaution Comments: L hemi, pushing tendency with RUE to weak L side Restrictions Weight Bearing Restrictions: No General:   Vital Signs:  Pain: Pain Assessment Pain  Scale: 0-10 Pain Score: 0-No pain ADL: ADL Eating: Moderate assistance Where Assessed-Eating: Wheelchair Grooming: Moderate assistance Where Assessed-Grooming: Wheelchair Upper Body Bathing: Moderate assistance Where Assessed-Upper Body Bathing: Wheelchair Lower Body Bathing: Moderate assistance Where Assessed-Lower Body Bathing: Bed level Upper Body Dressing: Maximal assistance Where Assessed-Upper Body Dressing: Wheelchair Lower Body Dressing: Maximal assistance Where Assessed-Lower Body Dressing: Bed level Toileting: Dependent Where Assessed-Toileting: Bed level Toilet Transfer: Dependent Toilet Transfer Method: Not assessed Toilet Transfer Equipment: Bedside commode  Therapy/Group: Individual Therapy  Yahya Boldman 05/13/2020, 12:53 PM

## 2020-05-13 NOTE — Progress Notes (Signed)
Speech Language Pathology Daily Session Note (Late entry for 05/12/20 session)  Patient Details  Name: [name] MRN: [id number] Date of Birth: 14-Jan-1961 Today's Date: 05/12/2020 SLP Individual Time: 1300-1400  Short Term Goals: Week 2: SLP Short Term Goal 1 (Week 2): Patient will consume current diet of Dys. 3 textures with thin liquids with minimal overt s/s of aspiration and Min A vebral cues for use of swallowing compensatory strategies. SLP Short Term Goal 2 (Week 2): Patient will demonstrate sustained attention to functional tasks for ~10 minutes with Mod A verbal cues for redirection. SLP Short Term Goal 3 (Week 2): Patient will scan to left visual field during functional tasks in 75% of opportunities with Mod A multimodal cues. SLP Short Term Goal 4 (Week 2): Patient will demonstrate functional problem solving for basic and familiar tasks with Mod A verbal cues. SLP Short Term Goal 5 (Week 2): Patient will utilize speech intelligibility strateies at the sentence level to achieve ~90% intelligibility with Min A verbal cues.  Skilled Therapeutic Interventions:   Patient seen for skilled ST with focus on cognitive-linguistic goals. Patient had just finished lunch when SLP arrived. She performed oral care with toothette sponge and swishing and spitting water with setup assist and verbal cues for attention. She would frequently and suddenly close eyes and required mod-maximal verbal cues for brief periods of alertness. She was oriented to self, place; correctly reported room number as "[id number]". She was one day off for time orientation, stating September 1, Wednesday 2021, however patient's calendar in room had not been marked off correctly today. She required maximal verbal, visual and tactile cues to attend to pictures, photos and keyboard keys on left side, with SLP tapping at keyboard keys with pencil and providing some hand over hand to initiate moving hand, finger pointing to left side.  She exhibited significant decreased in frequency of repeating what she or other people said. Patient continues to benefit from skilled ST intervention to focus on speech, language, cognitive and dysphagia goals.   Pain Pain Assessment Pain Scale: 0-10 Pain Score: 0-No pain  Therapy/Group: Individual Therapy  Sonia Baller, MA, CCC-SLP Speech Therapy

## 2020-05-13 NOTE — Progress Notes (Signed)
Physical Therapy Session Note  Patient Details  Name: [name] MRN: [id number] Date of Birth: 11-09-1960  Today's Date: 05/13/2020 PT Individual Time: 1653-1736 PT Individual Time Calculation (min): 43 min   Short Term Goals: Week 2:  PT Short Term Goal 1 (Week 2): Pt will perform bed mobility with modA PT Short Term Goal 2 (Week 2): Pt will maintain unsupported sitting EOB with modA PT Short Term Goal 3 (Week 2): Pt will perform transfers with LRAD and modA  Skilled Therapeutic Interventions/Progress Updates:    Pt received supine in bed finishing hygiene management with nursing. Pt agreeable to therapy session. Supine>sitting L EOB, HOB slightly elevated and using bedrail, max multimodal cuing for sequencing to increase pt independent - mod assist for rolling then heavy max/total assist for trunk upright (pt unable to use L UE to support trunk due to flexor tone) and due to impaired trunk control and poor midline awareness had strong L trunk lean (cuing to grap footboard with R hand to improve midline orientation. Sitting EOB with R lateral trunk lean onto forearm support able to sustain static sitting balance with CGA. R squat pivot EOB>w/c with +2 max assist for lifting/pivoting hips and trunk control.  Transported to/from gym in w/c for time management and energy conservation. Sit<>stand dependently using standing frame. Tolerated ~28minutes in standing frame working on improve midline orientation with mirror feedback, upright trunk posture, L attention and visual scanning, and reaching tasks - therapist manually facilitating trunk alignment and providing assist as needed due to pt intermittently having anterior trunk LOB into standing frame table when reaching. - due to pt's genu varus LE alignment her R knee does not fit well on standing frame pad but pt denies discomfort with this. After standing pt reports increased L LE groin pain. Transported back to room and nursing staff  requesting pt return to bed. L squat pivot w/c>EOB with +2 max assist for lifting/pivoting hips. Sitting balance EOB ~51minutes working on midline orientation, L visual scanning, and reaching - pt demos ability to grasp items from her lap and reach R to place them slightly outside BOS with CGA - though with fatigue or distractions pt has posterior LOB requiring max/total assist to maintain upright - pt demos improving trunk control compared to last time this therapist saw patient. Sit>supine with max assist for trunk descent and L hemibody management - max multimodal cuing for sequencing to increase pt independence. Assessed pt's reports of L LE groin pain via performing passive L hip abduction with pt noted to have increased tone in adductors - performed gentle hip abduction/adduction PROM followed by 2x51minute hip adductor stretch. Pt left supine in bed with needs in reach and bed alarm on.  Therapy Documentation Precautions:  Precautions Precautions: Fall, Other (comment) Precaution Comments: L hemi, pushing tendency with RUE to weak L side Restrictions Weight Bearing Restrictions: No  Pain: Reports her "left side hurts" and with clarification questions reports it is her L groin - reports increased pain with activity - provided distractions and repositioning during session followed by gentle stretching at end for pain management - notified nursing staff for medication administration.   Therapy/Group: Individual Therapy  Tawana Scale , PT, DPT, CSRS  05/13/2020, 3:38 PM

## 2020-05-13 NOTE — Progress Notes (Signed)
[place] PHYSICAL MEDICINE & REHABILITATION PROGRESS NOTE  Subjective/Complaints: BP 155/7 this morning. Yesterday Na was 133- trending downward and Creatinine was 1.79- trending downward    ROS:  Pt denies SOB, abd pain, CP, N/V/C/D, and vision changes   Objective: Vital Signs: Blood pressure (!) 155/74, pulse 87, temperature 98 F (36.7 C), resp. rate 18, height 5\' 3"  (1.6 m), weight 67.2 kg, last menstrual period 09/15/2013, SpO2 100 %. No results found. No results for input(s): WBC, HGB, HCT, PLT in the last 72 hours. Recent Labs    05/12/20 1858  NA 133*  K 3.9  CL 98  CO2 24  GLUCOSE 101*  BUN 18  CREATININE 1.79*  CALCIUM 9.6    Physical Exam: BP (!) 155/74 (BP Location: Right Arm)   Pulse 87   Temp 98 F (36.7 C)   Resp 18   Ht 5\' 3"  (1.6 m)   Wt 67.2 kg   LMP 09/15/2013   SpO2 100%   BMI 26.24 kg/m  General: Alert and oriented x 3, No apparent distress HEENT: Head is normocephalic, atraumatic, PERRLA, EOMI, sclera anicteric, oral mucosa pink and moist, dentition intact, ext ear canals clear,  Neck: Supple without JVD or lymphadenopathy Heart: Reg rate and rhythm. No murmurs rubs or gallops Chest: CTA bilaterally without wheezes, rales, or rhonchi; no distress Abdomen: Soft, non-tender, non-distended, bowel sounds positive. Extremities: No clubbing, cyanosis, or edema. Pulses are 2+ Psych: Perseverative, slowed- no change Musc: No edema in extremities.  No tenderness in extremities. Neuro: much more awake Left facial weakness Dysarthria, unchanged Motor: LUE/LE: 0/5 proximal distal Increase in tone noted left side MAS of 3-4 noticed in L hip and a little bit less in L knee- very hard to move through ROM and painful for pt.   Assessment/Plan: 1. 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  Physiatrist is providing close team  supervision and 24 hour management of active medical problems listed below.  Physiatrist and rehab team continue to assess barriers to discharge/monitor patient progress toward functional and medical goals  Care Tool:  Bathing    Body parts bathed by patient: Left arm, Chest, Abdomen, Right upper leg, Left upper leg, Face, Right lower leg, Front perineal area   Body parts bathed by helper: Right arm, Left lower leg, Buttocks, Front perineal area     Bathing assist Assist Level: Moderate Assistance - Patient 50 - 74% (bed level LB, w/c UB)     Upper Body Dressing/Undressing Upper body dressing   What is the patient wearing?: Pull over shirt    Upper body assist Assist Level: Maximal Assistance - Patient 25 - 49% (w/c level)    Lower Body Dressing/Undressing Lower body dressing      What is the patient wearing?: Incontinence brief, Pants     Lower body assist Assist for lower body dressing: Maximal Assistance - Patient 25 - 49% (bed level)     Toileting Toileting    Toileting assist Assist for toileting: Total Assistance - Patient < 25%     Transfers Chair/bed transfer  Transfers assist     Chair/bed transfer assist level: 2 Helpers (slide board, total A)     Locomotion Ambulation   Ambulation assist   Ambulation activity did not occur: Safety/medical concerns          Walk 10 feet activity   Assist  Walk 10 feet activity did  not occur: Safety/medical concerns        Walk 50 feet activity   Assist Walk 50 feet with 2 turns activity did not occur: Safety/medical concerns         Walk 150 feet activity   Assist Walk 150 feet activity did not occur: Safety/medical concerns         Walk 10 feet on uneven surface  activity   Assist Walk 10 feet on uneven surfaces activity did not occur: Safety/medical concerns         Wheelchair     Assist Will patient use wheelchair at discharge?: Yes Type of Wheelchair:  Manual Wheelchair activity did not occur: Safety/medical concerns         Wheelchair 50 feet with 2 turns activity    Assist    Wheelchair 50 feet with 2 turns activity did not occur: Safety/medical concerns       Wheelchair 150 feet activity     Assist Wheelchair 150 feet activity did not occur: Safety/medical concerns          Medical Problem List and Plan: 1.Left side hemiparesis, now with spasticity and 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 Antithrombotics: -DVT/anticoagulation:Lovenox -antiplatelet therapy: Aspirin 325 mg daily and Plavix 75 mg daily x3 weeks then aspirin alone 3. Pain Management:Tylenol and Advil as needed for groin and knee pain, voltaren gel for knee pain 4. Mood:Provide emotional support  Amantadine with breakfast and lunch started on 8/25  Per daughter, somnolence is baseline with patient with inconsistent/erratic sleep/wake patterns, often falling asleep if not engaged in activities prior to admission.   Melatonin started 8/29 to reset circadian rhythm.  9/1: Klonopin 0.25mg  daily prn added for anxiety.  -antipsychotic agents: N/A 5. Neuropsych: This patientis ?capable of making decisions on herown behalf. 6. Skin/Wound Care:Routine skin checks. IV d/ced  Prevalon boots ordered 7. Fluids/Electrolytes/Nutrition:Routine in and outs. Discussed with SLP- still having difficulty eating foods. Daughter has been bringing healthy foods at ho,e.  8. Hypertension:   Hydralazine - increased to 100mg  TID on 8/26, Norvasc 10 mg daily.   Imdur 30mg  and Clonidine 0.1mg  BID also added, now with better control.  Monitor with increased mobility 9. Hyperlipidemia. Continue Lipitor 10. AKI on CKD. Normal renal function 2017 but no recent visits, does not have a PCP.   Cr. 1.88 on 8/25, trending down on 9/2 11. Leukocytosis- U/A (-)  WBCs 11.2 on 8/25  8/30- WBC 10.3  Afebrile 12.  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 Spasticity- worse in LLE   8/31: started Dantrolene HS.  11. Patient's family concerned about tooth discomfort: difficult for me to assess on exam. Discussed that we will set her up for outpatient dentist follow-up. It is not painful for her but food gets stuck. Discussed soft D3 diet.  15. Hypokalemia: normalized 9/2.     LOS: 10 days A FACE TO FACE EVALUATION WAS PERFORMED  Clide Deutscher Cylis Ayars 05/13/2020, 8:23 AM

## 2020-05-14 ENCOUNTER — Inpatient Hospital Stay (HOSPITAL_COMMUNITY): Payer: 59 | Admitting: Physical Therapy

## 2020-05-14 ENCOUNTER — Inpatient Hospital Stay (HOSPITAL_COMMUNITY): Payer: 59

## 2020-05-14 ENCOUNTER — Inpatient Hospital Stay (HOSPITAL_COMMUNITY): Payer: 59 | Admitting: Speech Pathology

## 2020-05-14 MED ORDER — METOPROLOL TARTRATE 12.5 MG HALF TABLET
12.5000 mg | ORAL_TABLET | Freq: Two times a day (BID) | ORAL | Status: DC
  Administered 2020-05-14 – 2020-05-19 (×11): 12.5 mg via ORAL
  Filled 2020-05-14 (×10): qty 1

## 2020-05-14 NOTE — Progress Notes (Signed)
Speech Language Pathology Daily Session Note  Patient Details  Name: [name] MRN: [id number] Date of Birth: [birth year]  Today's Date: 05/14/2020 SLP Individual Time: 0700-0740 SLP Individual Time Calculation (min): 40 min  Short Term Goals: Week 2: SLP Short Term Goal 1 (Week 2): Patient will consume current diet of Dys. 3 textures with thin liquids with minimal overt s/s of aspiration and Min A vebral cues for use of swallowing compensatory strategies. SLP Short Term Goal 2 (Week 2): Patient will demonstrate sustained attention to functional tasks for ~10 minutes with Mod A verbal cues for redirection. SLP Short Term Goal 3 (Week 2): Patient will scan to left visual field during functional tasks in 75% of opportunities with Mod A multimodal cues. SLP Short Term Goal 4 (Week 2): Patient will demonstrate functional problem solving for basic and familiar tasks with Mod A verbal cues. SLP Short Term Goal 5 (Week 2): Patient will utilize speech intelligibility strateies at the sentence level to achieve ~90% intelligibility with Min A verbal cues.  Skilled Therapeutic Interventions: Skilled treatment session focused on dysphagia and cognitive goals. SLP facilitated session by providing overall Mod A verbal cues for sustained attention to self-feeding. Patient with decreased attention to task requiring increased cueing with the patient referring to the clinician as "bossy." Patient consumed oatmeal and thin liquids without overt s/s of aspiration. Patient noted to "talk to herself" today in a whisper throughout session with increased emotional responses to conversation like laughing, etc which has not been observed in other sessions. Patient performed oral care and basic grooming tasks with overall Mod A verbal cues for cessation of task. Patient left upright in bed with alarm on and all needs within reach. Continue with current plan of care.      Pain No/Denies Pain   Therapy/Group:  Individual Therapy  Galaxy Borden 05/14/2020, 9:23 AM

## 2020-05-14 NOTE — Progress Notes (Addendum)
[place] PHYSICAL MEDICINE & REHABILITATION PROGRESS NOTE  Subjective/Complaints:  BP 182/42 this AM- says still sleepy- wants to go back to sleep- doesn't want to eat yet, but SLP in room to help with swallowing therapy.   L leg hurting "bad".    ROS:   Pt denies SOB, abd pain, CP, N/V/C/D, and vision changes    Objective: Vital Signs: Blood pressure (!) 184/82, pulse 91, temperature 98.2 F (36.8 C), resp. rate 15, height 5\' 3"  (1.6 m), weight 67.2 kg, last menstrual period 09/15/2013, SpO2 99 %. No results found. No results for input(s): WBC, HGB, HCT, PLT in the last 72 hours. Recent Labs    05/12/20 1858  NA 133*  K 3.9  CL 98  CO2 24  GLUCOSE 101*  BUN 18  CREATININE 1.79*  CALCIUM 9.6    Physical Exam: BP (!) 184/82   Pulse 91   Temp 98.2 F (36.8 C)   Resp 15   Ht 5\' 3"  (1.6 m)   Wt 67.2 kg   LMP 09/15/2013   SpO2 99%   BMI 26.24 kg/m  General: alert, but Ox1-2- which is stable for her- sitting up in bed- repeats herself frequently, NAD; SLP in room HEENT: mouth dry- just woke up Heart: RRR Chest: CTA B/L- no W/R/R- good air movement Abdomen: Soft, NT, ND, (+)BS  Extremities: No clubbing, cyanosis, or edema. Pulses are 2+ Psych: Perseverative, slowed- no change- still perseverates Musc: No edema in extremities.  No tenderness in extremities. Neuro: much more awake Left facial weakness Dysarthria, unchanged Motor: LUE/LE: 0/5 proximal distal MAS of 3-4 noticed in L hip and a little bit less in L knee- no significant since last time I saw her-   Assessment/Plan: 1. 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  Physiatrist is providing close team supervision and 24 hour management of active medical problems listed below.  Physiatrist and rehab team continue to assess barriers to discharge/monitor patient progress toward functional  and medical goals  Care Tool:  Bathing    Body parts bathed by patient: Left arm, Chest, Abdomen, Right upper leg, Left upper leg, Face, Right lower leg, Front perineal area   Body parts bathed by helper: Right arm, Left lower leg, Buttocks     Bathing assist Assist Level: Moderate Assistance - Patient 50 - 74%     Upper Body Dressing/Undressing Upper body dressing   What is the patient wearing?: Pull over shirt    Upper body assist Assist Level: Moderate Assistance - Patient 50 - 74%    Lower Body Dressing/Undressing Lower body dressing      What is the patient wearing?: Incontinence brief, Pants     Lower body assist Assist for lower body dressing: Maximal Assistance - Patient 25 - 49%     Toileting Toileting    Toileting assist Assist for toileting: Total Assistance - Patient < 25%     Transfers Chair/bed transfer  Transfers assist     Chair/bed transfer assist level: 2 Helpers (+2 max assist)     Locomotion Ambulation   Ambulation assist   Ambulation activity did not occur: Safety/medical concerns          Walk 10 feet activity   Assist  Walk 10 feet activity did not occur: Safety/medical concerns        Walk 50 feet activity   Assist Walk 50 feet with 2 turns  activity did not occur: Safety/medical concerns         Walk 150 feet activity   Assist Walk 150 feet activity did not occur: Safety/medical concerns         Walk 10 feet on uneven surface  activity   Assist Walk 10 feet on uneven surfaces activity did not occur: Safety/medical concerns         Wheelchair     Assist Will patient use wheelchair at discharge?: Yes Type of Wheelchair: Manual Wheelchair activity did not occur: Safety/medical concerns         Wheelchair 50 feet with 2 turns activity    Assist    Wheelchair 50 feet with 2 turns activity did not occur: Safety/medical concerns       Wheelchair 150 feet activity     Assist  Wheelchair 150 feet activity did not occur: Safety/medical concerns          Medical Problem List and Plan: 1.Left side hemiparesis, now with spasticity and 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 Antithrombotics: -DVT/anticoagulation:Lovenox -antiplatelet therapy: Aspirin 325 mg daily and Plavix 75 mg daily x3 weeks then aspirin alone 3. Pain Management:Tylenol and Advil as needed for groin and knee pain, voltaren gel for knee pain 4. Mood:Provide emotional support  Amantadine with breakfast and lunch started on 8/25  Per daughter, somnolence is baseline with patient with inconsistent/erratic sleep/wake patterns, often falling asleep if not engaged in activities prior to admission.   Melatonin started 8/29 to reset circadian rhythm.  9/1: Klonopin 0.25mg  daily prn added for anxiety.  -antipsychotic agents: N/A 5. Neuropsych: This patientis ?capable of making decisions on herown behalf. 6. Skin/Wound Care:Routine skin checks. IV d/ced  Prevalon boots ordered 7. Fluids/Electrolytes/Nutrition:Routine in and outs. Discussed with SLP- still having difficulty eating foods. Daughter has been bringing healthy foods at ho,e.  8. Hypertension:   Hydralazine - increased to 100mg  TID on 8/26, Norvasc 10 mg daily.   Imdur 30mg  and Clonidine 0.1mg  BID also added, now with better control.  9/4- BP 184/42 this AM-  Pulse is ~ 90s- will Add Metoprolol 12.5 mg BID  Monitor with increased mobility 9. Hyperlipidemia. Continue Lipitor 10. AKI on CKD. Normal renal function 2017 but no recent visits, does not have a PCP.   Cr. 1.88 on 8/25, trending down on 9/2  9/4- next labs Monday 11. Leukocytosis- U/A (-)  WBCs 11.2 on 8/25  8/30- WBC 10.3  Afebrile 12. 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  14. Spasticity- worse in LLE   8/31: started Dantrolene HS  9/4- pt can totrate up every 7 days- due 9/6- of note, takes awhile for dantrolene to work-gradual onset. .  15. Patient's family concerned about tooth discomfort: difficult for me to assess on exam. Discussed that we will set her up for outpatient dentist follow-up. It is not painful for her but food gets stuck. Discussed soft D3 diet.  15. Hypokalemia: normalized 9/2.     LOS: 11 days A FACE TO FACE EVALUATION WAS PERFORMED  [name] 05/14/2020, 12:52 PM

## 2020-05-14 NOTE — Progress Notes (Signed)
Occupational Therapy Session Note  Patient Details  Name: [name] MRN: [id number] Date of Birth: [birth year]  Today's Date: 05/14/2020 OT Individual Time: 1006-1101 OT Individual Time Calculation (min): 55 min    Short Term Goals: Week 1:  OT Short Term Goal 1 (Week 1): Pt will maintain static sitting balance EOB with mod assist in preparation for transfer or selfcare tasks. OT Short Term Goal 1 - Progress (Week 1): Progressing toward goal OT Short Term Goal 2 (Week 1): Pt will complete UB dressing with mod assist in supported sitting following hemi dressing techniques. OT Short Term Goal 2 - Progress (Week 1): Progressing toward goal OT Short Term Goal 3 (Week 1): Pt will complete rollling in bed to the left with min assist and to the right with max assist in order to assist with selfcare and toileting tasks. OT Short Term Goal 3 - Progress (Week 1): Met OT Short Term Goal 4 (Week 1): Family will return demonstrate safe assist with AAROM/PROM exercises with the LUE following handout. OT Short Term Goal 4 - Progress (Week 1): Progressing toward goal  Skilled Therapeutic Interventions/Progress Updates:    1;1. Pt received in bed with daughter didi present. Pt reporting bladder incontinence in brief. Pt rolls multiple times with MOD A to L with pt able to reach to L rail for L attention 50% of time without cues. Pt with L hip/knee pain and RN applies voltaren gel. Pt able to describe hemi techniques (thread LLE into pants first) from bed level with OT providing total A for LB dressing, but cuing for pt to look down at feet for L visual attention. Pt reporting need to toilet, hwoever despite increased time for void/ bedpan placement, pt unable. Pt completes threads LLE into bra and shirt with MOD A, with fair visual attention to LUE. Pt requires VC for continuation skill as pt often gets distracted by talking in the room and repeats back what is being said. MAX A overall provided for UB  dressing. OT provides gentle PROM to LUE against flexor pattern with min hypertonia in bicep noted. Pt able ot grip OT hand, however minimal shoulder/elbow activation noted during NMR activites seated in bed. Exited session wihtpt seated in bed, exit alarm on and call light in reach  Therapy Documentation Precautions:  Precautions Precautions: Fall, Other (comment) Precaution Comments: L hemi, pushing tendency with RUE to weak L side Restrictions Weight Bearing Restrictions: No General:   Vital Signs:  Pain:   ADL: ADL Eating: Moderate assistance Where Assessed-Eating: Wheelchair Grooming: Moderate assistance Where Assessed-Grooming: Wheelchair Upper Body Bathing: Moderate assistance Where Assessed-Upper Body Bathing: Wheelchair Lower Body Bathing: Moderate assistance Where Assessed-Lower Body Bathing: Bed level Upper Body Dressing: Maximal assistance Where Assessed-Upper Body Dressing: Wheelchair Lower Body Dressing: Maximal assistance Where Assessed-Lower Body Dressing: Bed level Toileting: Dependent Where Assessed-Toileting: Bed level Toilet Transfer: Dependent Toilet Transfer Method: Not assessed Toilet Transfer Equipment: Bedside commode Vision   Perception    Praxis   Exercises:   Other Treatments:     Therapy/Group: Individual Therapy  [name] 05/14/2020, 10:31 AM

## 2020-05-14 NOTE — Plan of Care (Signed)
  Problem: Consults Goal: RH STROKE PATIENT EDUCATION Description: See Patient Education module for education specifics  Outcome: Progressing   Problem: RH BOWEL ELIMINATION Goal: RH STG MANAGE BOWEL WITH ASSISTANCE Description: STG Manage Bowel with Mod I Assistance. Outcome: Progressing Goal: RH STG MANAGE BOWEL W/MEDICATION W/ASSISTANCE Description: STG Manage Bowel with Medication with Mod I Assistance. Outcome: Progressing

## 2020-05-15 MED ORDER — DOCUSATE SODIUM 283 MG RE ENEM
1.0000 | ENEMA | Freq: Every day | RECTAL | Status: DC | PRN
  Administered 2020-05-15 – 2020-05-16 (×2): 283 mg via RECTAL
  Filled 2020-05-15 (×3): qty 1
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
## 2020-05-15 NOTE — Progress Notes (Signed)
[facility] PHYSICAL MEDICINE & REHABILITATION PROGRESS NOTE  Subjective/Complaints:   Pt reports she's sleepy- just woke up but wants to go back to sleep- no issues- repeats everything 3x.    ROS:  Limited due to sedation this AM   Objective: Vital Signs: Blood pressure (!) 171/82, pulse 89, temperature 98.6 F (37 C), resp. rate 16, height 5\' 3"  (1.6 m), weight 67.2 kg, last menstrual period 09/15/2013, SpO2 100 %. No results found. No results for input(s): WBC, HGB, HCT, PLT in the last 72 hours. Recent Labs    05/12/20 1858  NA 133*  K 3.9  CL 98  CO2 24  GLUCOSE 101*  BUN 18  CREATININE 1.79*  CALCIUM 9.6    Physical Exam: BP (!) 171/82 (BP Location: Right Arm)   Pulse 89   Temp 98.6 F (37 C)   Resp 16   Ht 5\' 3"  (1.6 m)   Wt 67.2 kg   LMP 09/15/2013   SpO2 100%   BMI 26.24 kg/m  General: alert, but Ox1-2- stable- just woke up- stretching- repeats self 3x for everything, NAD HEENT: mouth still a little dry Heart: RRR Chest: CTA B/L- no W/R/R- good air movement Abdomen:Soft, NT, ND, (+)BS  Extremities: No clubbing, cyanosis, or edema. Pulses are 2+ Psych: Perseverative, slowed- no change- still perseverates- repeats everything 3x Musc: No edema in extremities.  No tenderness in extremities. Neuro: much more awake Left facial weakness Dysarthria, unchanged Motor: LUE/LE: 0/5 proximal distal MAS of 3-4 noticed in L hip and a little bit less in L knee- no significant since last time I saw her-   Assessment/Plan: 1. 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  Physiatrist is providing close team supervision and 24 hour management of active medical problems listed below.  Physiatrist and rehab team continue to assess barriers to discharge/monitor patient progress toward functional and medical goals  Care Tool:  Bathing    Body parts  bathed by patient: Left arm, Chest, Abdomen, Right upper leg, Left upper leg, Face, Right lower leg, Front perineal area   Body parts bathed by helper: Right arm, Left lower leg, Buttocks     Bathing assist Assist Level: Moderate Assistance - Patient 50 - 74%     Upper Body Dressing/Undressing Upper body dressing   What is the patient wearing?: Pull over shirt    Upper body assist Assist Level: Moderate Assistance - Patient 50 - 74%    Lower Body Dressing/Undressing Lower body dressing      What is the patient wearing?: Incontinence brief, Pants     Lower body assist Assist for lower body dressing: Maximal Assistance - Patient 25 - 49%     Toileting Toileting    Toileting assist Assist for toileting: Total Assistance - Patient < 25%     Transfers Chair/bed transfer  Transfers assist     Chair/bed transfer assist level: 2 Helpers (+2 max assist)     Locomotion Ambulation   Ambulation assist   Ambulation activity did not occur: Safety/medical concerns          Walk 10 feet activity   Assist  Walk 10 feet activity did not occur: Safety/medical concerns        Walk 50 feet activity   Assist Walk 50 feet with 2 turns activity did not occur: Safety/medical concerns         Walk 150 feet activity  Assist Walk 150 feet activity did not occur: Safety/medical concerns         Walk 10 feet on uneven surface  activity   Assist Walk 10 feet on uneven surfaces activity did not occur: Safety/medical concerns         Wheelchair     Assist Will patient use wheelchair at discharge?: Yes Type of Wheelchair: Manual Wheelchair activity did not occur: Safety/medical concerns         Wheelchair 50 feet with 2 turns activity    Assist    Wheelchair 50 feet with 2 turns activity did not occur: Safety/medical concerns       Wheelchair 150 feet activity     Assist Wheelchair 150 feet activity did not occur: Safety/medical  concerns          Medical Problem List and Plan: 1.Left side hemiparesis, now with spasticity and 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 Antithrombotics: -DVT/anticoagulation:Lovenox -antiplatelet therapy: Aspirin 325 mg daily and Plavix 75 mg daily x3 weeks then aspirin alone 3. Pain Management:Tylenol and Advil as needed for groin and knee pain, voltaren gel for knee pain 4. Mood:Provide emotional support  Amantadine with breakfast and lunch started on 8/25  Per daughter, somnolence is baseline with patient with inconsistent/erratic sleep/wake patterns, often falling asleep if not engaged in activities prior to admission.   Melatonin started 8/29 to reset circadian rhythm.  9/1: Klonopin 0.25mg  daily prn added for anxiety.  -antipsychotic agents: N/A 5. Neuropsych: This patientis ?capable of making decisions on herown behalf. 6. Skin/Wound Care:Routine skin checks. IV d/ced  Prevalon boots ordered 7. Fluids/Electrolytes/Nutrition:Routine in and outs. Discussed with SLP- still having difficulty eating foods. Daughter has been bringing healthy foods at ho,e.  8. Hypertension:   Hydralazine - increased to 100mg  TID on 8/26, Norvasc 10 mg daily.   Imdur 30mg  and Clonidine 0.1mg  BID also added, now with better control.  9/4- BP 184/42 this AM-  Pulse is ~ 90s- will Add Metoprolol 12.5 mg BID  9/5- BP slightly beeter 171.82- will give Metorpolol a little time to work.   Monitor with increased mobility 9. Hyperlipidemia. Continue Lipitor 10. AKI on CKD. Normal renal function 2017 but no recent visits, does not have a PCP.   Cr. 1.88 on 8/25, trending down on 9/2  9/4- next labs Monday 11. Leukocytosis- U/A (-)  WBCs 11.2 on 8/25  8/30- WBC 10.3  Afebrile 12. 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  14. Spasticity- worse in LLE   8/31: started Dantrolene HS  9/4- pt can totrate up every 7 days- due 9/6- of note, takes awhile for dantrolene to work-gradual onset. .  15. Patient's family concerned about tooth discomfort: difficult for me to assess on exam. Discussed that we will set her up for outpatient dentist follow-up. It is not painful for her but food gets stuck. Discussed soft D3 diet.  15. Hypokalemia: normalized 9/2.     LOS: 12 days A FACE TO FACE EVALUATION WAS PERFORMED  Jameson Tormey 05/15/2020, 11:17 AM

## 2020-05-16 ENCOUNTER — Inpatient Hospital Stay (HOSPITAL_COMMUNITY): Payer: 59

## 2020-05-16 ENCOUNTER — Inpatient Hospital Stay (HOSPITAL_COMMUNITY): Payer: 59 | Admitting: Occupational Therapy

## 2020-05-16 LAB — CBC WITH DIFFERENTIAL/PLATELET
Abs Immature Granulocytes: 0.02 10*3/uL (ref 0.00–0.07)
Basophils Absolute: 0.1 10*3/uL (ref 0.0–0.1)
Basophils Relative: 1 %
Eosinophils Absolute: 0.2 10*3/uL (ref 0.0–0.5)
Eosinophils Relative: 2 %
HCT: 31.5 % — ABNORMAL LOW (ref 36.0–46.0)
Hemoglobin: 9.9 g/dL — ABNORMAL LOW (ref 12.0–15.0)
Immature Granulocytes: 0 %
Lymphocytes Relative: 13 %
Lymphs Abs: 1.3 10*3/uL (ref 0.7–4.0)
MCH: 27.8 pg (ref 26.0–34.0)
MCHC: 31.4 g/dL (ref 30.0–36.0)
MCV: 88.5 fL (ref 80.0–100.0)
Monocytes Absolute: 1.1 10*3/uL — ABNORMAL HIGH (ref 0.1–1.0)
Monocytes Relative: 12 %
Neutro Abs: 6.8 10*3/uL (ref 1.7–7.7)
Neutrophils Relative %: 72 %
Platelets: 181 10*3/uL (ref 150–400)
RBC: 3.56 MIL/uL — ABNORMAL LOW (ref 3.87–5.11)
RDW: 13.9 % (ref 11.5–15.5)
WBC: 9.4 10*3/uL (ref 4.0–10.5)
nRBC: 0 % (ref 0.0–0.2)

## 2020-05-16 LAB — BASIC METABOLIC PANEL
Anion gap: 10 (ref 5–15)
BUN: 13 mg/dL (ref 6–20)
CO2: 25 mmol/L (ref 22–32)
Calcium: 9.3 mg/dL (ref 8.9–10.3)
Chloride: 102 mmol/L (ref 98–111)
Creatinine, Ser: 1.57 mg/dL — ABNORMAL HIGH (ref 0.44–1.00)
GFR calc Af Amer: 42 mL/min — ABNORMAL LOW (ref >60)
GFR calc non Af Amer: 36 mL/min — ABNORMAL LOW (ref >60)
Glucose, Bld: 103 mg/dL — ABNORMAL HIGH (ref 70–99)
Potassium: 3.6 mmol/L (ref 3.5–5.1)
Sodium: 137 mmol/L (ref 135–145)

## 2020-05-16 MED ORDER — DANTROLENE SODIUM 100 MG PO CAPS
100.0000 mg | ORAL_CAPSULE | Freq: Every day | ORAL | Status: DC
  Administered 2020-05-16 – 2020-05-23 (×8): 100 mg via ORAL
  Filled 2020-05-16 (×8): qty 1

## 2020-05-16 NOTE — Progress Notes (Signed)
Speech Language Pathology Daily Session Note  Patient Details  Name: [name] MRN: [id number] Date of Birth: Sep 16, 1960  Today's Date: 05/16/2020 SLP Individual Time: 0830-0930 SLP Individual Time Calculation (min): 60 min  Short Term Goals: Week 2: SLP Short Term Goal 1 (Week 2): Patient will consume current diet of Dys. 3 textures with thin liquids with minimal overt s/s of aspiration and Min A vebral cues for use of swallowing compensatory strategies. SLP Short Term Goal 2 (Week 2): Patient will demonstrate sustained attention to functional tasks for ~10 minutes with Mod A verbal cues for redirection. SLP Short Term Goal 3 (Week 2): Patient will scan to left visual field during functional tasks in 75% of opportunities with Mod A multimodal cues. SLP Short Term Goal 4 (Week 2): Patient will demonstrate functional problem solving for basic and familiar tasks with Mod A verbal cues. SLP Short Term Goal 5 (Week 2): Patient will utilize speech intelligibility strateies at the sentence level to achieve ~90% intelligibility with Min A verbal cues.  Skilled Therapeutic Interventions: Skilled SLP tx focused on dysphagia and cognition. Pt consumed dys 3 snack this session.  Demonstrated decreased initiation initially, and was fed by slp for first bite.Extended time needed for mastication. Pt required sips of thin liquid to clear oral cavity after each bite. Voice was clear after all trials and no overt s/sx of aspiration or penetration noted. SLP facilitated visual scanning task with cards (1-12) scattered on table. Mod-max A with verbal and visual cues needed to locate cards in right and left visual field. Pt often touched the correct card in left visual field but did not select card and continued to search for the requested card. Pt scanned and read today's schedule written in large print with Max A for attention and for initiation. Pt left seated upright in bed with bed alarm set and call bell  within reach. Cont therapy per plan of care.     Pain Pain Assessment Pain Scale: Faces Faces Pain Scale: No hurt  Therapy/Group: Individual Therapy  Darrol Poke Tvisha Schwoerer 05/16/2020, 12:42 PM

## 2020-05-16 NOTE — Progress Notes (Signed)
[facility] [place] PHYSICAL MEDICINE & REHABILITATION PROGRESS NOTE  Subjective/Complaints: Continues to complain of some left groin pain. The Advil helps. She does not need any right now. Has been sleeping better at night Still with significant left sided tone   ROS:  +left groin pain, improved sleep   Objective: Vital Signs: Blood pressure 133/73, pulse 73, temperature 98.8 F (37.1 C), resp. rate 16, height 5\' 3"  (1.6 m), weight 67.2 kg, last menstrual period 09/15/2013, SpO2 98 %. No results found. Recent Labs    05/16/20 0720  WBC 9.4  HGB 9.9*  HCT 31.5*  PLT 181   Recent Labs    05/16/20 0720  NA 137  K 3.6  CL 102  CO2 25  GLUCOSE 103*  BUN 13  CREATININE 1.57*  CALCIUM 9.3    Physical Exam: BP 133/73 (BP Location: Right Arm)   Pulse 73   Temp 98.8 F (37.1 C)   Resp 16   Ht 5\' 3"  (1.6 m)   Wt 67.2 kg   LMP 09/15/2013   SpO2 98%   BMI 26.24 kg/m  General: Alert and oriented x 2, No apparent distress HEENT: Head is normocephalic, poor dentition Neck: Supple without JVD or lymphadenopathy Heart: Reg rate and rhythm. No murmurs rubs or gallops Chest: CTA bilaterally without wheezes, rales, or rhonchi; no distress Abdomen: Soft, non-tender, non-distended, bowel sounds positive. Extremities: No clubbing, cyanosis, or edema. Pulses are 2+ Skin: Clean and intact without signs of breakdown Psych: Perseverative, slowed- no change- still perseverates- repeats everything 3x Musc: No edema in extremities.  No tenderness in extremities. Neuro: much more awake Left facial weakness Dysarthria, unchanged Motor: LUE/LE: 0/5 proximal distal MAS of 3-4 noticed in L hip and a little bit less in L knee   Assessment/Plan: 1. 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  Physiatrist is providing close team supervision and 24 hour management of active medical  problems listed below.  Physiatrist and rehab team continue to assess barriers to discharge/monitor patient progress toward functional and medical goals  Care Tool:  Bathing    Body parts bathed by patient: Left arm, Chest, Abdomen, Right upper leg, Left upper leg, Face, Right lower leg, Front perineal area   Body parts bathed by helper: Right arm, Left lower leg, Buttocks     Bathing assist Assist Level: Moderate Assistance - Patient 50 - 74%     Upper Body Dressing/Undressing Upper body dressing   What is the patient wearing?: Pull over shirt    Upper body assist Assist Level: Moderate Assistance - Patient 50 - 74%    Lower Body Dressing/Undressing Lower body dressing      What is the patient wearing?: Incontinence brief, Pants     Lower body assist Assist for lower body dressing: Maximal Assistance - Patient 25 - 49%     Toileting Toileting    Toileting assist Assist for toileting: Total Assistance - Patient < 25%     Transfers Chair/bed transfer  Transfers assist     Chair/bed transfer assist level: 2 Helpers (+2 max assist)     Locomotion Ambulation   Ambulation assist   Ambulation activity did not occur: Safety/medical concerns          Walk 10 feet activity   Assist  Walk 10 feet activity did not occur: Safety/medical concerns        Walk 50 feet activity  Assist Walk 50 feet with 2 turns activity did not occur: Safety/medical concerns         Walk 150 feet activity   Assist Walk 150 feet activity did not occur: Safety/medical concerns         Walk 10 feet on uneven surface  activity   Assist Walk 10 feet on uneven surfaces activity did not occur: Safety/medical concerns         Wheelchair     Assist Will patient use wheelchair at discharge?: Yes Type of Wheelchair: Manual Wheelchair activity did not occur: Safety/medical concerns         Wheelchair 50 feet with 2 turns activity    Assist     Wheelchair 50 feet with 2 turns activity did not occur: Safety/medical concerns       Wheelchair 150 feet activity     Assist Wheelchair 150 feet activity did not occur: Safety/medical concerns          Medical Problem List and Plan: 1.Left side hemiparesis, now with spasticity and 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 Antithrombotics: -DVT/anticoagulation:Lovenox -antiplatelet therapy: Aspirin 325 mg daily and Plavix 75 mg daily x3 weeks then aspirin alone 3. Pain Management:Tylenol and Advil as needed for groin and knee pain, voltaren gel for knee pain 4. Mood:Provide emotional support  Amantadine with breakfast and lunch started on 8/25  Per daughter, somnolence is baseline with patient with inconsistent/erratic sleep/wake patterns, often falling asleep if not engaged in activities prior to admission.   Melatonin started 8/29 to reset circadian rhythm.  9/1: Klonopin 0.25mg  daily prn added for anxiety.  -antipsychotic agents: N/A 5. Neuropsych: This patientis ?capable of making decisions on herown behalf. 6. Skin/Wound Care:Routine skin checks. IV d/ced  Prevalon boots ordered 7. Fluids/Electrolytes/Nutrition:Routine in and outs. Discussed with SLP- still having difficulty eating foods. Daughter has been bringing healthy foods at ho,e.  8. Hypertension:   Hydralazine - increased to 100mg  TID on 8/26, Norvasc 10 mg daily.   Imdur 30mg  and Clonidine 0.1mg  BID also added, now with better control.  9/4- BP 184/42 this AM-  Pulse is ~ 90s- will Add Metoprolol 12.5 mg BID  9/5- BP slightly beeter 171.82- will give Metorpolol a little time to work.   Monitor with increased mobility 9. Hyperlipidemia. Continue Lipitor 10. AKI on CKD. Normal renal function 2017 but no recent visits, does not have a PCP.   Cr. 1.88 on 8/25, trending down on 9/2, down to 1.57 on 9/6 11. Leukocytosis- U/A (-)  Stable 9/6 12.  Thrombocytopenia  stabale 9/6 13.  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 Doing well with D3 foods.  14. Spasticity- worse in LLE   9/6: increase Dantrolene to 100mg  at night  15. Patient's family concerned about tooth discomfort: difficult for me to assess on exam. Discussed that we will set her up for outpatient dentist follow-up. It is not painful for her but food gets stuck. Discussed soft D3 diet.  15. Hypokalemia: normalized     LOS: 13 days A FACE TO FACE EVALUATION WAS PERFORMED  Clide Deutscher Inri Sobieski 05/16/2020, 8:53 AM

## 2020-05-16 NOTE — Progress Notes (Signed)
Orthopedic Tech Progress Note Patient Details:  [name] 1961-03-25 [id number] Called in order to [employer] for a RESTING HAND SPLINT. Its a HOLIDAY so patient will be service tomorrow.  Patient ID: [name], female   DOB: 1961/02/12, 59 y.o.   MRN: [id number]   [name] 05/16/2020, 12:37 PM
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
## 2020-05-16 NOTE — Progress Notes (Signed)
Occupational Therapy Session Note  Patient Details  Name: [name] MRN: [id number] Date of Birth: 07-26-1961  Today's Date: 05/16/2020 OT Individual Time: 1000-1035 ; 1050-1103 OT Individual Time Calculation (min): 35 min ; 13 min   Short Term Goals: Week 2:  OT Short Term Goal 1 (Week 2): Pt will be able to maintain static sit at EOB with mod A to prep for slide transfer. OT Short Term Goal 2 (Week 2): Pt will be able to self correct posture with mod cues and min A when she leans too far to the L. OT Short Term Goal 3 (Week 2): Pt will don shirt with mod A. OT Short Term Goal 4 (Week 2): pt will complete slide board transfer with max A of 1 to increase potential for toilet transfers.  Skilled Therapeutic Interventions/Progress Updates:    Pt supine in bed, c/o pain left groin "It hurts so bad".  Pt repositioned intermittently to reduce pain.  Nurse requesting pt be transferred to [facility] after enema administered to assist with bowel movement.  Pt needing +2 for bed level toileting due to presence of soiled brief (of urine) and enema administration by nurse and rolling left and right.  Pt needing +2 sidelying to sit EOB and max assist to right to center due to left posterior leaning.  Total assist +2 sliding board transfer EOB to BSC using chuck pad to protect skin.  Max assist needed to ensure upright posture while sitting on commode.  Pt reports she cant feel any signs of bowel movement while on commode.  Pt returned to bed level needing total assist +2 for sliding board ( using chuck pad) BSC to EOB and sit to supine. Pt needing total assist +2 to place bed pan to allow for more time for bowel movement. OT noted small stool ball present in commode container, nurse made aware.  Call bell in reach, bed alarm on.  OT returned after 15 minutes to complete toileting.  Pt reports someone had already removed bedpan, however upon OT checking, noted bedpan still in place.  Pt needing total assist  to roll left and right with max VCs and TCs to facilitate RUE use at bedrail.  Pt left groin pain with movement.  Bedpan removed, no bowel or bladder occurrence to note.  Clothing mgt and pericare completed with total assist. Warmpack applied to left groin to reduce pain and pt reports some relief with this measure.  Skin intact prior and after application.  Call bell in reach, bed alarm on.    Therapy Documentation Precautions:  Precautions Precautions: Fall, Other (comment) Precaution Comments: L hemi, pushing tendency with RUE to weak L side Restrictions Weight Bearing Restrictions: No   Therapy/Group: Individual Therapy  Ezekiel Slocumb 05/16/2020, 10:41 AM

## 2020-05-16 NOTE — Progress Notes (Signed)
Physical Therapy Session Note  Patient Details  Name: [name] MRN: [id number] Date of Birth: 04/22/1961  Today's Date: 05/16/2020 PT Individual Time: 1300-1410 PT Individual Time Calculation (min): 70 min   Short Term Goals: Week 2:  PT Short Term Goal 1 (Week 2): Pt will perform bed mobility with modA PT Short Term Goal 2 (Week 2): Pt will maintain unsupported sitting EOB with modA PT Short Term Goal 3 (Week 2): Pt will perform transfers with LRAD and modA  Skilled Therapeutic Interventions/Progress Updates:    Pt received supine in bed, NT present at bedside assisting with dressing. Pt agreeable to PT session. Pt recognizes therapist and therapist's name without reminders. Supine<>sit with maxA with HOB flat, assist for BLE and trunk management to upright via log rolling technique. Requiring maxA for initial sitting 2/2 L lateral and posterior lean while unsupported. When cued for RUE to grasp bedrail, she was able to maintain sitting with CGA. Performed sliding board transfer with mod/maxA from EOB to TIS w/c, towards pt's R side. Transported from room to main therapy gym with totalA for time management and transfer with sliding board and maxA from TIS w/c to mat table, towards her weaker L side. Once sitting at mat table, used mirror for visual feedback and she was able to maintain unsupported sitting for ~2-3 minutes with CGA/close supervision with verbal cueing for forward gaze to mirror and upright posture. However, after this 2-3 minutes, noted onset of fatigue and lethargy. Encouraged RUE target reaching laterally to reduce L lateral lean which improved balance with short brief periods. Pt aware that she would "fall" or lean L while sitting but has great difficulty initiating the correction despite max multimodal cueing. Due to fatigue impacting sitting balance, used large wedge for posterior propping to allow her to rest. After seated rest break, performed targeted reaching with  RUE from across midline to her R side, using the number/color pads for cognitive training and efforts to maintain alertness. Lethargy continued to worsen and pt with minimal efforts for participating, thus we transferred back to her TIS w/c with maxA and sliding board, totalA transported back to her room in the w/c, and maxA sliding board transfer back to bed from her w/c. Sit>supine with maxA for BLE management via log rolling technique and maxA +2 for supine scooting for repositioning. Pt c/o of L hip pain (throughout session) so therapist performed PROM for L hip in multi planes focusing on hip abduction/flexion/external rotation, pt reports mild pain improvement afterwards. Pt ended session sleeping supine in bed, LUE propped with pillow, 3/4 bed rails up, bed alarm on, needs in reach.  Therapy Documentation Precautions:  Precautions Precautions: Fall, Other (comment) Precaution Comments: L hemi, pushing tendency with RUE to weak L side Restrictions Weight Bearing Restrictions: No Vital Signs: Therapy Vitals Temp: 98.5 F (36.9 C) Temp Source: Oral Pulse Rate: 70 Resp: 16 BP: 122/78 Patient Position (if appropriate): Lying Oxygen Therapy SpO2: 100 % O2 Device: Room Air FiO2 (%): 21 % Pain: Pain Assessment Pain Scale: Faces Faces Pain Scale: No hurt  Therapy/Group: Individual Therapy  Jamarie Mussa P Viggo Perko PT 05/16/2020, 3:36 PM

## 2020-05-17 ENCOUNTER — Inpatient Hospital Stay (HOSPITAL_COMMUNITY): Payer: 59

## 2020-05-17 ENCOUNTER — Inpatient Hospital Stay (HOSPITAL_COMMUNITY): Payer: 59 | Admitting: Occupational Therapy

## 2020-05-17 ENCOUNTER — Inpatient Hospital Stay (HOSPITAL_COMMUNITY): Payer: 59 | Admitting: Speech Pathology

## 2020-05-17 DIAGNOSIS — G8110 Spastic hemiplegia affecting unspecified side: Secondary | ICD-10-CM

## 2020-05-17 NOTE — Progress Notes (Signed)
Orthopedic Tech Progress Note Patient Details:  MADALEINE SIMMON [birth year] [id number] Called in order to [employer] for a PRAFO BOOT. And a reminder of the [place]. Patient ID: ALEYAH BALIK, female   DOB: 04/06/1961, 59 y.o.   MRN: [id number]   [name] 05/17/2020, 8:49 AM

## 2020-05-17 NOTE — Progress Notes (Signed)
Physical Therapy Session Note  Patient Details  Name: [name] MRN: [id number] Date of Birth: [birth year]  Today's Date: 05/17/2020 PT Individual Time: 8938-1017 PT Individual Time Calculation (min): 54 min   Short Term Goals: Week 1:  PT Short Term Goal 1 (Week 1): Pt will performed supine<>sit with modA PT Short Term Goal 2 (Week 1): Pt will maintain sitting EOB >46min with modA PT Short Term Goal 3 (Week 1): Pt will perform bed<>chair transfers with modA and LRAD PT Short Term Goal 4 (Week 1): Pt will tolerate sitting in w/c for > 2 hours to demonstrate improved activity tolerance  Skilled Therapeutic Interventions/Progress Updates:    Pt received sitting in w/c with full lap tray, awake and agreeable to PT session. Pt reports unrated L hip flexor pain, provided rest breaks and mobility for conservative pain management. Pt transported with totalA in [place] w/c from room to small therapy gym and placed in front of standing frame. Performed x3 sit<>stands in standing frame while instructing patient to facilitate upright standing and forward weight shift. She tolerated ~10 minutes of standing each trial with mirror for visual feedback. Performed target reaching with RUE while standing in frame focusing on L attention and R lateral reaching to reduce L lateral truncal lean. After 3rd stand, she endorsed urgency for BM. Therefore, transported back to her room in w/c and perform sliding board transfer with maxA +2 to [facility]. Provided platform under BLE's for comfort and pillow wedge to promote upright posture while sitting on BSC. Pt reporting she had received prune juice and enema from nursing staff earlier. Pt succesfull in producing BM and pt then performed max A+2 stand<>pivot transfer from [facility] to EOB. Sit<>supine with max/totalA. NT called to assist with pericare and pt ended session supine with NT present.   Therapy Documentation Precautions:  Precautions Precautions: Fall, Other  (comment) Precaution Comments: L hemi, pushing tendency with RUE to weak L side Restrictions Weight Bearing Restrictions: No  Pain: Pain Assessment Pain Score: 0-No pain  Therapy/Group: Individual Therapy  Mason Burleigh P Markice Torbert PT 05/17/2020, 1:54 PM

## 2020-05-17 NOTE — Progress Notes (Signed)
Speech Language Pathology Daily Session Note  Patient Details  Name: [name] MRN: [id number] Date of Birth: Dec 18, 1960  Today's Date: 05/17/2020 SLP Individual Time: 1400-1410 SLP Individual Time Calculation (min): 10 min and Today's Date: 05/17/2020 SLP Missed Time: 20 Minutes Missed Time Reason: Patient fatigue  Short Term Goals: Week 2: SLP Short Term Goal 1 (Week 2): Patient will consume current diet of Dys. 3 textures with thin liquids with minimal overt s/s of aspiration and Min A vebral cues for use of swallowing compensatory strategies. SLP Short Term Goal 2 (Week 2): Patient will demonstrate sustained attention to functional tasks for ~10 minutes with Mod A verbal cues for redirection. SLP Short Term Goal 3 (Week 2): Patient will scan to left visual field during functional tasks in 75% of opportunities with Mod A multimodal cues. SLP Short Term Goal 4 (Week 2): Patient will demonstrate functional problem solving for basic and familiar tasks with Mod A verbal cues. SLP Short Term Goal 5 (Week 2): Patient will utilize speech intelligibility strateies at the sentence level to achieve ~90% intelligibility with Min A verbal cues.  Skilled Therapeutic Interventions: Skilled treatment session focused on cognitive goals. Upon arrival, patient was supine in bed and appeared asleep. Patient easily awakened and reported she was very uncomfortable because of pain in her groin. SLP attempted to reposition patient per her request but patient was on the bedpan (which she did not report until questioned). Patient declined to get off of the bedpan and reported she was constipated but had just had a large bowel movement per the NT on the [facility] with therapy. Patient eventually agreeable to be removed from the bedpan but then reported, I am not trying to stay awake anymore, I am tired. Despite encouragement, patient closed her eyes and attempted to fall asleep. Therefore, patient missed remaining 20  minutes of session. Patient left supine in bed with alarm on and all needs within reach. Continue with current plan of care.      Pain Pain Assessment Pain Score: 0-No pain  Therapy/Group: Individual Therapy  Paisli Silfies 05/17/2020, 2:30 PM

## 2020-05-17 NOTE — Plan of Care (Signed)
  Problem: Consults Goal: RH STROKE PATIENT EDUCATION Description: See Patient Education module for education specifics  Outcome: Progressing Goal: Nutrition Consult-if indicated Outcome: Progressing Goal: Diabetes Guidelines if Diabetic/Glucose > 140 Description: If diabetic or lab glucose is > 140 mg/dl - Initiate Diabetes/Hyperglycemia Guidelines & Document Interventions  Outcome: Progressing   Problem: RH BOWEL ELIMINATION Goal: RH STG MANAGE BOWEL WITH ASSISTANCE Description: STG Manage Bowel with Mod I Assistance. Outcome: Progressing Goal: RH STG MANAGE BOWEL W/MEDICATION W/ASSISTANCE Description: STG Manage Bowel with Medication with Mod I Assistance. Outcome: Progressing   Problem: RH BLADDER ELIMINATION Goal: RH STG MANAGE BLADDER WITH ASSISTANCE Description: STG Manage Bladder With Mod I Assistance Outcome: Progressing   Problem: RH SKIN INTEGRITY Goal: RH STG SKIN FREE OF INFECTION/BREAKDOWN Description: Mod I Outcome: Progressing   Problem: RH SAFETY Goal: RH STG ADHERE TO SAFETY PRECAUTIONS W/ASSISTANCE/DEVICE Description: STG Adhere to Safety Precautions With Mod I Assistance/Device. Outcome: Progressing   Problem: RH PAIN MANAGEMENT Goal: RH STG PAIN MANAGED AT OR BELOW PT'S PAIN GOAL Description: <2 Outcome: Progressing   Problem: RH KNOWLEDGE DEFICIT Goal: RH STG INCREASE KNOWLEDGE OF HYPERTENSION Description: Patient will be able to manage HTN with medications and dietary restrictions using handouts and educational information independently Outcome: Progressing Goal: RH STG INCREASE KNOWLEDGE OF STROKE PROPHYLAXIS Description: Patient will acknowledge understanding of secondary stroke risks and management with stroke prophylaxis medications using handouts and educational materials independently Outcome: Progressing

## 2020-05-17 NOTE — Progress Notes (Signed)
Occupational Therapy Session Note  Patient Details  Name: [name] MRN: [id number] Date of Birth: 03-Mar-1961  Today's Date: 05/17/2020 OT Individual Time: 1035-1140 OT Individual Time Calculation (min): 65 min    Short Term Goals: Week 2:  OT Short Term Goal 1 (Week 2): Pt will be able to maintain static sit at EOB with mod A to prep for slide transfer. OT Short Term Goal 2 (Week 2): Pt will be able to self correct posture with mod cues and min A when she leans too far to the L. OT Short Term Goal 3 (Week 2): Pt will don shirt with mod A. OT Short Term Goal 4 (Week 2): pt will complete slide board transfer with max A of 1 to increase potential for toilet transfers.  Skilled Therapeutic Interventions/Progress Updates:      Pt seen for BADL retraining of toileting, bathing, and dressing with a focus on transfers, balance, attention, motor planning.   Used a slide board with a pad under pt to complete Bed to drop arm BSC transfer. Pt was in a soaking wet brief and once on BSC unable to void further and stated she was constipated.  Pt able to sit on BSC with S only and no leaning.  On  BSC, pt engaged in self care (see details below). To pull pants up pt needed to rise to stand.  Attempted to use a stedy, needed max A of 2 to rise to stand BUT once up in stedy it became very unsafe as pt leaning to Left.  Pt worried about painful knees and pushing away from pads of stedy. +3 to safely transfer her to w.c.   Once in chair, posture upright.  Set up full lap tray for support of arm.  Her dtr, [name] was present and actively involved in assisting with stedy transfer.  Educated her on use of hand splint and prafo for night.   Pt resting in TIS w/c with all needs met.  Therapy Documentation Precautions:  Precautions Precautions: Fall, Other (comment) Precaution Comments: L hemi, pushing tendency with RUE to weak L side Restrictions Weight Bearing Restrictions: No      Pain: Pain Assessment Pain Score: 0-No pain ADL: ADL Eating: Moderate assistance Where Assessed-Eating: Wheelchair Grooming: Moderate assistance Where Assessed-Grooming: Wheelchair Upper Body Bathing: Moderate assistance Where Assessed-Upper Body Bathing: Wheelchair Lower Body Bathing: Maximal assistance Where Assessed-Lower Body Bathing: Chair Upper Body Dressing: Moderate assistance Where Assessed-Upper Body Dressing: Wheelchair Lower Body Dressing: Dependent Where Assessed-Lower Body Dressing: Chair Toileting: Dependent Where Assessed-Toileting: Glass blower/designer: Dependent (2  helpers, Warehouse manager) Armed forces technical officer Method: Other (comment) (slide board) Toilet Transfer Equipment: Drop arm bedside commode   Therapy/Group: Individual Therapy  [place] 05/17/2020, 1:02 PM

## 2020-05-17 NOTE — Progress Notes (Signed)
[facility] PHYSICAL MEDICINE & REHABILITATION PROGRESS NOTE  Subjective/Complaints: Patient seen laying in bed this morning.  She states she slept well overnight.  She notes her left arm pain, which improved with warm compress.  She notes some constipation as well.  ROS: + Left groin pain, constipation. Denies CP, SOB, N/V/D  Objective: Vital Signs: Blood pressure (!) 157/80, pulse 82, temperature 98 F (36.7 C), resp. rate 18, height 5\' 3"  (1.6 m), weight 67.2 kg, last menstrual period 09/15/2013, SpO2 99 %. No results found. Recent Labs    05/16/20 0720  WBC 9.4  HGB 9.9*  HCT 31.5*  PLT 181   Recent Labs    05/16/20 0720  NA 137  K 3.6  CL 102  CO2 25  GLUCOSE 103*  BUN 13  CREATININE 1.57*  CALCIUM 9.3    Physical Exam: BP (!) 157/80 (BP Location: Right Arm)   Pulse 82   Temp 98 F (36.7 C)   Resp 18   Ht 5\' 3"  (1.6 m)   Wt 67.2 kg   LMP 09/15/2013   SpO2 99%   BMI 26.24 kg/m  Constitutional: No distress . Vital signs reviewed. HENT: Normocephalic.  Atraumatic. Eyes: EOMI. No discharge. Cardiovascular: No JVD.  RRR. Respiratory: Normal effort.  No stridor.  Bilateral clear to auscultation. GI: Non-distended.  BS +. Skin: Warm and dry.  Intact. Psych: Perseverative speech. Musc: No edema in extremities.  No tenderness in extremities.  Mild TTP left groin. Neuro: Alert Left facial weakness Dysarthria, stable Motor: LUE: Proximally 0/5, handgrip 2+/5 with apraxia LLE: 0/5 trace movement RLE: Hip flexion, knee extension trace, ankle dorsiflexion 3+/5 Increased tone noted LUE/LLE   Assessment/Plan: 1. 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  Physiatrist is providing close team supervision and 24 hour management of active medical problems listed below.  Physiatrist and rehab team continue to assess barriers to discharge/monitor patient  progress toward functional and medical goals  Care Tool:  Bathing    Body parts bathed by patient: Left arm, Chest, Abdomen, Right upper leg, Left upper leg, Face, Right lower leg, Front perineal area   Body parts bathed by helper: Right arm, Left lower leg, Buttocks     Bathing assist Assist Level: Moderate Assistance - Patient 50 - 74%     Upper Body Dressing/Undressing Upper body dressing   What is the patient wearing?: Pull over shirt    Upper body assist Assist Level: Moderate Assistance - Patient 50 - 74%    Lower Body Dressing/Undressing Lower body dressing      What is the patient wearing?: Incontinence brief, Pants     Lower body assist Assist for lower body dressing: Maximal Assistance - Patient 25 - 49%     Toileting Toileting    Toileting assist Assist for toileting: 2 Helpers     Transfers Chair/bed transfer  Transfers assist     Chair/bed transfer assist level: 2 Helpers (+2 max assist)     Locomotion Ambulation   Ambulation assist   Ambulation activity did not occur: Safety/medical concerns          Walk 10 feet activity   Assist  Walk 10 feet activity did not occur: Safety/medical concerns        Walk 50 feet activity   Assist Walk 50 feet with 2 turns activity did not occur: Safety/medical concerns  Walk 150 feet activity   Assist Walk 150 feet activity did not occur: Safety/medical concerns         Walk 10 feet on uneven surface  activity   Assist Walk 10 feet on uneven surfaces activity did not occur: Safety/medical concerns         Wheelchair     Assist Will patient use wheelchair at discharge?: Yes Type of Wheelchair: Manual Wheelchair activity did not occur: Safety/medical concerns         Wheelchair 50 feet with 2 turns activity    Assist    Wheelchair 50 feet with 2 turns activity did not occur: Safety/medical concerns       Wheelchair 150 feet activity     Assist  Wheelchair 150 feet activity did not occur: Safety/medical concerns          Medical Problem List and Plan: 1.Left side hemiparesis, now with spasticity and 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 Antithrombotics: -DVT/anticoagulation:Lovenox -antiplatelet therapy: Aspirin 325 mg daily and Plavix 75 mg daily x 3 weeks then aspirin alone 3. Pain Management:Tylenol and Advil as needed for groin and knee pain, voltaren gel for knee pain  Warm compress left groin 4. Mood:Provide emotional support  Amantadine with breakfast and lunch started on 8/25  Per daughter, somnolence is baseline with patient with inconsistent/erratic sleep/wake patterns, often falling asleep if not engaged in activities prior to admission.   Klonopin 0.25mg  daily prn added for anxiety.  -antipsychotic agents: N/A 5. Neuropsych: This patientis not fully of making decisions on herown behalf. 6. Skin/Wound Care:Routine skin checks.  7. Fluids/Electrolytes/Nutrition:Routine in and outs.  8. Hypertension:   Hydralazine - increased to 100mg  TID on 8/26, Norvasc 10 mg daily.   Imdur 30mg  and Clonidine 0.1mg  BID also added, now with better control.  Added metoprolol 12.5 mg BID  Elevated on 9/7, otherwise improving control  Monitor with increased mobility 9. Hyperlipidemia. Continue Lipitor 10. AKI on CKD. Normal renal function 2017 but no recent visits, does not have a PCP.   Cr.  1.57 on 9/6 11. Leukocytosis: Resolved 12. 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 Spasticity- worse in LLE   Increased dantrolene to 100mg  at night  15.  Tooth pain:   Outpatient dentist follow-up.  15. Hypokalemia: normalized   LOS: 14 days A FACE TO FACE EVALUATION WAS PERFORMED  Jeffree Cazeau Lorie Phenix 05/17/2020, 8:27 AM

## 2020-05-17 NOTE — Progress Notes (Signed)
Speech Language Pathology Daily Session Note  Patient Details  Name: [name] MRN: [id number] Date of Birth: 1961-03-13  Today's Date: 05/17/2020 SLP Individual Time: 0076-2263 SLP Individual Time Calculation (min): 45 min  Short Term Goals: Week 2: SLP Short Term Goal 1 (Week 2): Patient will consume current diet of Dys. 3 textures with thin liquids with minimal overt s/s of aspiration and Min A vebral cues for use of swallowing compensatory strategies. SLP Short Term Goal 2 (Week 2): Patient will demonstrate sustained attention to functional tasks for ~10 minutes with Mod A verbal cues for redirection. SLP Short Term Goal 3 (Week 2): Patient will scan to left visual field during functional tasks in 75% of opportunities with Mod A multimodal cues. SLP Short Term Goal 4 (Week 2): Patient will demonstrate functional problem solving for basic and familiar tasks with Mod A verbal cues. SLP Short Term Goal 5 (Week 2): Patient will utilize speech intelligibility strateies at the sentence level to achieve ~90% intelligibility with Min A verbal cues.  Skilled Therapeutic Interventions:   Patient seen for skilled ST session focusing on cognitive-linguistic goals. Patient's daughter [name] present during session. Patient completed sequencing of 3-picture action sequence and was 100% accurate for initial sequence then 2/3 for following three sequences. She corrected errors when SLP cued her to attend to errors. Patient demonstrated good task learning with task of matching color/shape/number with 4 field choices and was achieving 80% accuracy with supervision A but then started to decline and require modA for less than 70% accuracy. Patient continues to repeat things she says or other people say and this appears to be somewhat related to anxiety and/or hyperactivity. SLP observed patient to frequently say "okay, okay okay" and seemed to be impatient. She did start to look fatigued towards end of  session but overall was able to attend for duration of session without significant cues for alertness. She continues to benefit from skilled ST treatment to focus on cognitive-linguistic and swallow safety prior to discharge.   Pain Pain Assessment Pain Scale: 0-10 Pain Score: 0-No pain  Therapy/Group: Individual Therapy  Sonia Baller, MA, CCC-SLP 05/17/20 3:56 PM

## 2020-05-17 NOTE — Progress Notes (Signed)
Patient ID: [name], female   DOB: [birth year], 59 y.o.   MRN: [id number]  Made referral to [facility] for follow up home PT,OTR and SPT. Will begin working on equipment needs.

## 2020-05-18 ENCOUNTER — Inpatient Hospital Stay (HOSPITAL_COMMUNITY): Payer: 59 | Admitting: Occupational Therapy

## 2020-05-18 ENCOUNTER — Inpatient Hospital Stay (HOSPITAL_COMMUNITY): Payer: 59

## 2020-05-18 DIAGNOSIS — R52 Pain, unspecified: Secondary | ICD-10-CM

## 2020-05-18 NOTE — Progress Notes (Addendum)
[facility] PHYSICAL MEDICINE & REHABILITATION PROGRESS NOTE  Subjective/Complaints: Patient seen laying in bed this morning.  She states she did not sleep well overnight due to left groin pain.  She states she did not receive warm compress-stating she lost it.  ROS: + Left groin pain. Denies CP, SOB, N/V/D  Objective: Vital Signs: Blood pressure (!) 147/87, pulse 83, temperature 98.4 F (36.9 C), resp. rate 18, height 5\' 3"  (1.6 m), weight 67.2 kg, last menstrual period 09/15/2013, SpO2 99 %. No results found. Recent Labs    05/16/20 0720  WBC 9.4  HGB 9.9*  HCT 31.5*  PLT 181   Recent Labs    05/16/20 0720  NA 137  K 3.6  CL 102  CO2 25  GLUCOSE 103*  BUN 13  CREATININE 1.57*  CALCIUM 9.3    Physical Exam: BP (!) 147/87 (BP Location: Right Arm)   Pulse 83   Temp 98.4 F (36.9 C)   Resp 18   Ht 5\' 3"  (1.6 m)   Wt 67.2 kg   LMP 09/15/2013   SpO2 99%   BMI 26.24 kg/m  Constitutional: No distress . Vital signs reviewed. HENT: Normocephalic.  Atraumatic. Eyes: EOMI. No discharge. Cardiovascular: No JVD.  RRR. Respiratory: Normal effort.  No stridor.  Bilateral clear to auscultation. GI: Non-distended.  BS +. Skin: Warm and dry.  Intact. Psych: Perseverative speech Musc: Left arm TTP, no edema. Pain with IR/ER left hip Neuro: Alert Left facial weakness Dysarthria, unchanged Motor: LUE: Proximally 0/5, handgrip 2+/5 with apraxia, unchanged LLE: 0/5 trace movement, unchanged RLE: Hip flexion, knee extension trace, ankle dorsiflexion 3+/5 Increased tone noted LUE/LLE   Assessment/Plan: 1. 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  Physiatrist is providing close team supervision and 24 hour management of active medical problems listed below.  Physiatrist and rehab team continue to assess barriers to discharge/monitor patient progress toward  functional and medical goals  Care Tool:  Bathing    Body parts bathed by patient: Left arm, Chest, Abdomen, Right upper leg, Left upper leg, Face, Front perineal area   Body parts bathed by helper: Right arm, Left lower leg, Buttocks, Right lower leg     Bathing assist Assist Level: Moderate Assistance - Patient 50 - 74%     Upper Body Dressing/Undressing Upper body dressing   What is the patient wearing?: Pull over shirt    Upper body assist Assist Level: Moderate Assistance - Patient 50 - 74%    Lower Body Dressing/Undressing Lower body dressing      What is the patient wearing?: Incontinence brief, Pants     Lower body assist Assist for lower body dressing: 2 Helpers     Toileting Toileting    Toileting assist Assist for toileting: 2 Helpers     Transfers Chair/bed transfer  Transfers assist     Chair/bed transfer assist level: 2 Helpers     Locomotion Ambulation   Ambulation assist   Ambulation activity did not occur: Safety/medical concerns          Walk 10 feet activity   Assist  Walk 10 feet activity did not occur: Safety/medical concerns        Walk 50 feet activity   Assist Walk 50 feet with 2 turns activity did not occur: Safety/medical concerns         Walk 150 feet activity   Assist Walk 150 feet  activity did not occur: Safety/medical concerns         Walk 10 feet on uneven surface  activity   Assist Walk 10 feet on uneven surfaces activity did not occur: Safety/medical concerns         Wheelchair     Assist Will patient use wheelchair at discharge?: Yes Type of Wheelchair: Manual Wheelchair activity did not occur: Safety/medical concerns         Wheelchair 50 feet with 2 turns activity    Assist    Wheelchair 50 feet with 2 turns activity did not occur: Safety/medical concerns       Wheelchair 150 feet activity     Assist Wheelchair 150 feet activity did not occur: Safety/medical  concerns          Medical Problem List and Plan: 1.Left side hemiparesis, now with spasticity and dysarthriasecondary to right ACA infarction  Continue CIR   WHO/PRAFO qhs  Team conference today to discuss current and goals and coordination of care, home and environmental barriers, and discharge planning with nursing, case manager, and therapies. Please see conference note from today as well.  2. Antithrombotics: -DVT/anticoagulation:Lovenox -antiplatelet therapy: Aspirin 325 mg daily and Plavix 75 mg daily x 3 weeks (~9/14) then aspirin alone 3. Pain Management:Tylenol and Advil as needed for groin and knee pain, voltaren gel for knee pain  Warm compress left groin, cont - will order left hip xray 4. Mood:Provide emotional support  Amantadine with breakfast and lunch started on 8/25  Per daughter, somnolence is baseline with patient with inconsistent/erratic sleep/wake patterns, often falling asleep if not engaged in activities prior to admission.   Klonopin 0.25mg  daily prn added for anxiety.  -antipsychotic agents: N/A 5. Neuropsych: This patientis not fully of making decisions on herown behalf. 6. Skin/Wound Care:Routine skin checks.  7. Fluids/Electrolytes/Nutrition:Routine in and outs.  8. Hypertension:   Hydralazine - increased to 100mg  TID on 8/26, Norvasc 10 mg daily.   Imdur 30mg  and Clonidine 0.1mg  BID also added, now with better control.  Added metoprolol 12.5 mg BID  Elevated on 9/8, will consider medication adjustments if persistent  Monitor with increased mobility 9. Hyperlipidemia. Continue Lipitor 10. AKI on CKD. Normal renal function 2017 but no recent visits, does not have a PCP.   Cr.  1.57 on 9/6 11. Leukocytosis: Resolved 12. 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 Spasticity- worse in LLE   Increased dantrolene to 100mg  at night   Periodically monitor LFTs 15.   Tooth pain:   Outpatient dentist follow-up.  15. Hypokalemia: normalized   LOS: 15 days A FACE TO FACE EVALUATION WAS PERFORMED  Alesandro Stueve Lorie Phenix 05/18/2020, 9:03 AM

## 2020-05-18 NOTE — Progress Notes (Addendum)
Speech Language Pathology Weekly Progress and Session Note  Patient Details  Name: [name] MRN: [id number] Date of Birth: July 06, 1961  Beginning of progress report period: May 12, 2020 End of progress report period: May 18, 2020  Today's Date: 05/18/2020 SLP Individual Time: 9798-9211 SLP Individual Time Calculation (min): 44 min  Short Term Goals: Week 2: SLP Short Term Goal 1 (Week 2): Patient will consume current diet of Dys. 3 textures with thin liquids with minimal overt s/s of aspiration and Min A vebral cues for use of swallowing compensatory strategies. SLP Short Term Goal 1 - Progress (Week 2): Met SLP Short Term Goal 2 (Week 2): Patient will demonstrate sustained attention to functional tasks for ~10 minutes with Mod A verbal cues for redirection. SLP Short Term Goal 2 - Progress (Week 2): Met SLP Short Term Goal 3 (Week 2): Patient will scan to left visual field during functional tasks in 75% of opportunities with Mod A multimodal cues. SLP Short Term Goal 3 - Progress (Week 2): Met SLP Short Term Goal 4 (Week 2): Patient will demonstrate functional problem solving for basic and familiar tasks with Mod A verbal cues. SLP Short Term Goal 4 - Progress (Week 2): Met SLP Short Term Goal 5 (Week 2): Patient will utilize speech intelligibility strateies at the sentence level to achieve ~90% intelligibility with Min A verbal cues. SLP Short Term Goal 5 - Progress (Week 2): Met    New Short Term Goals: Week 3: SLP Short Term Goal 1 (Week 3): Patient will consume current diet of Dys. 3 textures with thin liquids with minimal overt s/s of aspiration and supervision A vebral cues for use of swallowing compensatory strategies. SLP Short Term Goal 2 (Week 3): Patient will demonstrate sustained attention to functional tasks for ~10 minutes with Min A verbal cues for redirection. SLP Short Term Goal 3 (Week 3): Patient will scan to left visual field during functional tasks  in 75% of opportunities with min A multimodal cues. SLP Short Term Goal 4 (Week 3): Patient will demonstrate functional problem solving for basic and familiar tasks with Min A verbal cues. SLP Short Term Goal 5 (Week 3): Patient will utilize speech intelligibility strateies at the sentence level to achieve ~90% intelligibility with supervision A verbal cues.  Weekly Progress Updates: Pt made good progress meeting 5 out 5 goals this reporting period. Pt demonstrated increase sustained attention in 5-10 minute intervals, basic problem solving, initiation, increase awareness of left visual field, reduced verbal preservations and increase oral control all to a moderate level. Pt's ability to participate in skilled ST services was limited by perseveration on pain and overall fatigue. Education with family is ongoing and SLP recommends continuing education. Pt would continue to benefit from skilled ST services in order to maximize functional independence and reduce burden of care, requiring 24 hour supervision at discharge with continued skilled ST services.     Intensity: Minumum of 1-2 x/day, 30 to 90 minutes Frequency: 3 to 5 out of 7 days Duration/Length of Stay: 9/22 Treatment/Interventions: Cognitive remediation/compensation;Cueing hierarchy;Dysphagia/aspiration precaution training;Functional tasks;Internal/external aids;Speech/Language facilitation;Patient/family education   Daily Session  Skilled Therapeutic Interventions: Skilled ST services focused on swallow and cognitive skills. Pt's daughter [name] was present for treatment session. Pt demonstrated overall improvement in attention, reduced verbal preservation and problem solving abilities since writer last treated pt. Today's treatment session was limited due to expressed back pain and pt required rest breaks in 5-10 minute intervals. Pt required mod A verbal cues  fade to min A verbal to reduce verbal preservation during communication. SLP  facilitated sustained attention, basic problem solving, left attention and error awareness in familiar card sorting task by 6 colors (with various shapes/number of shapes present.) pt required mod A verbal cues fade to mod I for initiation and left attention (card pile placed on left side), mod I for problem solving and supervision A verbal cues for error awareness. Pt demonstrated sustained attention in 10 minute interval with mod A verbal cues and emergent selective attention from environmental distractions. Pt consumed small amount of regular textured snack with min A verbal cues to clear oral residue and no overt s/s aspiration. Pt ultimately requested to end treatment session 15 minutes early due to fatigue and continued back pain. Pt had consumed pain medication prior to ST treatment session, therefore SLP assisted with repositioning and allowed pt to rest. Pt was left in room with daughter, call bell within reach and bed alarm set. SLP recommends to continue skilled services.  General    Pain Pain Assessment Faces Pain Scale: Hurts even more Pain Type: Acute pain Pain Location: Back Pain Orientation: Left Pain Descriptors / Indicators: Aching Pain Onset: On-going Pain Intervention(s): Repositioned;Rest  Therapy/Group: Individual Therapy  [name]  [facility] 05/18/2020, 3:37 PM

## 2020-05-18 NOTE — Patient Care Conference (Signed)
Inpatient RehabilitationTeam Conference and Plan of Care Update Date: 05/18/2020   Time: 11:09 AM    Patient Name: [name]      Medical Record Number: [id number]  Date of Birth: [birth year] Sex: Female         Room/Bed: 4M07C/4M07C-01 Payor Info: Payor: [employer]  / Plan: [employer] / Product Type: *No Product type* /    Admit Date/Time:  05/03/2020  3:56 PM  Primary Diagnosis:  CVA (cerebral vascular accident) [facility])  Hospital Problems: Principal Problem:   CVA (cerebral vascular accident) ([place]) Active Problems:   Pressure injury of skin   Thrombocytosis (HCC)   Leukocytosis   AKI (acute kidney injury) ([place])   Benign essential HTN   Lethargy   Dysphagia, post-stroke   Spastic hemiparesis (HCC)   Pain    Expected Discharge Date: Expected Discharge Date: 06/01/20  Team Members Present: Physician leading conference: Dr. Delice Lesch Care Coodinator Present: Dorien Chihuahua, RN, BSN, CRRN;Other (comment) Jacqlyn Larsen [place], SW) Nurse Present: Other (comment) Rufina Falco McGlosky, RN) PT Present: Other (comment) (Christain Manhard, PT) OT Present: Meriel Pica, OT SLP Present: Charolett Bumpers, SLP PPS Coordinator present : Roosevelt Locks, PT     Current Status/Progress Goal Weekly Team Focus  Bowel/Bladder   Incontinent of bowel and bladder; LBM 9/7  To become continent of bowel and bladder  Toileting assistance q 2-3 hours and PRN   Swallow/Nutrition/ Hydration   Dys 3, thin liquids min-modA  supervision  tolerating diet, use of swallow strategies, awareness of left side   ADL's   transfers with board total A of 2 (no change from last week),  improved static sit in supported surface (wc and BSC), improving attention and L side awareness to be able to bathe UB mod, dress UB mod, continues to require total A of 2  mod assist  ADL training, L side awareness, attention, postural control, LUE NMR, balance, pt/fam ed   Mobility   max/totalA  minA goals   sitting balance, transfers, standing frame, improving activity tolerance   Communication   modA functional communication and reduce perseveration on words/phrases seems to increase when patient anxious/hyperactive/impatient.  supervision  describe/comment/respond more specifically as she gives general responses   Safety/Cognition/ Behavioral Observations  modA  supervision  basic functional problem solving, sustained attention, attention to left   Pain   No complaints of pain  Patient rates pain < or equal to 3 out of 10  Assess pain q shift  and PRN   Skin   Healed stage 2 to sacrum  Prevent skinfrom breakdown and infection  Assess skin q shift and PRN     Discharge Planning:  Daughter-[name] here daily and other daughter-[name] comes on the weekends. Pt will be much care at discharge need to work on equipment needed.   Team Discussion: Groin pain = MD ordered xray. Adjusted medications for blood pressure and monitoring CKD. Note improved awareness of left side, positioning control, divided attention and problem solving.  atient on target to meet rehab goals: no, Minimal progress noted over past week Progress impaired by pain, fatigue, perseveration and alertness.  *See Care Plan and progress notes for long and short-term goals.   Revisions to Treatment Plan:   Teaching Needs: Decreased perseveration and interruptions by talking when eating and cues for clear thoughts, cognition and maintaining attention. Transfers, toileting, skin and wound care, medications, etc.  Current Barriers to Discharge: Decreased caregiver support, Home enviroment access/layout, Wound care and Insurance for SNF  coverage  Possible Resolutions to Barriers: Recommend ramp for entry to home (2 step entry) Family education with hands on practice with daughters and significant other     Medical Summary Current Status: Left side hemiparesis, now with spasticity and dysarthria secondary to right ACA  infarction  Barriers to Discharge: Decreased family/caregiver support;Medical stability;Medication compliance;Behavior   Possible Resolutions to [employer] Focus: Therapies, optimize spasticity meds, continue wound care, advance diet as tolerated, follow labs   Continued Need for Acute Rehabilitation Level of Care: The patient requires daily medical management by a physician with specialized training in physical medicine and rehabilitation for the following reasons: Direction of a multidisciplinary physical rehabilitation program to maximize functional independence : Yes Medical management of patient stability for increased activity during participation in an intensive rehabilitation regime.: Yes Analysis of laboratory values and/or radiology reports with any subsequent need for medication adjustment and/or medical intervention. : Yes   I attest that I was present, lead the team conference, and concur with the assessment and plan of the team.   Dorien Chihuahua B 05/18/2020, 3:32 PM

## 2020-05-18 NOTE — Progress Notes (Signed)
Occupational Therapy Session Note  Patient Details  Name: [name] MRN: [id number] Date of Birth: 03/28/1961  Today's Date: 05/18/2020 OT Individual Time: 1300-1346 OT Individual Time Calculation (min): 46 min    Short Term Goals: Week 2:  OT Short Term Goal 1 (Week 2): Pt will be able to maintain static sit at EOB with mod A to prep for slide transfer. OT Short Term Goal 2 (Week 2): Pt will be able to self correct posture with mod cues and min A when she leans too far to the L. OT Short Term Goal 3 (Week 2): Pt will don shirt with mod A. OT Short Term Goal 4 (Week 2): pt will complete slide board transfer with max A of 1 to increase potential for toilet transfers.  Skilled Therapeutic Interventions/Progress Updates:    Pt supine in bed, agreeable to OT session.  Pt complaining of left groin pain, no number provided, however reports of increased pain with movement.  Pt repositioned at end of session to reduce pain.    Pt completed supine to sit EOB with total assist +2.  Session focused on facilitation of self righting to midline using mirror for visual feedback to improve motor planning.  Pt needing max intermittent VCs and intermittent min to mod assist to right to center.  Pt did have multiple occasion of self righting to center for short period of time needing only CGA.  When pt provided with external distraction however, pt significantly leaned to left.  Decreased pushing noted today with RUE even when using for support.  Pt also noted with improved head alignment to midline and less lateral head leaning.  Pt needing a lot more support and having trouble righting self after about 10 minutes sitting EOB, therefore returned pt to supine with +2 assist.    Pt participated in left visual scanning and head rotation in supine to identify various ADL items held on this side.  Pt also completed left and right AROM needing external target of cheek to pillow to maximize quality of movement.   Pt returned to sitting with total assist +2 and completed self righting task as mentioned previously.  Pt improved righting skills after short rest period.  Pt returned to supine at end of session, call bell in reach, bed alarm on.  Therapy Documentation Precautions:  Precautions Precautions: Fall, Other (comment) Precaution Comments: L hemi, pushing tendency with RUE to weak L side Restrictions Weight Bearing Restrictions: No   Therapy/Group: Individual Therapy  Ezekiel Slocumb 05/18/2020, 4:56 PM

## 2020-05-18 NOTE — Progress Notes (Signed)
Occupational Therapy Session Note  Patient Details  Name: [name] MRN: [id number] Date of Birth: 25-Nov-1960  Today's Date: 05/18/2020 OT Individual Time: 4481-8563 OT Individual Time Calculation (min): 33 min    Short Term Goals: Week 2:  OT Short Term Goal 1 (Week 2): Pt will be able to maintain static sit at EOB with mod A to prep for slide transfer. OT Short Term Goal 2 (Week 2): Pt will be able to self correct posture with mod cues and min A when she leans too far to the L. OT Short Term Goal 3 (Week 2): Pt will don shirt with mod A. OT Short Term Goal 4 (Week 2): pt will complete slide board transfer with max A of 1 to increase potential for toilet transfers.  Skilled Therapeutic Interventions/Progress Updates:    Pt received in bed with a very wet brief on. With +2 A pt worked on rolling side to side for removal of brief, cleansing, donning new brief and pants. Pt did assist with rolling to L with her R arm with max A and cues. Pt resisting rolling to L due to groin pain.  Pt did cleanse front perineal area and helped to pull pants on 20%. Pt positioned in bed in chair position for UB dressing with max for bra and max for shirt.  Brushed teeth with set up and cues to fully rinse mouth.  Pt set up with tray table to prepare for lunch.    Bed alarm set and all needs met.  Therapy Documentation Precautions:  Precautions Precautions: Fall, Other (comment) Precaution Comments: L hemi, pushing tendency with RUE to weak L side Restrictions Weight Bearing Restrictions: No  Pain: Pain Assessment Faces Pain Scale: Hurts even more Pain Type: Acute pain Pain Location: Groin Pain Orientation: Left Pain Descriptors / Indicators: Aching Pain Onset: On-going Pain Intervention(s): Repositioned ADL: ADL Eating: Moderate assistance Where Assessed-Eating: Wheelchair Grooming: Moderate assistance Where Assessed-Grooming: Wheelchair Upper Body Bathing: Moderate  assistance Where Assessed-Upper Body Bathing: Wheelchair Lower Body Bathing: Maximal assistance Where Assessed-Lower Body Bathing: Chair Upper Body Dressing: Moderate assistance Where Assessed-Upper Body Dressing: Wheelchair Lower Body Dressing: Dependent Where Assessed-Lower Body Dressing: Chair Toileting: Dependent Where Assessed-Toileting: Glass blower/designer: Dependent (2  helpers, Warehouse manager) Armed forces technical officer Method: Other (comment) (slide board) Toilet Transfer Equipment: Drop arm bedside commode   Therapy/Group: Individual Therapy  [place] 05/18/2020, 12:58 PM

## 2020-05-18 NOTE — Progress Notes (Addendum)
Physical Therapy Session Note  Patient Details  Name: [name] MRN: [id number] Date of Birth: 1961/03/04  Today's Date: 05/18/2020 PT Individual Time: 1415-1515 PT Individual Time Calculation (min): 60 min   Short Term Goals: Week 2:  PT Short Term Goal 1 (Week 2): Pt will perform bed mobility with modA PT Short Term Goal 2 (Week 2): Pt will maintain unsupported sitting EOB with modA PT Short Term Goal 3 (Week 2): Pt will perform transfers with LRAD and modA  Skilled Therapeutic Interventions/Progress Updates:    Pt received supine in bed, sleeping on arrival but awakens easily to voice. On arrival, she c/o "10/10" and "sharp" L hip pain and perseverates on this throughout the entire session. RN and team aware. Pain not reproducible with palpation and appears to primarily be with L hip flexion. Rest breaks provided throughout session for pain relief and attempted gentle stretching with her supine including groin and hip add/abd stretches, pt with limited tolerance.   Donned pants while pt supine with maxA via bridging technique, encouraged her to use RUE as much as possible. Due to her limited sustained attention, required frequent cues for RUE participation. Performed rolling R with maxA and performed R sidelying to sit with maxA with HOB flat, cues for sequencing and technique. At EOB, required modA for sitting balance due to L lateral/posterior lean. Performed sliding board transfer with maxA +2 from EOB to TIS w/c. W/c transport with totalA for energy conservation from her room to main therapy gym. From there, performed squat pivot with maxA +2 from TIS to mat table. MaxA for repositioning hips and lateral scooting at EOM. Pt initially able to maintain short sitting EOM with CGA and RUE support on mat for ~2-3 minutes. Performed various sitting activities with mirror for visual feedback including target reaching with RUE across midline to place horse shoes on basketball rim, therapist  facilitating anterior pelvic tilt and R lateral lean. Pt with frequent LOB to the L, aware she is leaning but unable to correct. She required max/totalA for corrections from L lateral lean to upright. Pt then performed 1x10 R lateral elbow leans to midline focusing on initiation, correction, and sequencing. Attempted to perform light ball toss with RUE with +2 guarding however pt unable to effectively participate due to coordination and delayed motor planning/processing. At this time, her L hip pain became worse and therapist unable to redirect. She deferred further mobility/activity training and requested to return to room. Performed sliding board transfer with maxA +2 from EOM to TIS w/c and dep transport back to her room. Ended session reclined in TIS w/c with pillow bolsters supporting L HB. Belt alarm on, needs in reach. Missed 15 minutes of skilled therapy due to L groin pain.  Therapy Documentation Precautions:  Precautions Precautions: Fall, Other (comment) Precaution Comments: L hemi, pushing tendency with RUE to weak L side Restrictions Weight Bearing Restrictions: No Vital Signs: Therapy Vitals Temp: (!) 97.3 F (36.3 C) Pulse Rate: 79 Resp: 18 BP: 131/75 Patient Position (if appropriate): Lying Oxygen Therapy SpO2: 100 % O2 Device: Room Air Pain: Pt c/o L hip pain and perseverates on this throughout session. Rest breaks provided as needed as well as emotional support. Performed gentle stretching with her in supine for L groin stretch and hip abduction stretches. She was unable to tolerate and deferred further efforts of stretching.  Therapy/Group: Individual Therapy  Cleta Heatley P Soni Kegel PT 05/18/2020, 3:35 PM

## 2020-05-19 ENCOUNTER — Inpatient Hospital Stay (HOSPITAL_COMMUNITY): Payer: 59 | Admitting: Speech Pathology

## 2020-05-19 ENCOUNTER — Inpatient Hospital Stay (HOSPITAL_COMMUNITY): Payer: 59

## 2020-05-19 ENCOUNTER — Inpatient Hospital Stay (HOSPITAL_COMMUNITY): Payer: 59 | Admitting: Occupational Therapy

## 2020-05-19 DIAGNOSIS — K5901 Slow transit constipation: Secondary | ICD-10-CM

## 2020-05-19 DIAGNOSIS — D62 Acute posthemorrhagic anemia: Secondary | ICD-10-CM

## 2020-05-19 DIAGNOSIS — R1032 Left lower quadrant pain: Secondary | ICD-10-CM

## 2020-05-19 MED ORDER — MUSCLE RUB 10-15 % EX CREA
TOPICAL_CREAM | Freq: Three times a day (TID) | CUTANEOUS | Status: DC
  Filled 2020-05-19: qty 85

## 2020-05-19 MED ORDER — SENNOSIDES-DOCUSATE SODIUM 8.6-50 MG PO TABS
2.0000 | ORAL_TABLET | Freq: Every day | ORAL | Status: DC
  Administered 2020-05-19: 2 via ORAL
  Filled 2020-05-19: qty 2

## 2020-05-19 MED ORDER — POLYETHYLENE GLYCOL 3350 17 G PO PACK
17.0000 g | PACK | Freq: Two times a day (BID) | ORAL | Status: DC
  Administered 2020-05-19 – 2020-06-01 (×19): 17 g via ORAL
  Filled 2020-05-19 (×23): qty 1

## 2020-05-19 MED ORDER — METOPROLOL TARTRATE 25 MG PO TABS
25.0000 mg | ORAL_TABLET | Freq: Two times a day (BID) | ORAL | Status: DC
  Administered 2020-05-19 – 2020-06-01 (×26): 25 mg via ORAL
  Filled 2020-05-19 (×26): qty 1

## 2020-05-19 NOTE — Progress Notes (Signed)
[facility] PHYSICAL MEDICINE & REHABILITATION PROGRESS NOTE  Subjective/Complaints: Patient seen transferring with therapy this morning.  Daughter at bedside.  Patient states he slept well overnight.  She notes she still has groin pain.  Daughter notes constipation, confirmed with patient.  Patient also states that her WHO was not donned overnight.  Reminded patient to request from nursing.  ROS: + Left groin pain, slight improvement.  Denies CP, SOB, N/V/D  Objective: Vital Signs: Blood pressure (!) 151/89, pulse 73, temperature 98.4 F (36.9 C), resp. rate 18, height 5\' 3"  (1.6 m), weight 67.2 kg, last menstrual period 09/15/2013, SpO2 100 %. DG HIP UNILAT WITH PELVIS 2-3 VIEWS LEFT  Result Date: 05/18/2020 CLINICAL DATA:  Left groin pain. EXAM: DG HIP (WITH OR WITHOUT PELVIS) 2-3V LEFT COMPARISON:  None. FINDINGS: There is no evidence of hip fracture or dislocation. There is no evidence of arthropathy or other focal bone abnormality. IMPRESSION: Negative. Electronically Signed   By: Kathreen Devoid   On: 05/18/2020 10:39   No results for input(s): WBC, HGB, HCT, PLT in the last 72 hours. No results for input(s): NA, K, CL, CO2, GLUCOSE, BUN, CREATININE, CALCIUM in the last 72 hours.  Physical Exam: BP (!) 151/89 (BP Location: Right Arm)   Pulse 73   Temp 98.4 F (36.9 C)   Resp 18   Ht 5\' 3"  (1.6 m)   Wt 67.2 kg   LMP 09/15/2013   SpO2 100%   BMI 26.24 kg/m  Constitutional: No distress . Vital signs reviewed. HENT: Normocephalic.  Atraumatic. Eyes: EOMI. No discharge. Cardiovascular: No JVD.  RRR. Respiratory: Normal effort.  No stridor.  Bilateral clear to auscultation. GI: Non-distended.  BS +. Skin: Warm and dry.   Psych: Perseverative speech, somewhat improved today Musc: No edema in extremities.   Neuro: Alert Left facial weakness Dysarthria, stable Motor: LUE: Proximally 0/5, handgrip 2+/5 with apraxia, stable LLE: 0/5 proximal distal Increased tone noted LUE/LLE    Assessment/Plan: 1. 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  Physiatrist is providing close team supervision and 24 hour management of active medical problems listed below.  Physiatrist and rehab team continue to assess barriers to discharge/monitor patient progress toward functional and medical goals  Care Tool:  Bathing    Body parts bathed by patient: Left arm, Chest, Abdomen, Right upper leg, Left upper leg, Face, Front perineal area   Body parts bathed by helper: Right arm, Left lower leg, Buttocks, Right lower leg     Bathing assist Assist Level: Moderate Assistance - Patient 50 - 74%     Upper Body Dressing/Undressing Upper body dressing   What is the patient wearing?: Pull over shirt    Upper body assist Assist Level: Moderate Assistance - Patient 50 - 74%    Lower Body Dressing/Undressing Lower body dressing      What is the patient wearing?: Incontinence brief, Pants     Lower body assist Assist for lower body dressing: 2 Helpers     Toileting Toileting    Toileting assist Assist for toileting: 2 Helpers     Transfers Chair/bed transfer  Transfers assist     Chair/bed transfer assist level: 2 Helpers     Locomotion Ambulation   Ambulation assist   Ambulation activity did not occur: Safety/medical concerns          Walk 10 feet activity   Assist  Walk 10  feet activity did not occur: Safety/medical concerns        Walk 50 feet activity   Assist Walk 50 feet with 2 turns activity did not occur: Safety/medical concerns         Walk 150 feet activity   Assist Walk 150 feet activity did not occur: Safety/medical concerns         Walk 10 feet on uneven surface  activity   Assist Walk 10 feet on uneven surfaces activity did not occur: Safety/medical concerns         Wheelchair     Assist Will  patient use wheelchair at discharge?: Yes Type of Wheelchair: Manual Wheelchair activity did not occur: Safety/medical concerns         Wheelchair 50 feet with 2 turns activity    Assist    Wheelchair 50 feet with 2 turns activity did not occur: Safety/medical concerns       Wheelchair 150 feet activity     Assist Wheelchair 150 feet activity did not occur: Safety/medical concerns          Medical Problem List and Plan: 1.Left side hemiparesis, now with spasticity and dysarthriasecondary to right ACA infarction  Continue CIR   WHO/PRAFO nightly 2. Antithrombotics: -DVT/anticoagulation:Lovenox -antiplatelet therapy: Aspirin 325 mg daily and Plavix 75 mg daily x 3 weeks (~9/14) then aspirin alone 3. Pain Management:Tylenol and Advil as needed for groin and knee pain, voltaren gel for knee pain  Warm compress left groin, cont    Hip x-ray negative   Muscle rub ordered 4. Mood:Provide emotional support  Amantadine with breakfast and lunch started on 8/25  Per daughter, somnolence is baseline with patient with inconsistent/erratic sleep/wake patterns, often falling asleep if not engaged in activities prior to admission.   Klonopin 0.25mg  daily prn added for anxiety.  -antipsychotic agents: N/A 5. Neuropsych: This patientis not fully of making decisions on herown behalf. 6. Skin/Wound Care:Routine skin checks.  7. Fluids/Electrolytes/Nutrition:Routine in and outs.  8. Hypertension:   Hydralazine - increased to 100mg  TID on 8/26, Norvasc 10 mg daily.   Imdur 30mg  and Clonidine 0.1mg  BID also added, now with better control.  Added metoprolol 12.5 mg BID, increased to 25 on 9/9  Monitor with increased mobility 9. Hyperlipidemia. Continue Lipitor 10. AKI on CKD. Normal renal function 2017 but no recent visits, does not have a PCP.   Cr.  1.57 on 9/6 11. Leukocytosis: Resolved 12. Thrombocytopenia: Resolved 13.  Post stroke  dysphagia-  D3 thins, advance as tolerated 14. Spasticity- worse in LLE   Increased dantrolene to 100mg  at night   Periodically monitor LFTs 15.  Tooth pain:   Outpatient dentist follow-up.  15. Hypokalemia: normalized  16.  Acute blood loss anemia  Hemoglobin 9.9 on 9/6, labs ordered for tomorrow 17.  Slow transit constipation  Bowel meds increased on 9/9   LOS: 16 days A FACE TO FACE EVALUATION WAS PERFORMED  Sahily Biddle Lorie Phenix 05/19/2020, 10:27 AM

## 2020-05-19 NOTE — Progress Notes (Signed)
Speech Language Pathology Daily Session Note  Patient Details  Name: LINZI OHLINGER MRN: [id number] Date of Birth: 19-Feb-1961  Today's Date: 05/19/2020 SLP Individual Time: 9518-8416 SLP Individual Time Calculation (min): 55 min  Short Term Goals: Week 3: SLP Short Term Goal 1 (Week 3): Patient will consume current diet of Dys. 3 textures with thin liquids with minimal overt s/s of aspiration and supervision A vebral cues for use of swallowing compensatory strategies. SLP Short Term Goal 2 (Week 3): Patient will demonstrate sustained attention to functional tasks for ~10 minutes with Min A verbal cues for redirection. SLP Short Term Goal 3 (Week 3): Patient will scan to left visual field during functional tasks in 75% of opportunities with min A multimodal cues. SLP Short Term Goal 4 (Week 3): Patient will demonstrate functional problem solving for basic and familiar tasks with Min A verbal cues. SLP Short Term Goal 5 (Week 3): Patient will utilize speech intelligibility strateies at the sentence level to achieve ~90% intelligibility with supervision A verbal cues.  Skilled Therapeutic Interventions: Pt was seen for skilled ST targeting cognitive-linguistic skills. Pt required Moderate verbal cues for visual scanning during a trail making task, as well as Min A for problem solving and attention to task. Min A verbal and visual cues were provided for problem solving and 1 verbal cue for error awareness during a basic money management task involving cash and coins. Most cueing was required to calculate change (subtraction) and totals (addition), however displaying and counting set amounts in front of her was done with more independence. Only mild perseveration noted on certain phrases, and verbalizing during tasks appears to assist her with focus and problem solving, generally.  Pt became limited by pain in right groin, requiring more frequent cues for redirection to task at end of session, but  otherwise Min A was sufficient. Pt left sitting in chair with alarm set and needs within reach. Continue per current plan of care.          Pain Pain Assessment Pain Scale: 0-10 Pain Score: 5  Pain Location: Groin  Therapy/Group: Individual Therapy  Arbutus Leas 05/19/2020, 3:03 PM

## 2020-05-19 NOTE — Progress Notes (Signed)
Physical Therapy Session Note  Patient Details  Name: [name] MRN: [id number] Date of Birth: 1960/11/16  Today's Date: 05/19/2020 PT Individual Time: 3664-4034 + 1500-1530 PT Individual Time Calculation (min): 45 min + 30 min  Short Term Goals: Week 2:  PT Short Term Goal 1 (Week 2): Pt will perform bed mobility with modA PT Short Term Goal 2 (Week 2): Pt will maintain unsupported sitting EOB with modA PT Short Term Goal 3 (Week 2): Pt will perform transfers with LRAD and modA  Skilled Therapeutic Interventions/Progress Updates:      AM session: Pt received sleeping supine in bed, awakens easily to voice, agreeable to PT session. Pt continues to report L hip pain and would often perseverate on this throughout our session. Pt with saturated brief on arrival, incontinent of urine. Required maxA +1 for rolling L<>R with HOB flat with cues for RUE participation and initiation. She required modA for maintaining sidelying position. TotalA for donning/doffing brief and handed her soaped wash cloth for her to use RUE to perform pericare. Once a new brief was placed, she had another episode of urinary incontinence, thus repeated the steps described above for pericare and donning new brief. TotalA for donning pants for time management. Performed supine<>sit with maxA with HOB flat, maxA for sitting EOB unsupported. She was able to maintain sitting EOB with RUE to footboard with CGA. Donned scrub shirt with maxA via threading with LUE. Setup breakfast tray while seated EOB and she was able to take a few bites of oatmeal using RUE for feed, requiring maxA from therapist for sitting balance and cues throughout to encourage midline orientation and forward gaze. Daughter, [name], arriving therafter so maxA +2 sliding board transfer from EOB to TIS w/c where she was properly positioned. Ended session with daughter at bedside, belt alarm on.   PM session: Pt received sitting in TIS w/c, agreeable to  PT session. Pt reports unrated L groin/hip pain, team aware and hot pack applied for comfort. Rest breaks also provided throughout session as needed. WC transport with totalA to small therapy gym, performed sliding board transfer with maxA +1 from w/c to mat table with therapist cueing for RUE placement and head/hip technique. While sitting EOM, mirror placed in front for visual feedback for midline orientation. She showed brief periods of time where she was able to maintain sitting EOB with CGA however once she began to lean L, she is unable to correct. Performed x2 sit<>stand transfers with maxA +2 and L knee block, unable to achieve full upright as her hips are shifted backwards and knee's flexed. Multiple efforts of repositioning in standing with multi-modal cueing for erect posture but limited carryover. Sliding board transfer with maxA +1 back to TIS w/c and transported back to room. Sliding board transfer again with maxA +1 form TIS w/c to EOB, sit>supine with totalA +2 and supine scoot dep +2 for repositioning. Pt ended session semi-reclined in bed, 3/4 bed rails up, needs in reach, bed alarm on.   Therapy Documentation Precautions:  Precautions Precautions: Fall, Other (comment) Precaution Comments: L hemi, pushing tendency with RUE to weak L side Restrictions Weight Bearing Restrictions: No Vital Signs: Therapy Vitals Temp: 98.4 F (36.9 C) Pulse Rate: 73 Resp: 18 BP: (!) 151/89 Patient Position (if appropriate): Lying Oxygen Therapy SpO2: 100 % O2 Device: Room Air Pain: Pain Assessment Pain Scale: 0-10 Pain Score: 5  Pain Location: Groin  Therapy/Group: Individual Therapy  Sie Formisano P Derk Doubek PT 05/19/2020, 9:52 AM

## 2020-05-19 NOTE — Progress Notes (Addendum)
Patient ID: [name], female   DOB: 1961-03-17, 59 y.o.   MRN: [id number]  Met with pt and daughter-[name] to discuss team conference progress and discharge date still 9/22. Addressed concerns regarding amount of care pt will require at home and if her daughter that lives in [place] can do this on the weekend when she is here due to other daughter working. Pt voiced: " I'm going home from here." [name] voiced her sister will need to come and do education prior to discharge. Spoke in front of pt regarding options available and that she had coverage for short term rehab but [facility]ock is not in network with it. [name] voiced their step-father can assist some but has had knee replacements and needs to be careful. He has not broken his leg like pt's daughter-[name] reported. [name] to begin SSD application due to pt will not be able to return to work anytime soon if ever. Pt insistent she is going home from here. Will continue to work on safe discharge plan.  10:00 am Dan-PA did answer their questions regarding x-ray yesterday and options for groin pain. Ordering K-pad and already taking tramadol

## 2020-05-19 NOTE — Progress Notes (Signed)
Occupational Therapy Session Note  Patient Details  Name: [name] MRN: [id number] Date of Birth: Jan 15, 1961  Today's Date: 05/19/2020 OT Individual Time: 1000-1100 OT Individual Time Calculation (min): 60 min    Short Term Goals: Week 2:  OT Short Term Goal 1 (Week 2): Pt will be able to maintain static sit at EOB with mod A to prep for slide transfer. OT Short Term Goal 2 (Week 2): Pt will be able to self correct posture with mod cues and min A when she leans too far to the L. OT Short Term Goal 3 (Week 2): Pt will don shirt with mod A. OT Short Term Goal 4 (Week 2): pt will complete slide board transfer with max A of 1 to increase potential for toilet transfers.  Skilled Therapeutic Interventions/Progress Updates:    Pt sitting up in TIS w/c with dtr in room, pt c/o pain in groin, agreeable to OT session.  Pt completed UB self care sitting sinkside.  Pt needed frequent VCs to stay on task and sequence through each task.  Pt doffed shirt and bra with mod assist and max VCs.  Pt bathed UB with mod assist for back and RUE.  Pt able to pull self forward using RUE at sink with VCs to initiate.  Pt brushed teeth with supervision needing step by step cues for hemi technique, however improved carryover of technique and following commands noted today.  Pt donned bra and shirt with mod assist and step by step cues.   Pt transported to ortho gym to focus on neuro re-ed to facilitate improved static and dynamic sitting balance and self righting skills.  Pt provided mirror for visual feedback to improve motor planning.  Pt completed sliding board transfer with total assist +2.  Pt sat EOM with OT on right side and rehab tech on left side.  Pt provided intermittent VCs to lean to right to achieve midline.  Pt able to right to midline with CGA initially.  Pt reached for horseshoes with facilitation of right and left forward leaning.  After approximately 5 minutes sitting EOM, pt needing increased  physical assist to achieve midline from min to mod assist.  Pt returned to w/c via sliding board with total assist +2.  Transported back to room, call bell in reach, lap tray placed, dtr in room.    Therapy Documentation Precautions:  Precautions Precautions: Fall, Other (comment) Precaution Comments: L hemi, pushing tendency with RUE to weak L side Restrictions Weight Bearing Restrictions: No   Therapy/Group: Individual Therapy  Ezekiel Slocumb 05/19/2020, 4:03 PM

## 2020-05-19 NOTE — Progress Notes (Signed)
Pt c/o front lower dental pain. When providing oral care pt states there is increased pain to the teeth, and requesting something to help with that pain.

## 2020-05-20 ENCOUNTER — Inpatient Hospital Stay (HOSPITAL_COMMUNITY): Payer: 59 | Admitting: Occupational Therapy

## 2020-05-20 ENCOUNTER — Inpatient Hospital Stay (HOSPITAL_COMMUNITY): Payer: 59

## 2020-05-20 DIAGNOSIS — L89159 Pressure ulcer of sacral region, unspecified stage: Secondary | ICD-10-CM

## 2020-05-20 LAB — CBC WITH DIFFERENTIAL/PLATELET
Abs Immature Granulocytes: 0.02 10*3/uL (ref 0.00–0.07)
Basophils Absolute: 0.1 10*3/uL (ref 0.0–0.1)
Basophils Relative: 1 %
Eosinophils Absolute: 0.2 10*3/uL (ref 0.0–0.5)
Eosinophils Relative: 2 %
HCT: 32.3 % — ABNORMAL LOW (ref 36.0–46.0)
Hemoglobin: 10.1 g/dL — ABNORMAL LOW (ref 12.0–15.0)
Immature Granulocytes: 0 %
Lymphocytes Relative: 13 %
Lymphs Abs: 1.1 10*3/uL (ref 0.7–4.0)
MCH: 28 pg (ref 26.0–34.0)
MCHC: 31.3 g/dL (ref 30.0–36.0)
MCV: 89.5 fL (ref 80.0–100.0)
Monocytes Absolute: 0.8 10*3/uL (ref 0.1–1.0)
Monocytes Relative: 10 %
Neutro Abs: 6 10*3/uL (ref 1.7–7.7)
Neutrophils Relative %: 74 %
Platelets: 185 10*3/uL (ref 150–400)
RBC: 3.61 MIL/uL — ABNORMAL LOW (ref 3.87–5.11)
RDW: 14 % (ref 11.5–15.5)
WBC: 8.1 10*3/uL (ref 4.0–10.5)
nRBC: 0 % (ref 0.0–0.2)

## 2020-05-20 MED ORDER — SENNOSIDES-DOCUSATE SODIUM 8.6-50 MG PO TABS
2.0000 | ORAL_TABLET | Freq: Two times a day (BID) | ORAL | Status: DC
  Administered 2020-05-20 – 2020-05-31 (×19): 2 via ORAL
  Filled 2020-05-20 (×22): qty 2

## 2020-05-20 NOTE — Progress Notes (Signed)
Patient ID: [name], female   DOB: [birth year], 59 y.o.   MRN: [id number]  Met with [name]-daughter who is here to answer her questions regarding SSD. Discussed she or step-dad can begin the process and gave her information for this. She asked if they should get a disability attorney to assist with this. Some do this to assist with completion and getting approved. Will get daughter medical records which she will need to begin this process. Pt reports sleeping a little better but groin still hurts but has k-pad on now.

## 2020-05-20 NOTE — Plan of Care (Signed)
  Problem: RH Ambulation Goal: LTG Patient will ambulate in controlled environment (PT) Description: LTG: Patient will ambulate in a controlled environment, # of feet with assistance (PT). Outcome: Not Applicable Note: DC goal   Problem: RH Stairs Goal: LTG Patient will ambulate up and down stairs w/assist (PT) Description: LTG: Patient will ambulate up and down # of stairs with assistance (PT) Outcome: Not Applicable Note: DC goal

## 2020-05-20 NOTE — Progress Notes (Signed)
Occupational Therapy Weekly Progress Note  Patient Details  Name: FRANCI OSHANA MRN: [id number] Date of Birth: 1960/12/02  Beginning of progress report period: May 11, 2020 End of progress report period: May 20, 2020  Today's Date: 05/20/2020 OT Individual Time: 1347-1440 OT Individual Time Calculation (min): 53 min    Patient has met 1 of 4 short term goals.  Pt is progressing slowly but there are slight improvements noted including pts ability to right to center and attend to left side. Pt is also turning head with more control and maintaining head alignment to center actively which assists in ADL participation.  Barriers to progress this week have included bouts of significant constipation, pain in left hip adductor region, and intermittent fatigue.  Pt requires max VCs still to self right to center but improved motor planning to do so noted and able to sit upright with CGA for period of time.  Pt does get fatigued after 10 to 15 minutes of static sitting which then requires more physical assist to self right to midline.  Pt is perseverating on tasks less which has helped increase her independence during oral hygiene and UB dressing.      Patient continues to demonstrate the following deficits: muscle weakness, decreased cardiorespiratoy endurance, impaired timing and sequencing, abnormal tone, unbalanced muscle activation, motor apraxia, ataxia, decreased coordination and decreased motor planning, decreased visual perceptual skills and decreased visual motor skills, decreased midline orientation, decreased attention to left, left side neglect and decreased motor planning, decreased attention, decreased awareness, decreased problem solving, decreased safety awareness, decreased memory and delayed processing and decreased sitting balance, decreased standing balance, decreased postural control, hemiplegia and decreased balance strategies and therefore will continue to benefit from  skilled OT intervention to enhance overall performance with BADL.  Patient progressing toward long term goals..  Continue plan of care.  OT Short Term Goals Week 2:  OT Short Term Goal 1 (Week 2): Pt will be able to maintain static sit at EOB with mod A to prep for slide transfer. OT Short Term Goal 1 - Progress (Week 2): Met OT Short Term Goal 2 (Week 2): Pt will be able to self correct posture with mod cues and min A when she leans too far to the L. OT Short Term Goal 2 - Progress (Week 2): Progressing toward goal OT Short Term Goal 3 (Week 2): Pt will don shirt with mod A. OT Short Term Goal 3 - Progress (Week 2): Progressing toward goal OT Short Term Goal 4 (Week 2): pt will complete slide board transfer with max A of 1 to increase potential for toilet transfers. OT Short Term Goal 4 - Progress (Week 2): Progressing toward goal Week 3:  OT Short Term Goal 1 (Week 3): Pt will be able to self correct posture with mod cues and min A when she leans too far to the L. OT Short Term Goal 2 (Week 3): Pt will don shirt with mod A. OT Short Term Goal 3 (Week 3): pt will complete slide board transfer with max A of 1 to increase potential for toilet transfers.  Skilled Therapeutic Interventions/Progress Updates:    Pt sitting up on [facility] with nurse tech and rehab tech present.  Pt appearing anxious and with discomfort due to reported constipation.  Nurse made aware of pts constipation and medication administered nearing end of session.  Pt reports she would like to sit on [facility] for a little longer to attempt bowel movement.  Facilitation of pt  self righting to midline completed utilizing max VCs and visual target.  Pt able to self right with CGA initially with cueing.  Pt also cue'd to turn head to left to attend to stimulus , while maintaining midline trunk position, however pt needing min assist to maintain balance with left head rotation.  Pt completed sliding board transfer BSC to EOB with total assist +2  using chuck pad to protect skin.  Total assist +2 sit to supine.  Pt rolled to left with min assist for dependent pericare and right roll with max assist.  Pt repositioned towards [facility] with total assist +2.  Nurse arrived for medication admin.  Call bell in reach, bed alarm on.    Therapy Documentation Precautions:  Precautions Precautions: Fall, Other (comment) Precaution Comments: L hemi, pushing tendency with RUE to weak L side Restrictions Weight Bearing Restrictions: No   ADL: ADL Eating: Moderate assistance Where Assessed-Eating: Wheelchair Grooming: Moderate assistance Where Assessed-Grooming: Wheelchair Upper Body Bathing: Moderate assistance Where Assessed-Upper Body Bathing: Wheelchair Lower Body Bathing: Maximal assistance Where Assessed-Lower Body Bathing: Chair Upper Body Dressing: Moderate assistance Where Assessed-Upper Body Dressing: Wheelchair Lower Body Dressing: Dependent Where Assessed-Lower Body Dressing: Chair Toileting: Dependent Where Assessed-Toileting: Glass blower/designer: Dependent (2  helpers, Warehouse manager) Armed forces technical officer Method: Other (comment) (slide board) Toilet Transfer Equipment: Drop arm bedside commode   Therapy/Group: Individual Therapy  Ezekiel Slocumb 05/20/2020, 5:25 PM

## 2020-05-20 NOTE — Progress Notes (Signed)
Physical Therapy Weekly Progress Note  Patient Details  Name: [name] MRN: [id number] Date of Birth: 1960-10-18  Beginning of progress report period: May 11, 2020 End of progress report period: May 20, 2020  Today's Date: 05/20/2020 PT Individual Time: 0932-3557 + 1130-1200 PT Individual Time Calculation (min): 45 min + 30 min  Patient has met 1 of 3 short term goals.  Pt making minimal progress with bed mobility and transfers as she still requires max to total assist. She has demonstrated improvement in sitting balance by showing brief periods of sitting unsupported for a few minutes with CGA. However, once she loses her balance (typically L and posterior), she requires totalA for corrections although she is aware she is falling. Improvements in cervical posturing, verbal and motor perseveration's, and activity tolerance.   Patient continues to demonstrate the following deficits muscle weakness, impaired timing and sequencing, abnormal tone, unbalanced muscle activation, motor apraxia, decreased coordination and decreased motor planning, decreased midline orientation, decreased attention to left, decreased motor planning and ideational apraxia and decreased initiation, decreased attention, decreased awareness, decreased problem solving, decreased safety awareness and delayed processing and therefore will continue to benefit from skilled PT intervention to increase functional independence with mobility.  Patient not progressing toward long term goals.  See LTG revision; goals downgraded to mod/maxA. Will continue plan of care and incorporate family training during sessions.  PT Short Term Goals Week 3:  PT Short Term Goal 1 (Week 3): STG = LTG due to ELOS  Skilled Therapeutic Interventions/Progress Updates:     AM session:  Pt received supine in bed, daughter [name] at bedside, pt agreeable to PT session. Pt reports unrated L hip pain, k-pad applied intermittently  during session for pain relief. Pt with soiled brief on arrival, she was unaware that it wasn't clean. Required totalA for removing brief and mod/maxA for rolling L<>R with bed features. When handed soaped washcloth, she was able to perform pericare with setupA although she did require VC to avoid using the same dirty washcloth that she used on her lower body on her face. Donned new brief with totalA and pants with maxA with encouragement for using RUE as much as possible to pull pants up. Supine<>sit with maxA +1 with VC for sequencing. Once EOB, able to sit with CGA with RUE grasping bedrail, otherwise fluctuating b/w mod to maxA for maintaining balance unsupported. Provided new washcloth for her to perform upper body sponge bath, requiring maxA for maintaining balance but she was able to use RUE to cleanse as appropriate. TotalA for washing back. Required maxA for donning new shirt with VC for step-by-step sequencing. From there, session focused on family training with [name], specifically transfer training. PT performed demonstration sliding board transfer with maxA from EOB to TIS w/c, educating daughter throughout on setup, positioning, and technique. Daughter performed x2 sliding board transfers with PT providing CGA for safety. Daughter with safe technique but will benefit from further training in future sessions to ensure comfort and pt safety. Pt ended session seated in w/c, needs in reach, daughter at bedside. k-pad applied for pain.   PM session: Pt received sitting in TIS w/c, daughter [name] at bedside, pt agreeable to PT session. K-pad on L hip for pain management. Pt again incontinent of urine on arrival, saturated brief. Sliding board transfer with maxA of 1 from TIS to EOB with cues for technique and head/hip. Sit>R sidelying with maxA with cues for R elbow lean > shldr lean, requiring maxA for  BLE management. NT present for +2 pericare, pt rolling L<>R with maxA using bed features and NT  performed pericare for time management. LB dressing with maxA/totalA while she was supine. Returned to sitting EOB requiring maxA for completion with cues for initiation and technique. From there, we focused on sitting balance EOB with and without RUE grip to hand rail. Intermittent bouts of CGA without UBE support and a few attempts of self correction from frequent LOB's posterior and L. x1 sit<>stand with maxA from raised EOB height with B knee block via face-to-face technique; in standing she demo's hinged hips with forward flexed trunk and genu varus in both knees. Sliding board transfer with maxA of one back to her w/c. Daughter present throughout session and was informed of mobility tactics, purpose of sitting balance, and hopeful progressions. Pt ended session comfortable in her TIS w/c, needs in reach, k-pad applied, daughter at bedside.  Therapy Documentation Precautions:  Precautions Precautions: Fall, Other (comment) Precaution Comments: L hemi, pushing tendency with RUE to weak L side Restrictions Weight Bearing Restrictions: No Vital Signs: Therapy Vitals Temp: 98.2 F (36.8 C) Pulse Rate: 71 Resp: 16 BP: 137/86 Patient Position (if appropriate): Lying Oxygen Therapy SpO2: 100 % O2 Device: Room Air  Therapy/Group: Individual Therapy  Cleaster Shiffer P Nikkia Devoss PT 05/20/2020, 7:29 AM

## 2020-05-20 NOTE — Progress Notes (Signed)
[facility] PHYSICAL MEDICINE & REHABILITATION PROGRESS NOTE  Subjective/Complaints: Patient seen laying in bed this morning.  She states she did not sleep well overnight due to being in the hospital.  She notes improvement in groin pain with muscle rub.  Her orthoses were donned overnight.  Discussed coccyx wound with nursing.  ROS: + Left groin pain, improving.  Denies CP, SOB, N/V/D  Objective: Vital Signs: Blood pressure 137/86, pulse 71, temperature 98.2 F (36.8 C), resp. rate 16, height 5\' 3"  (1.6 m), weight 67.2 kg, last menstrual period 09/15/2013, SpO2 100 %. DG HIP UNILAT WITH PELVIS 2-3 VIEWS LEFT  Result Date: 05/18/2020 CLINICAL DATA:  Left groin pain. EXAM: DG HIP (WITH OR WITHOUT PELVIS) 2-3V LEFT COMPARISON:  None. FINDINGS: There is no evidence of hip fracture or dislocation. There is no evidence of arthropathy or other focal bone abnormality. IMPRESSION: Negative. Electronically Signed   By: Kathreen Devoid   On: 05/18/2020 10:39   No results for input(s): WBC, HGB, HCT, PLT in the last 72 hours. No results for input(s): NA, K, CL, CO2, GLUCOSE, BUN, CREATININE, CALCIUM in the last 72 hours.  Physical Exam: BP 137/86 (BP Location: Right Arm)   Pulse 71   Temp 98.2 F (36.8 C)   Resp 16   Ht 5\' 3"  (1.6 m)   Wt 67.2 kg   LMP 09/15/2013   SpO2 100%   BMI 26.24 kg/m  Constitutional: No distress . Vital signs reviewed. HENT: Normocephalic.  Atraumatic. Eyes: EOMI. No discharge. Cardiovascular: No JVD.  RRR. Respiratory: Normal effort.  No stridor.  Bilateral clear to auscultation. GI: Non-distended.  BS +. Skin: Warm and dry.  Intact. Psych: Perseverative speech, some improvement Musc: No edema in extremities.  No tenderness in extremities. Neuro: Alert Left facial weakness Dysarthria, unchanged Motor: LUE: Proximally 0/5, handgrip 2+/5 with apraxia, unchanged LLE: 0/5 proximal distal, unchanged Increased tone noted LUE/LLE   Assessment/Plan: 1. 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  Physiatrist is providing close team supervision and 24 hour management of active medical problems listed below.  Physiatrist and rehab team continue to assess barriers to discharge/monitor patient progress toward functional and medical goals  Care Tool:  Bathing    Body parts bathed by patient: Left arm, Chest, Abdomen, Face   Body parts bathed by helper: Right arm     Bathing assist Assist Level: Moderate Assistance - Patient 50 - 74%     Upper Body Dressing/Undressing Upper body dressing   What is the patient wearing?: Pull over shirt, Bra    Upper body assist Assist Level: Moderate Assistance - Patient 50 - 74%    Lower Body Dressing/Undressing Lower body dressing      What is the patient wearing?: Incontinence brief, Pants     Lower body assist Assist for lower body dressing: 2 Helpers     Toileting Toileting    Toileting assist Assist for toileting: 2 Helpers     Transfers Chair/bed transfer  Transfers assist     Chair/bed transfer assist level: 2 Helpers     Locomotion Ambulation   Ambulation assist   Ambulation activity did not occur: Safety/medical concerns          Walk 10 feet activity   Assist  Walk 10 feet activity did not occur: Safety/medical concerns        Walk 50 feet activity   Assist Walk  50 feet with 2 turns activity did not occur: Safety/medical concerns         Walk 150 feet activity   Assist Walk 150 feet activity did not occur: Safety/medical concerns         Walk 10 feet on uneven surface  activity   Assist Walk 10 feet on uneven surfaces activity did not occur: Safety/medical concerns         Wheelchair     Assist Will patient use wheelchair at discharge?: Yes Type of Wheelchair: Manual Wheelchair activity did not occur: Safety/medical  concerns         Wheelchair 50 feet with 2 turns activity    Assist    Wheelchair 50 feet with 2 turns activity did not occur: Safety/medical concerns       Wheelchair 150 feet activity     Assist Wheelchair 150 feet activity did not occur: Safety/medical concerns          Medical Problem List and Plan: 1.Left side hemiparesis, 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  Continue CIR  WHO/PRAFO nightly 2. Antithrombotics: -DVT/anticoagulation:Lovenox -antiplatelet therapy: Aspirin 325 mg daily and Plavix 75 mg daily x3 weeks (~9/14) then aspirin alone 3. Pain Management:Tylenol and Advil as needed for groin and knee pain, voltaren gel for knee pain  Warm compress left groin, cont    Hip x-ray negative   Continue muscle rub with benefit 4. Mood:Provide emotional support  Amantadine with breakfast and lunch started on 8/25  Per daughter, somnolence is baseline with patient with inconsistent/erratic sleep/wake patterns, often falling asleep if not engaged in activities prior to admission.   Klonopin 0.25mg  daily prn added for anxiety.  -antipsychotic agents: N/A 5. Neuropsych: This patientis not fully of making decisions on herown behalf. 6. Skin/Wound Care:Routine skin checks.  7. Fluids/Electrolytes/Nutrition:Routine in and outs.  8. Hypertension:   Hydralazine - increased to 100mg  TID on 8/26, Norvasc 10 mg daily.   Imdur 30mg  and Clonidine 0.1mg  BID also added, now with better control.  Added metoprolol 12.5 mg BID, increased to 25 on 9/9  Controlled on 9/10  Monitor with increased mobility 9. Hyperlipidemia. Continue Lipitor 10. AKI on CKD. Normal renal function 2017 but no recent visits, does not have a PCP.   Cr.  1.57 on 9/6, labs ordered for Monday 11. Leukocytosis: Resolved 12. 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 Spasticity- worse  in LLE   Increased dantrolene to 100mg  at night, will consider further increase next week  Periodically monitor LFTs 15.  Tooth pain:   Outpatient dentist follow-up.  15. Hypokalemia: normalized  16.  Acute blood loss anemia  Hemoglobin 9.9 on 9/6, labs pending 17.  Slow transit constipation  Bowel meds increased on 9/9, again on 9/10   LOS: 17 days A FACE TO FACE EVALUATION WAS PERFORMED  Smayan Hackbart Lorie Phenix 05/20/2020, 9:03 AM

## 2020-05-20 NOTE — Progress Notes (Signed)
Speech Language Pathology Daily Session Note  Patient Details  Name: [name] MRN: [id number] Date of Birth: 11-10-1960  Today's Date: 05/20/2020 SLP Individual Time: 1015-1040 SLP Individual Time Calculation (min): 25 min  Short Term Goals: Week 3: SLP Short Term Goal 1 (Week 3): Patient will consume current diet of Dys. 3 textures with thin liquids with minimal overt s/s of aspiration and supervision A vebral cues for use of swallowing compensatory strategies. SLP Short Term Goal 2 (Week 3): Patient will demonstrate sustained attention to functional tasks for ~10 minutes with Min A verbal cues for redirection. SLP Short Term Goal 3 (Week 3): Patient will scan to left visual field during functional tasks in 75% of opportunities with min A multimodal cues. SLP Short Term Goal 4 (Week 3): Patient will demonstrate functional problem solving for basic and familiar tasks with Min A verbal cues. SLP Short Term Goal 5 (Week 3): Patient will utilize speech intelligibility strateies at the sentence level to achieve ~90% intelligibility with supervision A verbal cues.  Skilled Therapeutic Interventions: #1 Skilled ST services focused on cognitive skills. SLP facilitated basic problem solving, error awareness and sustained attention skills in PEG design task, once SLP placed the initial PEG pt was able to continue the alternating pattern with supervision A verbal cues for problem solving and error awareness. SLP attempted second pattern however pt's sustained attention quickly faded as pt's lethargy increased. SLP stopped session and repositioned pt to rest in tilt chair. SLP will make up missed time later in the day. Pt was left in room with daughter, call bell within reach and chair alarm set. ST recommends to continue skilled ST services.   #2 Skilled ST services focused on swallow skills. SLP facilitated PO consumption of dys 3 textures and thin liquids via straw on lunch tray. Pt required mod  A verbal cues fading to min A verbal cues to consume small bites and clear right buccal pocketing, further impacted by impaired dentition (bilateral missing fillings resulting in deep holes.) Pt demonstrated mod I use of finger sweeps at the end of the meal to remove residue stuck to teeth, but was ultimately removed by SLP via oral care. Pt demonstrated increase awareness of bolus, oral residue/pocketing and no overt s/s aspiration noted, however SLP downgraded pt to dys 2 due to increased effort to consume meals due to dentition impairments and reduced PO intake. Pt and pt's daughter, [name] agreed to diet change. Pt requested " I want my foods softer."  All questions answered to satisfaction. Pt was left in room with daughter, call bell within reach and chair alarm set. ST recommends to continue skilled ST services.      Pain Pain Assessment Pain Score: 0-No pain  Therapy/Group: Individual Therapy  Buel Molder  [facility] 05/20/2020, 10:43 AM

## 2020-05-21 ENCOUNTER — Inpatient Hospital Stay (HOSPITAL_COMMUNITY): Payer: 59 | Admitting: Speech Pathology

## 2020-05-21 NOTE — Progress Notes (Signed)
[place] PHYSICAL MEDICINE & REHABILITATION PROGRESS NOTE  Subjective/Complaints: Patient seen laying in bed this morning.  She states she slept well overnight.  She denies complaints.  ROS: + Left groin pain, stable.  Denies CP, SOB, N/V/D  Objective: Vital Signs: Blood pressure (!) 141/80, pulse 69, temperature 98.4 F (36.9 C), resp. rate 18, height 5\' 3"  (1.6 m), weight 67.2 kg, last menstrual period 09/15/2013, SpO2 100 %. No results found. Recent Labs    05/20/20 1050  WBC 8.1  HGB 10.1*  HCT 32.3*  PLT 185   No results for input(s): NA, K, CL, CO2, GLUCOSE, BUN, CREATININE, CALCIUM in the last 72 hours.  Physical Exam: BP (!) 141/80 (BP Location: Right Arm)   Pulse 69   Temp 98.4 F (36.9 C)   Resp 18   Ht 5\' 3"  (1.6 m)   Wt 67.2 kg   LMP 09/15/2013   SpO2 100%   BMI 26.24 kg/m  Constitutional: No distress . Vital signs reviewed. HENT: Normocephalic.  Atraumatic. Eyes: EOMI. No discharge. Cardiovascular: No JVD.  RRR. Respiratory: Normal effort.  No stridor.  Bilateral clear to auscultation. GI: Non-distended.  BS +. Skin: Warm and dry.  Intact. Healed coccyx ulcer. Psych: Somewhat flat. Musc: No edema in extremities.  No tenderness in extremities. Neuro: Alert Left facial weakness Dysarthria, persistent Motor: LUE: Proximally 0/5, handgrip 2+/5 with apraxia, unchanged LLE: 0/5 proximal distal, stable Increased tone noted LUE/LLE   Assessment/Plan: 1. 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  Physiatrist is providing close team supervision and 24 hour management of active medical problems listed below.  Physiatrist and rehab team continue to assess barriers to discharge/monitor patient progress toward functional and medical goals  Care Tool:  Bathing    Body parts bathed by patient: Left arm, Chest, Abdomen, Face   Body parts  bathed by helper: Right arm     Bathing assist Assist Level: Moderate Assistance - Patient 50 - 74%     Upper Body Dressing/Undressing Upper body dressing   What is the patient wearing?: Pull over shirt, Bra    Upper body assist Assist Level: Moderate Assistance - Patient 50 - 74%    Lower Body Dressing/Undressing Lower body dressing      What is the patient wearing?: Incontinence brief, Pants     Lower body assist Assist for lower body dressing: 2 Helpers     Toileting Toileting    Toileting assist Assist for toileting: 2 Helpers     Transfers Chair/bed transfer  Transfers assist     Chair/bed transfer assist level: Maximal Assistance - Patient 25 - 49%     Locomotion Ambulation   Ambulation assist   Ambulation activity did not occur: Safety/medical concerns          Walk 10 feet activity   Assist  Walk 10 feet activity did not occur: Safety/medical concerns        Walk 50 feet activity   Assist Walk 50 feet with 2 turns activity did not occur: Safety/medical concerns         Walk 150 feet activity   Assist Walk 150 feet activity did not occur: Safety/medical concerns         Walk 10 feet on uneven surface  activity   Assist Walk 10 feet on uneven surfaces activity did not occur: Safety/medical concerns         Wheelchair  Assist Will patient use wheelchair at discharge?: Yes Type of Wheelchair: Manual Wheelchair activity did not occur: Safety/medical concerns         Wheelchair 50 feet with 2 turns activity    Assist    Wheelchair 50 feet with 2 turns activity did not occur: Safety/medical concerns       Wheelchair 150 feet activity     Assist Wheelchair 150 feet activity did not occur: Safety/medical concerns          Medical Problem List and Plan: 1.Left side hemiparesis, 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  Continue CIR  WHO/PRAFO nightly 2.  Antithrombotics: -DVT/anticoagulation:Lovenox -antiplatelet therapy: Aspirin 325 mg daily and Plavix 75 mg daily x3 weeks (~9/14) then aspirin alone 3. Pain Management:Tylenol and Advil as needed for groin and knee pain, voltaren gel for knee pain  Warm compress left groin, cont    Hip x-ray negative   Continue muscle rub with benefit 4. Mood:Provide emotional support  Amantadine with breakfast and lunch started on 8/25  Per daughter, somnolence is baseline with patient with inconsistent/erratic sleep/wake patterns, often falling asleep if not engaged in activities prior to admission.   Klonopin 0.25mg  daily prn added for anxiety.  -antipsychotic agents: N/A 5. Neuropsych: This patientis not fully of making decisions on herown behalf. 6. Skin/Wound Care:Routine skin checks.  7. Fluids/Electrolytes/Nutrition:Routine in and outs.  8. Hypertension:   Hydralazine - increased to 100mg  TID on 8/26, Norvasc 10 mg daily.   Imdur 30mg  and Clonidine 0.1mg  BID also added, now with better control.  Added metoprolol 12.5 mg BID, increased to 25 on 9/9  Relatively controlled on 9/11  Monitor with increased mobility 9. Hyperlipidemia. Continue Lipitor 10. AKI on CKD. Normal renal function 2017 but no recent visits, does not have a PCP.   Cr.  1.57 on 9/6, labs ordered for Monday 11. Leukocytosis: Resolved 12. 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 Spasticity- worse in LLE   Increased dantrolene to 100mg  at night, will consider further increase next week  Periodically monitor LFTs 15.  Tooth pain:   Outpatient dentist follow-up.  15. Hypokalemia: normalized  16.  Acute blood loss anemia  Hemoglobin 10.1 on 9/10 17.  Slow transit constipation  Bowel meds increased on 9/9, again on 9/10  Improving  LOS: 18 days A FACE TO FACE EVALUATION WAS PERFORMED  Yailyn Strack Lorie Phenix 05/21/2020, 2:01 PM

## 2020-05-21 NOTE — Plan of Care (Signed)
  Problem: RH BLADDER ELIMINATION Goal: RH STG MANAGE BLADDER WITH ASSISTANCE Description: STG Manage Bladder With Mod I Assistance Outcome: Not Progressing; incontinence at times

## 2020-05-21 NOTE — Progress Notes (Signed)
Speech Language Pathology Daily Session Note  Patient Details  Name: [name] MRN: [id number] Date of Birth: May 23, 1961  Today's Date: 05/21/2020 SLP Individual Time: 9758-8325 SLP Individual Time Calculation (min): 45 min  Short Term Goals: Week 3: SLP Short Term Goal 1 (Week 3): Patient will consume current diet of Dys. 3 textures with thin liquids with minimal overt s/s of aspiration and supervision A vebral cues for use of swallowing compensatory strategies. SLP Short Term Goal 2 (Week 3): Patient will demonstrate sustained attention to functional tasks for ~10 minutes with Min A verbal cues for redirection. SLP Short Term Goal 3 (Week 3): Patient will scan to left visual field during functional tasks in 75% of opportunities with min A multimodal cues. SLP Short Term Goal 4 (Week 3): Patient will demonstrate functional problem solving for basic and familiar tasks with Min A verbal cues. SLP Short Term Goal 5 (Week 3): Patient will utilize speech intelligibility strateies at the sentence level to achieve ~90% intelligibility with supervision A verbal cues.  Skilled Therapeutic Interventions:   Patient seen for skilled ST session focusing on cognitive-linguistic functioning. LPN Kathlee Nations who is getting acclimated to CIR observed session. Patient demonstrated orientation to year and month but not specific day of week or date. She recalled SLP's name (have not worked with this patient in approximately a week) and was able to recall and describe recent past therapy sessions with OT and PT. Patient able to verbalize deficits of left inattention and needing to "focus", but functionally she requires min-mod A cues. Patient required minA cues for left attention when playing card game as she did not initially notice two cards on table in left visual field. Patient required modA cues for locating requested information on left side of hospital meal menu. Patient continues to be hyperactive and  impatient, saying "ok ok", "I got it, I got it" and with monotone voice. When cued to look left at LPN, she did so but required additional cues for eye gaze to focus on LPN. Patient stated she has history of ADHD, which is consistent with her current level of function and appears to be exacerbated by right CVA. She continues to benefit from skilled SLP intervention to maximize cognitive-linguistic, speech and swallow function prior to discharge.  Pain Pain Assessment Pain Scale: 0-10 Pain Score: 0-No pain  Therapy/Group: Individual Therapy  Sonia Baller, MA, CCC-SLP 05/21/20 12:59 PM

## 2020-05-22 ENCOUNTER — Inpatient Hospital Stay (HOSPITAL_COMMUNITY): Payer: 59

## 2020-05-22 ENCOUNTER — Inpatient Hospital Stay (HOSPITAL_COMMUNITY): Payer: 59 | Admitting: Physical Therapy

## 2020-05-22 ENCOUNTER — Inpatient Hospital Stay (HOSPITAL_COMMUNITY): Payer: 59 | Admitting: Speech Pathology

## 2020-05-22 NOTE — Progress Notes (Signed)
Speech Language Pathology Daily Session Note  Patient Details  Name: [name] MRN: [id number] Date of Birth: May 08, 1961  Today's Date: 05/22/2020 SLP Individual Time: 1415-1456 SLP Individual Time Calculation (min): 41 min  Short Term Goals: Week 3: SLP Short Term Goal 1 (Week 3): Patient will consume current diet of Dys. 3 textures with thin liquids with minimal overt s/s of aspiration and supervision A vebral cues for use of swallowing compensatory strategies. SLP Short Term Goal 2 (Week 3): Patient will demonstrate sustained attention to functional tasks for ~10 minutes with Min A verbal cues for redirection. SLP Short Term Goal 3 (Week 3): Patient will scan to left visual field during functional tasks in 75% of opportunities with min A multimodal cues. SLP Short Term Goal 4 (Week 3): Patient will demonstrate functional problem solving for basic and familiar tasks with Min A verbal cues. SLP Short Term Goal 5 (Week 3): Patient will utilize speech intelligibility strateies at the sentence level to achieve ~90% intelligibility with supervision A verbal cues.  Skilled Therapeutic Interventions: Pt was seen for skilled ST targeting cognitive goals. Pt was more distractible today in comparison to last encounter with this ST; somewhat perseverative on clearing her throat, reporting sensation that seems to be consistent with globus sensation. She required Moderate verbal cues for redirection to tasks in 2-3 minute intervals, particularly once family member arrived. Pt also required Moderate verbal and visual cues for identifying and using a problem solving strategy to identify 2-3 differences between photographs. Pt left sitting in chair with alarm set and needs within reach, family still present. Continue per current plan of care.          Pain Pain Assessment Pain Scale: Faces Faces Pain Scale: No hurt  Therapy/Group: Individual Therapy  Arbutus Leas 05/22/2020, 2:57 PM

## 2020-05-22 NOTE — Progress Notes (Signed)
[name] PHYSICAL MEDICINE & REHABILITATION PROGRESS NOTE  Subjective/Complaints: Patient seen laying in bed this morning.  She states he slept well overnight.  She states her groin feels much better with K pad.  ROS: Denies CP, SOB, N/V/D  Objective: Vital Signs: Blood pressure (!) 141/75, pulse 72, temperature 98.9 F (37.2 C), resp. rate 20, height 5\' 3"  (1.6 m), weight 67.2 kg, last menstrual period 09/15/2013, SpO2 100 %. No results found. Recent Labs    05/20/20 1050  WBC 8.1  HGB 10.1*  HCT 32.3*  PLT 185   No results for input(s): NA, K, CL, CO2, GLUCOSE, BUN, CREATININE, CALCIUM in the last 72 hours.  Physical Exam: BP (!) 141/75 (BP Location: Right Arm)   Pulse 72   Temp 98.9 F (37.2 C)   Resp 20   Ht 5\' 3"  (1.6 m)   Wt 67.2 kg   LMP 09/15/2013   SpO2 100%   BMI 26.24 kg/m  Constitutional: No distress . Vital signs reviewed. HENT: Normocephalic.  Atraumatic. Eyes: EOMI. No discharge. Cardiovascular: No JVD.  RRR. Respiratory: Normal effort.  No stridor.  Bilateral clear to auscultation. GI: Non-distended.  BS +. Skin: Warm and dry.  Intact. Psych: Flat.  Perseverative speech. Musc: No edema in extremities.  No tenderness in extremities. Neuro: Alert Left facial weakness Dysarthria, unchanged Motor: LUE: Proximally 0/5, handgrip 2+/5 with apraxia, unchanged LLE: 0/5 proximal distal, unchanged Increased tone noted LUE/LLE   Assessment/Plan: 1. 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  Physiatrist is providing close team supervision and 24 hour management of active medical problems listed below.  Physiatrist and rehab team continue to assess barriers to discharge/monitor patient progress toward functional and medical goals  Care Tool:  Bathing    Body parts bathed by patient: Left arm, Chest, Abdomen, Face   Body parts bathed by  helper: Right arm     Bathing assist Assist Level: Moderate Assistance - Patient 50 - 74%     Upper Body Dressing/Undressing Upper body dressing   What is the patient wearing?: Pull over shirt, Bra    Upper body assist Assist Level: Moderate Assistance - Patient 50 - 74%    Lower Body Dressing/Undressing Lower body dressing      What is the patient wearing?: Incontinence brief, Pants     Lower body assist Assist for lower body dressing: 2 Helpers     Toileting Toileting    Toileting assist Assist for toileting: 2 Helpers     Transfers Chair/bed transfer  Transfers assist     Chair/bed transfer assist level: Maximal Assistance - Patient 25 - 49%     Locomotion Ambulation   Ambulation assist   Ambulation activity did not occur: Safety/medical concerns          Walk 10 feet activity   Assist  Walk 10 feet activity did not occur: Safety/medical concerns        Walk 50 feet activity   Assist Walk 50 feet with 2 turns activity did not occur: Safety/medical concerns         Walk 150 feet activity   Assist Walk 150 feet activity did not occur: Safety/medical concerns         Walk 10 feet on uneven surface  activity   Assist Walk 10 feet on uneven surfaces activity did not occur: Safety/medical concerns         Wheelchair  Assist Will patient use wheelchair at discharge?: Yes Type of Wheelchair: Manual Wheelchair activity did not occur: Safety/medical concerns         Wheelchair 50 feet with 2 turns activity    Assist    Wheelchair 50 feet with 2 turns activity did not occur: Safety/medical concerns       Wheelchair 150 feet activity     Assist Wheelchair 150 feet activity did not occur: Safety/medical concerns          Medical Problem List and Plan: 1.Left side hemiparesis, 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  Continue CIR  WHO/PRAFO nightly 2.  Antithrombotics: -DVT/anticoagulation:Lovenox -antiplatelet therapy: Aspirin 325 mg daily and Plavix 75 mg daily x3 weeks (~9/14) then aspirin alone 3. Pain Management:Tylenol and Advil as needed for groin and knee pain, voltaren gel for knee pain  Warm compress left groin, continue   Hip x-ray negative   Continue muscle rub with benefit 4. Mood:Provide emotional support  Amantadine with breakfast and lunch started on 8/25  Per daughter, somnolence is baseline with patient with inconsistent/erratic sleep/wake patterns, often falling asleep if not engaged in activities prior to admission.   Klonopin 0.25mg  daily prn added for anxiety.  -antipsychotic agents: N/A 5. Neuropsych: This patientis not fully of making decisions on herown behalf. 6. Skin/Wound Care:Routine skin checks.  7. Fluids/Electrolytes/Nutrition:Routine in and outs.  8. Hypertension:   Hydralazine - increased to 100mg  TID on 8/26, Norvasc 10 mg daily.   Imdur 30mg  and Clonidine 0.1mg  BID also added, now with better control.  Added metoprolol 12.5 mg BID, increased to 25 on 9/9  Relatively controlled on 9/12  Monitor with increased mobility 9. Hyperlipidemia. Continue Lipitor 10. AKI on CKD. Normal renal function 2017 but no recent visits, does not have a PCP.   Cr.  1.57 on 9/6, labs ordered for tomorrow 11. Leukocytosis: Resolved 12. 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 Spasticity- worse in LLE   Increased dantrolene to 100mg  at night, will consider further increase next week  Periodically monitor LFTs 15.  Tooth pain:   Outpatient dentist follow-up.  15. Hypokalemia: normalized  16.  Acute blood loss anemia  Hemoglobin 10.1 on 9/10 17.  Slow transit constipation  Bowel meds increased on 9/9, again on 9/10  Improving  LOS: 19 days A FACE TO FACE EVALUATION WAS PERFORMED  Teodor Prater Lorie Phenix 05/22/2020, 8:22 AM

## 2020-05-22 NOTE — Plan of Care (Signed)
  Problem: RH BLADDER ELIMINATION Goal: RH STG MANAGE BLADDER WITH ASSISTANCE Description: STG Manage Bladder With Mod I Assistance Outcome: Not Progressing; incontinence   Problem: RH BOWEL ELIMINATION Goal: RH STG MANAGE BOWEL WITH ASSISTANCE Description: STG Manage Bowel with Mod I Assistance. Outcome: Not Progressing; incontinence

## 2020-05-22 NOTE — Progress Notes (Signed)
Patient still coughing claims throat feels irritated from the potato she ate seems like it is still stuck on her left side of throat. Patient able to swallow good;  no distress noted. Dr. Posey Pronto notified. New orders noted.

## 2020-05-22 NOTE — Progress Notes (Signed)
Dr Posey Pronto notified. No new orders continue to observe patient.

## 2020-05-22 NOTE — Progress Notes (Signed)
Occupational Therapy Session Note  Patient Details  Name: [name] MRN: [id number] Date of Birth: 1961/01/05  Today's Date: 05/22/2020 OT Individual Time: 1305-1330 OT Individual Time Calculation (min): 25 min    Short Term Goals: Week 3:  OT Short Term Goal 1 (Week 3): Pt will be able to self correct posture with mod cues and min A when she leans too far to the L. OT Short Term Goal 2 (Week 3): Pt will don shirt with mod A. OT Short Term Goal 3 (Week 3): pt will complete slide board transfer with max A of 1 to increase potential for toilet transfers.  Skilled Therapeutic Interventions/Progress Updates:    Pt resting in w/c upon arrival with belt alarm activated. OT intervention with focus on attention to L, attention to task, and LUE NMR. Pt engaged in table activities on pt's L with facilitated/HOH LUE functional reach and grasp/release. Pt noted with active hand grasp and extension but unable to engaged voluntarily on command. Pt required max verbal cues to attend to her L and turn her head. Pt required max verbal cues to attend to activities. Pt remained in w/c with all needs within reach and belt alarm activated.   Therapy Documentation Precautions:  Precautions Precautions: Fall, Other (comment) Precaution Comments: L hemi, pushing tendency with RUE to weak L side Restrictions Weight Bearing Restrictions: No  Pain:  Pt denies pain this afternoon   Therapy/Group: Individual Therapy  Leroy Libman 05/22/2020, 2:47 PM

## 2020-05-22 NOTE — Progress Notes (Signed)
Physical Therapy Session Note  Patient Details  Name: [name] MRN: [id number] Date of Birth: 09-20-1960  Today's Date: 05/22/2020 PT Individual Time: 0800-0900; 2703-5009 PT Individual Time Calculation (min): 60 min and 30 min  Short Term Goals: Week 3:  PT Short Term Goal 1 (Week 3): STG = LTG due to ELOS  Skilled Therapeutic Interventions/Progress Updates:    Session 1: Pt received seated in bed eating breakfast with Supervision from NT. This therapist takes over to provide Supervision assist. Pt needs cues to drink water between bites of potatoes and to attend to task. Pt verbally perseverative and keeps stating "these potatoes are dry". Once pt done with breakfast assisted with donning pants at bed level with max A. Rolling to the L with min A and to the R with max A to pull pants up over hips. Supine to sitting EOB with max A for LE management and trunk control. Pt with L lateral lean in sitting. Slide board transfer to the R into TIS chair with max A with 6" step under BLE. Once seated in w/c assisted pt with doffing hospital gown and donning bra and shirt with max A. Pt is setup A for oral hygiene while seated in w/c at sink. Remainder of session focus on sitting balance and reaching outside BOS from w/c level with use of RUE to retrieve targets. Pt requires verbal and tactile cueing to lean outside BOS to retrieve targets. Pt also perseverates on pain in L groin, provided kpad to L groin at end of session for pain management. Pt left semi-reclined in TIS w/c in room with needs in reach, quick release belt and chair alarm in place at end of session.  Session 2: Pt received seated in TIS w/c in room, agreeable to PT session. No complaints of pain. Assisted pt with repositioning in chair as she is leaning heavily to the R, use of mirror for visual feedback and max A to scoot hips over to center patient in w/c. Session focus on scanning to the L with use of Dyn[employer]. Focus on LLQ  and RLQ on Dynavision board. Pt performs 3 rounds of reaching with use of RUE and cues for anterior lean with trunk to reach targets. Pt scores the following reaction times: 6.25 sec L and 3.44 sec R; 5.0 sec L and 4.52 sec R; 4.43 sec L and 2.69 R. Pt consistently requires cues to scan L visual field as well as requires cues to attend to task and exhibits increased time needed to find targets in L visual field. NT requesting pt return to bed at end of session to check brief. Slide board transfer with max A x 1 and min A x 1 due to pt fatigue level. Sit to supine mod A for BLE management. Pt left supine in bed in care of NT at end of session.  Therapy Documentation Precautions:  Precautions Precautions: Fall, Other (comment) Precaution Comments: L hemi, pushing tendency with RUE to weak L side Restrictions Weight Bearing Restrictions: No    Therapy/Group: Individual Therapy   Excell Seltzer, PT, DPT  05/22/2020, 11:48 AM

## 2020-05-22 NOTE — Progress Notes (Signed)
RN was called to room per patient she feels like a piece of the potato this mornng was stuck on her throat left side; RN suctioned mouth nothing was found RN visualized mouth with pen light but none were found. Instructed to patient to drink water and tuck chin; swallow 3x but did not help. Patient claims she feels it and very irritating; encouraged patient to cough but nothing came out per patient she can breath but just irritating. Continued to monitor.

## 2020-05-23 ENCOUNTER — Inpatient Hospital Stay (HOSPITAL_COMMUNITY): Payer: 59

## 2020-05-23 ENCOUNTER — Inpatient Hospital Stay (HOSPITAL_COMMUNITY): Payer: 59 | Admitting: Occupational Therapy

## 2020-05-23 LAB — CBC WITH DIFFERENTIAL/PLATELET
Abs Immature Granulocytes: 0.02 10*3/uL (ref 0.00–0.07)
Basophils Absolute: 0 10*3/uL (ref 0.0–0.1)
Basophils Relative: 0 %
Eosinophils Absolute: 0 10*3/uL (ref 0.0–0.5)
Eosinophils Relative: 0 %
HCT: 32.6 % — ABNORMAL LOW (ref 36.0–46.0)
Hemoglobin: 10.4 g/dL — ABNORMAL LOW (ref 12.0–15.0)
Immature Granulocytes: 0 %
Lymphocytes Relative: 6 %
Lymphs Abs: 0.6 10*3/uL — ABNORMAL LOW (ref 0.7–4.0)
MCH: 27.9 pg (ref 26.0–34.0)
MCHC: 31.9 g/dL (ref 30.0–36.0)
MCV: 87.4 fL (ref 80.0–100.0)
Monocytes Absolute: 0.6 10*3/uL (ref 0.1–1.0)
Monocytes Relative: 6 %
Neutro Abs: 8.1 10*3/uL — ABNORMAL HIGH (ref 1.7–7.7)
Neutrophils Relative %: 88 %
Platelets: 150 10*3/uL (ref 150–400)
RBC: 3.73 MIL/uL — ABNORMAL LOW (ref 3.87–5.11)
RDW: 14 % (ref 11.5–15.5)
WBC: 9.3 10*3/uL (ref 4.0–10.5)
nRBC: 0 % (ref 0.0–0.2)

## 2020-05-23 LAB — BASIC METABOLIC PANEL WITH GFR
Anion gap: 12 (ref 5–15)
BUN: 20 mg/dL (ref 6–20)
CO2: 24 mmol/L (ref 22–32)
Calcium: 9.7 mg/dL (ref 8.9–10.3)
Chloride: 102 mmol/L (ref 98–111)
Creatinine, Ser: 1.7 mg/dL — ABNORMAL HIGH (ref 0.44–1.00)
GFR calc Af Amer: 38 mL/min — ABNORMAL LOW
GFR calc non Af Amer: 33 mL/min — ABNORMAL LOW
Glucose, Bld: 130 mg/dL — ABNORMAL HIGH (ref 70–99)
Potassium: 4 mmol/L (ref 3.5–5.1)
Sodium: 138 mmol/L (ref 135–145)

## 2020-05-23 LAB — URINALYSIS, ROUTINE W REFLEX MICROSCOPIC
Bilirubin Urine: NEGATIVE
Glucose, UA: NEGATIVE mg/dL
Hgb urine dipstick: NEGATIVE
Ketones, ur: NEGATIVE mg/dL
Leukocytes,Ua: NEGATIVE
Nitrite: NEGATIVE
Protein, ur: NEGATIVE mg/dL
Specific Gravity, Urine: 1.011 (ref 1.005–1.030)
pH: 5 (ref 5.0–8.0)

## 2020-05-23 MED ORDER — PANTOPRAZOLE SODIUM 40 MG IV SOLR
40.0000 mg | Freq: Two times a day (BID) | INTRAVENOUS | Status: DC
  Administered 2020-05-23 – 2020-05-24 (×2): 40 mg via INTRAVENOUS
  Filled 2020-05-23 (×2): qty 40

## 2020-05-23 MED ORDER — PANTOPRAZOLE SODIUM 40 MG IV SOLR
40.0000 mg | Freq: Once | INTRAVENOUS | Status: AC
  Administered 2020-05-23: 40 mg via INTRAVENOUS
  Filled 2020-05-23: qty 40

## 2020-05-23 MED ORDER — SODIUM CHLORIDE 0.9 % IV BOLUS
500.0000 mL | Freq: Once | INTRAVENOUS | Status: AC
  Administered 2020-05-23: 500 mL via INTRAVENOUS

## 2020-05-23 MED ORDER — ONDANSETRON HCL 4 MG/2ML IJ SOLN
4.0000 mg | Freq: Three times a day (TID) | INTRAMUSCULAR | Status: DC | PRN
  Administered 2020-05-23: 4 mg via INTRAVENOUS
  Filled 2020-05-23: qty 2

## 2020-05-23 MED ORDER — SUCRALFATE 1 GM/10ML PO SUSP
1.0000 g | Freq: Three times a day (TID) | ORAL | Status: DC
  Administered 2020-05-23 – 2020-06-01 (×36): 1 g via ORAL
  Filled 2020-05-23 (×39): qty 10

## 2020-05-23 NOTE — Progress Notes (Signed)
[name] PHYSICAL MEDICINE & REHABILITATION PROGRESS NOTE  Subjective/Complaints: Patient is nauseous, throwing up after speech trial. As per nurse, threw up three times last night and had black tarry stool. She has mild abdominal pain. CXR reviewed and is stable. Hgb improved to 10.4 and WBC stable. Unable to keep meds down.   ROS: Denies CP, SOB, N/V/D  Objective: Vital Signs: Blood pressure (!) 177/85, pulse 80, temperature 98.4 F (36.9 C), temperature source Oral, resp. rate 18, height 5\' 3"  (1.6 m), weight 67.2 kg, last menstrual period 09/15/2013, SpO2 99 %. DG CHEST PORT 1 VIEW  Result Date: 05/22/2020 CLINICAL DATA:  Persistent cough EXAM: PORTABLE CHEST 1 VIEW COMPARISON:  09/19/2015 FINDINGS: No focal opacity or pleural effusion. Mild cardiomegaly. Aortic atherosclerosis. No pneumothorax. Skin fold artifact over the right lower chest. IMPRESSION: No active disease.  Mild cardiomegaly. Electronically Signed   By: Donavan Foil M.D.   On: 05/22/2020 19:47   Recent Labs    05/20/20 1050 05/23/20 0811  WBC 8.1 9.3  HGB 10.1* 10.4*  HCT 32.3* 32.6*  PLT 185 150   Recent Labs    05/23/20 0811  NA 138  K 4.0  CL 102  CO2 24  GLUCOSE 130*  BUN 20  CREATININE 1.70*  CALCIUM 9.7    Physical Exam: BP (!) 177/85 (BP Location: Right Arm)   Pulse 80   Temp 98.4 F (36.9 C) (Oral)   Resp 18   Ht 5\' 3"  (1.6 m)   Wt 67.2 kg   LMP 09/15/2013   SpO2 99%   BMI 26.24 kg/m  General: Alert and oriented x 3, No apparent distress HEENT: Head is normocephalic, atraumatic, PERRLA, EOMI, sclera anicteric, oral mucosa pink and moist, dentition intact, ext ear canals clear,  Neck: Supple without JVD or lymphadenopathy Heart: Reg rate and rhythm. No murmurs rubs or gallops Chest: CTA bilaterally without wheezes, rales, or rhonchi; no distress Abdomen: Soft, non-tender, non-distended, bowel sounds positive. Extremities: No clubbing, cyanosis, or edema. Pulses are 2+ Skin: Clean  and intact without signs of breakdown Psych: Flat.  Perseverative speech. Musc: No edema in extremities.  No tenderness in extremities. Neuro: Alert Left facial weakness Dysarthria, unchanged Motor: LUE: Proximally 0/5, handgrip 2+/5 with apraxia, unchanged LLE: 0/5 proximal distal, unchanged Stable tone noted LUE/LLE   Assessment/Plan: 1. 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  Physiatrist is providing close team supervision and 24 hour management of active medical problems listed below.  Physiatrist and rehab team continue to assess barriers to discharge/monitor patient progress toward functional and medical goals  Care Tool:  Bathing    Body parts bathed by patient: Left arm, Chest, Abdomen, Face   Body parts bathed by helper: Right arm     Bathing assist Assist Level: Moderate Assistance - Patient 50 - 74%     Upper Body Dressing/Undressing Upper body dressing   What is the patient wearing?: Pull over shirt, Bra    Upper body assist Assist Level: Moderate Assistance - Patient 50 - 74%    Lower Body Dressing/Undressing Lower body dressing      What is the patient wearing?: Incontinence brief, Pants     Lower body assist Assist for lower body dressing: 2 Helpers     Toileting Toileting    Toileting assist Assist for toileting: 2 Helpers     Transfers Chair/bed transfer  Transfers assist  Chair/bed transfer assist level: Maximal Assistance - Patient 25 - 49%     Locomotion Ambulation   Ambulation assist   Ambulation activity did not occur: Safety/medical concerns          Walk 10 feet activity   Assist  Walk 10 feet activity did not occur: Safety/medical concerns        Walk 50 feet activity   Assist Walk 50 feet with 2 turns activity did not occur: Safety/medical concerns         Walk 150 feet  activity   Assist Walk 150 feet activity did not occur: Safety/medical concerns         Walk 10 feet on uneven surface  activity   Assist Walk 10 feet on uneven surfaces activity did not occur: Safety/medical concerns         Wheelchair     Assist Will patient use wheelchair at discharge?: Yes Type of Wheelchair: Manual Wheelchair activity did not occur: Safety/medical concerns         Wheelchair 50 feet with 2 turns activity    Assist    Wheelchair 50 feet with 2 turns activity did not occur: Safety/medical concerns       Wheelchair 150 feet activity     Assist Wheelchair 150 feet activity did not occur: Safety/medical concerns          Medical Problem List and Plan: 1.Left side hemiparesis, 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  Continue CIR  WHO/PRAFO nightly 2. Antithrombotics: -DVT/anticoagulation:Lovenox -antiplatelet therapy: Aspirin 325 mg daily and Plavix 75 mg daily x3 weeks (~9/14) then aspirin alone 3. Pain Management:Tylenol and Advil as needed for groin and knee pain, voltaren gel for knee pain  Warm compress left groin, continue   Hip x-ray negative   Continue muscle rub with benefit 4. Mood:Provide emotional support  Amantadine with breakfast and lunch started on 8/25  Per daughter, somnolence is baseline with patient with inconsistent/erratic sleep/wake patterns, often falling asleep if not engaged in activities prior to admission.   Klonopin 0.25mg  daily prn added for anxiety.  -antipsychotic agents: N/A 5. Neuropsych: This patientis not fully of making decisions on herown behalf. 6. Skin/Wound Care:Routine skin checks.  7. Fluids/Electrolytes/Nutrition:Routine in and outs.  8. Hypertension:   Hydralazine - increased to 100mg  TID on 8/26, Norvasc 10 mg daily.   Imdur 30mg  and Clonidine 0.1mg  BID also added, now with better control.  Added metoprolol 12.5  mg BID, increased to 25 on 9/9  Relatively controlled on 9/12  Monitor with increased mobility 9. Hyperlipidemia. Continue Lipitor 10. AKI on CKD. Normal renal function 2017 but no recent visits, does not have a PCP.   Cr.  1.57 on 9/6, up to 1.7; may be related to dehydration from emesis. 500cc bolus ordered. BMP ordered for tomorrow.  11. Leukocytosis: Resolved 12. 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 Spasticity- worse in LLE   Increased dantrolene to 100mg  at night, will consider further increase next week  Periodically monitor LFTs 15.  Tooth pain:   Outpatient dentist follow-up.  15. Hypokalemia: normalized  16.  Acute blood loss anemia  Hemoglobin 10.1 on 9/10 17.  Slow transit constipation  Bowel meds increased on 9/9, again on 9/10  Improving 18. Nausea/vomitting: Cannot keep meds down currently. Ordered IV Zofran.  19. Abdominal pain: Ordered KUB which showed mild colonic stool 20. GERD: Ordered IV protonix. CXR stable.    LOS: 20  days A FACE TO FACE EVALUATION WAS PERFORMED  Izora Ribas 05/23/2020, 9:36 AM

## 2020-05-23 NOTE — Progress Notes (Addendum)
Occupational Therapy Session Note  Patient Details  Name: [name] MRN: [id number] Date of Birth: Apr 24, 1961  Today's Date: 05/23/2020 OT Individual Time: 6503-5465 OT Individual Time Calculation (min): 53 min    Short Term Goals: Week 3:  OT Short Term Goal 1 (Week 3): Pt will be able to self correct posture with mod cues and min A when she leans too far to the L. OT Short Term Goal 2 (Week 3): Pt will don shirt with mod A. OT Short Term Goal 3 (Week 3): pt will complete slide board transfer with max A of 1 to increase potential for toilet transfers.  Skilled Therapeutic Interventions/Progress Updates:    Pt missed first session due to nausea and vomiting and reporting she feels too sick at this time to do anything.  Extended session in the afternoon to pts tolerance.    Pt significantly leaning over right w/c armrest with inability to right to center upon OT arrival in afternoon session.  Mod assist needed to return to upright position.  Pt reports she was trying to get the towel on the floor.  Educated pt on need to ask for assist for safety next time.  Pt had visibly thrown up and had soiled shirt.  Pt agreeable to washing up and dressing at sinkside.  Pt transported to sink and max assist needed to doff shirt overhead with max VCs for sequencing.  Pt bathed UB with mod assist for RUE and back.  Pt donned bra and gown with max assist and step by step VCs for sequencing and compensatory techniques.    Pt transported w/c to EOB using stedy with +2 assist.  During transfer, pt leaning moderately to left needing max VCs to facilitate righting to center and mod assist to implement upright posture (while +2 operated stedy) likely due to pt distraction and difficulty dividing attention.  Sit to supine with +2 assist.  Pt rolled to left with min assist and to right with max assist for +2 pericare and LB dressing due to soiled brief (of urine).  Pt repositioned towards [facility] with +2 assist.   Call bell in reach, bed alarm on.  Pt missed 37 mins of treatment time.  Therapy Documentation Precautions:  Precautions Precautions: Fall, Other (comment) Precaution Comments: L hemi, pushing tendency with RUE to weak L side Restrictions Weight Bearing Restrictions: No     Therapy/Group: Individual Therapy  Ezekiel Slocumb 05/23/2020, 3:23 PM

## 2020-05-23 NOTE — Progress Notes (Signed)
Physical Therapy Session Note  Patient Details  Name: [name] MRN: [id number] Date of Birth: May 08, 1961  Today's Date: 05/23/2020 PT Individual Time: 0915-1008 PT Individual Time Calculation (min): 53 min   Short Term Goals: Week 1:  PT Short Term Goal 1 (Week 1): Pt will performed supine<>sit with modA PT Short Term Goal 2 (Week 1): Pt will maintain sitting EOB >55min with modA PT Short Term Goal 3 (Week 1): Pt will perform bed<>chair transfers with modA and LRAD PT Short Term Goal 4 (Week 1): Pt will tolerate sitting in w/c for > 2 hours to demonstrate improved activity tolerance  Skilled Therapeutic Interventions/Progress Updates:    Pt received supine in bed, awake and agreeable to PT session. Pt reports both stomach and L hip pain, rates them both as 9/10, RN aware. Pt also reports nausea and has an emesis bag which she was holding to mouth with RUE. Team aware of nausea as well. Pt did not experience any emesis during session however often spitting into the bag. She had saturated brief on arrival, thus required totalA for pericare while she layed supine. Noted black, tarry stool in brief for which NT made aware. MaxA +2 for donning new brief and pants, requiring modA for rolling L and maxA for rolling R. Rest breaks provided frequently during session after positional changes to reduce risk of emesis and assist with control of nausea. Supine<>sit with maxA to EOB, requiring maxA to maintain without BUE support due to posterior and L lateral lean. MaxA for donning her bra and t-shirt while seated EOB with cues for sequencing and technique using RUE to maximize indep. Sliding board transfer with maxA towards her R side from EOB to w/c with cues for RUE placement and head/hip sequencing. Pt therapeutically positioned in chair with pillow support and k-pad to L hip for pain management. Blue emesis bag provided and needs were in reach at end of session. Pt thankful for assistance and  apologetic for being unable to further participate 2/2 nausea.   Therapy Documentation Precautions:  Precautions Precautions: Fall, Other (comment) Precaution Comments: L hemi, pushing tendency with RUE to weak L side Restrictions Weight Bearing Restrictions: No  Therapy/Group: Individual Therapy   Deniss Wormley P Tramya Schoenfelder PT 05/23/2020, 10:13 AM

## 2020-05-23 NOTE — Progress Notes (Signed)
Speech Language Pathology Daily Session Note  Patient Details  Name: [name] MRN: [id number] Date of Birth: 1960/11/21  Today's Date: 05/23/2020 SLP Individual Time: 6546-5035 SLP Individual Time Calculation (min): 54 min  Short Term Goals: Week 3: SLP Short Term Goal 1 (Week 3): Patient will consume current diet of Dys. 3 textures with thin liquids with minimal overt s/s of aspiration and supervision A vebral cues for use of swallowing compensatory strategies. SLP Short Term Goal 2 (Week 3): Patient will demonstrate sustained attention to functional tasks for ~10 minutes with Min A verbal cues for redirection. SLP Short Term Goal 3 (Week 3): Patient will scan to left visual field during functional tasks in 75% of opportunities with min A multimodal cues. SLP Short Term Goal 4 (Week 3): Patient will demonstrate functional problem solving for basic and familiar tasks with Min A verbal cues. SLP Short Term Goal 5 (Week 3): Patient will utilize speech intelligibility strateies at the sentence level to achieve ~90% intelligibility with supervision A verbal cues.  Skilled Therapeutic Interventions:Skilled ST services focused on cognitive skills. Nursing reported vomited at night and chest x-ray was unremarkable. Pt requested to not consume solids for breakfast due to stomach discomfort, but agreeable to consume water and preform oral care. SLP set up oral care with suction toothbrush pt required min A verbal cues to stop preservation and brush top verse bottom teeth. Pt required mod A verbal cues to cease brushing task. SLP facilitated basic problem solving and sustained attention skills in familiar card task identifying 3 differences among 2 cards, pt required mod A verbal cues increasing to max A verbal cues, due to increase distraction from reported globus sensation and continuous spitting into vomit bag. Pt demonstrated increased sustained attention of 10 minutes with min A verbal cues for  redirection in more basic task of identify unsafe situations and providing solutions given picture scenes and pt required min A verbal cues for problem solving. SLP notified nurse and MD of pt expressing feeling light headed and continue stomach discomfort. Pt utilized call bell to request nausea medication with min A verbal cues for problem solving. Pt was left in room with call bell within reach and bed alarm set. SLP recommends to continue skilled services.     Pain Pain Assessment Pain Score: 0-No pain  Therapy/Group: Individual Therapy  Pierce Barocio  [facility] 05/23/2020, 1:57 PM

## 2020-05-23 NOTE — Progress Notes (Signed)
Patient experienced onset of nausea and vomiting x2 this morning following sips of water.  Also, black tarry stool passed during this time.  No further throat irritation as experienced yesterday.  PCXR negative.  Patient reports eating potatoes that caused GI upset.  No further needs or concerns at this time.  Nursing will continue to monitor closely.  Samarth Ogle B. Rulon Eisenmenger, MSN, RN, CNL IP Rehab

## 2020-05-23 NOTE — Progress Notes (Signed)
Keeping patient mostly NPO until examine cause of vomiting. IV access obtained and IV zofran given. Patient still coughing intermittently, concerned about vomiting. Tolerating small sips of water.

## 2020-05-24 ENCOUNTER — Inpatient Hospital Stay (HOSPITAL_COMMUNITY): Payer: 59

## 2020-05-24 ENCOUNTER — Encounter (HOSPITAL_COMMUNITY): Payer: 59 | Admitting: Psychology

## 2020-05-24 LAB — BASIC METABOLIC PANEL
Anion gap: 11 (ref 5–15)
BUN: 20 mg/dL (ref 6–20)
CO2: 25 mmol/L (ref 22–32)
Calcium: 9.4 mg/dL (ref 8.9–10.3)
Chloride: 101 mmol/L (ref 98–111)
Creatinine, Ser: 1.71 mg/dL — ABNORMAL HIGH (ref 0.44–1.00)
GFR calc Af Amer: 38 mL/min — ABNORMAL LOW (ref >60)
GFR calc non Af Amer: 32 mL/min — ABNORMAL LOW (ref >60)
Glucose, Bld: 93 mg/dL (ref 70–99)
Potassium: 3.7 mmol/L (ref 3.5–5.1)
Sodium: 137 mmol/L (ref 135–145)

## 2020-05-24 MED ORDER — BACLOFEN 5 MG HALF TABLET
5.0000 mg | ORAL_TABLET | Freq: Two times a day (BID) | ORAL | Status: DC
  Administered 2020-05-24 – 2020-05-25 (×3): 5 mg via ORAL
  Filled 2020-05-24 (×3): qty 1

## 2020-05-24 MED ORDER — PANTOPRAZOLE SODIUM 40 MG PO PACK
40.0000 mg | PACK | Freq: Two times a day (BID) | ORAL | Status: DC
  Administered 2020-05-24 – 2020-06-01 (×16): 40 mg via ORAL
  Filled 2020-05-24 (×16): qty 20

## 2020-05-24 MED ORDER — PANTOPRAZOLE SODIUM 40 MG PO PACK: 40.0000 mg | PACK | Freq: Every day | ORAL | Status: DC

## 2020-05-24 NOTE — Progress Notes (Signed)
Occupational Therapy Session Note  Patient Details  Name: [name] MRN: [id number] Date of Birth: 09-27-1960  Today's Date: 05/24/2020 OT Individual Time:first session: 1030-1100; second session: 1530-1615 OT Individual Time Calculation (min): first session: 30 min; second session:  45 min   Short Term Goals: Week 3:  OT Short Term Goal 1 (Week 3): Pt will be able to self correct posture with mod cues and min A when she leans too far to the L. OT Short Term Goal 2 (Week 3): Pt will don shirt with mod A. OT Short Term Goal 3 (Week 3): pt will complete slide board transfer with max A of 1 to increase potential for toilet transfers.  Skilled Therapeutic Interventions/Progress Updates:    First session: Pt sitting up in TIS w/c with dtr at side.  Pt reports her stomach is feeling a little better but her groin is still hurting her despite heat currently being applied.  Pt agreeable to OT session for sinkside self care.  Pt doffed shirt and bra with step by step VCs and mod assist. Pt needing VCs to attend to left arm due to neglecting during dressing.   Pt bathed UB with min assist for RUE and to position LUE to wash underneath. Pt donned bra and shirt with mod assist.  Pt needing TCs and VCs to initiate forward leaning as needed.  OT noted left wrist and digit AROM during functional task and assessed further at end of session.  Noted full grasp and digital extension needing increased time to complete after instruction due to delayed motor processing.  Call bell in reach, laptray placed to support LUE and facilitate improved posture.    Second Session: Pt supine in bed, c/o pain in groin despite heating pad application.  Agreeable to OT session.  Pt completed supine to sit with total assist +2.  Pt completed static and dynamic sitting to facilitate self righting and static and dynamic sitting balance.  Pt needing intermittent min to mod assist throughout with brief moments of CGA.  Pt  completed sliding board transfer to w/c with total assist +2.  Pt participated in standing activity at standing frame with functional reaching component to facilitate weight shifting to right.  Pt needing rest break due to pain in left "groin".  Visual scanning and seated weight shifting facilitated during dynavision task using RUE.  Pt needed intermittent min to mod assist to maintain balance.  Pt returned to room and sliding board transfer completed back to bed with total assist +2 including sit to supine.  Pt positioned on left side per her request, pillow placed between BLE to reduce pain, call bell in reach, bed alarm on.  Therapy Documentation Precautions:  Precautions Precautions: Fall, Other (comment) Precaution Comments: L hemi, pushing tendency with RUE to weak L side Restrictions Weight Bearing Restrictions: No   Therapy/Group: Individual Therapy  Ezekiel Slocumb 05/24/2020, 11:18 AM

## 2020-05-24 NOTE — Progress Notes (Signed)
Speech Language Pathology Daily Session Note  Patient Details  Name: [name] MRN: [id number] Date of Birth: 1961/03/23  Today's Date: 05/24/2020 SLP Individual Time: 1200-1230 SLP Individual Time Calculation (min): 30 min  Short Term Goals: Week 3: SLP Short Term Goal 1 (Week 3): Patient will consume current diet of Dys. 3 textures with thin liquids with minimal overt s/s of aspiration and supervision A vebral cues for use of swallowing compensatory strategies. SLP Short Term Goal 2 (Week 3): Patient will demonstrate sustained attention to functional tasks for ~10 minutes with Min A verbal cues for redirection. SLP Short Term Goal 3 (Week 3): Patient will scan to left visual field during functional tasks in 75% of opportunities with min A multimodal cues. SLP Short Term Goal 4 (Week 3): Patient will demonstrate functional problem solving for basic and familiar tasks with Min A verbal cues. SLP Short Term Goal 5 (Week 3): Patient will utilize speech intelligibility strateies at the sentence level to achieve ~90% intelligibility with supervision A verbal cues.  Skilled Therapeutic Interventions: #1 Skilled ST services focused on cognitive skills. Pt requested to transfer to bed due to pain, SLP and NT transferred pt in maximove. SLP facilitated sustained attention and basic problem solving skills utilizing 3 step card sequence, pt demonstrated basic problem solving skills with min A verbal cues, however required max A verbal cues in 1 minute intervals for sustained attention due to lethargy. Pt closed eyes on and off and attempted to remain awake however only able to keep eyes open for 10 seconds at a time. SLP will make up remain time later in the day. Pt was left in room with call bell within reach and bed alarm set. SLP recommends to continue skilled services.  #2 Skilled ST services focused on cognitive skills. Pt was asleep upon entering room but easily awoke and agreeable to  participate in [place] services. A member from the kitchen staff requested to speak with pt and pt appeared to recognize her. Pt engaged in conversation at the word and simple phrase level. SLP clarified preservative and slightly slurred speech with mod A verbal cues. SLP facilitated sustained attention and basic problem solving skills in lego block design task. Pt's alertness quickly faded, appearing to have difficulty keeping eyes open. Pt's problem solving abilities were severely impacted by lethgary even when task was reduced to sorting colors. SLP repositioned pt in bed and adjust environment in room to aid in rest/relaxation. Pt missed 10 minutes of skilled ST services. Pt was left in room with call bell within reach and bed alarm set. SLP recommends to continue skilled services.     Pain Pain Assessment Pain Scale: 0-10 Pain Score: 0-No pain Pain Type: Chronic pain Pain Location: Hip Pain Orientation: Left Pain Descriptors / Indicators: Aching Pain Frequency: Intermittent Pain Onset: On-going Patients Stated Pain Goal: 2 Pain Intervention(s): Medication (See eMAR)  Therapy/Group: Individual Therapy  [name]  [facility] 05/24/2020, 12:33 PM

## 2020-05-24 NOTE — Progress Notes (Addendum)
[facility] PHYSICAL MEDICINE & REHABILITATION PROGRESS NOTE  Subjective/Complaints: Patient seen sitting up in his chair, working with therapies this AM.  She states she slept well overnight.  She notes groin pain, when asked.  Per therapies, lethargy improved.  Discussed patient's perseveration on groin pain with therapies.  ROS: Left groin pain.  Denies CP, SOB, N/V/D  Objective: Vital Signs: Blood pressure 132/79, pulse 64, temperature 98.4 F (36.9 C), temperature source Oral, resp. rate 17, height 5\' 3"  (1.6 m), weight 67.2 kg, last menstrual period 09/15/2013, SpO2 100 %. DG Abd 1 View  Result Date: 05/23/2020 CLINICAL DATA:  Abdominal pain, mid to lower EXAM: ABDOMEN - 1 VIEW COMPARISON:  None. FINDINGS: No dilated small bowel loops. Mild colonic stool. No evidence of pneumatosis or pneumoperitoneum. Surgical clips are seen in the right upper quadrant of the abdomen. Clear lung bases. Lower lumbar spondylosis. IMPRESSION: Nonobstructive bowel gas pattern. Electronically Signed   By: Ilona Sorrel M.D.   On: 05/23/2020 11:22   DG CHEST PORT 1 VIEW  Result Date: 05/22/2020 CLINICAL DATA:  Persistent cough EXAM: PORTABLE CHEST 1 VIEW COMPARISON:  09/19/2015 FINDINGS: No focal opacity or pleural effusion. Mild cardiomegaly. Aortic atherosclerosis. No pneumothorax. Skin fold artifact over the right lower chest. IMPRESSION: No active disease.  Mild cardiomegaly. Electronically Signed   By: Donavan Foil M.D.   On: 05/22/2020 19:47   Recent Labs    05/23/20 0811  WBC 9.3  HGB 10.4*  HCT 32.6*  PLT 150   Recent Labs    05/23/20 0811  NA 138  K 4.0  CL 102  CO2 24  GLUCOSE 130*  BUN 20  CREATININE 1.70*  CALCIUM 9.7    Physical Exam: BP 132/79   Pulse 64   Temp 98.4 F (36.9 C) (Oral)   Resp 17   Ht 5\' 3"  (1.6 m)   Wt 67.2 kg   LMP 09/15/2013   SpO2 100%   BMI 26.24 kg/m  Constitutional: No distress . Vital signs reviewed. HENT: Normocephalic.  Atraumatic. Eyes:  EOMI. No discharge. Cardiovascular: No JVD.  RRR. Respiratory: Normal effort.  No stridor.  Bilateral clear to auscultation. GI: Non-distended.  BS +. Skin: Warm and dry.  Intact. Psych: Flat. Perseverative speech, improving. Normal mood.  Normal behavior. Musc: No edema in extremities.  No tenderness in extremities. Neuro: Alert Left facial weakness Dysarthria, stable Motor: LUE: Proximally 0/5, handgrip 2+/5 with apraxia, stable LLE: 0/5 proximal distal, stable Stable tone noted LUE/LLE  Assessment/Plan: 1. 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  Physiatrist is providing close team supervision and 24 hour management of active medical problems listed below.  Physiatrist and rehab team continue to assess barriers to discharge/monitor patient progress toward functional and medical goals  Care Tool:  Bathing    Body parts bathed by patient: Left arm, Chest, Abdomen, Face, Right arm   Body parts bathed by helper: Left upper leg, Right upper leg, Buttocks, Front perineal area     Bathing assist Assist Level: Moderate Assistance - Patient 50 - 74%     Upper Body Dressing/Undressing Upper body dressing   What is the patient wearing?: Bra, Pull over shirt    Upper body assist Assist Level: Maximal Assistance - Patient 25 - 49%    Lower Body Dressing/Undressing Lower body dressing      What is the patient wearing?: Incontinence brief, Pants  Lower body assist Assist for lower body dressing: 2 Helpers     Toileting Toileting    Toileting assist Assist for toileting: 2 Helpers     Transfers Chair/bed transfer  Transfers assist     Chair/bed transfer assist level: Maximal Assistance - Patient 25 - 49%     Locomotion Ambulation   Ambulation assist   Ambulation activity did not occur: Safety/medical concerns          Walk 10 feet  activity   Assist  Walk 10 feet activity did not occur: Safety/medical concerns        Walk 50 feet activity   Assist Walk 50 feet with 2 turns activity did not occur: Safety/medical concerns         Walk 150 feet activity   Assist Walk 150 feet activity did not occur: Safety/medical concerns         Walk 10 feet on uneven surface  activity   Assist Walk 10 feet on uneven surfaces activity did not occur: Safety/medical concerns         Wheelchair     Assist Will patient use wheelchair at discharge?: Yes Type of Wheelchair: Manual Wheelchair activity did not occur: Safety/medical concerns         Wheelchair 50 feet with 2 turns activity    Assist    Wheelchair 50 feet with 2 turns activity did not occur: Safety/medical concerns       Wheelchair 150 feet activity     Assist Wheelchair 150 feet activity did not occur: Safety/medical concerns          Medical Problem List and Plan: 1.Left side hemiparesis, 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  Continue CIR  WHO/PRAFO nightly 2. Antithrombotics: -DVT/anticoagulation:Lovenox -antiplatelet therapy: Aspirin 325 mg daily and Plavix 75 mg daily x3 weeks then aspirin alone, plan to transition tomorrow 3. Pain Management:Tylenol and Advil as needed for groin and knee pain, voltaren gel for knee pain  Warm compress left groin, continue   Hip x-ray negative   Continue muscle rub with benefit   Encouraged stretching with therapies   See #14 4. Mood:Provide emotional support  Amantadine with breakfast and lunch started on 8/25  Per daughter, somnolence is baseline with patient with inconsistent/erratic sleep/wake patterns, often falling asleep if not engaged in activities prior to admission.   Klonopin 0.25mg  daily prn added for anxiety.  -antipsychotic agents: N/A 5. Neuropsych: This patientis not fully of making decisions on  herown behalf. 6. Skin/Wound Care:Routine skin checks.  7. Fluids/Electrolytes/Nutrition:Routine in and outs.  8. Hypertension:   Hydralazine - increased to 100mg  TID on 8/26, Norvasc 10 mg daily.   Imdur 30mg  and Clonidine 0.1mg  BID also added, now with better control.  Added metoprolol 12.5 mg BID, increased to 25 on 9/9  Controlled on 9/14  Monitor with increased mobility 9. Hyperlipidemia. Continue Lipitor 10. AKI on CKD. Normal renal function 2017 but no recent visits, does not have a PCP.   Cr.  1.71 on 9/14 11. Leukocytosis: Resolved 12. 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 Spasticity- worse in LLE   Dantrolene DC'd on 9/14   Baclofen 5 twice daily started 9/14 15.  Tooth pain:   Outpatient dentist follow-up.  15. Hypokalemia: normalized  16.  Acute blood loss anemia  Hemoglobin 10.4 on 9/13 17.  Slow transit constipation  Bowel meds increased on 9/9, again on 9/10  Improving 18. Nausea/vomitting: Resolved  19. Abdominal pain: Ordered KUB which showed mild colonic stool 20. GERD: Ordered IV protonix. CXR stable.    LOS: 21 days A FACE TO FACE EVALUATION WAS PERFORMED  Naijah Lacek Lorie Phenix 05/24/2020, 10:34 AM

## 2020-05-24 NOTE — Progress Notes (Signed)
Physical Therapy Session Note  Patient Details  Name: [name] MRN: [id number] Date of Birth: 1961/04/20  Today's Date: 05/24/2020 PT Individual Time: 0800-0915 PT Individual Time Calculation (min): 75 min   Short Term Goals: Week 2:  PT Short Term Goal 1 (Week 2): Pt will perform bed mobility with modA PT Short Term Goal 2 (Week 2): Pt will maintain unsupported sitting EOB with modA PT Short Term Goal 3 (Week 2): Pt will perform transfers with LRAD and modA  Skilled Therapeutic Interventions/Progress Updates:    Pt received supine in bed, sleeping but awakens easily to voice. Pt reports improved nausea symptoms compared to yesterday and endorses a well rested night. Noted both LUE hand splint and LLE prafo-boot on at arrival, removed with totalA. Pt saturated brief, required totalA for removing, pt was able to perform pericare with setupA with soaped washcloth. TotalA for placing new brief and maxA for donning scrub bottom pants. Rolling L with min/modA, requiring maxA for rolling R. Supine<>sit with maxA with cues for initiation and compensatory strategies. Fluctuating b/w mod to maxA for sitting balance at EOB. Donned her bra and t-shirt with maxA with cues for step-by-step sequencing. Sliding board transfer with maxA towards her weaker L side from EOB to TIS w/c. Wheeled sinkside where she brushed her teeth with minA for setup. VC for using RUE to remove and replace toothpaste cap and max cues for forward lean to reach sink to rinse her mouth. WC transport with totalA for time management to ortho gym; performed x10 minutes of standing frame with various activities in standing. Mirror placed in front for visual feedback, VC for upright/midline standing with neutral cervical positioning. Performed x5 mini-squat in standing frame however she required max/totalA to achieve upright. While standing, placed numbered colored pads in random order and she was able to place them in numerical order  from #2-#12 with min cues. WC transport back to her room with totalA and remained seated in reclined TIS w/c with daughter, [name], at bedside. Therapist asked daughter to provide door width measurement to ensure w/c will properly fit, she verbalized understanding. Will arrange w/c measurement with [facility] for TIS w/c.   Therapy Documentation Precautions:  Precautions Precautions: Fall, Other (comment) Precaution Comments: L hemi, pushing tendency with RUE to weak L side Restrictions Weight Bearing Restrictions: No  Therapy/Group: Individual Therapy  Jaramiah Bossard P Charlen Bakula PT 05/24/2020, 10:28 AM

## 2020-05-24 NOTE — Consult Note (Signed)
Neuropsychological Consultation   Patient:   [name]   DOB:   06/22/1961  MR Number:  [id number]  Location:  [facility] [address] [facility] [address] [place] [id number] [place] [address] Dept: [place]: [phone]           Date of Service:   05/24/2020  Start Time:   1 PM End Time:   2 PM  Provider/Observer:  Ilean Skill, Psy.D.       Clinical Neuropsychologist       Billing Code/Service: [phone]  Chief Complaint:    [name] is a 59 year old female with history of hypertension, CKD.  Patient lives with her 54 year old son and 2 adult daughters.  Patient presented on 04/27/2020 with left hemiparesis and dysarthria.  CT head showed hyperattenuating focus in the right thalamus age likely reflecting sequela of lacunar type infarct likely remote.  Patient did not receive TPA.  MRI brain showed acute right ACA infarction.  Severe chronic small vessel ischemia was also noted.  There was severe right A2 segment narrowing correlating to acute infarct of the preceding MRI.  Patient has continued with CIR after initial hospitalization working on therapeutic interventions for significant reduction in functional capacity.  The patient has also been dealing with anxiety associated with her recent CVA and likely ongoing issues related to residual motor deficits and expressive language deficits.  Reason for Service:  Patient was referred for neuropsychological consultation due to anxiety and difficulty coping with ongoing language and cognitive deficits and significant motor deficits.  Below is the HPI for the current admission.  [name] a 59 year old right-handed female with history of hypertension/CKDnot on medication. She lives with her 50 year old son and 2 adult daughters. 1 level home 2 steps to entry. Independent prior to admission working at [employer]. Presented 04/27/2020 with left  hemiparesis and dysarthria. Systolic blood pressure 263 diastolic 335. Admission chemistries potassium 2.9 BUN 26 creatinine 1.81 alcohol negative urine drug screen negative. CT of the head showed hyperattenuating focus in the right thalamus age likely reflecting sequela of lacunar type infarct likely remote though in the absence of comparison overall age-indeterminate. Patient did not receive TPA. 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 Severe chronic small vessel ischemia. CT angiogram of head and neck severe right A2 segment narrowing correlating with acute infarct of the preceding MRI. 50% right cavernous ICA stenosis 55% left paraclinoid ICA stenosis. Echocardiogram with ejection fraction 45% grade 2 diastolic dysfunction. Presently on aspirin and Plavix for CVA prophylaxis x3 weeks and aspirin alone. Subcutaneous Lovenox for DVT prophylaxis.Patient did have a mild bump in creatinine 2.21 and received a fluid bolus. Permissive hypertension closely monitoring. Therapy evaluations completed patient was admitted for a comprehensive rehab program.  Current Status:  The patient was initially alert she did have times of drowsiness during the clinical interview.  The patient showed some significant deficits with regard to mental status and overall awareness that appeared to be beyond simple difficulties with expressive language.  There did appear to be some significant executive functioning deficits and her capacity to fully comprehend the complexities about what have happened to her arm mild to moderately impaired.  Patient is learning some new information and does appear to be benefiting from therapeutic interventions and is making progress in recovery.  The patient reports that she has been having some significant anxiety about the significant changes in overall functioning since her stroke.  The patient reports that she  has been worried about her ability to get in and out of her  house when she is discharged.  Patient is struggled with extended hospital stay and while she is cognitively aware of the purpose of her hospitalization it has been very difficult for her to adjust.  The patient still has little over 1 week of rehab expected.  Behavioral Observation: [name]  presents as a 59 y.o.-year-old Right African American Female who appeared her stated age. her dress was Appropriate and she was Well Groomed and her manners were Appropriate to the situation.  her participation was indicative of Appropriate and Inattentive behaviors.  There were any physical disabilities noted.  she displayed an appropriate level of cooperation and motivation.     Interactions:    Active Appropriate, Inattentive and Redirectable  Attention:   abnormal and attention span appeared shorter than expected for age  Memory:   abnormal; remote memory intact, recent memory impaired  Visuo-spatial:  not examined  Speech (Volume):  low  Speech:   garbled; non-fluent aphasia  Thought Process:  Circumstantial and Tangential  Though Content:  WNL; not suicidal and not homicidal  Orientation:   person and place  Judgment:   Poor  Planning:   Poor  Affect:    Anxious  Mood:    Anxious  Insight:   Fair  Intelligence:   normal   Medical History:   Past Medical History:  Diagnosis Date  . Arthritis   . Hypertension   . Hypertension    not taking medications    Psychiatric History:  No prior psychiatric history  Family Med/Psych History:  Family History  Problem Relation Age of Onset  . Cancer Mother   . Hypertension Father   . Heart disease Father   . Heart disease Brother   . Stroke Neg Hx     Impression/DX:  [name] is a 59 year old female with history of hypertension, CKD.  Patient lives with her 55 year old son and 2 adult daughters.  Patient presented on 04/27/2020 with left hemiparesis and dysarthria.  CT head showed hyperattenuating focus in the  right thalamus age likely reflecting sequela of lacunar type infarct likely remote.  Patient did not receive TPA.  MRI brain showed acute right ACA infarction.  Severe chronic small vessel ischemia was also noted.  There was severe right A2 segment narrowing correlating to acute infarct of the preceding MRI.  Patient has continued with CIR after initial hospitalization working on therapeutic interventions for significant reduction in functional capacity.  The patient has also been dealing with anxiety associated with her recent CVA and likely ongoing issues related to residual motor deficits and expressive language deficits.  The patient was initially alert she did have times of drowsiness during the clinical interview.  The patient showed some significant deficits with regard to mental status and overall awareness that appeared to be beyond simple difficulties with expressive language.  There did appear to be some significant executive functioning deficits and her capacity to fully comprehend the complexities about what have happened to her arm mild to moderately impaired.  Patient is learning some new information and does appear to be benefiting from therapeutic interventions and is making progress in recovery.  The patient reports that she has been having some significant anxiety about the significant changes in overall functioning since her stroke.  The patient reports that she has been worried about her ability to get in and out of her house when she is discharged.  Patient  is struggled with extended hospital stay and while she is cognitively aware of the purpose of her hospitalization it has been very difficult for her to adjust.  The patient still has little over 1 week of rehab expected.  Disposition/Plan:  Today we worked on coping and adjustment issues around anxiety.  I will try to follow-up with the patient later this week or first of next week and if her anxiety and stress comes more problematic  we will specifically address that going forward.  Diagnosis:    Cerebrovascular accident (CVA) due to thrombosis of precerebral artery ([place]) - Plan: Ambulatory referral to Neurology  Pain - Plan: DG HIP UNILAT WITH PELVIS 2-3 VIEWS LEFT, DG HIP UNILAT WITH PELVIS 2-3 VIEWS LEFT, CANCELED: DG HIP UNILAT WITH PELVIS MIN 4 VIEWS LEFT, CANCELED: DG HIP UNILAT WITH PELVIS MIN 4 VIEWS LEFT  Cough - Plan: DG CHEST PORT 1 VIEW, DG CHEST PORT 1 VIEW  Abdominal pain - Plan: DG Abd 1 View, DG Abd 1 View         Electronically Signed   _______________________ Ilean Skill, Psy.D.

## 2020-05-25 ENCOUNTER — Inpatient Hospital Stay (HOSPITAL_COMMUNITY): Payer: 59 | Admitting: Speech Pathology

## 2020-05-25 ENCOUNTER — Inpatient Hospital Stay (HOSPITAL_COMMUNITY): Payer: 59 | Admitting: Occupational Therapy

## 2020-05-25 ENCOUNTER — Encounter (HOSPITAL_COMMUNITY): Payer: 59 | Admitting: Occupational Therapy

## 2020-05-25 ENCOUNTER — Ambulatory Visit (HOSPITAL_COMMUNITY): Payer: 59

## 2020-05-25 DIAGNOSIS — R0989 Other specified symptoms and signs involving the circulatory and respiratory systems: Secondary | ICD-10-CM

## 2020-05-25 LAB — URINE CULTURE

## 2020-05-25 MED ORDER — FLUOXETINE HCL 10 MG PO CAPS
10.0000 mg | ORAL_CAPSULE | Freq: Every day | ORAL | Status: DC
  Administered 2020-05-26 – 2020-06-01 (×7): 10 mg via ORAL
  Filled 2020-05-25 (×7): qty 1

## 2020-05-25 MED ORDER — BACLOFEN 5 MG HALF TABLET
5.0000 mg | ORAL_TABLET | Freq: Three times a day (TID) | ORAL | Status: DC
  Administered 2020-05-25 – 2020-05-26 (×3): 5 mg via ORAL
  Filled 2020-05-25 (×3): qty 1

## 2020-05-25 NOTE — Progress Notes (Signed)
Physical Therapy Session Note  Patient Details  Name: [name] MRN: [id number] Date of Birth: 1961/07/10  Today's Date: 05/25/2020 PT Individual Time: 1300-1345 PT Individual Time Calculation (min): 45 min   Short Term Goals: Week 2:  PT Short Term Goal 1 (Week 2): Pt will perform bed mobility with modA PT Short Term Goal 2 (Week 2): Pt will maintain unsupported sitting EOB with modA PT Short Term Goal 3 (Week 2): Pt will perform transfers with LRAD and modA  Skilled Therapeutic Interventions/Progress Updates:    Pt received sitting in w/c, daughter from [place] at bedside, pt agreeable to PT session. She reports L hip pain, team aware, provided rest breaks and repositioning during session for pain management. Focus of session family ed/training. Therapist performed sliding board transfer with maxA from w/c to EOB. During transfer, noted pt had a BM. Performed sit>supine with maxA for BLE management and NT called in to assist with pericare which was done with totalA. Pt performed multiple bouts of rolling L<>R during this which she required min/modA rolling towards L and maxA rolling R. Demonstrated and explained manual Hoyer lift functions to daughter including valve for lowering, hand pump for raising, brake locking, and lever for widening legs of lift. Verbalized instructions with daughter showing technique for placing hoyer lift pad via rolling technique in bed and using lift to transfer from bed to TIS w/c. Daughter assisted with moving hoyer lift around the room to feel comfortable with transitions. She also assisted with releasing valve slowly to safely lower pt in her w/c. Pt required maxA/totalA for repositioning in chair for comfort and this was explained to daughter as well. Daughter will benefit from further family ed training for hoyer lift to get her more hands on involvement. Pt ended session seated in w/c, needs in reach, daughter at bedside.  Therapy  Documentation Precautions:  Precautions Precautions: Fall, Other (comment) Precaution Comments: L hemi, pushing tendency with RUE to weak L side Restrictions Weight Bearing Restrictions: No Pain: Pain Assessment Pain Score: 2   Therapy/Group: Individual Therapy  Tere Mcconaughey P Melizza Kanode PT 05/25/2020, 3:56 PM

## 2020-05-25 NOTE — Progress Notes (Signed)
Occupational Therapy Session Note  Patient Details  Name: [name] MRN: [id number] Date of Birth: [birth year]  Today's Date: 05/25/2020 OT Individual Time: first session: 1005-1100; second session: 1408-1450 OT Individual Time Calculation (min): first session: 55 min ; second session: 42 min   Short Term Goals: Week 3:  OT Short Term Goal 1 (Week 3): Pt will be able to self correct posture with mod cues and min A when she leans too far to the L. OT Short Term Goal 2 (Week 3): Pt will don shirt with mod A. OT Short Term Goal 3 (Week 3): pt will complete slide board transfer with max A of 1 to increase potential for toilet transfers.  Skilled Therapeutic Interventions/Progress Updates:    First session: Pt supine in bed, c/o pain in groin during movement, reduced but present at rest.  Agreeable to OT session.  Pt completed bed level bathing and dressing needing total assist with min assist for rolling left and max assist rolling right.  Supine to sit needing total assist +2.  Sliding board transfer needing max VCs and TCs to facilitate forward leaning and total assist +2.  Pt repetitively stating "Ok" prior to processing given commands.  Pt completed sinkside bathing and dressing sitting in w/c needing max assist to doff/donn bra and shirt overhead despite step by step VCs provided for compensatory technique.  Pt having difficulty attending to left side of body with impaired sustained attention.  Pt needing mod assist to bathe UB including RUE, manual assist to postion LUE for washing, and for back.  Pt needing VCs to initiate forward leaning in order for OT to bathe back.  Pt in w/c, call bell in reach, laptray placed.  Second session:  Pt sitting up in w/c with dtr [name] in room for family education participation.  Pt reports she has reviewed a lot of self care techniques including sinkside bathing and dressing and bed level LB bathing and dressing and toileting over the weekend with OT,  however she still feels nervous about mechanical hoyer lift transferring.  Educated dtr on [employer] transfer with pt w/c<>bed.  Dtr completed both hoyer transfers needing min VCs for positioning and safety.  Educated pts dtr regarding pts current self care status during bathing, dressing, toileting, and oral hygiene.  Educated pts dtr regarding left resting hand orthosis wear schedule, precautions, and donning doffing technique.  Dtr donned/doffed RHS with occasional VC needed. Pts dtr may benefit from additional caregiver training to increase confidence, however demonstrated and reported good understanding of all training provided this session. Pt sitting up in w/c, call bell in reach, seat belt alarm on.    Therapy Documentation Precautions:  Precautions Precautions: Fall, Other (comment) Precaution Comments: L hemi, pushing tendency with RUE to weak L side Restrictions Weight Bearing Restrictions: No   Therapy/Group: Individual Therapy  Ezekiel Slocumb 05/25/2020, 11:49 AM

## 2020-05-25 NOTE — Plan of Care (Signed)
  Problem: RH Attention Goal: LTG Patient will demonstrate this level of attention during functional activites (SLP) Description: LTG:  Patient will will demonstrate this level of attention during functional activites (SLP) Flowsheets (Taken 05/25/2020 1211) LTG: Patient will demonstrate this level of attention during cognitive/linguistic activities with assistance of (SLP): Minimal Assistance - Patient > 75%  Sonia Baller, MA, CCC-SLP Speech Therapy

## 2020-05-25 NOTE — Progress Notes (Addendum)
[facility] PHYSICAL MEDICINE & REHABILITATION PROGRESS NOTE  Subjective/Complaints: Patient seen sitting up in bed this morning working with therapies.  She states she did not sleep well overnight due to groin pain.  She states she did perform stretches yesterday with therapies.  ROS: Denies CP, SOB, N/V/D  Objective: Vital Signs: Blood pressure (!) 169/86, pulse 92, temperature 98.7 F (37.1 C), temperature source Oral, resp. rate 18, height 5\' 3"  (1.6 m), weight 67.2 kg, last menstrual period 09/15/2013, SpO2 99 %. DG Abd 1 View  Result Date: 05/23/2020 CLINICAL DATA:  Abdominal pain, mid to lower EXAM: ABDOMEN - 1 VIEW COMPARISON:  None. FINDINGS: No dilated small bowel loops. Mild colonic stool. No evidence of pneumatosis or pneumoperitoneum. Surgical clips are seen in the right upper quadrant of the abdomen. Clear lung bases. Lower lumbar spondylosis. IMPRESSION: Nonobstructive bowel gas pattern. Electronically Signed   By: Ilona Sorrel M.D.   On: 05/23/2020 11:22   Recent Labs    05/23/20 0811  WBC 9.3  HGB 10.4*  HCT 32.6*  PLT 150   Recent Labs    05/23/20 0811 05/24/20 0823  NA 138 137  K 4.0 3.7  CL 102 101  CO2 24 25  GLUCOSE 130* 93  BUN 20 20  CREATININE 1.70* 1.71*  CALCIUM 9.7 9.4    Physical Exam: BP (!) 169/86 (BP Location: Right Arm)   Pulse 92   Temp 98.7 F (37.1 C) (Oral)   Resp 18   Ht 5\' 3"  (1.6 m)   Wt 67.2 kg   LMP 09/15/2013   SpO2 99%   BMI 26.24 kg/m  Constitutional: No distress . Vital signs reviewed. HENT: Normocephalic.  Atraumatic. Eyes: EOMI. No discharge. Cardiovascular: No JVD.  RRR. Respiratory: Normal effort.  No stridor.  Bilateral clear to auscultation. GI: Non-distended.  BS +. Skin: Warm and dry.  Intact. Psych: Flat.  Perseverative speech, improving. Musc: Left groin tenderness. Neuro: Alert Left facial weakness Dysarthria, unchanged Motor: LUE: Proximally 0/5, handgrip 2+/5 with apraxia, stable LLE: 0/5 proximal  distal, stable Increased tone noted LUE/LLE  Assessment/Plan: 1. 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  Physiatrist is providing close team supervision and 24 hour management of active medical problems listed below.  Physiatrist and rehab team continue to assess barriers to discharge/monitor patient progress toward functional and medical goals  Care Tool:  Bathing    Body parts bathed by patient: Left arm, Chest, Abdomen, Face, Right arm   Body parts bathed by helper: Left upper leg, Right upper leg, Buttocks, Front perineal area     Bathing assist Assist Level: Moderate Assistance - Patient 50 - 74%     Upper Body Dressing/Undressing Upper body dressing   What is the patient wearing?: Bra, Pull over shirt    Upper body assist Assist Level: Maximal Assistance - Patient 25 - 49%    Lower Body Dressing/Undressing Lower body dressing      What is the patient wearing?: Incontinence brief, Pants     Lower body assist Assist for lower body dressing: 2 Helpers     Toileting Toileting    Toileting assist Assist for toileting: 2 Helpers     Transfers Chair/bed transfer  Transfers assist     Chair/bed transfer assist level: Maximal Assistance - Patient 25 - 49%     Locomotion Ambulation   Ambulation assist   Ambulation activity did not occur:  Safety/medical concerns          Walk 10 feet activity   Assist  Walk 10 feet activity did not occur: Safety/medical concerns        Walk 50 feet activity   Assist Walk 50 feet with 2 turns activity did not occur: Safety/medical concerns         Walk 150 feet activity   Assist Walk 150 feet activity did not occur: Safety/medical concerns         Walk 10 feet on uneven surface  activity   Assist Walk 10 feet on uneven surfaces activity did not occur: Safety/medical  concerns         Wheelchair     Assist Will patient use wheelchair at discharge?: Yes Type of Wheelchair: Manual Wheelchair activity did not occur: Safety/medical concerns         Wheelchair 50 feet with 2 turns activity    Assist    Wheelchair 50 feet with 2 turns activity did not occur: Safety/medical concerns       Wheelchair 150 feet activity     Assist Wheelchair 150 feet activity did not occur: Safety/medical concerns          Medical Problem List and Plan: 1.Left side hemiparesis, 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  Continue CIR  WHO/PRAFO nightly  Fluoxetine ordered on 9/16  Team conference today to discuss current and goals and coordination of care, home and environmental barriers, and discharge planning with nursing, case manager, and therapies. Please see conference note from today as well.  2. Antithrombotics: -DVT/anticoagulation:Lovenox -antiplatelet therapy: Aspirin 325 mg daily and Plavix 75 mg daily x3 weeks then aspirin alone, Plavix DC'd on 9/16 3. Pain Management:Tylenol and Advil as needed for groin and knee pain, voltaren gel for knee pain  Warm compress left groin, continue   Hip x-ray negative   Continue muscle rub with benefit   Encouraged aggressive stretching with therapies   See #14 4. Mood:Provide emotional support  Amantadine with breakfast and lunch started on 8/25  Per daughter, somnolence is baseline with patient with inconsistent/erratic sleep/wake patterns, often falling asleep if not engaged in activities prior to admission.   Klonopin 0.25mg  daily prn added for anxiety.  -antipsychotic agents: N/A 5. Neuropsych: This patientis not fully of making decisions on herown behalf. 6. Skin/Wound Care:Routine skin checks.  7. Fluids/Electrolytes/Nutrition:Routine in and outs.  8. Hypertension:   Hydralazine - increased to 100mg  TID on 8/26, Norvasc 10 mg  daily.   Imdur 30mg  and Clonidine 0.1mg  BID also added, now with better control.  Added metoprolol 12.5 mg BID, increased to 25 on 9/9  Labile on 9/15, monitor for trend  Monitor with increased mobility 9. Hyperlipidemia. Continue Lipitor 10. AKI on CKD. Normal renal function 2017 but no recent visits, does not have a PCP.   Cr.  1.71 on 9/14, plan to order labs for the end of the week 11. Leukocytosis: Resolved 12. 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  Continue to advance as tolerated 14. Spasticity- worse in LLE   Dantrolene DC'd on 9/14   Baclofen 5 twice daily started 9/14, increased to 3 times daily on 9/15 15.  Tooth pain:   Outpatient dentist follow-up.  15. Hypokalemia: normalized  16.  Acute blood loss anemia  Hemoglobin 10.4 on 9/13 17.  Slow transit constipation  Bowel meds increased on 9/9, again on  9/10  Improving 18. Nausea/vomitting: Resolved 19. Abdominal pain: Ordered KUB which showed mild colonic stool  Resolved 20. GERD: Ordered IV protonix. CXR stable.    LOS: 22 days A FACE TO FACE EVALUATION WAS PERFORMED  [employer] Veasey Lorie Phenix 05/25/2020, 9:14 AM

## 2020-05-25 NOTE — Plan of Care (Signed)
Problem: RH Tub/Shower Transfers Goal: LTG Patient will perform tub/shower transfers w/assist (OT) Description: LTG: Patient will perform tub/shower transfers with assist, with/without cues using equipment (OT) Outcome: Not Applicable Note: Due to safety concerns, pt only appropriate for w/c sinkside and bed level bathing currently.   Problem: RH Balance Goal: LTG: Patient will maintain dynamic sitting balance (OT) Description: LTG:  Patient will maintain dynamic sitting balance with assistance during activities of daily living (OT) Flowsheets (Taken 05/25/2020 1224) LTG: Pt will maintain dynamic sitting balance during ADLs with: (downgraded goal due to slow progress) Moderate Assistance - Patient 50 - 74% Note: downgraded goal due to slow progress. Goal: LTG Patient will maintain dynamic standing with ADLs (OT) Description: LTG:  Patient will maintain dynamic standing balance with assist during activities of daily living (OT)  Flowsheets (Taken 05/25/2020 1224) LTG: Pt will maintain dynamic standing balance during ADLs with: (downgraded goal due to slow progress) 2 Helpers Note: downgraded goal due to slow progress   Problem: Sit to Stand Goal: LTG:  Patient will perform sit to stand in prep for activites of daily living with assistance level (OT) Description: LTG:  Patient will perform sit to stand in prep for activites of daily living with assistance level (OT) Flowsheets (Taken 05/25/2020 1224) LTG: PT will perform sit to stand in prep for activites of daily living with assistance level: (downgraded goal due to slow progress) 2 Helpers Note: downgraded goal due to slow progress   Problem: RH Bathing Goal: LTG Patient will bathe all body parts with assist levels (OT) Description: LTG: Patient will bathe all body parts with assist levels (OT) Flowsheets (Taken 05/25/2020 1224) LTG: Pt will perform bathing with assistance level/cueing: (downgraded goal due to slow progress) Moderate  Assistance - Patient 50 - 74% LTG: Position pt will perform bathing:  At sink  Supine in bed Note: downgraded goal due to slow progress   Problem: RH Dressing Goal: LTG Patient will perform upper body dressing (OT) Description: LTG Patient will perform upper body dressing with assist, with/without cues (OT). Flowsheets (Taken 05/25/2020 1224) LTG: Pt will perform upper body dressing with assistance level of: (downgraded goal due to slow progress) Moderate Assistance - Patient 50 - 74% Note: downgraded goal due to slow progress Goal: LTG Patient will perform lower body dressing w/assist (OT) Description: LTG: Patient will perform lower body dressing with assist, with/without cues in positioning using equipment (OT) Flowsheets (Taken 05/25/2020 1224) LTG: Pt will perform lower body dressing with assistance level of: Total Assistance - Patient < 25%   Problem: RH Toileting Goal: LTG Patient will perform toileting task (3/3 steps) with assistance level (OT) Description: LTG: Patient will perform toileting task (3/3 steps) with assistance level (OT)  Flowsheets (Taken 05/25/2020 1224) LTG: Pt will perform toileting task (3/3 steps) with assistance level: (downgraded goal due to slow progress) Total Assistance - Patient < 25% Note: downgraded goal due to slow progress   Problem: RH Functional Use of Upper Extremity Goal: LTG Patient will use RT/LT upper extremity as a (OT) Description: LTG: Patient will use right/left upper extremity as a stabilizer/gross assist/diminished/nondominant/dominant level with assist, with/without cues during functional activity (OT) Flowsheets (Taken 05/25/2020 1224) LTG: Use of upper extremity in functional activities: (downgraded goal due to slow progress) Other (Comment) LTG: Pt will use upper extremity in functional activity with assistance level of: (pt will protect and position RUE using LUE with mod VCs.) Dependent - Patient equals 0% Note: downgraded goal due  to slow progress  Problem: RH Toilet Transfers Goal: LTG Patient will perform toilet transfers w/assist (OT) Description: LTG: Patient will perform toilet transfers with assist, with/without cues using equipment (OT) Flowsheets (Taken 05/25/2020 1224) LTG: Pt will perform toilet transfers with assistance level of: (pts caregivers will be independent with hoyer transfer to [facility]) Dependent - Patient equals 0% Note: downgraded goal due to slow progress   Problem: RH Attention Goal: LTG Patient will demonstrate this level of attention during functional activites (OT) Description: LTG:  Patient will demonstrate this level of attention during functional activites  (OT) Flowsheets (Taken 05/25/2020 1224) Patient will demonstrate this level of attention during functional activites: Sustained Patient will demonstrate above attention level in the following environment: Home LTG: Patient will demonstrate this level of attention during functional activites (OT): (downgraded goal due to slow progress) Moderate Assistance - Patient 50 - 74% Note: downgraded goal due to slow progress   Problem: RH Balance Goal: LTG Patient will maintain dynamic standing with ADLs (OT) Description: LTG:  Patient will maintain dynamic standing balance with assist during activities of daily living (OT)  Flowsheets (Taken 05/25/2020 1224) LTG: Pt will maintain dynamic standing balance during ADLs with: (downgraded goal due to slow progress) 2 Helpers Note: downgraded goal due to slow progress   Problem: Sit to Stand Goal: LTG:  Patient will perform sit to stand in prep for activites of daily living with assistance level (OT) Description: LTG:  Patient will perform sit to stand in prep for activites of daily living with assistance level (OT) Flowsheets (Taken 05/25/2020 1224) LTG: PT will perform sit to stand in prep for activites of daily living with assistance level: (downgraded goal due to slow progress) 2 Helpers Note:  downgraded goal due to slow progress   Problem: RH Bathing Goal: LTG Patient will bathe all body parts with assist levels (OT) Description: LTG: Patient will bathe all body parts with assist levels (OT) Flowsheets (Taken 05/25/2020 1224) LTG: Pt will perform bathing with assistance level/cueing: (downgraded goal due to slow progress) Moderate Assistance - Patient 50 - 74% LTG: Position pt will perform bathing:  At sink  Supine in bed Note: downgraded goal due to slow progress   Problem: RH Dressing Goal: LTG Patient will perform upper body dressing (OT) Description: LTG Patient will perform upper body dressing with assist, with/without cues (OT). Flowsheets (Taken 05/25/2020 1224) LTG: Pt will perform upper body dressing with assistance level of: (downgraded goal due to slow progress) Moderate Assistance - Patient 50 - 74% Note: downgraded goal due to slow progress   Problem: RH Dressing Goal: LTG Patient will perform lower body dressing w/assist (OT) Description: LTG: Patient will perform lower body dressing with assist, with/without cues in positioning using equipment (OT) Flowsheets (Taken 05/25/2020 1224) LTG: Pt will perform lower body dressing with assistance level of: Total Assistance - Patient < 25%   Problem: RH Toileting Goal: LTG Patient will perform toileting task (3/3 steps) with assistance level (OT) Description: LTG: Patient will perform toileting task (3/3 steps) with assistance level (OT)  Flowsheets (Taken 05/25/2020 1224) LTG: Pt will perform toileting task (3/3 steps) with assistance level: (downgraded goal due to slow progress) Total Assistance - Patient < 25% Note: downgraded goal due to slow progress   Problem: RH Functional Use of Upper Extremity Goal: LTG Patient will use RT/LT upper extremity as a (OT) Description: LTG: Patient will use right/left upper extremity as a stabilizer/gross assist/diminished/nondominant/dominant level with assist, with/without cues  during functional activity (OT) Flowsheets (Taken 05/25/2020 1224)  LTG: Use of upper extremity in functional activities: (downgraded goal due to slow progress) Other (Comment) LTG: Pt will use upper extremity in functional activity with assistance level of: (pt will protect and position RUE using LUE with mod VCs.) Dependent - Patient equals 0% Note: downgraded goal due to slow progress   Problem: RH Toilet Transfers Goal: LTG Patient will perform toilet transfers w/assist (OT) Description: LTG: Patient will perform toilet transfers with assist, with/without cues using equipment (OT) Flowsheets (Taken 05/25/2020 1224) LTG: Pt will perform toilet transfers with assistance level of: (pts caregivers will be independent with hoyer transfer to [facility]) Dependent - Patient equals 0% Note: downgraded goal due to slow progress   Problem: RH Attention Goal: LTG Patient will demonstrate this level of attention during functional activites (OT) Description: LTG:  Patient will demonstrate this level of attention during functional activites  (OT) Flowsheets (Taken 05/25/2020 1224) Patient will demonstrate this level of attention during functional activites: Sustained Patient will demonstrate above attention level in the following environment: Home LTG: Patient will demonstrate this level of attention during functional activites (OT): (downgraded goal due to slow progress) Moderate Assistance - Patient 50 - 74% Note: downgraded goal due to slow progress

## 2020-05-25 NOTE — Patient Care Conference (Signed)
Inpatient RehabilitationTeam Conference and Plan of Care Update Date: 05/25/2020   Time: 11:17 AM    Patient Name: [name]      Medical Record Number: [id number]  Date of Birth: 1960/11/28 Sex: Female         Room/Bed: 4M07C/4M07C-01 Payor Info: Payor: [employer]  / Plan: [employer] / Product Type: *No Product type* /    Admit Date/Time:  05/03/2020  3:56 PM  Primary Diagnosis:  CVA (cerebral vascular accident) [facility])  Hospital Problems: Principal Problem:   CVA (cerebral vascular accident) ([place]) Active Problems:   Pressure injury of skin   Thrombocytosis (HCC)   Leukocytosis   AKI (acute kidney injury) ([place])   Benign essential HTN   Lethargy   Dysphagia, post-stroke   Spastic hemiparesis (HCC)   Pain   Slow transit constipation   Acute blood loss anemia   Left inguinal pain   Labile blood pressure    Expected Discharge Date: Expected Discharge Date: 06/01/20  Team Members Present: Physician leading conference: Dr. Delice Lesch Care Coodinator Present: Dorien Chihuahua, RN, BSN, CRRN;Other (comment) Jacqlyn Larsen [place], SW) Nurse Present: Other (comment) Abigail Miyamoto, RN) PT Present: Other (comment) (Christain Manhard, PT) OT Present: Leretha Pol, OT SLP Present: Jettie Booze, CF-SLP PPS Coordinator present : Gunnar Fusi, SLP     Current Status/Progress Goal Weekly Team Focus  Bowel/Bladder   incontinent of bowel and bladder; LBM 9/15  To become continent of bowel and bladder  assess q shift/prn   Swallow/Nutrition/ Hydration   On clear liquids since 9/13 due to nausea and vomitting; had been downgraded to Dys 2 solids by SLP due to impaired dentition, min A swallow  supervision  Tolerating diet, use of swallow strategies, and education   ADL's   total assist + 2 functional transfers, min/mod assist sitting balance, mod assist UB self care, total assist +2 LB self care  mod assist; plan to downgrade LTGs today  ADL training, sitting balance, weight shifting,   self righting, left side awareness, compensatory training   Mobility   max/totalA  modA goals  TIS w/c eval with jason from stalls on Monday 9/20, sitting balance. Family ed training   Communication   Mod-Min A  supervision  reduce preservation, clear communication and education   Safety/Cognition/ Behavioral Observations  Mod-Min A  supervision  basic problem solving, sustained attention, left awareness and education   Pain   c/o L groin and hip pain  pain < 3  assess q shift/prn; medicate as needed; k-pad   Skin   skin intact  skin remain free of further breakdown/infection  assess skin q shift/prn     Discharge Planning:  Family committed to taking pt home [place] aware of amount of care at discharge, will need pt's other daughter who come the weekend to come in and begin family education   Team Discussion: MD reports no medical change except tweak medications. Patient demonstrated improvement with orientation and memory. Continue to note echolalia and left neglect however Nausea and Vomiting better. Knee and hip pain is controlled with interventions. Remains incontinent of B+B.  Patient on target to meet rehab goals: Yes, note small steady gains  *See Care Plan and progress notes for long and short-term goals.   Revisions to Treatment Plan:  Tilt in space wheelchair eval scheduled Teaching Needs: Scanning to left, compensatory measures, transfers, toileting, medications, etc.  Current Barriers to Discharge: Decreased caregiver support and Home enviroment access/layout, incontinence, poor attention span (Hx of ADHD)  and steps to entry of the home.  Possible Resolutions to Barriers: Family education with daughter from [place] Recommend ramp installation for entry of home     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.  Barriers to Discharge: Decreased family/caregiver support;Medical  stability;Medication compliance;Behavior;Incontinence   Possible Resolutions to [employer] Focus: Therapies, optimize spasticity meds, continue wound care, advance diet as tolerated, follow labs, prozaac initiated, advance diet.   Continued Need for Acute Rehabilitation Level of Care: The patient requires daily medical management by a physician with specialized training in physical medicine and rehabilitation for the following reasons: Direction of a multidisciplinary physical rehabilitation program to maximize functional independence : Yes Medical management of patient stability for increased activity during participation in an intensive rehabilitation regime.: Yes Analysis of laboratory values and/or radiology reports with any subsequent need for medication adjustment and/or medical intervention. : Yes   I attest that I was present, lead the team conference, and concur with the assessment and plan of the team.   Dorien Chihuahua B 05/25/2020, 1:15 PM

## 2020-05-25 NOTE — Progress Notes (Signed)
Speech Language Pathology Weekly Progress and Session Note  Patient Details  Name: MACKENZE GRANDISON MRN: [id number] Date of Birth: 11-24-1960  Beginning of progress report period: May 18, 2020 End of progress report period: May 25, 2020  Today's Date: 05/25/2020 SLP Individual Time: 0810-0900 SLP Individual Time Calculation (min): 50 min  Short Term Goals: Week 3: SLP Short Term Goal 1 (Week 3): Patient will consume current diet of Dys. 3 textures with thin liquids with minimal overt s/s of aspiration and supervision A vebral cues for use of swallowing compensatory strategies. SLP Short Term Goal 1 - Progress (Week 3): Not met SLP Short Term Goal 2 (Week 3): Patient will demonstrate sustained attention to functional tasks for ~10 minutes with Min A verbal cues for redirection. SLP Short Term Goal 2 - Progress (Week 3): Not met SLP Short Term Goal 3 (Week 3): Patient will scan to left visual field during functional tasks in 75% of opportunities with min A multimodal cues. SLP Short Term Goal 3 - Progress (Week 3): Not met SLP Short Term Goal 4 (Week 3): Patient will demonstrate functional problem solving for basic and familiar tasks with Min A verbal cues. SLP Short Term Goal 4 - Progress (Week 3): Not met SLP Short Term Goal 5 (Week 3): Patient will utilize speech intelligibility strateies at the sentence level to achieve ~90% intelligibility with supervision A verbal cues. SLP Short Term Goal 5 - Progress (Week 3): Met    New Short Term Goals: Week 4: SLP Short Term Goal 1 (Week 4): STG=LTG's (ELOS of discharge on 9/22)  Weekly Progress Updates: Patient has made some progress overall but met only 1/5 STG's as she continues to require modA at times mod-maxA for functional awareness and use of strategies as well as attention. Patient's diet was downgraded from Dys 3 to Dys 2 due to dental issues which existed prior to this hospital admission. Patient will continue to benefit  from skilled SLP intervention to maximize cognitive-linguistic, swallow and speech goals prior to discharge.     Intensity: Minumum of 1-2 x/day, 30 to 90 minutes Frequency: 3 to 5 out of 7 days Duration/Length of Stay: 9/22 Treatment/Interventions: Cognitive remediation/compensation;Cueing hierarchy;Dysphagia/aspiration precaution training;Functional tasks;Internal/external aids;Speech/Language facilitation;Patient/family education   Daily Session  Skilled Therapeutic Interventions: Patient was very inattentive today, complaining of left groin pain (this has been ongoing in past few days) and required maxA cues to direct and redirect to basic level cognitive tasks. When presented with medication label and instructed to find name of medication, she looked at it briefly, then set it down and didn't' say anything. She did tell SLP that "I can't see it" when asked why she stopped. She exhibited this behavior with majority of tasks presented. When given verbal instructions, she would quickly respond, "I got it, I got it" or "thank you, thank you", often interrupting SLP and/or not waiting for him to finish talking. Plan to downgrade patient's cognitive goals due to ELOS and progress.   General    Pain Pain Assessment Pain Scale: 0-10 Pain Score: 3  Pain Type: Chronic pain Pain Location: Groin Pain Orientation: Left Pain Descriptors / Indicators: Aching Pain Frequency: Constant Pain Onset: On-going Patients Stated Pain Goal: 3 Pain Intervention(s): Distraction  Therapy/Group: Individual Therapy  Sonia Baller, MA, CCC-SLP 05/25/20 12:10 PM

## 2020-05-26 ENCOUNTER — Inpatient Hospital Stay (HOSPITAL_COMMUNITY): Payer: 59 | Admitting: Speech Pathology

## 2020-05-26 ENCOUNTER — Inpatient Hospital Stay (HOSPITAL_COMMUNITY): Payer: 59

## 2020-05-26 ENCOUNTER — Inpatient Hospital Stay (HOSPITAL_COMMUNITY): Payer: 59 | Admitting: Occupational Therapy

## 2020-05-26 MED ORDER — BACLOFEN 10 MG PO TABS
10.0000 mg | ORAL_TABLET | Freq: Three times a day (TID) | ORAL | Status: DC
  Administered 2020-05-26 – 2020-05-31 (×15): 10 mg via ORAL
  Filled 2020-05-26 (×15): qty 1

## 2020-05-26 MED ORDER — CLONIDINE HCL 0.1 MG PO TABS
0.2000 mg | ORAL_TABLET | Freq: Two times a day (BID) | ORAL | Status: DC
  Administered 2020-05-26 – 2020-06-01 (×12): 0.2 mg via ORAL
  Filled 2020-05-26 (×12): qty 2

## 2020-05-26 NOTE — Progress Notes (Signed)
Physical Therapy Session Note  Patient Details  Name: [name] MRN: [id number] Date of Birth: 1961/06/18  Today's Date: 05/26/2020 PT Individual Time: 0830-0900 PT Individual Time Calculation (min): 30 min   Short Term Goals:  Week 2:  PT Short Term Goal 1 (Week 2): Pt will perform bed mobility with modA PT Short Term Goal 2 (Week 2): Pt will maintain unsupported sitting EOB with modA PT Short Term Goal 3 (Week 2): Pt will perform transfers with LRAD and modA Week 3:  PT Short Term Goal 1 (Week 3): STG = LTG due to ELOS  Skilled Therapeutic Interventions/Progress Updates:    Session focused on NMR during bed mobility, sitting balance and transfers to address balance, postural control, reorientation to midline. Pt requires mod assist for rolling with mod to max cues for technique and attention several reps for rolling for changing brief (soiled) and donning of pants. Pt requires total assist for threading of LLE and able to assist some with RLE. Once seated EOB requires close supervision to max assist for dynamic sitting balance with poor orientation to midline and assist to don new shirt. Max assist +2 for slideboard transfer to w/c with focus on head/hips relationship and functional balance.   Once up in w/c, addressed w/c adjustments for improved positioning and support of head and trunk (lowered back support and headrest to promote improved alignment) and allow for improved sitting tolerance. Pillows placed for improved alignment of trunk.  Therapy Documentation Precautions:  Precautions Precautions: Fall, Other (comment) Precaution Comments: L hemi, pushing tendency with RUE to weak L side Restrictions Weight Bearing Restrictions: No   Pain: C/o pain in L hip and groin area. MD aware and pt reports being premedicated. Using heating pad.    Therapy/Group: Individual Therapy  Canary Brim Ivory Broad, PT, DPT, CBIS  05/26/2020, 12:00 PM

## 2020-05-26 NOTE — Progress Notes (Signed)
Physical Therapy Weekly Progress Note  Patient Details  Name: [name] MRN: [id number] Date of Birth: Apr 28, 1961  Beginning of progress report period: May 20, 2020 End of progress report period: May 26, 2020  Today's Date: 05/26/2020 PT Individual Time: 1345-1415 PT Individual Time Calculation (min): 30 min   Patient has met 0 of 3 short term goals. Pt is making limited progress since previous weekly note. She continues to require max to totalA for all mobility including rolling, supine<>sit, and sliding board transfers. She has shown brief periods of being able to roll L with modA and sit unsupported with CGA but limited carryover. Pt continues to be primarily limited by L hip pain, L HB flaccidity, verbal perseverations, L inattention, and decreased midline awareness. Family training has been initiated in preparation for upcoming DC with focus on transfer training with hoyer lifts and positioning.   Patient continues to demonstrate the following deficits muscle weakness, impaired timing and sequencing, abnormal tone, unbalanced muscle activation, motor apraxia, decreased coordination and decreased motor planning, decreased midline orientation, decreased attention to left, left side neglect and decreased motor planning and decreased initiation, decreased attention, decreased awareness, decreased problem solving, decreased safety awareness, decreased memory and delayed processing and therefore will continue to benefit from skilled PT intervention to increase functional independence with mobility.  Patient is making limited progress towards long term goals. Will Continue plan of care.  PT Short Term Goals Week 4:  PT Short Term Goal 1 (Week 4): STG = LTG due to ELOS  Skilled Therapeutic Interventions/Progress Updates:    Pt received sitting in w/c, agreeable to PT session. Pt reports unrated L hip pain, rest breaks and mobility provided for pain management. WC transport  with [place] for time management to main therapy gym. SB transfer maxA towards her weaker L side from w/c to mat table with cues for participation and head/hip sequencing. Performed sitting balance training on mat table where she showed ability to maintain sitting with RUE support on mat with CGA. However, when RUE placed in her lap, she quickly will lean and fall to the L. She's aware of this LOB however is unable to correct. Placed small red wedge under L hip and with this she was able to maintain unsupported sitting with CGA with her hand in her lap. Performed target reaching with RUE to focus on L inattention and challenge sitting balance. Frequent LOB's to the L during this although improved ability to correct. WC transport back to her chair via sliding board and maxA towards stronger R side. WC transport back to room where she remained seated in chair with needs in reach, belt alarm on.  Therapy Documentation Precautions:  Precautions Precautions: Fall, Other (comment) Precaution Comments: L hemi, pushing tendency with RUE to weak L side Restrictions Weight Bearing Restrictions: No  Therapy/Group: Individual Therapy  Drako Maese P Altariq Goodall PT 05/26/2020, 12:33 PM

## 2020-05-26 NOTE — Progress Notes (Signed)
Occupational Therapy Session Note  Patient Details  Name: [name] MRN: [id number] Date of Birth: May 03, 1961  Today's Date: 05/26/2020 OT Individual [id number]; [id number] OT Individual Time Calculation (min): 45 min    Short Term Goals: Week 3:  OT Short Term Goal 1 (Week 3): Pt will be able to self correct posture with mod cues and min A when she leans too far to the L. OT Short Term Goal 2 (Week 3): Pt will don shirt with mod A. OT Short Term Goal 3 (Week 3): pt will complete slide board transfer with max A of 1 to increase potential for toilet transfers.  Skilled Therapeutic Interventions/Progress Updates:    Pt supine in bed reporting 10/10 pain in left groin.  Pt agreeable to OT session.  OT noted soiled brief of loose stool present therefore bed level toileting completed with focus on facilitating increased pt independence during rolling to assist.  Pt able to roll to left with min assist and to right with max assist.  Pt needed total dependent toileting and noted continuous incontinence present therefore bedpan placed.  Pt requesting time to complete bowel.  OT returned after 15 minutes for completion of toileting.  Pt performed bed mobility as stated previously and dependent toileting complete with clean brief donned.  Supine to right sidelying and sidelying to sit with max assist.  Noted pt with improved RUE use to push self upright.  Pt instructed through static and dynamic sitting balance task at EOB.  Pt able to self right with CGA initially including with simultaneous head rotation.  After approx 5 minutes of sitting, pt needing increased level of assist of mod assist to maintain midline posture.  Pt returned to supine with total assist.  Call bell in reach, bed alarm on.  Therapy Documentation Precautions:  Precautions Precautions: Fall, Other (comment) Precaution Comments: L hemi, pushing tendency with RUE to weak L side Restrictions Weight Bearing Restrictions:  No   Therapy/Group: Individual Therapy  Ezekiel Slocumb 05/26/2020, 4:41 PM

## 2020-05-26 NOTE — Plan of Care (Signed)
  Problem: Consults Goal: RH STROKE PATIENT EDUCATION Description: See Patient Education module for education specifics  Outcome: Progressing Goal: Nutrition Consult-if indicated Outcome: Progressing Goal: Diabetes Guidelines if Diabetic/Glucose > 140 Description: If diabetic or lab glucose is > 140 mg/dl - Initiate Diabetes/Hyperglycemia Guidelines & Document Interventions  Outcome: Progressing   Problem: RH BLADDER ELIMINATION Goal: RH STG MANAGE BLADDER WITH ASSISTANCE Description: STG Manage Bladder With Mod I Assistance Outcome: Progressing   Problem: RH SAFETY Goal: RH STG ADHERE TO SAFETY PRECAUTIONS W/ASSISTANCE/DEVICE Description: STG Adhere to Safety Precautions With Mod I Assistance/Device. Outcome: Progressing

## 2020-05-26 NOTE — Progress Notes (Addendum)
PHYSICAL MEDICINE & REHABILITATION PROGRESS NOTE  Subjective/Complaints: Patient seen laying in bed this morning.  She states she slept well overnight.  She is working with therapies.  She notes some improvement in pain with change medications yesterday.  Discussed stretching again.  No lethargy noted with increase in medications yesterday per therapies.  ROS: Denies CP, SOB, N/V/D  Objective: Vital Signs: Blood pressure (!) 176/88, pulse 77, temperature 98.1 F (36.7 C), temperature source Oral, resp. rate 18, height 5\' 3"  (1.6 m), weight 67.2 kg, last menstrual period 09/15/2013, SpO2 98 %. No results found. No results for input(s): WBC, HGB, HCT, PLT in the last 72 hours. Recent Labs    05/24/20 0823  NA 137  K 3.7  CL 101  CO2 25  GLUCOSE 93  BUN 20  CREATININE 1.71*  CALCIUM 9.4    Physical Exam: BP (!) 176/88 (BP Location: Right Arm)   Pulse 77   Temp 98.1 F (36.7 C) (Oral)   Resp 18   Ht 5\' 3"  (1.6 m)   Wt 67.2 kg   LMP 09/15/2013   SpO2 98%   BMI 26.24 kg/m  Constitutional: No distress . Vital signs reviewed. HENT: Normocephalic.  Atraumatic. Eyes: EOMI. No discharge. Cardiovascular: No JVD.  RRR. Respiratory: Normal effort.  No stridor.  Bilateral clear to auscultation. GI: Non-distended.  BS +. Skin: Warm and dry.  Intact. Psych: Flat.  Perseverative speech. Musc: Left groin tenderness. Neuro: Alert Left facial weakness Dysarthria, stable Motor: LUE: Proximally 0/5, handgrip 2+/5 with apraxia, unchanged LLE: 0/5 proximal distal, unchanged Increased tone noted LUE/LLE, some improvement  Assessment/Plan: 1. 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  Physiatrist is providing close team supervision and 24 hour management of active medical problems listed below.  Physiatrist and rehab team continue to assess barriers to  discharge/monitor patient progress toward functional and medical goals  Care Tool:  Bathing    Body parts bathed by patient: Left arm, Chest, Abdomen, Face, Right arm   Body parts bathed by helper: Left upper leg, Right upper leg, Buttocks, Front perineal area, Right lower leg, Left lower leg     Bathing assist Assist Level: Maximal Assistance - Patient 24 - 49%     Upper Body Dressing/Undressing Upper body dressing   What is the patient wearing?: Bra, Pull over shirt    Upper body assist Assist Level: Maximal Assistance - Patient 25 - 49%    Lower Body Dressing/Undressing Lower body dressing      What is the patient wearing?: Incontinence brief, Pants     Lower body assist Assist for lower body dressing: Dependent - Patient 0%     Toileting Toileting    Toileting assist Assist for toileting: 2 Helpers     Transfers Chair/bed transfer  Transfers assist     Chair/bed transfer assist level: 2 Helpers     Locomotion Ambulation   Ambulation assist   Ambulation activity did not occur: Safety/medical concerns          Walk 10 feet activity   Assist  Walk 10 feet activity did not occur: Safety/medical concerns        Walk 50 feet activity   Assist Walk 50 feet with 2 turns activity did not occur: Safety/medical concerns         Walk 150 feet activity   Assist Walk 150 feet activity did not occur: Safety/medical  concerns         Walk 10 feet on uneven surface  activity   Assist Walk 10 feet on uneven surfaces activity did not occur: Safety/medical concerns         Wheelchair     Assist Will patient use wheelchair at discharge?: Yes Type of Wheelchair: Manual Wheelchair activity did not occur: Safety/medical concerns         Wheelchair 50 feet with 2 turns activity    Assist    Wheelchair 50 feet with 2 turns activity did not occur: Safety/medical concerns       Wheelchair 150 feet activity     Assist  Wheelchair 150 feet activity did not occur: Safety/medical concerns          Medical Problem List and Plan: 1.Left side hemiparesis, 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  Continue CIR  WHO/PRAFO nightly  Fluoxetine ordered on 9/16 2. Antithrombotics: -DVT/anticoagulation:Lovenox -antiplatelet therapy: Completed 3 weeks of DAPT on 9/15, now on aspirin alone 3. Pain Management:Tylenol and Advil as needed for groin and knee pain, voltaren gel for knee pain  Warm compress left groin, continue   Hip x-ray negative   Continue muscle rub with benefit   Encouraged aggressive stretching with therapies   See #14 4. Mood:Provide emotional support  Amantadine with breakfast and lunch started on 8/25  Per daughter, somnolence is baseline with patient with inconsistent/erratic sleep/wake patterns, often falling asleep if not engaged in activities prior to admission.   Klonopin 0.25mg  daily prn added for anxiety.  -antipsychotic agents: N/A 5. Neuropsych: This patientis not fully of making decisions on herown behalf. 6. Skin/Wound Care:Routine skin checks.  7. Fluids/Electrolytes/Nutrition:Routine in and outs.  8. Hypertension:   Hydralazine - increased to 100mg  TID on 8/26, Norvasc 10 mg daily.   Imdur 30mg    Clonidine 0.1mg  BID, increased to 0.2 BID on 9/16  Added metoprolol 12.5 mg BID, increased to 25 on 9/9  Monitor with increased mobility 9. Hyperlipidemia. Continue Lipitor 10. AKI on CKD. Normal renal function 2017 but no recent visits, does not have a PCP.   Cr.  1.71 on 9/14, labs ordered for tomorrow 11. Leukocytosis: Resolved 12. 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  Continue to advance as tolerated 14. Spasticity- worse in LLE   Dantrolene DC'd on 9/14   Baclofen 5 twice daily started 9/14, increased to 3 times daily on 9/15, increased to 10 3 times daily on 9/16 15.  Tooth  pain:   Outpatient dentist follow-up.  15. Hypokalemia: normalized  16.  Acute blood loss anemia  Hemoglobin 10.4 on 9/13 17.  Slow transit constipation  Bowel meds increased on 9/9, again on 9/10  Improving 18. Nausea/vomitting: Resolved 19. Abdominal pain: Ordered KUB which showed mild colonic stool  Resolved 20. GERD: Protonix. CXR stable.    LOS: 23 days A FACE TO FACE EVALUATION WAS PERFORMED  [place] Lorie Phenix 05/26/2020, 12:48 PM

## 2020-05-27 ENCOUNTER — Inpatient Hospital Stay (HOSPITAL_COMMUNITY): Payer: 59 | Admitting: Speech Pathology

## 2020-05-27 ENCOUNTER — Inpatient Hospital Stay (HOSPITAL_COMMUNITY): Payer: 59

## 2020-05-27 ENCOUNTER — Inpatient Hospital Stay (HOSPITAL_COMMUNITY): Payer: 59 | Admitting: Occupational Therapy

## 2020-05-27 DIAGNOSIS — E876 Hypokalemia: Secondary | ICD-10-CM

## 2020-05-27 LAB — BASIC METABOLIC PANEL
Anion gap: 9 (ref 5–15)
BUN: 18 mg/dL (ref 6–20)
CO2: 22 mmol/L (ref 22–32)
Calcium: 9.5 mg/dL (ref 8.9–10.3)
Chloride: 104 mmol/L (ref 98–111)
Creatinine, Ser: 1.66 mg/dL — ABNORMAL HIGH (ref 0.44–1.00)
GFR calc Af Amer: 39 mL/min — ABNORMAL LOW (ref >60)
GFR calc non Af Amer: 34 mL/min — ABNORMAL LOW (ref >60)
Glucose, Bld: 97 mg/dL (ref 70–99)
Potassium: 3.2 mmol/L — ABNORMAL LOW (ref 3.5–5.1)
Sodium: 135 mmol/L (ref 135–145)

## 2020-05-27 MED ORDER — POTASSIUM CHLORIDE CRYS ER 20 MEQ PO TBCR
30.0000 meq | EXTENDED_RELEASE_TABLET | Freq: Two times a day (BID) | ORAL | Status: AC
  Administered 2020-05-27 – 2020-05-28 (×4): 30 meq via ORAL
  Filled 2020-05-27 (×4): qty 1

## 2020-05-27 NOTE — Progress Notes (Signed)
[place] PHYSICAL MEDICINE & REHABILITATION PROGRESS NOTE  Subjective/Complaints: Patient seen sitting up in her chair working with therapy this morning.  She states she did not sleep well overnight.  Discussed with patient and therapies.  She is lethargic this AM.  She does have improvement in tone.  Discussed with therapies, may need to decrease baclofen if lethargy persists.  ROS: Denies CP, SOB, N/V/D  Objective: Vital Signs: Blood pressure 132/85, pulse 62, temperature 98.2 F (36.8 C), resp. rate 18, height 5\' 3"  (1.6 m), weight 67.2 kg, last menstrual period 09/15/2013, SpO2 100 %. No results found. No results for input(s): WBC, HGB, HCT, PLT in the last 72 hours. Recent Labs    05/27/20 0627  NA 135  K 3.2*  CL 104  CO2 22  GLUCOSE 97  BUN 18  CREATININE 1.66*  CALCIUM 9.5    Physical Exam: BP 132/85 (BP Location: Left Arm)   Pulse 62   Temp 98.2 F (36.8 C)   Resp 18   Ht 5\' 3"  (1.6 m)   Wt 67.2 kg   LMP 09/15/2013   SpO2 100%   BMI 26.24 kg/m  Constitutional: No distress . Vital signs reviewed. HENT: Normocephalic.  Atraumatic. Eyes: EOMI. No discharge. Cardiovascular: No JVD.  RRR. Respiratory: Normal effort.  No stridor.  Bilateral clear to auscultation. GI: Non-distended.  BS +. Skin: Warm and dry.  Intact. Psych: Flat.  Perseverative speech. Musc: Left groin tenderness Neuro: Alert Left facial weakness Left lean Dysarthria, unchanged Motor: LUE: Proximally 0/5, handgrip 2+/5 with apraxia, unchanged LLE: 0/5 proximal distal, unchanged Increased tone noted LUE/LLE, improved  Assessment/Plan: 1. 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  Physiatrist is providing close team supervision and 24 hour management of active medical problems listed below.  Physiatrist and rehab team continue to assess barriers to discharge/monitor  patient progress toward functional and medical goals  Care Tool:  Bathing    Body parts bathed by patient: Left arm, Chest, Abdomen, Face, Right arm   Body parts bathed by helper: Left upper leg, Right upper leg, Buttocks, Front perineal area, Right lower leg, Left lower leg     Bathing assist Assist Level: Maximal Assistance - Patient 24 - 49%     Upper Body Dressing/Undressing Upper body dressing   What is the patient wearing?: Bra, Pull over shirt    Upper body assist Assist Level: Maximal Assistance - Patient 25 - 49%    Lower Body Dressing/Undressing Lower body dressing      What is the patient wearing?: Incontinence brief, Pants     Lower body assist Assist for lower body dressing: Dependent - Patient 0%     Toileting Toileting    Toileting assist Assist for toileting: Dependent - Patient 0%     Transfers Chair/bed transfer  Transfers assist     Chair/bed transfer assist level: 2 Helpers     Locomotion Ambulation   Ambulation assist   Ambulation activity did not occur: Safety/medical concerns          Walk 10 feet activity   Assist  Walk 10 feet activity did not occur: Safety/medical concerns        Walk 50 feet activity   Assist Walk 50 feet with 2 turns activity did not occur: Safety/medical concerns         Walk 150 feet activity   Assist Walk 150 feet activity did  not occur: Safety/medical concerns         Walk 10 feet on uneven surface  activity   Assist Walk 10 feet on uneven surfaces activity did not occur: Safety/medical concerns         Wheelchair     Assist Will patient use wheelchair at discharge?: Yes Type of Wheelchair: Manual Wheelchair activity did not occur: Safety/medical concerns         Wheelchair 50 feet with 2 turns activity    Assist    Wheelchair 50 feet with 2 turns activity did not occur: Safety/medical concerns       Wheelchair 150 feet activity     Assist  Wheelchair 150 feet activity did not occur: Safety/medical concerns          Medical Problem List and Plan: 1.Left side hemiparesis, 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  Continue CIR  WHO/PRAFO nightly  Fluoxetine ordered on 9/16 2. Antithrombotics: -DVT/anticoagulation:Lovenox -antiplatelet therapy: Completed 3 weeks of DAPT on 9/15, now on aspirin alone 3. Pain Management:Tylenol and Advil as needed for groin and knee pain, voltaren gel for knee pain  Warm compress left groin, continue   Hip x-ray negative   Continue muscle rub with benefit   Encouraged aggressive stretching with therapies   See #14   MRI ordered 4. Mood:Provide emotional support  Amantadine with breakfast and lunch started on 8/25  Per daughter, somnolence is baseline with patient with inconsistent/erratic sleep/wake patterns, often falling asleep if not engaged in activities prior to admission.   Klonopin 0.25mg  daily prn added for anxiety.  -antipsychotic agents: N/A 5. Neuropsych: This patientis not fully of making decisions on herown behalf. 6. Skin/Wound Care:Routine skin checks.  7. Fluids/Electrolytes/Nutrition:Routine in and outs.  8. Hypertension:   Hydralazine - increased to 100mg  TID on 8/26, Norvasc 10 mg daily.   Imdur 30mg    Clonidine 0.1mg  BID, increased to 0.2 BID on 9/16  Added metoprolol 12.5 mg BID, increased to 25 on 9/9  Appears to be improving on 9/17  Monitor with increased mobility 9. Hyperlipidemia. Continue Lipitor 10. AKI on CKD. Normal renal function 2017 but no recent visits, does not have a PCP.   Cr.  1.66 on 9/17 11. Leukocytosis: Resolved 12. 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  Continue to advance as tolerated 14. Spasticity- worse in LLE   Dantrolene DC'd on 9/14   Baclofen 5 twice daily started 9/14, increased to 3 times daily on 9/15, increased to 10 3 times  daily on 9/16  Improving on 9/17, however will need to monitor for daytime lethargy/somnolence closely.  Discussed with therapies-will decrease baclofen if symptoms persistent 15.  Tooth pain:   Outpatient dentist follow-up.  15. Hypokalemia: normalized  16.  Acute blood loss anemia  Hemoglobin 10.4 on 9/13, labs ordered for Monday 17.  Slow transit constipation  Bowel meds increased on 9/9, again on 9/10  Improving 18. Nausea/vomitting: Resolved 19. Abdominal pain: Ordered KUB which showed mild colonic stool  Resolved 20. GERD: Protonix. CXR stable.   21.  Hypokalemia  Potassium 3.2 on 9/17, labs ordered for Monday  Supplemented x2 days  LOS: 24 days A FACE TO FACE EVALUATION WAS PERFORMED  Ridhi Hoffert Lorie Phenix 05/27/2020, 9:44 AM

## 2020-05-27 NOTE — Progress Notes (Signed)
Patient ID: [name], female   DOB: 1960-11-27, 59 y.o.   MRN: [id number]  Met with pt and [name]-daughter who is here for therapies, discussed team conference and progress and focus now on family education with she and her sister. Discussed ramp needed for the three steps into home, step-father is looking into this. Will give resources for private-temporary ramps. Also discussed ambulance transport home, if needed. PT to give daughter measurement sheet for step-dad to measure doorways and prepare for discharge. Have ordered hospital bed, hoyer lift and drop-arm bsc will contact daughter to arrange delivery of these items hopefully Monday. Working toward discharge 9/22-Wednesday. Wheelchair eval on Monday will need a loaner wheelchair to go home with. Will continue to work toward discharge, daughter from [place] to continue family education over the weekend.

## 2020-05-27 NOTE — Progress Notes (Addendum)
Occupational Therapy Session Note  Patient Details  Name: [name] MRN: [id number] Date of Birth: February 13, 1961  Today's Date: 05/27/2020 OT Individual Time: 1747-1595 OT Individual Time Calculation (min): 38 min    Short Term Goals: Week 3:  OT Short Term Goal 1 (Week 3): Pt will be able to self correct posture with mod cues and min A when she leans too far to the L. OT Short Term Goal 2 (Week 3): Pt will don shirt with mod A. OT Short Term Goal 3 (Week 3): pt will complete slide board transfer with max A of 1 to increase potential for toilet transfers.  Skilled Therapeutic Interventions/Progress Updates:    Pt sitting up in w/c asleep.  Upon waking, pt tearful, however reports she doesn't know why.  Pt requesting to go outside for fresh air.  Transported pt to ground floor [facility] entrance for completion of seated dynamic balance, functional reach, and cognitive/visual tracking tasks.  Pt frequently falling asleep throughout session, needing max multimodal cues to facilitate arousal and participation.  Pt needing min assist to forward lean away from w/c back rest. Pt completed task of copying egg pattern by placing multicolored eggs in a row matching picture placed above.  Max VCs needed for problem solving and attention.  Pt returned to room, call bell in reach, seat belt alarm on.     Therapy Documentation Precautions:  Precautions Precautions: Fall, Other (comment) Precaution Comments: L hemi, pushing tendency with RUE to weak L side Restrictions Weight Bearing Restrictions: No   Therapy/Group: Individual Therapy  Ezekiel Slocumb 05/27/2020, 4:18 PM

## 2020-05-27 NOTE — Plan of Care (Signed)
  Problem: Consults Goal: RH STROKE PATIENT EDUCATION Description: See Patient Education module for education specifics  Outcome: Progressing Goal: Nutrition Consult-if indicated Outcome: Progressing Goal: Diabetes Guidelines if Diabetic/Glucose > 140 Description: If diabetic or lab glucose is > 140 mg/dl - Initiate Diabetes/Hyperglycemia Guidelines & Document Interventions  Outcome: Progressing   Problem: RH BOWEL ELIMINATION Goal: RH STG MANAGE BOWEL WITH ASSISTANCE Description: STG Manage Bowel with Mod I Assistance. Outcome: Progressing Goal: RH STG MANAGE BOWEL W/MEDICATION W/ASSISTANCE Description: STG Manage Bowel with Medication with Mod I Assistance. Outcome: Progressing   Problem: RH BLADDER ELIMINATION Goal: RH STG MANAGE BLADDER WITH ASSISTANCE Description: STG Manage Bladder With Mod I Assistance Outcome: Progressing   Problem: RH SKIN INTEGRITY Goal: RH STG SKIN FREE OF INFECTION/BREAKDOWN Description: Mod I Outcome: Progressing   Problem: RH KNOWLEDGE DEFICIT Goal: RH STG INCREASE KNOWLEDGE OF HYPERTENSION Description: Patient will be able to manage HTN with medications and dietary restrictions using handouts and educational information independently Outcome: Progressing Goal: RH STG INCREASE KNOWLEDGE OF STROKE PROPHYLAXIS Description: Patient will acknowledge understanding of secondary stroke risks and management with stroke prophylaxis medications using handouts and educational materials independently Outcome: Progressing

## 2020-05-27 NOTE — Progress Notes (Signed)
Physical Therapy Session Note  Patient Details  Name: [name] MRN: [id number] Date of Birth: [birth year]  Today's Date: 05/27/2020 PT Individual Time: 4098-1191 +  1345-1430 PT Individual Time Calculation (min): 115 min  + 45 min  Short Term Goals: Week 4:  PT Short Term Goal 1 (Week 4): STG = LTG due to ELOS  Skilled Therapeutic Interventions/Progress Updates:     1st session: Pt received supine in bed, RN present as well for morning medications, pt agreeable to PT session. Noted LUE splint on but not her LLE prafoboot, encouraged her to remind staffing at night to put both on. Pt reports poor nights sleep, contributes to L hip pain. Encouraged her to use call bell for nursing staff to assist with turning her and making her comfortable. Donned pants with totalA while she lay supine, encouraged her to use RUE to assist with pulling them up but she was unable to reach past her knees. Supine<>sit with maxA with HOB flat, encouraging her to use RUE to assist with upright positioning. Once sitting EOB, she put her T-shirt on with maxA, requiring step-by-step cues both visually, tactically, and verbally for correct sequencing. Sliding board transfer with maxA from EOB to w/c (towards weaker L side) and totalA transport to main therapy gym for time management. Sliding board transfer with maxA to mat table with cues for head/hip and RUE placement. Once sitting EOM with mirror placed in front of her and 4inch platform under feet, performed sitting balance neuro-re ed focusing on neutral cervical positioning and midline orientation. She was able to sit EOM with min/modA 2/2 posterior and L lateral lean. L hand flattened to promote WB. Performed various tasks in sitting including placing clothes pins on basketball net that was positioned to her R and placing them in a bucket on her L to assist with L inattention. Also performed 1x5 R lateral elbow leans and 1x3 L lateral elbow leans with cues for lean  to sit and sit to lean. Pt with onset of fatigue after ~45 minutes of this and was falling asleep. Therefore, placed her supine with maxA on mat table and performed supine LLE stretches to tolerance including proximal/distal hamstring, glut, groin, hip abduction/adduction, and hip internal/external rotation. Pt unable to tolerate much ROM, especially with overpressure in figure-4 position, becoming tearful 2/2 L hip pain. Daughter arriving at this point and transitioned focus of session to perform family training. Educated daughter, [name], on [place] lift mechanisms and transfers. [name] performed [place] transfer with minA from therapist however overall demonstrated great knowledge and technique. Noted she had soiled brief once transferred back to chair. WC transport back to room and sliding board transfer with maxA from w/c to EOB. Sit<>supine with maxA and pericare performed with totalA. Provided daughter with home measurement sheet with focus on door width, she verbalized understanding. Pt ended session supine in bed with needs in reach, daughter at bedside, bed alarm on.  2nd session: Pt received supine in bed, daughter at bedside, pt agreeable to PT session. Donned pants with maxA while supine, rolling L with min/modA and requiring maxA for rolling R. Pt frequently placed her R hand behind her head for ?comfort, requires frequent cues for RUE participation during functional mobility. Supine<>sit with maxA with HOB flat. Sit<>stand with maxA +2 in [place] and encouraged RUE to place LUE to grip stedy. In perched position in [place], she required max/totalA for maintaining balance due to L lateral lean, despite mult-modal cueing a mirror for visual  feedback. Wheeled in [place] to sinkside in her room. While sitting in perched position, she brushed her teeth using RUE and rinsed her mouth with mouthwash requiring maxA/totalA for sitting posture due to L lateral lean. RN present at this time who delieved  medication and she required ++ time for swallowing x3 pills. Performed another sit<>stand with maxA +2 in [place] and sat in TIS w/c where she was positioned to comfort. Ended session seated in w/c with therapy tech present, needs in reach.   Therapy Documentation Precautions:  Precautions Precautions: Fall, Other (comment) Precaution Comments: L hemi, pushing tendency with RUE to weak L side Restrictions Weight Bearing Restrictions: No  Therapy/Group: Individual Therapy  [name] PT 05/27/2020, 7:48 AM

## 2020-05-27 NOTE — Progress Notes (Signed)
Speech Language Pathology Daily Session Note  Patient Details  Name: [name] MRN: [id number] Date of Birth: 10-05-1960  Today's Date: 05/27/2020 SLP Individual Time: 1015-1050 SLP Individual Time Calculation (min): 35 min  Short Term Goals: Week 4: SLP Short Term Goal 1 (Week 4): STG=LTG's (ELOS of discharge on 9/22) SLP Short Term Goal 1 - Progress (Week 4): Other (comment)  Skilled Therapeutic Interventions: Pt was seen for skilled ST targeting cognitive goals. SLP facilitated with Min increasing to Mod-Max A for problem solving, error awareness, and redirection to a color pattern board matching task due to increasing lethargy as session progressed. Pt's daughter, [name], present for session and discussed fluctuating levels of fatigue, impact on safety and progress, and to expect fluctuations to continue when returning home. Pt eventually too fatigued to continue session, and therefore missed remaining 10 minutes. Pt left laying in bed with alarm set and needs within reach, daughter present. Continue per current plan of care.        Pain Pain Assessment Pain Scale: Faces  Faces Pain Scale: Hurts little more Pain Type: Chronic pain Pain Location: Leg  Pain Orientation: Left  Pain Descriptors / Indicators: Aching  Pain Onset: On-going Patients Stated Pain Goal: 1 Pain Intervention(s): Heat applied  Multiple Pain Sites: No  Therapy/Group: Individual Therapy  Arbutus Leas 05/27/2020, 11:00 AM

## 2020-05-28 ENCOUNTER — Inpatient Hospital Stay (HOSPITAL_COMMUNITY): Payer: 59 | Admitting: Physical Therapy

## 2020-05-28 NOTE — Plan of Care (Signed)
  Problem: RH BOWEL ELIMINATION Goal: RH STG MANAGE BOWEL WITH ASSISTANCE Description: STG Manage Bowel with Mod I Assistance. Outcome: Progressing Goal: RH STG MANAGE BOWEL W/MEDICATION W/ASSISTANCE Description: STG Manage Bowel with Medication with Mod I Assistance. Outcome: Progressing   Problem: RH BLADDER ELIMINATION Goal: RH STG MANAGE BLADDER WITH ASSISTANCE Description: STG Manage Bladder With Mod I Assistance Outcome: Progressing   Problem: RH SKIN INTEGRITY Goal: RH STG SKIN FREE OF INFECTION/BREAKDOWN Description: Mod I Outcome: Progressing

## 2020-05-28 NOTE — Progress Notes (Signed)
[facility] PHYSICAL MEDICINE & REHABILITATION PROGRESS NOTE  Subjective/Complaints:   No complaints of Hip pain  ROS: Denies CP, SOB, N/V/D  Objective: Vital Signs: Blood pressure (!) 142/73, pulse 60, temperature 98.2 F (36.8 C), resp. rate 18, height 5\' 3"  (1.6 m), weight 67.2 kg, last menstrual period 09/15/2013, SpO2 100 %. No results found. No results for input(s): WBC, HGB, HCT, PLT in the last 72 hours. Recent Labs    05/27/20 0627  NA 135  K 3.2*  CL 104  CO2 22  GLUCOSE 97  BUN 18  CREATININE 1.66*  CALCIUM 9.5    Physical Exam: BP (!) 142/73 (BP Location: Right Arm)   Pulse 60   Temp 98.2 F (36.8 C)   Resp 18   Ht 5\' 3"  (1.6 m)   Wt 67.2 kg   LMP 09/15/2013   SpO2 100%   BMI 26.24 kg/m   General: No acute distress Mood and affect are appropriate Heart: Regular rate and rhythm no rubs murmurs or extra sounds Lungs: Clear to auscultation, breathing unlabored, no rales or wheezes Abdomen: Positive bowel sounds, soft nontender to palpation, nondistended Extremities: No clubbing, cyanosis, or edema Skin: No evidence of breakdown, no evidence of rash L side too weak for cerebellar testing , + dysarthria   Motor: LUE: Proximally 0/5, handgrip 2+/5 with apraxia, unchanged LLE: 0/5 proximal distal, unchanged Increased tone noted LUE/LLE, improved No pain with gentle LLE ROM  Assessment/Plan: 1. 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  Physiatrist is providing close team supervision and 24 hour management of active medical problems listed below.  Physiatrist and rehab team continue to assess barriers to discharge/monitor patient progress toward functional and medical goals  Care Tool:  Bathing    Body parts bathed by patient: Left arm, Chest, Abdomen, Face, Right arm   Body parts bathed by helper: Left upper leg, Right upper leg,  Buttocks, Front perineal area, Right lower leg, Left lower leg     Bathing assist Assist Level: Maximal Assistance - Patient 24 - 49%     Upper Body Dressing/Undressing Upper body dressing   What is the patient wearing?: Bra, Pull over shirt    Upper body assist Assist Level: Maximal Assistance - Patient 25 - 49%    Lower Body Dressing/Undressing Lower body dressing      What is the patient wearing?: Incontinence brief, Pants     Lower body assist Assist for lower body dressing: Dependent - Patient 0%     Toileting Toileting    Toileting assist Assist for toileting: Dependent - Patient 0%     Transfers Chair/bed transfer  Transfers assist     Chair/bed transfer assist level: 2 Helpers     Locomotion Ambulation   Ambulation assist   Ambulation activity did not occur: Safety/medical concerns          Walk 10 feet activity   Assist  Walk 10 feet activity did not occur: Safety/medical concerns        Walk 50 feet activity   Assist Walk 50 feet with 2 turns activity did not occur: Safety/medical concerns         Walk 150 feet activity   Assist Walk 150 feet activity did not occur: Safety/medical concerns         Walk 10 feet on uneven surface  activity   Assist Walk 10 feet on uneven surfaces activity  did not occur: Safety/medical concerns         Wheelchair     Assist Will patient use wheelchair at discharge?: Yes Type of Wheelchair: Manual Wheelchair activity did not occur: Safety/medical concerns         Wheelchair 50 feet with 2 turns activity    Assist    Wheelchair 50 feet with 2 turns activity did not occur: Safety/medical concerns       Wheelchair 150 feet activity     Assist Wheelchair 150 feet activity did not occur: Safety/medical concerns          Medical Problem List and Plan: 1.Left side hemiparesis, 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  Continue  CIR  WHO/PRAFO nightly  Fluoxetine ordered on 9/16 2. Antithrombotics: -DVT/anticoagulation:Lovenox -antiplatelet therapy: Completed 3 weeks of DAPT on 9/15, now on aspirin alone 3. Pain Management:Tylenol and Advil as needed for groin and knee pain, voltaren gel for knee pain  Warm compress left groin, continue   Hip x-ray negative   Continue muscle rub with benefit   Encouraged aggressive stretching with therapies   See #14   MRI ordered- completed but no report available  4. Mood:Provide emotional support  Amantadine with breakfast and lunch started on 8/25  Per daughter, somnolence is baseline with patient with inconsistent/erratic sleep/wake patterns, often falling asleep if not engaged in activities prior to admission.   Klonopin 0.25mg  daily prn added for anxiety.  -antipsychotic agents: N/A 5. Neuropsych: This patientis not fully of making decisions on herown behalf. 6. Skin/Wound Care:Routine skin checks.  7. Fluids/Electrolytes/Nutrition:Routine in and outs.  8. Hypertension:   Hydralazine - increased to 100mg  TID on 8/26, Norvasc 10 mg daily.   Imdur 30mg    Clonidine 0.1mg  BID, increased to 0.2 BID on 9/16  Added metoprolol 12.5 mg BID, increased to 25 on 9/9 Vitals:   05/27/20 2130 05/28/20 0400  BP: (!) 172/99 (!) 142/73  Pulse: 68 60  Resp: 17 18  Temp: 97.7 F (36.5 C) 98.2 F (36.8 C)  SpO2:  100%  HR controlled still labile, cont current meds  Monitor with increased mobility 9. Hyperlipidemia. Continue Lipitor 10. AKI on CKD. Normal renal function 2017 but no recent visits, does not have a PCP.   Cr.  1.66 on 9/17 11. Leukocytosis: Resolved 12. 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  Continue to advance as tolerated 14. Spasticity- worse in LLE   Dantrolene DC'd on 9/14   Baclofen 5 twice daily started 9/14, increased to 3 times daily on 9/15, increased to 10 3 times daily on  9/16  Improving on 9/17, however will need to monitor for daytime lethargy/somnolence closely.  Discussed with therapies-will decrease baclofen if symptoms persistent 15.  Tooth pain:   Outpatient dentist follow-up.  15. Hypokalemia: normalized  16.  Acute blood loss anemia  Hemoglobin 10.4 on 9/13, labs ordered for Monday 17.  Slow transit constipation  Bowel meds increased on 9/9, again on 9/10  Improving 18. Nausea/vomitting: Resolved 19. Abdominal pain: Ordered KUB which showed mild colonic stool  Resolved 20. GERD: Protonix. CXR stable.   21.  Hypokalemia  Potassium 3.2 on 9/17, labs ordered for Monday  Supplemented x2 days  LOS: 25 days A FACE TO [place] Baylin Gamblin 05/28/2020, 7:26 AM

## 2020-05-29 ENCOUNTER — Inpatient Hospital Stay (HOSPITAL_COMMUNITY): Payer: 59 | Admitting: Physical Therapy

## 2020-05-29 ENCOUNTER — Inpatient Hospital Stay (HOSPITAL_COMMUNITY): Payer: 59 | Admitting: Speech Pathology

## 2020-05-29 NOTE — Progress Notes (Signed)
[place] PHYSICAL MEDICINE & REHABILITATION PROGRESS NOTE  Subjective/Complaints:   No complaints of Hip pain , discussed hip MRI result ROS: Denies CP, SOB, N/V/D  Objective: Vital Signs: Blood pressure (!) 168/98, pulse 60, temperature 98.1 F (36.7 C), temperature source Oral, resp. rate 18, height 5\' 3"  (1.6 m), weight 67.2 kg, last menstrual period 09/15/2013, SpO2 100 %. MR PELVIS WO CONTRAST  Result Date: 05/28/2020 CLINICAL DATA:  Pelvic pain. EXAM: MRI PELVIS WITHOUT CONTRAST TECHNIQUE: Multiplanar multisequence MR imaging of the pelvis was performed. No intravenous contrast was administered. COMPARISON:  None. FINDINGS: Bones: No hip fracture, dislocation or avascular necrosis. No periosteal reaction or bone destruction. No aggressive osseous lesion. Normal sacrum and sacroiliac joints. No SI joint widening or erosive changes. Degenerative disc disease with disc height loss at L5-S1. minimal grade 1 anterolisthesis of L4 on L5 secondary to moderate bilateral facet arthropathy. Bilateral foraminal narrowing at L4-5 with mild spinal stenosis. Articular cartilage and labrum Articular cartilage:  No chondral defect. Labrum: Grossly intact, but evaluation is limited by lack of intraarticular fluid. Joint or bursal effusion Joint effusion:  No hip joint effusion.  No SI joint effusion. Bursae:  No bursa formation. Muscles and tendons Flexors: Normal. Extensors: Normal. Abductors: Normal. Adductors: Mild muscle edema in the left adductor musculature likely reflecting muscle strain versus myositis. Gluteals: Mild muscle edema in the gluteus minimus and medius muscle bilaterally which may reflect mild myositis or muscle strain, left greater than right. Hamstrings: Normal. Other findings No pelvic free fluid. No fluid collection or hematoma. No inguinal lymphadenopathy. No inguinal hernia. Soft tissue edema in the subcutaneous fat superficial to the gluteus maximus muscle bilaterally likely  reflecting contusions or fat necrosis. IMPRESSION: 1. No hip fracture, dislocation or avascular necrosis. 2. Mild muscle edema in the left adductor musculature likely reflecting muscle strain versus myositis. 3. Mild muscle edema in the gluteus minimus and medius muscle bilaterally which may reflect mild myositis or muscle strain, left greater than right. 4. Soft tissue edema in the subcutaneous fat superficial to the gluteus maximus muscle bilaterally likely reflecting contusions or fat necrosis. Electronically Signed   By: Kathreen Devoid   On: 05/28/2020 09:58   No results for input(s): WBC, HGB, HCT, PLT in the last 72 hours. Recent Labs    05/27/20 0627  NA 135  K 3.2*  CL 104  CO2 22  GLUCOSE 97  BUN 18  CREATININE 1.66*  CALCIUM 9.5    Physical Exam: BP (!) 168/98 (BP Location: Right Arm)   Pulse 60   Temp 98.1 F (36.7 C) (Oral)   Resp 18   Ht 5\' 3"  (1.6 m)   Wt 67.2 kg   LMP 09/15/2013   SpO2 100%   BMI 26.24 kg/m    General: No acute distress Mood and affect are appropriate Heart: Regular rate and rhythm no rubs murmurs or extra sounds Lungs: Clear to auscultation, breathing unlabored, no rales or wheezes Abdomen: Positive bowel sounds, soft nontender to palpation, nondistended Extremities: No clubbing, cyanosis, or edema Skin: No evidence of breakdown, no evidence of rash MSK mild left hip pain with PROM int ext rotation and abd add   L side too weak for cerebellar testing , + dysarthria   Motor: LUE: Proximally 0/5, handgrip 2+/5 with apraxia, unchanged LLE: 0/5 proximal distal, unchanged Increased tone noted LUE/LLE, improved No pain with gentle LLE ROM  Assessment/Plan: 1. 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  Physiatrist is providing close team supervision and 24 hour management of active medical problems listed below.  Physiatrist and  rehab team continue to assess barriers to discharge/monitor patient progress toward functional and medical goals  Care Tool:  Bathing    Body parts bathed by patient: Left arm, Chest, Abdomen, Face, Right arm   Body parts bathed by helper: Left upper leg, Right upper leg, Buttocks, Front perineal area, Right lower leg, Left lower leg     Bathing assist Assist Level: Maximal Assistance - Patient 24 - 49%     Upper Body Dressing/Undressing Upper body dressing   What is the patient wearing?: Bra, Pull over shirt    Upper body assist Assist Level: Maximal Assistance - Patient 25 - 49%    Lower Body Dressing/Undressing Lower body dressing      What is the patient wearing?: Incontinence brief, Pants     Lower body assist Assist for lower body dressing: Dependent - Patient 0%     Toileting Toileting    Toileting assist Assist for toileting: Dependent - Patient 0%     Transfers Chair/bed transfer  Transfers assist     Chair/bed transfer assist level: 2 Helpers     Locomotion Ambulation   Ambulation assist   Ambulation activity did not occur: Safety/medical concerns          Walk 10 feet activity   Assist  Walk 10 feet activity did not occur: Safety/medical concerns        Walk 50 feet activity   Assist Walk 50 feet with 2 turns activity did not occur: Safety/medical concerns         Walk 150 feet activity   Assist Walk 150 feet activity did not occur: Safety/medical concerns         Walk 10 feet on uneven surface  activity   Assist Walk 10 feet on uneven surfaces activity did not occur: Safety/medical concerns         Wheelchair     Assist Will patient use wheelchair at discharge?: Yes Type of Wheelchair: Manual Wheelchair activity did not occur: Safety/medical concerns         Wheelchair 50 feet with 2 turns activity    Assist    Wheelchair 50 feet with 2 turns activity did not occur: Safety/medical  concerns       Wheelchair 150 feet activity     Assist Wheelchair 150 feet activity did not occur: Safety/medical concerns          Medical Problem List and Plan: 1.Left side hemiparesis, 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  Continue CIR  WHO/PRAFO nightly  Fluoxetine ordered on 9/16 2. Antithrombotics: -DVT/anticoagulation:Lovenox -antiplatelet therapy: Completed 3 weeks of DAPT on 9/15, now on aspirin alone 3. Pain Management:Tylenol and Advil as needed for groin and knee pain, voltaren gel for knee pain  Warm compress left groin, continue   Hip x-ray negative   Continue muscle rub with benefit   Encouraged aggressive stretching with therapies   See #14   MRI ordered-mild hip add/abd strain 4. Mood:Provide emotional support  Amantadine with breakfast and lunch started on 8/25  Per daughter, somnolence is baseline with patient with inconsistent/erratic sleep/wake patterns, often falling asleep if not engaged in activities prior to admission.   Klonopin 0.25mg  daily prn added for anxiety.  -antipsychotic agents: N/A 5. Neuropsych: This patientis not fully of making decisions on herown behalf. 6. Skin/Wound  Care:Routine skin checks.  7. Fluids/Electrolytes/Nutrition:Routine in and outs.  8. Hypertension:   Hydralazine - increased to 100mg  TID on 8/26, Norvasc 10 mg daily.   Imdur 30mg    Clonidine 0.1mg  BID, increased to 0.2 BID on 9/16  Added metoprolol 12.5 mg BID, increased to 25 on 9/9 Vitals:   05/28/20 1946 05/29/20 0422  BP: (!) 157/86 (!) 168/98  Pulse: 63 60  Resp: 16 18  Temp: (!) 97.5 F (36.4 C) 98.1 F (36.7 C)  SpO2: 97% 100%  HR controlled still labile, elevated BP may need to increase clonidine if this persists  Monitor with increased mobility 9. Hyperlipidemia. Continue Lipitor 10. AKI on CKD. Normal renal function 2017 but no recent visits, does not have a PCP.   Cr.  1.66  on 9/17 11. Leukocytosis: Resolved 12. 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  Continue to advance as tolerated 14. Spasticity- worse in LLE   Dantrolene DC'd on 9/14   Baclofen 5 twice daily started 9/14, increased to 3 times daily on 9/15, increased to 10 3 times daily on 9/16  Improving on 9/17, however will need to monitor for daytime lethargy/somnolence closely.  Discussed with therapies-will decrease baclofen if symptoms persistent 15.  Tooth pain:   Outpatient dentist follow-up.  15. Hypokalemia: normalized  16.  Acute blood loss anemia  Hemoglobin 10.4 on 9/13, labs ordered for Monday 17.  Slow transit constipation  Bowel meds increased on 9/9, again on 9/10  Improving 18. Nausea/vomitting: Resolved 19. Abdominal pain: Ordered KUB which showed mild colonic stool  Resolved 20. GERD: Protonix. CXR stable.   21.  Hypokalemia  Potassium 3.2 on 9/17, labs ordered for Monday  Supplemented x2 days  LOS: 26 days A FACE TO [place] E Nayleen Janosik 05/29/2020, 7:59 AM

## 2020-05-29 NOTE — Progress Notes (Signed)
Physical Therapy Session Note  Patient Details  Name: [name] MRN: [id number] Date of Birth: July 03, 1961  Today's Date: 05/29/2020 PT Individual Time: 0211-1552 PT Individual Time Calculation (min): 41 min   Short Term Goals: Week 4:  PT Short Term Goal 1 (Week 4): STG = LTG due to ELOS  Skilled Therapeutic Interventions/Progress Updates: Pt presents supine in bed and agreeable to therapy.  Pt answering and perseverating on words, repeating instructions.  Pt required mod A to turn from side to side to don pants.  Pt required verbal cues for hooklying position and manual assist fro RLE.  Pt c/o left hip pain of 10/10, although told MD no pain.  Pt required max A for R side-lying to sit transfers w/ verbal and tactile cues for RUE use.  Pt assisted to foot-flat position and manual cueing for midline posture. Pt able to maintain position approx. 5-10 seconds, but the lists to left or posterior w/ any perterbation or UE use.  Pt repeating directions for midline posture, but unable to maintain w/ RUE support.  Pt performed down on R elbow/FA and then back to midline w/ mod A and verbal cues, but total A from Left even w/ use of RUE attempting to push to upright.  Pt performed sit to R sidelying position and then supine and then sidelying back to sit w/ Max A, although pt does bring LEs towards side of bed w/ cueing.  Pt returned to supine position w/ max A.  Bed alarm on and all needs in reach.     Therapy Documentation Precautions:  Precautions Precautions: Fall, Other (comment) Precaution Comments: L hemi, pushing tendency with RUE to weak L side Restrictions Weight Bearing Restrictions: No General:   Vital Signs:  Pain: pt c/o 10/10 pain to L hip   :      Therapy/Group: Individual Therapy  Ladoris Gene 05/29/2020, 9:31 AM

## 2020-05-29 NOTE — Progress Notes (Signed)
Speech Language Pathology Daily Session Note  Patient Details  Name: [name] MRN: [id number] Date of Birth: [birth year]  Today's Date: 05/29/2020 SLP Individual Time: 1224-8250 SLP Individual Time Calculation (min): 40 min  Short Term Goals: Week 4: SLP Short Term Goal 1 (Week 4): STG=LTG's (ELOS of discharge on 9/22) SLP Short Term Goal 1 - Progress (Week 4): Other (comment)  Skilled Therapeutic Interventions:  Pt was seen for skilled ST targeting family education as pt's daughter [name] was present for treatment session today.  Pt's daughter presented with questions regarding techniques or "exercises" to improve pt's speech that could be done in the home environment.  SLP provided skilled education regarding use of compensatory strategies such as slow rate, increased vocal intensity, and overarticulation to improve intelligibility during conversations with pt.  SLP also discussed how pt's intelligibility will likely be impacted by lethargy and that pt's daughter should anticipate providing pt with more support or allowing her time to rest when pt is fatigued.  Pt's daughter also presented with questions regarding ways to maximize pt's cognitive recovery at home.  A particular challenge when considering this question is that pt had few hobbies or interests at baseline as she worked ~60 hrs per week for 30+ years.  SLP discussed following pt's lead in terms of what she is attending to and then attempting to make that task or activity therapeutic.  SLP discussed distraction management techniques as pt is reported to have a very brief attention span which impacts all higher level cognitive processes and also emphasized that pt will need to have tasks broken down into smaller more management units to maximize her functional independence.  When discussing pt's current diet recommendations, pt's daughter reports feeling very comfortable with modified diets and was able to appropriately verbalize  appropriate diet textures/consistencies in light of pt's attention and oral motor deficits which at times lead to pocketing.  All questions were answered to pt's and daughter's satisfaction at this time.  Pt was left in bed with daughter at bedside.  Continue per current plan of care.    Pain Pain Assessment Pain Scale: 0-10 Pain Score: 0-No pain  Therapy/Group: Individual Therapy  [name], Selinda Orion 05/29/2020, 12:53 PM

## 2020-05-29 NOTE — Progress Notes (Signed)
Pt medicated with prn tylenol x1 for c/o left hip pain with positive effects noted. Pt's daughter at bedside most of the day. Pt requiring frequent stimulation to stay awake during the day. Was assisted out of bed and into her wheelchair, daughter accompanied pt off of unit with grounds pass. Pt stable, alert and responsive.

## 2020-05-30 ENCOUNTER — Inpatient Hospital Stay (HOSPITAL_COMMUNITY): Payer: 59 | Admitting: Physical Therapy

## 2020-05-30 ENCOUNTER — Inpatient Hospital Stay (HOSPITAL_COMMUNITY): Payer: 59

## 2020-05-30 LAB — CBC WITH DIFFERENTIAL/PLATELET
Abs Immature Granulocytes: 0.02 10*3/uL (ref 0.00–0.07)
Basophils Absolute: 0 10*3/uL (ref 0.0–0.1)
Basophils Relative: 0 %
Eosinophils Absolute: 0.2 10*3/uL (ref 0.0–0.5)
Eosinophils Relative: 3 %
HCT: 29.4 % — ABNORMAL LOW (ref 36.0–46.0)
Hemoglobin: 9.3 g/dL — ABNORMAL LOW (ref 12.0–15.0)
Immature Granulocytes: 0 %
Lymphocytes Relative: 20 %
Lymphs Abs: 1.1 10*3/uL (ref 0.7–4.0)
MCH: 28.4 pg (ref 26.0–34.0)
MCHC: 31.6 g/dL (ref 30.0–36.0)
MCV: 89.9 fL (ref 80.0–100.0)
Monocytes Absolute: 0.6 10*3/uL (ref 0.1–1.0)
Monocytes Relative: 10 %
Neutro Abs: 3.7 10*3/uL (ref 1.7–7.7)
Neutrophils Relative %: 67 %
Platelets: UNDETERMINED 10*3/uL (ref 150–400)
RBC: 3.27 MIL/uL — ABNORMAL LOW (ref 3.87–5.11)
RDW: 14.8 % (ref 11.5–15.5)
WBC: 5.6 10*3/uL (ref 4.0–10.5)
nRBC: 0 % (ref 0.0–0.2)

## 2020-05-30 LAB — BASIC METABOLIC PANEL
Anion gap: 9 (ref 5–15)
BUN: 13 mg/dL (ref 6–20)
CO2: 23 mmol/L (ref 22–32)
Calcium: 9.6 mg/dL (ref 8.9–10.3)
Chloride: 106 mmol/L (ref 98–111)
Creatinine, Ser: 1.57 mg/dL — ABNORMAL HIGH (ref 0.44–1.00)
GFR calc Af Amer: 42 mL/min — ABNORMAL LOW (ref >60)
GFR calc non Af Amer: 36 mL/min — ABNORMAL LOW (ref >60)
Glucose, Bld: 113 mg/dL — ABNORMAL HIGH (ref 70–99)
Potassium: 3.9 mmol/L (ref 3.5–5.1)
Sodium: 138 mmol/L (ref 135–145)

## 2020-05-30 NOTE — Progress Notes (Signed)
Speech Language Pathology Daily Session Note  Patient Details  Name: [name] MRN: [id number] Date of Birth: 1961/08/03  Today's Date: 05/30/2020 SLP Individual Time: 0730-0826 SLP Individual Time Calculation (min): 56 min  Short Term Goals: Week 4: SLP Short Term Goal 1 (Week 4): STG=LTG's (ELOS of discharge on 9/22) SLP Short Term Goal 1 - Progress (Week 4): Other (comment)  Skilled Therapeutic Interventions: Skilled SLP intervention focused on dysphagia and cognition. Pt very lethergic and needed mod-max A with tactile and verbal cues to keep eyes open. Pt fed for 2-3 bites then req'd tactile cues to use right hand to initiate feeing herself and taking sips of water. Pt tolerated dys 2 breakfast and thin liquids with no overt s/sx of aspiration or penetration. Max A for initiation with cognitive tasks. SLP removed right hand from behind head and placed cards in patients hand for card sorting task. Mod A with verbal cues and visual cues to instruct pt to turn head to locate cards in left visual field. Pt able to recall DC date for Wednesday and responded to simple questions regarding DC plan and caregiver once home. Pt left upright in bed with call bell and all needs within reach. Bed alarm set. Cont with therapy per plan of care.      Pain Pain Assessment Pain Scale: Faces Faces Pain Scale: No hurt  Therapy/Group: Individual Therapy  Darrol Poke Wynne Jury 05/30/2020, 8:26 AM

## 2020-05-30 NOTE — Plan of Care (Signed)
  Problem: Consults Goal: RH STROKE PATIENT EDUCATION Description: See Patient Education module for education specifics  Outcome: Progressing Goal: Nutrition Consult-if indicated Outcome: Progressing Goal: Diabetes Guidelines if Diabetic/Glucose > 140 Description: If diabetic or lab glucose is > 140 mg/dl - Initiate Diabetes/Hyperglycemia Guidelines & Document Interventions  Outcome: Progressing   Problem: RH BOWEL ELIMINATION Goal: RH STG MANAGE BOWEL WITH ASSISTANCE Description: STG Manage Bowel with Mod I Assistance. Outcome: Progressing Goal: RH STG MANAGE BOWEL W/MEDICATION W/ASSISTANCE Description: STG Manage Bowel with Medication with Mod I Assistance. Outcome: Progressing   Problem: RH BLADDER ELIMINATION Goal: RH STG MANAGE BLADDER WITH ASSISTANCE Description: STG Manage Bladder With Mod I Assistance Outcome: Progressing   Problem: RH SKIN INTEGRITY Goal: RH STG SKIN FREE OF INFECTION/BREAKDOWN Description: Mod I Outcome: Progressing   Problem: RH SAFETY Goal: RH STG ADHERE TO SAFETY PRECAUTIONS W/ASSISTANCE/DEVICE Description: STG Adhere to Safety Precautions With Mod I Assistance/Device. Outcome: Progressing   Problem: RH PAIN MANAGEMENT Goal: RH STG PAIN MANAGED AT OR BELOW PT'S PAIN GOAL Description: <2 Outcome: Progressing   Problem: RH KNOWLEDGE DEFICIT Goal: RH STG INCREASE KNOWLEDGE OF HYPERTENSION Description: Patient will be able to manage HTN with medications and dietary restrictions using handouts and educational information independently Outcome: Progressing Goal: RH STG INCREASE KNOWLEDGE OF STROKE PROPHYLAXIS Description: Patient will acknowledge understanding of secondary stroke risks and management with stroke prophylaxis medications using handouts and educational materials independently Outcome: Progressing

## 2020-05-30 NOTE — Progress Notes (Signed)
Physical Therapy Session Note  Patient Details  Name: [name] MRN: [id number] Date of Birth: Jan 21, 1961  Today's Date: 05/30/2020 PT Individual Time: 1000-1100 PT Individual Time Calculation (min): 60 min   Short Term Goals: Week 4:  PT Short Term Goal 1 (Week 4): STG = LTG due to ELOS  Skilled Therapeutic Interventions/Progress Updates:    Pt received supine in bed, daughter [name], at bedside. Pt agreeable to PT session, reports unrated L hip pain. Pt was drowsy and had trouble staying awake throughout session. Focus of session to perform TIS w/c eval with Josh from [employer]. Noted brief to be very saturated with both urine and BM on arrival. Required totalA for pericare, donning clean brief and pants. Encouraged her to roll during this to improve functional bed mobility but she shows limited initiation and required maxA for rolling L<>R. Supine<>sit with maxA with HOB flat with cues for technique and initiation. Sliding board transfer with maxA from EOB to TIS w/c and required maxA for repositioning. WC transport with totalA for time management to ortho gym. Therapist collaborated with [facility] during w/c eval to assist with providing her proper positioning, pressure relief, and comfort to ensure adaptive needs met. Pt transported back to her room with totalA and remained seated in w/c at end of session with daughter present, seat belt alarm on, LUE positioned with body pillow.   Therapy Documentation Precautions:  Precautions Precautions: Fall, Other (comment) Precaution Comments: L hemi, pushing tendency with RUE to weak L side Restrictions Weight Bearing Restrictions: No  Therapy/Group: Individual Therapy  Vianny Schraeder P Linsey Hirota PT 05/30/2020, 12:15 PM

## 2020-05-30 NOTE — Progress Notes (Signed)
Occupational Therapy Weekly Progress Note  Patient Details  Name: [name] MRN: [id number] Date of Birth: 03-13-1961  Beginning of progress report period: May 20, 2020 End of progress report period: May 30, 2020  Today's Date: 05/30/2020 OT Individual Time: 8270-7867 OT Individual Time Calculation (min): 38 min    Patient has met 0 of 3 short term goals.  Pt is making slow progress towards goals therefore recent downgrading of LTGs.  OT sessions have focused on caregiver education to ensure pt safely transitions to home setting with dtrs as primary caretakers.  Pts dtrs eager to learn and exhibit good follow through of trained skills provided during OT sessions including bed level self care and hoyer lift transfers.  Pt barriers over the past few OT sessions has been increased drowsiness with reduced pt ability to participate and significant pain in left hip adductor region as well as deficits listed below.     Patient continues to demonstrate the following deficits: muscle weakness, decreased cardiorespiratoy endurance, impaired timing and sequencing, motor apraxia, decreased coordination and decreased motor planning, decreased visual perceptual skills and decreased visual motor skills, decreased midline orientation, decreased attention to left, left side neglect, decreased motor planning and ideational apraxia, decreased initiation, decreased attention, decreased awareness, decreased problem solving, decreased safety awareness, decreased memory and delayed processing and decreased sitting balance, decreased standing balance, decreased postural control, hemiplegia and decreased balance strategies and therefore will continue to benefit from skilled OT intervention to enhance overall performance with BADL.  Patient progressing toward long term goals..  Continue plan of care.  OT Short Term Goals Week 3:  OT Short Term Goal 1 (Week 3): Pt will be able to self correct posture  with mod cues and min A when she leans too far to the L. OT Short Term Goal 2 (Week 3): Pt will don shirt with mod A. OT Short Term Goal 3 (Week 3): pt will complete slide board transfer with max A of 1 to increase potential for toilet transfers. Week 4:  OT Short Term Goal 1 (Week 4): STGs=LTGs dt ELOS  Skilled Therapeutic Interventions/Progress Updates:    Pt sitting up in w/c sleeping.  Pt reports upon waking,"Im so sleepy, its hard to stay awake".  Pt very difficult to arouse throughout session despite multimodal cueing and multisensory stimulation.  Pt transported to sinkside due to reporting she has not brushed her teeth since lunch.  Max cueing to keep pt alert and participate in brushing of teeth.  Pt needing max assist to place toothpaste on toothbrush, and turn faucet on.  Pt brushed teeth with frequent VCs to stay awake and on task.  Pt then attempted participation during functional reach activity in large gym to scan and match cards on board placed in front.  Pt frequently falling asleep despite multimodal cueing.  Pt able to match 5 cards successfully needing mod assist to initiate forward leaning and to prevent excessive forward/left leaning. Session discontinued secondary to pts poor arousal level despite therapeutic efforts.   Pt returned to room, pt requesting to stay up in chair in posterior tilt position.  Call bell in reach, seat belt alarm on.    Therapy Documentation Precautions:  Precautions Precautions: Fall, Other (comment) Precaution Comments: L hemi, pushing tendency with RUE to weak L side Restrictions Weight Bearing Restrictions: No   Therapy/Group: Individual Therapy  Ezekiel Slocumb 05/30/2020, 2:41 PM

## 2020-05-30 NOTE — Progress Notes (Addendum)
[place] PHYSICAL MEDICINE & REHABILITATION PROGRESS NOTE  Subjective/Complaints: No complaints this morning She is excited that she gets to go home in 2 days! Daughter is at bedside and has no questions BP is elevated  ROS: Denies CP, SOB, N/V/D  Objective: Vital Signs: Blood pressure (!) 142/86, pulse (!) 56, temperature 97.6 F (36.4 C), resp. rate 17, height 5\' 3"  (1.6 m), weight 67.2 kg, last menstrual period 09/15/2013, SpO2 100 %. No results found. Recent Labs    05/30/20 0633  WBC 5.6  HGB 9.3*  HCT 29.4*  PLT PLATELET CLUMPS NOTED ON SMEAR, UNABLE TO ESTIMATE   Recent Labs    05/30/20 0633  NA 138  K 3.9  CL 106  CO2 23  GLUCOSE 113*  BUN 13  CREATININE 1.57*  CALCIUM 9.6    Physical Exam: BP (!) 142/86 (BP Location: Right Arm)    Pulse (!) 56    Temp 97.6 F (36.4 C)    Resp 17    Ht 5\' 3"  (1.6 m)    Wt 67.2 kg    LMP 09/15/2013    SpO2 100%    BMI 26.24 kg/m  General: Alert and oriented x 3, No apparent distress HEENT: Head is normocephalic, atraumatic, PERRLA, EOMI, sclera anicteric, oral mucosa pink and moist, dentition intact, ext ear canals clear,  Neck: Supple without JVD or lymphadenopathy Heart: Reg rate and rhythm. No murmurs rubs or gallops Chest: CTA bilaterally without wheezes, rales, or rhonchi; no distress Abdomen: Soft, non-tender, non-distended, bowel sounds positive. Extremities: No clubbing, cyanosis, or edema. Pulses are 2+ Skin: Clean and intact without signs of breakdown MSK mild left hip pain with PROM int ext rotation and abd add   L side too weak for cerebellar testing , + dysarthria   Motor: LUE: Proximally 0/5, handgrip 2+/5 with apraxia, unchanged LLE: 0/5 proximal distal, unchanged Increased tone noted LUE/LLE, improved No pain with gentle LLE ROM     Assessment/Plan: 1. 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  Physiatrist is providing close team supervision and 24 hour management of active medical problems listed below.  Physiatrist and rehab team continue to assess barriers to discharge/monitor patient progress toward functional and medical goals  Care Tool:  Bathing    Body parts bathed by patient: Left arm, Chest, Abdomen, Face, Right arm   Body parts bathed by helper: Left upper leg, Right upper leg, Buttocks, Front perineal area, Right lower leg, Left lower leg     Bathing assist Assist Level: Maximal Assistance - Patient 24 - 49%     Upper Body Dressing/Undressing Upper body dressing   What is the patient wearing?: Bra, Pull over shirt    Upper body assist Assist Level: Maximal Assistance - Patient 25 - 49%    Lower Body Dressing/Undressing Lower body dressing      What is the patient wearing?: Incontinence brief, Pants     Lower body assist Assist for lower body dressing: Dependent - Patient 0%     Toileting Toileting    Toileting assist Assist for toileting: Dependent - Patient 0%     Transfers Chair/bed transfer  Transfers assist     Chair/bed transfer assist level: 2 Helpers     Locomotion Ambulation   Ambulation assist   Ambulation activity did not occur: Safety/medical concerns          Walk 10 feet activity  Assist  Walk 10 feet activity did not occur: Safety/medical concerns        Walk 50 feet activity   Assist Walk 50 feet with 2 turns activity did not occur: Safety/medical concerns         Walk 150 feet activity   Assist Walk 150 feet activity did not occur: Safety/medical concerns         Walk 10 feet on uneven surface  activity   Assist Walk 10 feet on uneven surfaces activity did not occur: Safety/medical concerns         Wheelchair     Assist Will patient use wheelchair at discharge?: Yes Type of Wheelchair: Manual Wheelchair activity did not occur: Safety/medical concerns          Wheelchair 50 feet with 2 turns activity    Assist    Wheelchair 50 feet with 2 turns activity did not occur: Safety/medical concerns       Wheelchair 150 feet activity     Assist Wheelchair 150 feet activity did not occur: Safety/medical concerns          Medical Problem List and Plan: 1.Left side hemiparesis, 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  Continue CIR  WHO/PRAFO nightly  Fluoxetine ordered on 9/16 2. Antithrombotics: -DVT/anticoagulation:Lovenox -antiplatelet therapy: Completed 3 weeks of DAPT on 9/15, now on aspirin alone 3. Pain Management:Tylenol and Advil as needed for groin and knee pain, voltaren gel for knee pain  Warm compress left groin, continue   Hip x-ray negative   Continue muscle rub with benefit   Encouraged aggressive stretching with therapies   See #14   MRI ordered-mild hip add/abd strain   9/20: well controlled 4. Mood:Provide emotional support  Amantadine with breakfast and lunch started on 8/25  Per daughter, somnolence is baseline with patient with inconsistent/erratic sleep/wake patterns, often falling asleep if not engaged in activities prior to admission.   Klonopin 0.25mg  daily prn added for anxiety.  -antipsychotic agents: N/A 5. Neuropsych: This patientis not fully of making decisions on herown behalf. 6. Skin/Wound Care:Routine skin checks.  7. Fluids/Electrolytes/Nutrition:Routine in and outs.  8. Hypertension:   Hydralazine - increased to 100mg  TID on 8/26, Norvasc 10 mg daily.   Imdur 30mg    Clonidine 0.1mg  BID, increased to 0.2 BID on 9/16  Added metoprolol 12.5 mg BID, increased to 25 on 9/9 Vitals:   05/29/20 1950 05/30/20 0251  BP: (!) 166/101 (!) 142/86  Pulse: 64 (!) 56  Resp: 17 17  Temp: 98.5 F (36.9 C) 97.6 F (36.4 C)  SpO2: 100% 100%  HR controlled still labile, elevated BP may need to increase clonidine if this persists  9/20:  BP elevated but heart rate low- continue to monitor.   Monitor with increased mobility 9. Hyperlipidemia. Continue Lipitor 10. AKI on CKD. Normal renal function 2017 but no recent visits, does not have a PCP.   Cr.  1.66 on 9/17, down to 1.57 on 9/20 11. Leukocytosis: Resolved 12. 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  Continue to advance as tolerated 14. Spasticity- worse in LLE   Dantrolene DC'd on 9/14   Baclofen 5 twice daily started 9/14, increased to 3 times daily on 9/15, increased to 10 3 times daily on 9/16  Improving on 9/17, however will need to monitor for daytime lethargy/somnolence closely.  Discussed with therapies-will decrease baclofen if symptoms persistent 15.  Tooth pain:   Outpatient dentist follow-up.  15. Hypokalemia: normalized  16.  Acute blood loss anemia  Hemoglobin 10.4 on 9/13, 9.3 on 9/20 17.  Slow transit constipation  Bowel meds increased on 9/9, again on 9/10  Improving 18. Nausea/vomitting: Resolved 19. Abdominal pain: Ordered KUB which showed mild colonic stool  Resolved 20. GERD: Protonix. CXR stable.   21.  Hypokalemia  Potassium 3.2 on 9/17, 3.9 on 9/20  Supplemented x2 days  LOS: 27 days A FACE TO FACE EVALUATION WAS PERFORMED  Shasta Chinn P Izola Teague 05/30/2020, 1:03 PM

## 2020-05-30 NOTE — Discharge Summary (Addendum)
Physician Discharge Summary  Patient ID: [name] MRN: [id number] DOB/AGE: 58-07-1961 58 y.o.  Admit date: 05/03/2020 Discharge date: 06/01/2020  Discharge Diagnoses:  Principal Problem:   CVA (cerebral vascular accident) [facility]) Active Problems:   Pressure injury of skin   Thrombocytosis (HCC)   Leukocytosis   AKI (acute kidney injury) ([place])   Benign essential HTN   Lethargy   Dysphagia, post-stroke   Spastic hemiparesis (HCC)   Pain   Slow transit constipation   Acute blood loss anemia   Left inguinal pain   Labile blood pressure GERD  Discharged Condition: Stable  Significant Diagnostic Studies: DG Abd 1 View  Result Date: 05/23/2020 CLINICAL DATA:  Abdominal pain, mid to lower EXAM: ABDOMEN - 1 VIEW COMPARISON:  None. FINDINGS: No dilated small bowel loops. Mild colonic stool. No evidence of pneumatosis or pneumoperitoneum. Surgical clips are seen in the right upper quadrant of the abdomen. Clear lung bases. Lower lumbar spondylosis. IMPRESSION: Nonobstructive bowel gas pattern. Electronically Signed   By: Ilona Sorrel M.D.   On: 05/23/2020 11:22   CT HEAD WO CONTRAST  Result Date: 05/02/2020 CLINICAL DATA:  Status post right ACA infarct EXAM: CT HEAD WITHOUT CONTRAST TECHNIQUE: Contiguous axial images were obtained from the base of the skull through the vertex without intravenous contrast. COMPARISON:  04/27/2020 FINDINGS: Brain: Geographic area of decreased attenuation is noted in the distribution of the distal aspect of the right anterior cerebral artery similar to that seen on recent MRI consistent with subacute infarct. Basal ganglia calcifications are seen. Mild chronic white matter ischemic changes are seen as well. Stable right thalamic lacunar infarct is noted as well. No acute hemorrhage or new focal infarct is seen. Vascular: No hyperdense vessel or unexpected calcification. Skull: Normal. Negative for fracture or focal lesion. Sinuses/Orbits: Mucosal  retention cyst is noted in the left maxillary antrum stable from the prior study. Other: None. IMPRESSION: Geographic area of decreased attenuation in the distribution of the right anterior cerebral artery consistent with the ischemia seen on recent MRI. No new focal abnormality is seen. Electronically Signed   By: Inez Catalina M.D.   On: 05/02/2020 17:56   MR PELVIS WO CONTRAST  Result Date: 05/28/2020 CLINICAL DATA:  Pelvic pain. EXAM: MRI PELVIS WITHOUT CONTRAST TECHNIQUE: Multiplanar multisequence MR imaging of the pelvis was performed. No intravenous contrast was administered. COMPARISON:  None. FINDINGS: Bones: No hip fracture, dislocation or avascular necrosis. No periosteal reaction or bone destruction. No aggressive osseous lesion. Normal sacrum and sacroiliac joints. No SI joint widening or erosive changes. Degenerative disc disease with disc height loss at L5-S1. minimal grade 1 anterolisthesis of L4 on L5 secondary to moderate bilateral facet arthropathy. Bilateral foraminal narrowing at L4-5 with mild spinal stenosis. Articular cartilage and labrum Articular cartilage:  No chondral defect. Labrum: Grossly intact, but evaluation is limited by lack of intraarticular fluid. Joint or bursal effusion Joint effusion:  No hip joint effusion.  No SI joint effusion. Bursae:  No bursa formation. Muscles and tendons Flexors: Normal. Extensors: Normal. Abductors: Normal. Adductors: Mild muscle edema in the left adductor musculature likely reflecting muscle strain versus myositis. Gluteals: Mild muscle edema in the gluteus minimus and medius muscle bilaterally which may reflect mild myositis or muscle strain, left greater than right. Hamstrings: Normal. Other findings No pelvic free fluid. No fluid collection or hematoma. No inguinal lymphadenopathy. No inguinal hernia. Soft tissue edema in the subcutaneous fat superficial to the gluteus maximus muscle bilaterally likely reflecting contusions or fat  necrosis.  IMPRESSION: 1. No hip fracture, dislocation or avascular necrosis. 2. Mild muscle edema in the left adductor musculature likely reflecting muscle strain versus myositis. 3. Mild muscle edema in the gluteus minimus and medius muscle bilaterally which may reflect mild myositis or muscle strain, left greater than right. 4. Soft tissue edema in the subcutaneous fat superficial to the gluteus maximus muscle bilaterally likely reflecting contusions or fat necrosis. Electronically Signed   By: Kathreen Devoid   On: 05/28/2020 09:58   DG CHEST PORT 1 VIEW  Result Date: 05/22/2020 CLINICAL DATA:  Persistent cough EXAM: PORTABLE CHEST 1 VIEW COMPARISON:  09/19/2015 FINDINGS: No focal opacity or pleural effusion. Mild cardiomegaly. Aortic atherosclerosis. No pneumothorax. Skin fold artifact over the right lower chest. IMPRESSION: No active disease.  Mild cardiomegaly. Electronically Signed   By: Donavan Foil M.D.   On: 05/22/2020 19:47   DG HIP UNILAT WITH PELVIS 2-3 VIEWS LEFT  Result Date: 05/18/2020 CLINICAL DATA:  Left groin pain. EXAM: DG HIP (WITH OR WITHOUT PELVIS) 2-3V LEFT COMPARISON:  None. FINDINGS: There is no evidence of hip fracture or dislocation. There is no evidence of arthropathy or other focal bone abnormality. IMPRESSION: Negative. Electronically Signed   By: Kathreen Devoid   On: 05/18/2020 10:39    Labs:  Basic Metabolic Panel: Recent Labs  Lab 05/27/20 0627 05/30/20 0633  NA 135 138  K 3.2* 3.9  CL 104 106  CO2 22 23  GLUCOSE 97 113*  BUN 18 13  CREATININE 1.66* 1.57*  CALCIUM 9.5 9.6    CBC: Recent Labs  Lab 05/30/20 0633  WBC 5.6  NEUTROABS 3.7  HGB 9.3*  HCT 29.4*  MCV 89.9  PLT PLATELET CLUMPS NOTED ON SMEAR, UNABLE TO ESTIMATE    CBG: No results for input(s): GLUCAP in the last 168 hours.  Family history.  Mother with cancer of unknown father with hypertension and CAD brother with CAD.  Negative for CVA colon cancer or esophageal cancer  Brief HPI:   [name] is a 59 y.o. right-handed female with history of hypertension, CKD not on medication.  Lives with her 36 year old son and 2 adult daughters.  1 level home 2 steps to entry.  Independent prior to admission working at [employer].  Presented 04/27/2020 left side weakness and dysarthria.  Systolic blood pressure 485 diastolic 462.  Admission chemistries potassium 2.9 BUN 26 creatinine 1.81 alcohol negative urine drug screen negative.  CT of the head showed hypoattenuation focus in the right thalamus age likely reflecting sequela of lacunar type infarct likely remote though in the absence of comparison overall age-indeterminate.  Patient did not receive TPA.  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  Severe chronic small vessel ischemia.  CT angiogram of head and neck severe right A2 segment narrowing correlating with acute infarct of the preceding MRI.  50% right cavernous ICA stenosis 55% left paraclinoid ICA stenosis.  Echocardiogram with ejection fraction of 70% grade 2 diastolic dysfunction.  Maintained on Plavix aspirin x3 weeks and aspirin alone.  Subcutaneous Lovenox for DVT prophylaxis.  Patient did have a mild bump in creatinine 2.21 received fluid bolus.  Permissive hypertension closely monitored.  Patient was admitted for a comprehensive rehab program.   Hospital Course: KAMERAN LALLIER was admitted to rehab 05/03/2020 for inpatient therapies to consist of PT, ST and OT at least three hours five days a week. Past admission physiatrist, therapy team and rehab RN have worked together to provide  customized collaborative inpatient rehab.  Pertaining to patient's right ACA infarction remained stable aspirin and Plavix x3 weeks then aspirin alone.  Subcutaneous Lovenox for DVT prophylaxis no bleeding episodes.  Patient would follow neurology services.  Blood pressure controlled and monitored on Norvasc as well as clonidine with hydralazine and Lopressor and Imdur 30 mg daily.  No dizziness or  headache noted.  Patient would follow-up outpatient primary MD.  Bouts of spasticity related to CVA doing well with the addition of baclofen.  AKI on CKD renal function stabilized latest creatinine 1.66.  Bouts of constipation resolved with laxative assistance.  GERD with Protonix as indicated.  Mood stabilization with Prozac as well as Klonopin as needed and emotional support provided.   Blood pressures were monitored on TID basis and controlled      Rehab course: During patient's stay in rehab weekly team conferences were held to monitor patient's progress, set goals and discuss barriers to discharge. At admission, patient required +2 physical assist stand pivot transfers +2 physical assist squat pivot transfers.  Moderate assist upper body bathing +2 physical assist lower body bathing max assist upper body dressing total assist lower body dressing  Physical exam.  Blood pressure 166/78 pulse 108 temperature 98 respirations 18 oxygen saturation 1% room air Constitutional.  Awake alert no acute distress noted dysarthria HEENT Head.  Normocephalic and atraumatic Neck.  Supple nontender no JVD without thyromegaly Cardiac regular rate rhythm without any extra sounds or murmur heard Abdomen.  Soft nontender positive bowel sounds without rebound Respiratory effort normal no respiratory distress without wheeze Musculoskeletal.  Normal range of motion. 0/5 left upper left lower extremity   /She  has had improvement in activity tolerance, balance, postural control as well as ability to compensate for deficits. Marlana Salvage has had improvement in functional use RUE/LUE  and RLE/LLE as well as improvement in awareness.  Moderate assist to turn from side to side to don pants.  Required verbal cues for hook lying position.  Required max assist for right side lying to sit transfers.  Requires moderate assist for rolling with moderate to max cues for technique.  Requires total assist for threading of left lower  extremity and able to assist some with right lower extremity.  Max assist +2 for slide board transfers.  Needed minimal assist to forward lean away from wheelchair back rest.  Completed task of copying a pattern by placing multicolored eggs in a row matching picture placed above.  Speech therapy follow-up mod max assist for problem solving ongoing family teaching.  Full family teaching completed plan discharge to home       Disposition: Discharge to home    Diet: Dysphagia #2 thin liquids  Special Instructions: No driving smoking or alcohol  Medications at discharge 1.  Tylenol as needed 2.  Amantadine 100 mg p.o. twice daily 3.  Norvasc 10 mg p.o. daily 4.  Aspirin 325 mg p.o. daily 5.  Lipitor 40 mg p.o. daily 6.  Baclofen 10 mg p.o. BID 7.  Klonopin 0.25 mg p.o. daily as needed anxiety 8.  Clonidine 0.2 mg p.o. twice daily 9.  Voltaren gel 2 g 4 times daily 10.  Prozac 10 mg p.o. daily 11.  Hydralazine 100 mg p.o. every 8 hours 12.  Imdur 60 mg p.o. daily 13.  Melatonin 3 mg p.o. nightly 14.  Lopressor 25 mg p.o. twice daily 15.  Protonix 40 mg p.o. daily 16.  MiraLAX twice daily hold for loose stools 17.  Senokot S2  tabs p.o. twice daily 18.  Carafate 1 g p.o. 3 times daily with meals and bedtime  30-35 minutes were spent completing discharge summary and discharge planning  Discharge Instructions     Ambulatory referral to Neurology   Complete by: As directed    An appointment is requested in approximately 4 weeks right ACA infarction   Ambulatory referral to Physical Medicine Rehab   Complete by: As directed    Moderate complexity follow-up 1 to 2 weeks right ACA infarction        Follow-up Information     Jamse Arn, MD Follow up.   Specialty: Physical Medicine and Rehabilitation Why: Office to call for appointment Contact information: [address] [place] [address] [phone]         Argentina Donovan, PA-C Follow up on  06/08/2020.   Specialty: Family Medicine Why: Appointment 9:30 AM Contact information: [place] [address] [phone]                 Signed: Cathlyn Parsons 06/01/2020, 4:54 AM Patient was seen, face-face, and physical exam performed by me on day of discharge, greater than 30 minutes of total time spent.. Please see progress note from day of discharge as well.  Delice Lesch, MD, ABPMR

## 2020-05-31 ENCOUNTER — Inpatient Hospital Stay (HOSPITAL_COMMUNITY): Payer: 59

## 2020-05-31 ENCOUNTER — Inpatient Hospital Stay (HOSPITAL_COMMUNITY): Payer: 59 | Admitting: Occupational Therapy

## 2020-05-31 MED ORDER — FLUOXETINE HCL 10 MG PO CAPS: 10.0000 mg | ORAL_CAPSULE | Freq: Every day | ORAL | 3 refills | Status: DC

## 2020-05-31 MED ORDER — PANTOPRAZOLE SODIUM 40 MG PO PACK: 40.0000 mg | PACK | Freq: Two times a day (BID) | ORAL | 0 refills | Status: DC

## 2020-05-31 MED ORDER — AMANTADINE HCL 50 MG/5ML PO SYRP: 100.0000 mg | ORAL_SOLUTION | Freq: Two times a day (BID) | ORAL | 0 refills | Status: AC

## 2020-05-31 MED ORDER — METOPROLOL TARTRATE 25 MG PO TABS: 25.0000 mg | ORAL_TABLET | Freq: Two times a day (BID) | ORAL | 0 refills | Status: DC

## 2020-05-31 MED ORDER — BACLOFEN 10 MG PO TABS: 10.0000 mg | ORAL_TABLET | Freq: Three times a day (TID) | ORAL | 0 refills | Status: DC

## 2020-05-31 MED ORDER — POLYETHYLENE GLYCOL 3350 17 G PO PACK: 17.0000 g | PACK | Freq: Two times a day (BID) | ORAL | 0 refills | Status: AC

## 2020-05-31 MED ORDER — SUCRALFATE 1 GM/10ML PO SUSP: 1.0000 g | Freq: Three times a day (TID) | ORAL | 0 refills | Status: DC

## 2020-05-31 MED ORDER — CLONAZEPAM 0.5 MG PO TABS: 0.2500 mg | ORAL_TABLET | Freq: Two times a day (BID) | ORAL | 0 refills | Status: DC

## 2020-05-31 MED ORDER — ISOSORBIDE MONONITRATE ER 30 MG PO TB24: 30.0000 mg | ORAL_TABLET | Freq: Every day | ORAL | 0 refills | Status: DC

## 2020-05-31 MED ORDER — MELATONIN 3 MG PO TABS: 3.0000 mg | ORAL_TABLET | Freq: Every day | ORAL | 0 refills | Status: AC

## 2020-05-31 MED ORDER — CLONAZEPAM 0.25 MG PO TBDP: 0.2500 mg | ORAL_TABLET | Freq: Every day | ORAL | 0 refills | Status: DC | PRN

## 2020-05-31 MED ORDER — ASPIRIN 325 MG PO TABS: 325.0000 mg | ORAL_TABLET | Freq: Every day | ORAL | Status: DC

## 2020-05-31 MED ORDER — AMLODIPINE BESYLATE 10 MG PO TABS: 10.0000 mg | ORAL_TABLET | Freq: Every day | ORAL | 0 refills | Status: AC

## 2020-05-31 MED ORDER — CLONIDINE HCL 0.2 MG PO TABS: 0.2000 mg | ORAL_TABLET | Freq: Two times a day (BID) | ORAL | 11 refills | Status: DC

## 2020-05-31 MED ORDER — SUCRALFATE 1 G PO TABS: 1.0000 g | ORAL_TABLET | Freq: Four times a day (QID) | ORAL | 1 refills | Status: DC

## 2020-05-31 MED ORDER — BACLOFEN 10 MG PO TABS: 10.0000 mg | ORAL_TABLET | Freq: Two times a day (BID) | ORAL | 0 refills | Status: AC

## 2020-05-31 MED ORDER — SUCRALFATE 1 G PO TABS: 1.0000 g | ORAL_TABLET | Freq: Four times a day (QID) | ORAL | 1 refills | Status: AC

## 2020-05-31 MED ORDER — BACLOFEN 10 MG PO TABS
10.0000 mg | ORAL_TABLET | Freq: Two times a day (BID) | ORAL | Status: DC
  Administered 2020-05-31 – 2020-06-01 (×2): 10 mg via ORAL
  Filled 2020-05-31 (×2): qty 1

## 2020-05-31 MED ORDER — DICLOFENAC SODIUM 1 % EX GEL: 2.0000 g | Freq: Four times a day (QID) | CUTANEOUS | 0 refills | Status: DC

## 2020-05-31 MED ORDER — FERROUS SULFATE 325 (65 FE) MG PO TABS: 325.0000 mg | ORAL_TABLET | Freq: Two times a day (BID) | ORAL | 3 refills | Status: AC

## 2020-05-31 MED ORDER — OMEPRAZOLE 40 MG PO CPDR: 40.0000 mg | DELAYED_RELEASE_CAPSULE | Freq: Two times a day (BID) | ORAL | 0 refills | Status: AC

## 2020-05-31 MED ORDER — ATORVASTATIN CALCIUM 40 MG PO TABS: 40.0000 mg | ORAL_TABLET | Freq: Every day | ORAL | 0 refills | Status: DC

## 2020-05-31 MED ORDER — HYDRALAZINE HCL 100 MG PO TABS: 100.0000 mg | ORAL_TABLET | Freq: Three times a day (TID) | ORAL | 0 refills | Status: AC

## 2020-05-31 MED ORDER — ACETAMINOPHEN 325 MG PO TABS: 650.0000 mg | ORAL_TABLET | ORAL | Status: AC | PRN

## 2020-05-31 NOTE — Progress Notes (Signed)
Occupational Therapy Discharge Summary  Patient Details  Name: [name] MRN: [id number] Date of Birth: 1961-04-08  Today's Date: 05/31/2020 OT Individual Time: 1751-0258 OT Individual Time Calculation (min): 53 min    Patient has met 10 of 18 long term goals due to improved activity tolerance, improved balance, postural control, improved attention, improved awareness and improved coordination.  Patient to discharge at [facility] Max Assist level.  Patient's care partner is independent to provide the necessary physical and cognitive assistance at discharge.    Skilled Intervention:  Pt sitting up in TIS w/c, agreeable to OT session, however presented with lability and confusion throughout session with frequent bouts of tearfulness throughout.  Pt completed sinkside bathing and dressing UB needing max assist and max multimodal cues to sequence through steps and attend to task and left side of body.  Pt attempted sit to stand at sinkside however needing +2 total assist and unable to come to full stance x 2 trials despite max multimodal cues.  Pt reports its too early in the morning and that she is "so tired".  Re-oriented pt to time of day (afternoon) and provided emotional support and encouragement to improve pt participation.  Pt requesting back to bed.  Pt needing +2 total assist sliding board transfer w/c to EOB.  Max assist sit to supine.  Max assist to roll right and min-mod assist to roll left using max cues to encourage pt participation and pt needing dependent pericare and clothing mgt secondary to soiled brief of urine.  Pt falling asleep frequently near end of session, therefore discontinued due to pt with poor ability to follow simple one step commands. Nurse made aware of pt presentation during session.  Call bell in reach, bed alarm on.  Reasons goals not met: Barriers include significant cognitive impairments preventing pt from ability to carryover skilled training and poor attention  for ability to follow commands, significant lethargy decreasing pt's ability to participate fully and accompanied by additional deficits including left hemiplegia, significant pain in left hip adductor region, impaired visual tracking, left sided neglect, and impaired righting reactions and postural control.  Recommendation:  Patient will benefit from ongoing skilled OT services in home health setting to continue to advance functional skills in the area of BADL, left sided attention, functional cognitive skills, trunk strength, and righting reactions, functional transfers.  Equipment: drop arm BSC  Reasons for discharge: lack of progress toward goals and discharge from hospital  Patient/family agrees with progress made and goals achieved: Yes  OT Discharge Precautions/Restrictions  Precautions Precautions: Fall Precaution Comments: L hemiplegia, L inattention, pushing tendency with RUE to weak L side Restrictions Weight Bearing Restrictions: No Pain Pain Assessment Pain Scale: 0-10 Pain Score: 10-Worst pain ever Pain Type: Chronic pain Pain Location: Hip Pain Orientation: Left Pain Onset: On-going Pain Intervention(s): Ambulation/increased activity;RN made aware;Repositioned;MD notified (Comment);Rest Multiple Pain Sites: No ADL ADL Eating: Moderate assistance Where Assessed-Eating: Wheelchair Grooming: Moderate assistance Where Assessed-Grooming: Wheelchair Upper Body Bathing: Moderate assistance Where Assessed-Upper Body Bathing: Wheelchair Lower Body Bathing: Maximal assistance Where Assessed-Lower Body Bathing: Chair Upper Body Dressing: Moderate assistance Where Assessed-Upper Body Dressing: Wheelchair Lower Body Dressing: Dependent Where Assessed-Lower Body Dressing: Chair Toileting: Dependent Where Assessed-Toileting: Glass blower/designer: Dependent (2  helpers, Warehouse manager) Armed forces technical officer Method: Other (comment) (slide board) Science writer: Drop  arm bedside commode Vision Baseline Vision/History: Wears glasses Wears Glasses: Reading only Patient Visual Report: No change from baseline Vision Assessment?: Vision impaired- to be further tested in functional  context Additional Comments: Able to visually track across midline but unable to maintain L gaze without max cueing. Head rotated R with combination of L lateral flexion. Perception  Perception: Impaired Inattention/Neglect: Does not attend to left side of body;Does not attend to left visual field Praxis Praxis: Impaired Praxis Impairment Details: Ideomotor;Motor planning;Perseveration;Initiation;Ideation Praxis-Other Comments: Significant praxis with difficulty initiating functional mobility without facilitation Cognition Overall Cognitive Status: Impaired/Different from baseline Arousal/Alertness: Lethargic (Possibly to combination of tone medications + poor sleep cycles at night) Orientation Level: Oriented X4 Attention: Focused;Sustained Focused Attention: Impaired Focused Attention Impairment: Functional basic;Verbal basic Sustained Attention: Impaired Sustained Attention Impairment: Functional basic;Verbal basic Selective Attention: Impaired Selective Attention Impairment: Functional basic;Verbal basic Memory: Impaired Memory Impairment: Storage deficit;Retrieval deficit;Decreased recall of new information;Decreased short term memory Decreased Short Term Memory: Verbal basic;Functional basic Problem Solving: Impaired Problem Solving Impairment: Functional basic;Verbal basic Behaviors: Perseveration;Lability Safety/Judgment: Impaired Sensation Sensation Light Touch: Impaired by gross assessment Hot/Cold: Appears Intact Proprioception: Impaired by gross assessment Additional Comments: Difficult to assess 2/2 fluctuating alertness and lethargy, limited attention level Coordination Gross Motor Movements are Fluid and Coordinated: No Fine Motor Movements are Fluid  and Coordinated: No Motor  Motor Motor: Hemiplegia;Motor apraxia;Abnormal tone;Abnormal postural alignment and control Motor - Skilled Clinical Observations: flaccid L HB plegia, LUE tone in elbow, shldr, and digits. Mobility  Bed Mobility Bed Mobility: Rolling Right;Rolling Left;Supine to Sit;Sit to Supine Rolling Right: Maximal Assistance - Patient 25-49% Rolling Left: Minimal Assistance - Patient > 75% Supine to Sit: Maximal Assistance - Patient - Patient 25-49% Sit to Supine: Maximal Assistance - Patient 25-49% Transfers Sit to Stand: 2 Helpers Stand to Sit: 2 Helpers  Trunk/Postural Assessment  Cervical Assessment Cervical Assessment: Exceptions to [facility] (right cervical rotation and left cervical lat flexion) Thoracic Assessment Thoracic Assessment: Exceptions to [facility] (kyphosis) Lumbar Assessment Lumbar Assessment:  (sacral sitting and posterior pelvic tilt) Postural Control Postural Control: Deficits on evaluation Trunk Control: unable to correct without manual facilitation Righting Reactions: delayed; excessively leans to left and posteriorly needing max VCs visual cues and mod assist to correct  Balance Balance Balance Assessed: Yes Static Sitting Balance Static Sitting - Level of Assistance: 3: Mod assist (fluctuates based on lethargy and attention; brief periods of CGA otherwise needing mod assist) Dynamic Sitting Balance Dynamic Sitting - Level of Assistance: 2: Max assist Sitting balance - Comments: Requires max/totalA for sitting balance while performing functional tasks such as donning shirt. Demo's posterior and L lateral lean with inability to correct Static Standing Balance Static Standing - Level of Assistance: 1: +2 Total assist Extremity/Trunk Assessment RUE Assessment RUE Assessment: Within Functional Limits General Strength Comments: Grossly 4-/5 LUE Assessment LUE Assessment: Exceptions to [facility] General Strength Comments: grossly 0/5 LUE Body System:  Neuro Brunstrum levels for arm and hand: Arm;Hand Brunstrum level for arm: Stage I Presynergy Brunstrum level for hand: Stage I Flaccidity LUE Tone LUE Tone: Mild   Airen Stiehl L Cebert Dettmann 05/31/2020, 9:32 AM

## 2020-05-31 NOTE — Discharge Instructions (Signed)
Inpatient Rehab Discharge Instructions  [place] Discharge date and time: No discharge date for patient encounter.   Activities/Precautions/ Functional Status: Activity: activity as tolerated Diet: Dysphagia #2 thin liquids Wound Care: none needed Functional status:  ___ No restrictions     ___ Walk up steps independently ___ 24/7 supervision/assistance   ___ Walk up steps with assistance ___ Intermittent supervision/assistance  ___ Bathe/dress independently ___ Walk with walker     _x__ Bathe/dress with assistance ___ Walk Independently    ___ Shower independently ___ Walk with assistance    ___ Shower with assistance ___ No alcohol     ___ Return to work/school ________  Special Instructions: No driving smoking or alcohol  Continue aspirin 325 mg daily and Plavix 75 mg daily x3 weeks then aspirin alone   COMMUNITY REFERRALS UPON DISCHARGE:    Home Health:   PT, OT, RN, SP, Edgar Equipment/Items Ordered:HOSPITAL BED, HOYER LIFT, DROP-ARM BEDSIDE COMMODE AND WHEELCHAIR-LOANER TO BE DELIVERED TO THE HOME                                                 Agency/Supplier:[facility] [phone]    STROKE/TIA DISCHARGE INSTRUCTIONS SMOKING Cigarette smoking nearly doubles your risk of having a stroke & is the single most alterable risk factor  If you smoke or have smoked in the last 12 months, you are advised to quit smoking for your health.  Most of the excess cardiovascular risk related to smoking disappears within a year of stopping.  Ask you doctor about anti-smoking medications  [place] Quit Line: 1-800-QUIT NOW  Free Smoking Cessation Classes [phone]  CHOLESTEROL Know your levels; limit fat & cholesterol in your diet  Lipid Panel     Component Value Date/Time   CHOL 179 04/28/2020 0221   TRIG 45 04/28/2020 0221   HDL 88 04/28/2020 0221   CHOLHDL 2.0 04/28/2020 0221   VLDL  9 04/28/2020 0221   LDLCALC 82 04/28/2020 0221      Many patients benefit from treatment even if their cholesterol is at goal.  Goal: Total Cholesterol (CHOL) less than 160  Goal:  Triglycerides (TRIG) less than 150  Goal:  HDL greater than 40  Goal:  LDL (LDLCALC) less than 100   BLOOD PRESSURE American Stroke Association blood pressure target is less that 120/80 mm/Hg  Your discharge blood pressure is:  BP: (!) 163/79  Monitor your blood pressure  Limit your salt and alcohol intake  Many individuals will require more than one medication for high blood pressure  DIABETES (A1c is a blood sugar average for last 3 months) Goal HGBA1c is under 7% (HBGA1c is blood sugar average for last 3 months)  Diabetes: No known diagnosis of diabetes    Lab Results  Component Value Date   HGBA1C 5.9 (H) 04/28/2020     Your HGBA1c can be lowered with medications, healthy diet, and exercise.  Check your blood sugar as directed by your physician  Call your physician if you experience unexplained or low blood sugars.  PHYSICAL ACTIVITY/REHABILITATION Goal is 30 minutes at least 4 days per week  Activity: Increase activity slowly, Therapies: Physical Therapy: Home Health Return to work:  Activity decreases your risk of heart attack and stroke and makes your heart stronger.  It helps control your weight and blood pressure; helps you relax and can improve your mood.  Participate in a regular exercise program.  Talk with your doctor about the best form of exercise for you (dancing, walking, swimming, cycling).  DIET/WEIGHT Goal is to maintain a healthy weight  Your discharge diet is:  Diet Order            Diet Heart Room service appropriate? Yes; Fluid consistency: Thin  Diet effective now                 liquids Your height is:  Height: 5\' 3"  (160 cm) Your current weight is: Weight: 67.2 kg Your Body Mass Index (BMI) is:  BMI (Calculated): 26.25  Following the type of diet  specifically designed for you will help prevent another stroke.  Your goal weight range is:    Your goal Body Mass Index (BMI) is 19-24.  Healthy food habits can help reduce 3 risk factors for stroke:  High cholesterol, hypertension, and excess weight.  RESOURCES Stroke/Support Group:  Call [phone]   STROKE EDUCATION PROVIDED/REVIEWED AND GIVEN TO PATIENT Stroke warning signs and symptoms How to activate emergency medical system (call 911). Medications prescribed at discharge. Need for follow-up after discharge. Personal risk factors for stroke. Pneumonia vaccine given:  Flu vaccine given:  My questions have been answered, the writing is legible, and I understand these instructions.  I will adhere to these goals & educational materials that have been provided to me after my discharge from the hospital.      My questions have been answered and I understand these instructions. I will adhere to these goals and the provided educational materials after my discharge from the hospital.  Patient/Caregiver Signature _______________________________ Date __________  Clinician Signature _______________________________________ Date __________  Please bring this form and your medication list with you to all your follow-up doctor's appointments.

## 2020-05-31 NOTE — Progress Notes (Addendum)
Patient ID: [name], female   DOB: 1960-10-25, 59 y.o.   MRN: [id number]  Met with pt and [name]-daughter to see if equipment delivered or set up time for delivery. She reports spoke with Friday and have not heard anything since. Have messaged [employer] and will ask to look into. Need equipment delivered so pt can go home tomorrow. Will also message Zack-[employer] liaison to look into this.  9:56 AM Message from [employer] still awaiting for insurance auth, once received will set up delivery.

## 2020-05-31 NOTE — Progress Notes (Signed)
[place] PHYSICAL MEDICINE & REHABILITATION PROGRESS NOTE  Subjective/Complaints: Patient seen sitting up in a chair working with therapy this morning.  She states she slept well overnight.  Discussed lethargy with therapies.  She denies groin pain.  ROS: Denies CP, SOB, N/V/D  Objective: Vital Signs: Blood pressure (!) 141/117, pulse (!) 53, temperature 97.6 F (36.4 C), resp. rate 16, height 5\' 3"  (1.6 m), weight 67.2 kg, last menstrual period 09/15/2013, SpO2 96 %. No results found. Recent Labs    05/30/20 0633  WBC 5.6  HGB 9.3*  HCT 29.4*  PLT PLATELET CLUMPS NOTED ON SMEAR, UNABLE TO ESTIMATE   Recent Labs    05/30/20 0633  NA 138  K 3.9  CL 106  CO2 23  GLUCOSE 113*  BUN 13  CREATININE 1.57*  CALCIUM 9.6    Physical Exam: BP (!) 141/117 (BP Location: Right Arm)   Pulse (!) 53   Temp 97.6 F (36.4 C)   Resp 16   Ht 5\' 3"  (1.6 m)   Wt 67.2 kg   LMP 09/15/2013   SpO2 96%   BMI 26.24 kg/m   Constitutional: No distress . Vital signs reviewed. HENT: Normocephalic.  Atraumatic. Eyes: EOMI. No discharge. Cardiovascular: No JVD.  RRR. Respiratory: Normal effort.  No stridor.  Bilateral clear to auscultation. GI: Non-distended.  BS +. Skin: Warm and dry.  Intact. Psych: Flat.  Perseverative speech, improving Musc: No edema in extremities.  No tenderness in extremities. Neuro: Alert Left lean Motor: Left upper extremity: 0/5 shoulder abduction elbow flexion/extension, 2/5 handgrip Left lower extremity: 0/5 proximal distal Dysarthria No spasticity noted  Assessment/Plan: 1. 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  Physiatrist is providing close team supervision and 24 hour management of active medical problems listed below.  Physiatrist and rehab team continue to assess barriers to discharge/monitor patient progress toward functional and  medical goals  Care Tool:  Bathing    Body parts bathed by patient: Left arm, Chest, Abdomen, Face, Right arm   Body parts bathed by helper: Left upper leg, Right upper leg, Buttocks, Front perineal area, Right lower leg, Left lower leg     Bathing assist Assist Level: Maximal Assistance - Patient 24 - 49%     Upper Body Dressing/Undressing Upper body dressing   What is the patient wearing?: Bra, Pull over shirt    Upper body assist Assist Level: Maximal Assistance - Patient 25 - 49%    Lower Body Dressing/Undressing Lower body dressing      What is the patient wearing?: Incontinence brief, Pants     Lower body assist Assist for lower body dressing: Dependent - Patient 0%     Toileting Toileting    Toileting assist Assist for toileting: Dependent - Patient 0%     Transfers Chair/bed transfer  Transfers assist     Chair/bed transfer assist level: Maximal Assistance - Patient 25 - 49%     Locomotion Ambulation   Ambulation assist   Ambulation activity did not occur: Safety/medical concerns          Walk 10 feet activity   Assist  Walk 10 feet activity did not occur: Safety/medical concerns        Walk 50 feet activity   Assist Walk 50 feet with 2 turns activity did not occur: Safety/medical concerns         Walk 150 feet activity  Assist Walk 150 feet activity did not occur: Safety/medical concerns         Walk 10 feet on uneven surface  activity   Assist Walk 10 feet on uneven surfaces activity did not occur: Safety/medical concerns         Wheelchair     Assist Will patient use wheelchair at discharge?: Yes Type of Wheelchair: Manual Wheelchair activity did not occur: Safety/medical concerns  Wheelchair assist level: Maximal Assistance - Patient 25 - 49% Max wheelchair distance: 9ft    Wheelchair 50 feet with 2 turns activity    Assist    Wheelchair 50 feet with 2 turns activity did not occur:  Safety/medical concerns   Assist Level: Dependent - Patient 0%   Wheelchair 150 feet activity     Assist Wheelchair 150 feet activity did not occur: Safety/medical concerns   Assist Level: Dependent - Patient 0%      Medical Problem List and Plan: 1.Left side hemiparesis, 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  Continue CIR  WHO/PRAFO nightly  Fluoxetine ordered on 9/16 2. Antithrombotics: -DVT/anticoagulation:Lovenox -antiplatelet therapy: Completed 3 weeks of DAPT on 9/15, now on aspirin alone 3. Pain Management:Tylenol and Advil as needed for groin and knee pain, voltaren gel for knee pain  Warm compress left groin, continue   Hip x-ray negative   Continue muscle rub with benefit   Encouraged aggressive stretching with therapies   See #14   MRI ordered-mild hip add/abd strain   Improved on 9/21 4. Mood:Provide emotional support  Amantadine with breakfast and lunch started on 8/25  Per daughter, somnolence is baseline with patient with inconsistent/erratic sleep/wake patterns, often falling asleep if not engaged in activities prior to admission.   Klonopin 0.25mg  daily prn added for anxiety.  -antipsychotic agents: N/A 5. Neuropsych: This patientis not fully of making decisions on herown behalf. 6. Skin/Wound Care:Routine skin checks.  7. Fluids/Electrolytes/Nutrition:Routine in and outs.  8. Hypertension:   Hydralazine - increased to 100mg  TID on 8/26, Norvasc 10 mg daily.   Imdur 30mg    Clonidine 0.1mg  BID, increased to 0.2 BID on 9/16  Added metoprolol 12.5 mg BID, increased to 25 on 9/9 Vitals:   05/30/20 1928 05/31/20 0311  BP: (!) 160/81 (!) 141/117  Pulse: 60 (!) 53  Resp: 18 16  Temp: 98.2 F (36.8 C) 97.6 F (36.4 C)  SpO2: 100% 96%   Labile 9/21, monitor for trend  Monitor with increased mobility 9. Hyperlipidemia. Continue Lipitor 10. AKI on CKD. Normal renal function 2017 but  no recent visits, does not have a PCP.   Cr.  1.57 on 9/20  11. Leukocytosis: Resolved 12. 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  Continue to advance as tolerated 14. Spasticity-   Dantrolene DC'd on 9/14   Baclofen 5 twice daily started 9/14, increased to 3 times daily on 9/15, increased to 10 3 times daily on 9/16  Spasticity improved, however patient with increasing lethargy, baclofen decreased to 10 twice daily 15.  Tooth pain:   Outpatient dentist follow-up.  15. Hypokalemia: normalized  16.  Acute blood loss anemia  Hemoglobin 9.3 on 9/20 17.  Slow transit constipation  Bowel meds increased on 9/9, again on 9/10  Improving 18. Nausea/vomitting: Resolved 19. Abdominal pain: Ordered KUB which showed mild colonic stool  Resolved 20. GERD: Protonix. CXR stable.   21.  Hypokalemia  Potassium 3.9 on 9/20  Supplemented x2 days  LOS: 28 days A FACE  TO FACE EVALUATION WAS PERFORMED  Eddis Pingleton Lorie Phenix 05/31/2020, 11:28 AM

## 2020-05-31 NOTE — Discharge Summary (Signed)
Physical Therapy Discharge Summary  Patient Details  Name: [name] MRN: [id number] Date of Birth: 1961/08/14  Today's Date: 05/31/2020 PT Individual Time: 0800-0857 PT Individual Time Calculation (min): 57 min    Patient has met 1 of 11 long term goals due to improved balance and improved postural control.  Patient to discharge at a wheelchair level Max Assist.   Patient's care partner and family  is independent to provide the necessary physical and cognitive assistance at discharge.  Reasons goals not met: Unfortunately, pt's goals not met due to significance of L hemiplegia, L inattention, fluctuating activity tolerance and lethargy, L hip flexor/adductor pain, decreased problem solving and ability to compensate for deficits, impaired sustained attention.  Recommendation:  Patient will benefit from ongoing skilled PT services in home health setting to continue to advance safe functional mobility, address ongoing impairments in balance, transfers, strengthening, and caregiver training in order to minimize fall risk.  Equipment: Hospital bed, TIS w/c (ordered through [facility] provided in the interim), hoyer lift  Reasons for discharge: lack of progress toward goals  Patient/family agrees with progress made and goals achieved: Yes  PT Discharge Precautions/Restrictions Precautions Precautions: Fall Precaution Comments: L hemiplegia, L inattention, pushing tendency with RUE to weak L side Restrictions Weight Bearing Restrictions: No  Pain Pain Assessment Pain Scale: 0-10 Pain Score: 10-Worst pain ever Pain Type: Chronic pain Pain Location: Hip Pain Orientation: Left Pain Onset: On-going Pain Intervention(s): Ambulation/increased activity;RN made aware;Repositioned;MD notified (Comment);Rest Multiple Pain Sites: No Vision/Perception  Vision - Assessment Additional Comments: Able to visually track across midline but unable to maintain L gaze without max cueing.  Head rotated R with combination of L latera flexion Perception Perception: Impaired Inattention/Neglect: Does not attend to left side of body;Does not attend to left visual field Praxis Praxis: Impaired Praxis Impairment Details: Ideomotor;Motor planning;Perseveration;Initiation;Ideation Praxis-Other Comments: Significant praxis with difficulty initiating functional mobility without facilitation  Cognition Overall Cognitive Status: Impaired/Different from baseline Arousal/Alertness: Lethargic Attention: Focused;Sustained Focused Attention: Impaired Focused Attention Impairment: Functional basic;Verbal basic Sustained Attention: Impaired Sustained Attention Impairment: Functional basic;Verbal basic Selective Attention: Impaired Selective Attention Impairment: Functional basic;Verbal basic Memory: Impaired Memory Impairment: Storage deficit;Retrieval deficit;Decreased recall of new information;Decreased short term memory Decreased Short Term Memory: Verbal basic;Functional basic Problem Solving: Impaired Problem Solving Impairment: Functional basic;Verbal basic Behaviors: Perseveration;Lability Safety/Judgment: Impaired Sensation Sensation Light Touch: Impaired by gross assessment Additional Comments: Difficult to assess 2/2 fluctuating alertness and lethargy, limited attention level Coordination Gross Motor Movements are Fluid and Coordinated: No Fine Motor Movements are Fluid and Coordinated: No Motor  Motor Motor: Hemiplegia;Motor apraxia;Abnormal tone;Abnormal postural alignment and control Motor - Skilled Clinical Observations: flaccid L HB plegia, LUE tone in elbow, shldr, and digits.  Mobility Bed Mobility Bed Mobility: Rolling Right;Rolling Left;Supine to Sit;Sit to Supine Rolling Right: Maximal Assistance - Patient 25-49% Rolling Left: Maximal Assistance - Patient 25-49% Supine to Sit: Maximal Assistance - Patient - Patient 25-49% Sitting - Scoot to Edge of Bed: Total  Assistance - Patient < 25% Sit to Supine: Maximal Assistance - Patient 25-49% Transfers Transfers: Sit to Stand;Stand to Sit;Lateral/Scoot Transfers Sit to Stand: Total Assistance - Patient < 25% Stand to Sit: Total Assistance - Patient < 25% Stand Pivot Transfers: Total Assistance - Patient < 25% Stand Pivot Transfer Details: Verbal cues for sequencing;Verbal cues for technique;Verbal cues for precautions/safety;Visual cues/gestures for precautions/safety;Visual cues/gestures for sequencing;Manual facilitation for placement;Tactile cues for initiation;Tactile cues for sequencing;Tactile cues for posture;Tactile cues for placement;Verbal cues for safe use of DME/AE  Lateral/Scoot Transfers: Maximal Assistance - Patient 25-49% Transfer (Assistive device): Other (Comment) (sliding board) Locomotion  Gait Ambulation: No Gait Gait: No Stairs / Additional Locomotion Stairs: No Wheelchair Mobility Wheelchair Mobility: No  Trunk/Postural Assessment  Cervical Assessment Cervical Assessment: Exceptions to [facility] (R cervical rotation and L lateral cervical flexion) Thoracic Assessment Thoracic Assessment: Exceptions to [facility] (kyphosis) Lumbar Assessment Lumbar Assessment:  (sacral sitting and posterior pelvic tilt) Postural Control Postural Control: Deficits on evaluation (delayed, unable to correct without manual facilitation)  Balance Balance Balance Assessed: Yes Static Sitting Balance Static Sitting - Balance Support: Feet supported;Right upper extremity supported Static Sitting - Level of Assistance: 3: Mod assist (fluctuating performance based on lethargy and attention. Showed brief ability to sit with CGA but otherwise mod/maxA) Dynamic Sitting Balance Dynamic Sitting - Balance Support: Feet supported;During functional activity Dynamic Sitting - Level of Assistance: 2: Max assist Sitting balance - Comments: Requires max/totalA for sitting balance while performing functional tasks such as  donning shirt. Demo's posterior and L lateral lean with inability to correct Static Standing Balance Static Standing - Balance Support: During functional activity Static Standing - Level of Assistance: 1: +2 Total assist;Patient percentage (comment) (in standing frame, otherwise requires +2 with B knee blocking) Extremity Assessment  RUE Assessment RUE Assessment: Within Functional Limits General Strength Comments: Grossly 4-/5 LUE Assessment LUE Assessment: Exceptions to [facility] General Strength Comments: grossly 0/5 RLE Assessment RLE Assessment: Exceptions to [facility] General Strength Comments: Grossly 4-/5 LLE Assessment LLE Assessment: Exceptions to [facility] General Strength Comments: Grossly 0/5  Skilled Intervention: Pt received supine in bed, awake and agreeable to PT session, reports 10/10 pain in L hip. Rest breaks and mobility provided for pain management. RN and MD also notified during session. Donned pants with max/totalA while supine. Required maxA rolling L<>R with bed features. Supine<>sit with maxA, limited initiation by patient. Required maxA for siting balance, progressing to modA with RUE to bedrail. Sliding board transfer with maxA towards weaker L side from EOB to w/c. WC transport with totalA to ortho gym and performed sliding board with maxA towards stronger R side from w/c to mat table. Worked on sitting balance including targeted reaching with RUE but required maxA as she kept falling asleep. Unable to improve her alertness level despite max multimodal cueing. Sliding board transfer with [employer] back to her w/c and transported back to her room where she remained reclined in TIS w/c with LUE support with pillow, belt alarm on, needs in reach, daughter at bedside.  Brendan Gadson P Kassey [place] PT 05/31/2020, 12:05 PM

## 2020-05-31 NOTE — Progress Notes (Signed)
Speech Language Pathology Discharge Summary  Patient Details  Name: [name] MRN: [id number] Date of Birth: 1960/12/03  Today's Date: 05/31/2020 SLP Individual Time: 1000-1055 SLP Individual Time Calculation (min): 55 min   Skilled Therapeutic Interventions: Skilled SLP intervention focused on cognition and caregiver education with daughter [name]). Daughter was given 4 functional and structured tasks to complete with patient while home to address left inattention. Daughter educated on external aids and strategies to increase patients attention during tasks such as eliminating distractions and short simple instructions. Daughter demonstrated good understanding of dc recommendations for cogntion and pt's diet followinf dc home (dys 2 and thin). Pt left seated upright in wheelchair with daughter present at bedside. Chair alarm set. Pt dc'ing home tomorrow.     Patient has met 3 of 6 long term goals.  Patient to discharge at [facility] level.  Reasons goals not met: Requires mod A for attention and pt being dc'd on dys 2 diet.   Clinical Impression/Discharge Summary:   Pt has made slow but consistent gains and has met 3 out of 6 LTG's this admission due to improved speech intelligibility, improved problem solving with basic functional tasks and improved awareness of physical and cognitive deficits. Pt is currently overall Min-mod A for cognitive tasks and tolerating dys 2 diet and thin liquids well with no overt s/sx of aspiration or penetration. Daughter demonstrates good understanding of tasks to complete with patient once home to improve left inattention, memory, awareness and reminders to give patient to increase attention to items and visual  information in left visual field. Pt and family education completed and pt will discharge home with 24 hour supervision from family. Pt will benefit from home health follow-up SLP services to maximize attention, problem solving, and swallow  function for overall functional independence.  Care Partner:  Caregiver Able to Provide Assistance: Yes  Type of Caregiver Assistance: Cognitive;Physical  Recommendation:  Home Health SLP;24 hour supervision/assistance  Rationale for SLP Follow Up: Maximize functional communication;Maximize cognitive function and independence;Maximize swallowing safety;Reduce caregiver burden   Equipment:     Reasons for discharge: Treatment goals met   Patient/Family Agrees with Progress Made and Goals Achieved: Yes    Darrol Poke Vesta [place] 05/31/2020, 12:54 PM

## 2020-05-31 NOTE — Progress Notes (Signed)
Patient ID: [name], female   DOB: Jan 26, 1961, 59 y.o.   MRN: [id number] Spoke with [facility] to check the authorization. She has changed the request to urgent and gave pending auth of [id number], but confirmation will be made end of today or tomorrow and sent to [employer] to set up delivery of equipment. Will inform pt of this plan.

## 2020-05-31 NOTE — Plan of Care (Signed)
  Problem: Consults Goal: RH STROKE PATIENT EDUCATION Description: See Patient Education module for education specifics  Outcome: Progressing Goal: Nutrition Consult-if indicated Outcome: Progressing Goal: Diabetes Guidelines if Diabetic/Glucose > 140 Description: If diabetic or lab glucose is > 140 mg/dl - Initiate Diabetes/Hyperglycemia Guidelines & Document Interventions  Outcome: Progressing   Problem: RH BOWEL ELIMINATION Goal: RH STG MANAGE BOWEL WITH ASSISTANCE Description: STG Manage Bowel with Mod I Assistance. Outcome: Progressing Goal: RH STG MANAGE BOWEL W/MEDICATION W/ASSISTANCE Description: STG Manage Bowel with Medication with Mod I Assistance. Outcome: Progressing   Problem: RH BLADDER ELIMINATION Goal: RH STG MANAGE BLADDER WITH ASSISTANCE Description: STG Manage Bladder With Mod I Assistance Outcome: Progressing   Problem: RH SKIN INTEGRITY Goal: RH STG SKIN FREE OF INFECTION/BREAKDOWN Description: Mod I Outcome: Progressing   Problem: RH SAFETY Goal: RH STG ADHERE TO SAFETY PRECAUTIONS W/ASSISTANCE/DEVICE Description: STG Adhere to Safety Precautions With Mod I Assistance/Device. Outcome: Progressing   Problem: RH PAIN MANAGEMENT Goal: RH STG PAIN MANAGED AT OR BELOW PT'S PAIN GOAL Description: <2 Outcome: Progressing   Problem: RH KNOWLEDGE DEFICIT Goal: RH STG INCREASE KNOWLEDGE OF HYPERTENSION Description: Patient will be able to manage HTN with medications and dietary restrictions using handouts and educational information independently Outcome: Progressing Goal: RH STG INCREASE KNOWLEDGE OF STROKE PROPHYLAXIS Description: Patient will acknowledge understanding of secondary stroke risks and management with stroke prophylaxis medications using handouts and educational materials independently Outcome: Progressing

## 2020-05-31 NOTE — Progress Notes (Signed)
Patient ID: [name], female   DOB: 1961/02/03, 59 y.o.   MRN: [id number]  Have gotten medication coverage straightened out still waiting for authorization for DME and will need this delivered prior to pt going home. Both daughter and pt aware of this and are in agreement. Will remain hopeful can be resolved and equipment delivered so pt can go home tomorrow.

## 2020-06-01 DIAGNOSIS — I63012 Cerebral infarction due to thrombosis of left vertebral artery: Secondary | ICD-10-CM

## 2020-06-01 MED ORDER — ISOSORBIDE MONONITRATE ER 30 MG PO TB24
30.0000 mg | ORAL_TABLET | Freq: Once | ORAL | Status: AC
  Administered 2020-06-01: 30 mg via ORAL
  Filled 2020-06-01: qty 1

## 2020-06-01 MED ORDER — ISOSORBIDE MONONITRATE ER 60 MG PO TB24: 60.0000 mg | ORAL_TABLET | Freq: Every day | ORAL | 0 refills | Status: AC

## 2020-06-01 MED ORDER — ISOSORBIDE MONONITRATE ER 30 MG PO TB24: 60.0000 mg | ORAL_TABLET | Freq: Every day | ORAL | Status: DC

## 2020-06-01 NOTE — Progress Notes (Signed)
Brief note:  Please see progress note from earlier today as well.  Ultimately, family and patient decided to discharge home and await for equipment at home.  Family feels trained in capable of caring for patient until DME arrives.  > 30 minutes spent in total in discharge planning between myself and PA regarding aforementioned, as well discussion regarding DME equipment, follow-up appointments, follow-up therapies, discharge medications, discharge recommendations

## 2020-06-01 NOTE — Progress Notes (Addendum)
[place] PHYSICAL MEDICINE & REHABILITATION PROGRESS NOTE  Subjective/Complaints: Patient seen sitting up in bed this morning.  She states she slept well overnight.  Lethargy appears to be improving.  She states she is looking forward to discharge today.  However, discussed with team, DME has not arrived-which she will need for discharge.  ROS: Denies CP, SOB, N/V/D  Objective: Vital Signs: Blood pressure (!) 162/75, pulse 62, temperature 98.3 F (36.8 C), resp. rate 20, height 5\' 3"  (1.6 m), weight 67.2 kg, last menstrual period 09/15/2013, SpO2 99 %. No results found. Recent Labs    05/30/20 0633  WBC 5.6  HGB 9.3*  HCT 29.4*  PLT PLATELET CLUMPS NOTED ON SMEAR, UNABLE TO ESTIMATE   Recent Labs    05/30/20 0633  NA 138  K 3.9  CL 106  CO2 23  GLUCOSE 113*  BUN 13  CREATININE 1.57*  CALCIUM 9.6    Physical Exam: BP (!) 162/75 (BP Location: Right Arm)   Pulse 62   Temp 98.3 F (36.8 C)   Resp 20   Ht 5\' 3"  (1.6 m)   Wt 67.2 kg   LMP 09/15/2013   SpO2 99%   BMI 26.24 kg/m   Constitutional: No distress . Vital signs reviewed. HENT: Normocephalic.  Atraumatic. Eyes: EOMI. No discharge. Cardiovascular: No JVD.  RRR. Respiratory: Normal effort.  No stridor.  Bilateral clear to auscultation. GI: Non-distended.  BS +. Skin: Warm and dry.  Intact. Psych: Flat.  Perseverative speech. Musc: No edema in extremities.  No tenderness in extremities. Neuro: Alert Left lean Motor: Left upper extremity: 0/5 shoulder abduction elbow flexion/extension, 2/5 handgrip, unchanged Left lower extremity: 0/5 proximal distal, unchanged Dysarthria, unchanged No spasticity noted  Assessment/Plan: 1. 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  Physiatrist is providing close team supervision and 24 hour management of active medical problems listed  below.  Physiatrist and rehab team continue to assess barriers to discharge/monitor patient progress toward functional and medical goals  Care Tool:  Bathing    Body parts bathed by patient: Left arm, Chest, Abdomen, Face   Body parts bathed by helper: Right arm, Front perineal area, Buttocks, Right upper leg, Left upper leg, Left lower leg, Right lower leg     Bathing assist Assist Level: Maximal Assistance - Patient 24 - 49%     Upper Body Dressing/Undressing Upper body dressing   What is the patient wearing?: Pull over shirt    Upper body assist Assist Level: Maximal Assistance - Patient 25 - 49%    Lower Body Dressing/Undressing Lower body dressing      What is the patient wearing?: Incontinence brief, Pants     Lower body assist Assist for lower body dressing: Dependent - Patient 0%     Toileting Toileting    Toileting assist Assist for toileting: Dependent - Patient 0%     Transfers Chair/bed transfer  Transfers assist     Chair/bed transfer assist level: Maximal Assistance - Patient 25 - 49%     Locomotion Ambulation   Ambulation assist   Ambulation activity did not occur: Safety/medical concerns          Walk 10 feet activity   Assist  Walk 10 feet activity did not occur: Safety/medical concerns        Walk 50 feet activity   Assist Walk 50 feet with 2 turns activity did not occur: Safety/medical  concerns         Walk 150 feet activity   Assist Walk 150 feet activity did not occur: Safety/medical concerns         Walk 10 feet on uneven surface  activity   Assist Walk 10 feet on uneven surfaces activity did not occur: Safety/medical concerns         Wheelchair     Assist Will patient use wheelchair at discharge?: Yes Type of Wheelchair: Manual Wheelchair activity did not occur: Safety/medical concerns  Wheelchair assist level: Maximal Assistance - Patient 25 - 49% Max wheelchair distance: 51ft     Wheelchair 50 feet with 2 turns activity    Assist    Wheelchair 50 feet with 2 turns activity did not occur: Safety/medical concerns   Assist Level: Dependent - Patient 0%   Wheelchair 150 feet activity     Assist Wheelchair 150 feet activity did not occur: Safety/medical concerns   Assist Level: Dependent - Patient 0%      Medical Problem List and Plan: 1.Left side hemiparesis, 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  Continue CIR  WHO/PRAFO nightly  Fluoxetine ordered on 9/16  Patient was seen today additionally for tilt in space manual wheelchair with power tilt assessment. I have reviewed and agree with PT evaluation.   Team conference today to discuss current and goals and coordination of care, home and environmental barriers, and discharge planning with nursing, case manager, and therapies. Please see conference note from today as well.  2. Antithrombotics: -DVT/anticoagulation:Lovenox -antiplatelet therapy: Completed 3 weeks of DAPT on 9/15, now on aspirin alone 3. Pain Management:Tylenol and Advil as needed for groin and knee pain, voltaren gel for knee pain  Warm compress left groin, continue   Hip x-ray negative   Continue muscle rub with benefit   Encouraged aggressive stretching with therapies   See #14   MRI ordered-mild hip add/abd strain   Improved on 9/22 4. Mood:Provide emotional support  Amantadine with breakfast and lunch started on 8/25  Per daughter, somnolence is baseline with patient with inconsistent/erratic sleep/wake patterns, often falling asleep if not engaged in activities prior to admission.   Klonopin 0.25mg  daily prn added for anxiety.  -antipsychotic agents: N/A 5. Neuropsych: This patientis not fully of making decisions on herown behalf. 6. Skin/Wound Care:Routine skin checks.  7. Fluids/Electrolytes/Nutrition:Routine in and outs.  8. Hypertension:   Hydralazine  - increased to 100mg  TID on 8/26, Norvasc 10 mg daily.   Imdur 30mg , increased to 60 on 9/22  Clonidine 0.1mg  BID, increased to 0.2 BID on 9/16  Added metoprolol 12.5 mg BID, increased to 25 on 9/9 Vitals:   06/01/20 0403 06/01/20 0405  BP: (!) 187/83 (!) 162/75  Pulse: 61 62  Resp: 20   Temp: 98.3 F (36.8 C)   SpO2: 97% 99%   Elevated on 9/22  Monitor with increased mobility 9. Hyperlipidemia. Continue Lipitor 10. AKI on CKD. Normal renal function 2017 but no recent visits, does not have a PCP.   Cr.  1.57 on 9/20  11. Leukocytosis: Resolved 12. 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  Continue to advance as tolerated 14. Spasticity-   Dantrolene DC'd on 9/14   Baclofen 5 twice daily started 9/14, increased to 3 times daily on 9/15, increased to 10 3 times daily on 9/16, decreased to twice daily on 9/21 due to lethargy  Controlled on 9/22  15.  Tooth pain:   Outpatient dentist  follow-up.  15. Hypokalemia: normalized  16.  Acute blood loss anemia  Hemoglobin 9.3 on 9/20 17.  Slow transit constipation  Bowel meds increased on 9/9, again on 9/10  Improving 18. Nausea/vomitting: Resolved 19. Abdominal pain: Ordered KUB which showed mild colonic stool  Resolved 20. GERD: Protonix. CXR stable.   21.  Hypokalemia  Potassium 3.9 on 9/20  Supplemented x2 days  LOS: 29 days A FACE TO FACE EVALUATION WAS PERFORMED  [name] [name] 06/01/2020, 9:59 AM

## 2020-06-01 NOTE — Progress Notes (Addendum)
Patient ID: [name], female   DOB: [birth year], 59 y.o.   MRN: [id number] Continue to work on authorization for her equipment and delivery. Has not been approved yet, pt and daughter aware of this. Informed MD and PA, will need equipment delivered before can be discharged home. Wheelchair will be delivered by 1:30, daughter aware.  9:30 AM Daughter feels they can manage pt at home without the equipment for a day or two. She is taking some of pt's things home and then will return will discuss more and see what [employer] has found out. May move forward with discharge home today. Check with MD

## 2020-06-01 NOTE — Progress Notes (Signed)
Patient ID: [name], female   DOB: [birth year], 59 y.o.   MRN: [id number]  Contacted [facility] to enquire about the equipment was told by Wille Glaser it has ben expedited and a decision should be made today or tomorrow. Have let daughter-[name] know regarding this.

## 2020-06-01 NOTE — Plan of Care (Signed)
  Problem: Consults Goal: RH STROKE PATIENT EDUCATION Description: See Patient Education module for education specifics  Outcome: Progressing Goal: Nutrition Consult-if indicated Outcome: Progressing Goal: Diabetes Guidelines if Diabetic/Glucose > 140 Description: If diabetic or lab glucose is > 140 mg/dl - Initiate Diabetes/Hyperglycemia Guidelines & Document Interventions  Outcome: Progressing   Problem: RH BOWEL ELIMINATION Goal: RH STG MANAGE BOWEL WITH ASSISTANCE Description: STG Manage Bowel with Mod I Assistance. Outcome: Progressing Goal: RH STG MANAGE BOWEL W/MEDICATION W/ASSISTANCE Description: STG Manage Bowel with Medication with Mod I Assistance. Outcome: Progressing   Problem: RH BLADDER ELIMINATION Goal: RH STG MANAGE BLADDER WITH ASSISTANCE Description: STG Manage Bladder With Mod I Assistance Outcome: Progressing   Problem: RH SKIN INTEGRITY Goal: RH STG SKIN FREE OF INFECTION/BREAKDOWN Description: Mod I Outcome: Progressing

## 2020-06-01 NOTE — Progress Notes (Signed)
Patient ID: [name], female   DOB: 1961/07/29, 59 y.o.   MRN: [id number]  Daughter feels they can manage pt at home without hospital bed, hoyer and drop-arm bedside commode until hopefully comes tomorrow, or if next day. Daughter is a CNA in a NH and quite comfortable with her care and reports nursing does not use drop-am commode pt is wearing diapers. She has a bed on the first floor and her wheelchair will be delivered to home at 1:30pm. MD has agreed to allow pt to discharge home. AC aware and team made aware of this.

## 2020-06-01 NOTE — Progress Notes (Signed)
Inpatient Rehabilitation Care Coordinator  Discharge Note  The overall goal for the admission was met for:   Discharge location: Yes-HOME WITH SIGNIFICANT OTHER AND TWO DAUGHTER'S ASSISTING 24/7 HR CARE  Length of Stay: Yes-29 DAYS  Discharge activity level: Yes-MOD-MAX ASSIST LEVEL  Home/community participation: Yes  Services provided included: MD, RD, PT, OT, SLP, RN, CM, Pharmacy, Neuropsych and SW  Financial Services: Other: [facility]  Follow-up services arranged: Home Health: [facility]-PT,OT,SP,RN, DME: [facility]-WHEELCHAIR, HOSPITAL BED, DROP-ARM BEDSIDE COMMODE AND HOYER LIFT and Patient/Family has no preference for HH/DME agencies  Comments (or additional information):BOTH DAUGHTER'S [name] AND [name] WERE IN FOR FAMILY TRAINING AND FEEL COMFORTABLE WITH HER CARE. MD ALLOWED THEM TO DISCHARGE HOME WITHOUT YET RECEIVING HOYER LIFT, HOSPITAL BED AND DROP-ARM BEDSIDE COMMODE DUE TO INSURANCE DELAY IN APPROVAL. [name] WHO IS A CNA IN A NH FEELS COMFORTABLE WITH PT'S CARE. AMBULANCE TRANSPORT HOME BEDSIDE RN AWARE SCHEDULED FOR 1:30  Patient/Family verbalized understanding of follow-up arrangements: Yes  Individual responsible for coordination of the follow-up plan: [name]-DAUGHTER [phone]  Confirmed correct DME delivered: Elease Hashimoto 06/01/2020    Zakirah Weingart, Gardiner Rhyme

## 2020-06-01 NOTE — Patient Care Conference (Signed)
Inpatient RehabilitationTeam Conference and Plan of Care Update Date: 06/01/2020   Time: 11:11 AM    Patient Name: [name]      Medical Record Number: [id number]  Date of Birth: 18-Jul-1961 Sex: Female         Room/Bed: 4M07C/4M07C-01 Payor Info: Payor: [employer]  / Plan: [employer] / Product Type: *No Product type* /    Admit Date/Time:  05/03/2020  3:56 PM  Primary Diagnosis:  CVA (cerebral vascular accident) [facility])  Hospital Problems: Principal Problem:   CVA (cerebral vascular accident) ([place]) Active Problems:   Pressure injury of skin   Thrombocytosis (HCC)   Leukocytosis   AKI (acute kidney injury) ([place])   Benign essential HTN   Lethargy   Dysphagia, post-stroke   Spastic hemiparesis (HCC)   Pain   Slow transit constipation   Acute blood loss anemia   Left inguinal pain   Labile blood pressure    Expected Discharge Date: Expected Discharge Date: 06/01/20  Team Members Present: Physician leading conference: Dr. Delice Lesch Care Coodinator Present: Dorien Chihuahua, RN, BSN, CRRN;Other (comment) Jacqlyn Larsen Dupree, SW) Nurse Present: Other (comment) Dina Rich, RN) PT Present: Other (comment) (Christain Manhard, PT) OT Present: Leretha Pol, OT PPS Coordinator present : Gunnar Fusi, Novella Olive, PT     Current Status/Progress Goal Weekly Team Focus  Bowel/Bladder   incontinent of bowel and bladder. LBM 05/30/20  obtain complete continence of bowel and bladder  q shift and PRN toiletig assistance   Swallow/Nutrition/ Hydration             ADL's   total assist functional transfers, max assist UB self care, dependent LB self care  total assist to mod assist; goals partially met  dc planning, caregiver training, self care training, sitting balance and NMR, functional transfer training,   Mobility             Communication             Safety/Cognition/ Behavioral Observations            Pain   Patient c/o pain in BLE and L groin and  hip  Pain level is <3 with or iwthout activity  Q shift and PRN pain assessment   Skin   Patient has no skin issues  skin remain free of further breakdown/infection  Q shift and PRN skin assessment     Discharge Planning:      Team Discussion: AMS changes and memory deficits, fatigue and lethargy impairing progress.  Patient on target to meet rehab goals: yes  *See Care Plan and progress notes for long and short-term goals.   Revisions to Treatment Plan:   Teaching Needs: Family education completed  Current Barriers to Discharge: Decreased caregiver support and DME delivery  Possible Resolutions to Barriers: Follow up on delivery of DME recommended including special wheelchair.     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  Barriers to Discharge: Decreased family/caregiver support;Medical stability;Medication compliance;Behavior;Incontinence   Possible Resolutions to [employer] Focus: Therapies, optimize spasticity meds, optimize BP meds, continue wound care, advance diet as tolerated, follow labs, awaiting DME   Continued Need for Acute Rehabilitation Level of Care: The patient requires daily medical management by a physician with specialized training in physical medicine and rehabilitation for the following reasons: Direction of a multidisciplinary physical rehabilitation program to maximize functional independence : Yes Medical management of patient stability for increased activity during participation  in an intensive rehabilitation regime.: Yes Analysis of laboratory values and/or radiology reports with any subsequent need for medication adjustment and/or medical intervention. : Yes   I attest that I was present, lead the team conference, and concur with the assessment and plan of the team.   Dorien Chihuahua B 06/01/2020, 1:30 PM

## 2020-06-01 NOTE — Plan of Care (Signed)
Problem: Consults Goal: RH STROKE PATIENT EDUCATION Description: See Patient Education module for education specifics  06/01/2020 0927 by Mikki Harbor, RN Outcome: Completed/Met 06/01/2020 0852 by Mikki Harbor, RN Outcome: Progressing Goal: Nutrition Consult-if indicated 06/01/2020 0927 by Mikki Harbor, RN Outcome: Completed/Met 06/01/2020 0852 by Mikki Harbor, RN Outcome: Progressing Goal: Diabetes Guidelines if Diabetic/Glucose > 140 Description: If diabetic or lab glucose is > 140 mg/dl - Initiate Diabetes/Hyperglycemia Guidelines & Document Interventions  06/01/2020 0927 by Mikki Harbor, RN Outcome: Completed/Met 06/01/2020 0852 by Mikki Harbor, RN Outcome: Progressing   Problem: RH BOWEL ELIMINATION Goal: RH STG MANAGE BOWEL WITH ASSISTANCE Description: STG Manage Bowel with Mod I Assistance. 06/01/2020 0927 by Mikki Harbor, RN Outcome: Completed/Met 06/01/2020 0852 by Mikki Harbor, RN Outcome: Progressing Goal: RH STG MANAGE BOWEL W/MEDICATION W/ASSISTANCE Description: STG Manage Bowel with Medication with Mod I Assistance. 06/01/2020 0927 by Mikki Harbor, RN Outcome: Completed/Met 06/01/2020 0852 by Mikki Harbor, RN Outcome: Progressing   Problem: RH BLADDER ELIMINATION Goal: RH STG MANAGE BLADDER WITH ASSISTANCE Description: STG Manage Bladder With Mod I Assistance 06/01/2020 0927 by Mikki Harbor, RN Outcome: Completed/Met 06/01/2020 0852 by Mikki Harbor, RN Outcome: Progressing   Problem: RH SKIN INTEGRITY Goal: RH STG SKIN FREE OF INFECTION/BREAKDOWN Description: Mod I 06/01/2020 0927 by Mikki Harbor, RN Outcome: Completed/Met 06/01/2020 0852 by Mikki Harbor, RN Outcome: Progressing   Problem: RH SAFETY Goal: RH STG ADHERE TO SAFETY PRECAUTIONS W/ASSISTANCE/DEVICE Description: STG Adhere to Safety Precautions With Mod I Assistance/Device. 06/01/2020 0927 by Mikki Harbor,  RN Outcome: Completed/Met 06/01/2020 0852 by Mikki Harbor, RN Outcome: Progressing   Problem: RH PAIN MANAGEMENT Goal: RH STG PAIN MANAGED AT OR BELOW PT'S PAIN GOAL Description: <2 06/01/2020 0927 by Mikki Harbor, RN Outcome: Completed/Met 06/01/2020 0852 by Mikki Harbor, RN Outcome: Progressing   Problem: RH KNOWLEDGE DEFICIT Goal: RH STG INCREASE KNOWLEDGE OF HYPERTENSION Description: Patient will be able to manage HTN with medications and dietary restrictions using handouts and educational information independently 06/01/2020 0927 by Mikki Harbor, RN Outcome: Completed/Met 06/01/2020 0852 by Mikki Harbor, RN Outcome: Progressing Goal: RH STG INCREASE KNOWLEDGE OF STROKE PROPHYLAXIS Description: Patient will acknowledge understanding of secondary stroke risks and management with stroke prophylaxis medications using handouts and educational materials independently 06/01/2020 0927 by Mikki Harbor, RN Outcome: Completed/Met 06/01/2020 0852 by Mikki Harbor, RN Outcome: Progressing

## 2020-06-03 ENCOUNTER — Telehealth: Payer: Self-pay | Admitting: *Deleted

## 2020-06-03 NOTE — Telephone Encounter (Signed)
Transitional Care call--I spoke with [facility].   1. Are you/is patient experiencing any problems since coming home? Are there any questions regarding any aspect of care? No, just mainly waiting for equipment to be delivered. It has been approved but may arrive tonight or Monday.  It is hard to not have the bedside toilet and the lift.  They are doing the best they can. 2. Are there any questions regarding medications administration/dosing? Are meds being taken as prescribed? Patient should review meds with caller to confirm They have all medications but had to buy the tylenol and aspirin. They are asking for her Tramadol that she had in hospital. I have spoken with Algis Liming PA and she has not been on the medication for 10 days prior to discharge.  She was having some much lethargy she is concerned about giving it to her so defers to Dr Posey Pronto. 3. Have there been any falls? NO 4. Has Home Health been to the house and/or have they contacted you? If not, have you tried to contact them? Can we help you contact them? Yes ST is there now and OT has been out today. 5. Are bowels and bladder emptying properly? Are there any unexpected incontinence issues? If applicable, is patient following bowel/bladder programs? Yes bowel and bladder function is normal.  They do need the bedside toilet to be delivered. 6. Any fevers, problems with breathing, unexpected pain? No fevers, but having more pain in her leg and is asking for the tramadol. 7. Are there any skin problems or new areas of breakdown? NO 8. Has the patient/family member arranged specialty MD follow up (ie cardiology/neurology/renal/surgical/etc)?  Can we help arrange? Appt given for DR Posey Pronto and told about the PCP appt with Freeman Caldron. 9. Does the patient need any other services or support that we can help arrange? NO 10. Are caregivers following through as expected in assisting the patient? YES 11. Has the patient quit smoking, drinking alcohol, or  using drugs as recommended? N/A  Appointment Thursday 06/09/20 @11 :00 arrive by 10:40 to see Dr Adrian Prince to watch mail for packet from our office to fill out paperwork prior to visit. Address reviewed. Kern did call back and let [name] know that I spoke with Algis Liming about the tramadol but at this time she is not comfortable prescribing since she was lethargic while in the hospital and had not had the medication for the 10 days before discharge, so she suggested alternating heat and ice to see if more relief is obtained.

## 2020-06-06 ENCOUNTER — Telehealth: Payer: Self-pay | Admitting: Physician Assistant

## 2020-06-06 ENCOUNTER — Telehealth: Payer: Self-pay | Admitting: *Deleted

## 2020-06-06 ENCOUNTER — Telehealth: Payer: Self-pay

## 2020-06-06 ENCOUNTER — Telehealth: Payer: Self-pay | Admitting: Physical Medicine & Rehabilitation

## 2020-06-06 NOTE — Telephone Encounter (Addendum)
Pt has an appt with angela on 06-08-2020. Pt is still having leg pain and was given tramadol while in hospital. PT was discharge from [place] on 06-01-2020. Pt daughter courtney  has been giving her mother tylenol 500  Mg at least twice a day. Tylenol is not relieving the pain. [facility] outpt pharm. Please advise. Pt daughter is requesting tramadol or anything to relieve the pain

## 2020-06-06 NOTE — Telephone Encounter (Signed)
Shelda Pal [facility] called for POC for 1sk1, 2wk8.  Approval given.

## 2020-06-06 NOTE — Telephone Encounter (Signed)
She was taking 200mg  Advil up to three times per day while inpatient and felt this helped her groin and knee pain. Please advise her daughter that she can purchase this over the counter, and if this dose is not enough, she can take a stronger dose (up to 800mg  three times per day). Please advise her to stop taking if she develops abdominal pain. Would be good to eat non-sugar full-fat yogurt (1 serving per day) for probiotics while using this medication to protect her gut. Thanks Sybil!

## 2020-06-06 NOTE — Telephone Encounter (Signed)
Verbal okay given for OT twice a week for 4 weeks, then once a week for 4 weeks. Also a Copywriter, advertising for [employer].

## 2020-06-06 NOTE — Telephone Encounter (Addendum)
Dr Posey Pronto is out of the office. I discussed with [name] on Friday and she was not comfortable prescribing for her because of inpt excessive sleepiness.

## 2020-06-06 NOTE — Telephone Encounter (Signed)
Patient's daughter called states her mom is in a lot of pain and was given no pain meds going home, daughter has been giving her tylenol for the pain. Michela Pitcher it is having no effect on pain. Would like to see if there is something they can prescribe.

## 2020-06-06 NOTE — Telephone Encounter (Signed)
I spoke with the daughter and she says she talked to [facility] as well and he was going to send in some tramadol.

## 2020-06-07 ENCOUNTER — Telehealth: Payer: Self-pay | Admitting: *Deleted

## 2020-06-07 ENCOUNTER — Telehealth: Payer: Self-pay

## 2020-06-07 NOTE — Telephone Encounter (Signed)
Verbal approval given to [facility]: PT twice a week for 3 weeks & once a week for 5 weeks. Focus on endurance.

## 2020-06-07 NOTE — Telephone Encounter (Signed)
Lori RN called to get [facility] 3wk1 2wk2 1wk1 and 2 prn and requests MSW eval x2..  Could not leave message on unidentified VM. Requested she call back.

## 2020-06-08 ENCOUNTER — Other Ambulatory Visit: Payer: Self-pay

## 2020-06-08 ENCOUNTER — Encounter: Payer: 59 | Attending: Physical Medicine and Rehabilitation | Admitting: Physical Medicine and Rehabilitation

## 2020-06-08 ENCOUNTER — Telehealth: Payer: Self-pay | Admitting: Physician Assistant

## 2020-06-08 ENCOUNTER — Ambulatory Visit: Payer: 59 | Admitting: Physician Assistant

## 2020-06-08 ENCOUNTER — Encounter: Payer: Self-pay | Admitting: Physical Medicine and Rehabilitation

## 2020-06-08 VITALS — BP 146/84 | HR 62 | Temp 98.4°F | Ht 63.0 in | Wt 145.0 lb

## 2020-06-08 DIAGNOSIS — M25561 Pain in right knee: Secondary | ICD-10-CM | POA: Diagnosis not present

## 2020-06-08 DIAGNOSIS — G8110 Spastic hemiplegia affecting unspecified side: Secondary | ICD-10-CM | POA: Diagnosis not present

## 2020-06-08 DIAGNOSIS — I6300 Cerebral infarction due to thrombosis of unspecified precerebral artery: Secondary | ICD-10-CM | POA: Diagnosis present

## 2020-06-08 DIAGNOSIS — I69391 Dysphagia following cerebral infarction: Secondary | ICD-10-CM | POA: Insufficient documentation

## 2020-06-08 DIAGNOSIS — G8929 Other chronic pain: Secondary | ICD-10-CM | POA: Diagnosis present

## 2020-06-08 DIAGNOSIS — M25562 Pain in left knee: Secondary | ICD-10-CM | POA: Diagnosis present

## 2020-06-08 DIAGNOSIS — R0989 Other specified symptoms and signs involving the circulatory and respiratory systems: Secondary | ICD-10-CM | POA: Diagnosis not present

## 2020-06-08 MED ORDER — MELOXICAM POWD: 15.0000 mg | Freq: Every day | 3 refills | Status: DC | PRN

## 2020-06-08 NOTE — Progress Notes (Signed)
Subjective:    Patient ID: [name], female    DOB: [birth year], 59 y.o.   MRN: [id number]  HPI  [name] presents today for transitional care follow-up after CIR admission for stroke.  Her chief complaint today is left hip and groin pain. Her daughter has been caring for her and called earlier this week to discuss this pain. She had only been taking Tylenol and pain was uncontrolled. I advised at that time to use over the counter Advil, which she had been prescribed in the hospital and which helped her. This has been helping but not quite enough. Discussed today taking Meloxicam 56m for 2 weeks and patient and daughter are agreeable. Daughter requests liquid form given her dysphagia. She had hip imaging in CIR and it was showed adductor and abductor strain  Dysphagia: Has been following dysphagia diet.  Home therapies: She has been receiving home therapies. Was unable to tolerate her first PT session due to her pain and so has not yet stood or ambulated since she has been home.  PCP follow-up: She had follow-up scheduled with her PCP today but mixed her appointments with [place] them. Our staff is trying to help her reschedule her PCP follow-up. I advised her that I can help provide any refills she may need in the interim.   Pain Inventory Average Pain 7 Pain Right Now 8 My pain is intermittent, sharp and aching  LOCATION OF PAIN  Left leg & left groin  BOWEL Number of stools per week: 4-5 Oral laxative use No  Type of laxative none Enema or suppository use No  History of colostomy No  Incontinent Yes   BLADDER Pads In and out cath, frequency  Able to self cath No  Bladder incontinence Yes  Frequent urination Yes  Leakage with coughing No  Difficulty starting stream No  Incomplete bladder emptying No    Mobility how many minutes can you walk? not able to walk ability to climb steps?  no do you drive?  no use a wheelchair needs help with transfers Do you  have any goals in this area?  yes  Function not employed: date last employed Last worked 04/24/2020 - [employer]  I need assistance with the following:  feeding, dressing, bathing, toileting, meal prep, household duties and shopping Do you have any goals in this area?  yes  Neuro/Psych bladder control problems bowel control problems weakness numbness tingling trouble walking spasms dizziness depression anxiety  Prior Studies New patient  Physicians involved in your care NEW PATIENT   Family History  Problem Relation Age of Onset  . Cancer Mother   . Hypertension Father   . Heart disease Father   . Heart disease Brother   . Stroke Neg Hx    Social History   Socioeconomic History  . Marital status: Single    Spouse name: Not on file  . Number of children: Not on file  . Years of education: Not on file  . Highest education level: Not on file  Occupational History  . Occupation: works on loading dock  Tobacco Use  . Smoking status: Never Smoker  . Smokeless tobacco: Never Used  Vaping Use  . Vaping Use: Never used  Substance and Sexual Activity  . Alcohol use: Not Currently  . Drug use: Never  . Sexual activity: Not on file  Other Topics Concern  . Not on file  Social History Narrative   ** Merged History Encounter **  Social Determinants of Health   Financial Resource Strain:   . Difficulty of Paying Living Expenses: Not on file  Food Insecurity:   . Worried About Charity fundraiser in the Last Year: Not on file  . Ran Out of Food in the Last Year: Not on file  Transportation Needs:   . Lack of Transportation (Medical): Not on file  . Lack of Transportation (Non-Medical): Not on file  Physical Activity:   . Days of Exercise per Week: Not on file  . Minutes of Exercise per Session: Not on file  Stress:   . Feeling of Stress : Not on file  Social Connections:   . Frequency of Communication with Friends and Family: Not on file  . Frequency of  Social Gatherings with Friends and Family: Not on file  . Attends Religious Services: Not on file  . Active Member of Clubs or Organizations: Not on file  . Attends Archivist Meetings: Not on file  . Marital Status: Not on file   Past Surgical History:  Procedure Laterality Date  . CESAREAN SECTION     Past Medical History:  Diagnosis Date  . Arthritis   . Hypertension   . Hypertension    not taking medications   BP (!) 146/84   Pulse 62   Temp 98.4 F (36.9 C)   Ht 5' 3"  (1.6 m)   Wt 145 lb (65.8 kg)   LMP 09/15/2013   SpO2 98%   BMI 25.69 kg/m   Opioid Risk Score:   Fall Risk Score:  `1  Depression screen PHQ 2/9  Depression screen [facility] 2/9 06/08/2020 03/15/2016 02/14/2016 01/03/2016 12/06/2015 11/08/2015 10/25/2015  Decreased Interest 1 0 0 0 0 0 0  Down, Depressed, Hopeless 2 0 0 0 0 0 0  PHQ - 2 Score 3 0 0 0 0 0 0  Altered sleeping 2 - - - - - -  Tired, decreased energy 2 - - - - - -  Change in appetite 3 - - - - - -  Feeling bad or failure about yourself  1 - - - - - -  Trouble concentrating 0 - - - - - -  Moving slowly or fidgety/restless 1 - - - - - -  Suicidal thoughts 0 - - - - - -  PHQ-9 Score 12 - - - - - -   Review of Systems  Constitutional: Positive for appetite change.       Poor appetie  Respiratory: Positive for cough.   Gastrointestinal: Positive for constipation.  Genitourinary: Positive for frequency.  Musculoskeletal: Positive for gait problem.  Neurological: Positive for dizziness, weakness and numbness.       Tingling  Psychiatric/Behavioral: Negative for confusion.       Depression, anxiety       Objective:   Physical Exam Gen: no distress, normal appearing HEENT: oral mucosa pink and moist, poor dentition Cardio: Reg rate Chest: normal effort, normal rate of breathing Abd: soft, non-distended Ext: no edema Skin: intact Motor: Left upper extremity: 0/5 shoulder abduction elbow flexion/extension, 2/5 handgrip,  unchanged Left lower extremity: 0/5 proximal distal, unchanged Dysarthria, unchanged No spasticity noted Tenderness to palpation over left greater trochanteric bursa Bilateral knees swollen and tender. No erythema. Worse on left.  Psych: pleasant but flat affect, perseverative       Assessment & Plan:  [name] is a 59 year old woman who presents for transitional care follow-up following CIR admission for stroke.  1) CVA -Continue home PT, OT, SLP. She has received initial evaluations in all three areas.   2) Left hip and groin pain -Discussed results of hip MRI with patient and her daughter.  -Prescribed Meloxicam 67m for 14 days. Advised to take with yogurt, but patient does not like yogurt. Advised to stop medication if she experiences abdominal pain. Prescribed in liquid form given dysphagia. -If pain persists by next visit, can consider left greater trochanteric bursa injection next visit.   3) Dysphagia -Has been following D2 diet  4) Blood pressure -Well controlled this visit.   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 Advised that we can help her with any disability paperwork that she may need.   6) Missed PCP appointment today (mixed up with our appointment) -I advised that I can provide any refills she needs in the interim until she can follow-up with PCP. We are trying to help get this scheduled for her.  7) Spasticity: -Currently very well controlled.  8) Bilateral knee pain, swelling, and tenderness.  -Suspect OA. Daughter says she was on [place]in the past but has not followed with a rheumatologist in a while.  -Ordered bilateral XRs of her knees -Ordered rheum panel- will call patient once all results are available. So far RF is negative, ANA is pending, and ESR and CRP are elevated.   9) Cognitive deficits -Continue SLP -Continues to be very perseverative.   All questions answered. Transitional care appointment needed  given patient's severe cognitive deficits post-stroke, severe pain that is limiting her ability to stand, lack of follow-up with PCP.

## 2020-06-08 NOTE — Telephone Encounter (Signed)
I call the pt to remind her that her appt today @ 9:30 will be over the phone, unable to LVM since is full, she does NOT need to coming to the office

## 2020-06-08 NOTE — Telephone Encounter (Signed)
Notified. 

## 2020-06-09 ENCOUNTER — Encounter: Payer: 59 | Admitting: Physical Medicine & Rehabilitation

## 2020-06-09 LAB — SEDIMENTATION RATE: Sed Rate: 75 mm/hr — ABNORMAL HIGH (ref 0–40)

## 2020-06-09 LAB — C-REACTIVE PROTEIN: CRP: 47 mg/L — ABNORMAL HIGH (ref 0–10)

## 2020-06-09 LAB — ANA: Anti Nuclear Antibody (ANA): NEGATIVE

## 2020-06-09 LAB — RHEUMATOID FACTOR: Rheumatoid fact SerPl-aCnc: 10.1 IU/mL (ref 0.0–13.9)

## 2020-06-13 ENCOUNTER — Telehealth: Payer: Self-pay

## 2020-06-13 ENCOUNTER — Emergency Department (HOSPITAL_COMMUNITY): Payer: 59

## 2020-06-13 ENCOUNTER — Encounter (HOSPITAL_COMMUNITY): Payer: Self-pay | Admitting: Internal Medicine

## 2020-06-13 ENCOUNTER — Inpatient Hospital Stay (HOSPITAL_COMMUNITY)
Admission: EM | Admit: 2020-06-13 | Discharge: 2020-06-18 | DRG: 064 | Disposition: A | Discharge status: Skilled Nursing Facility | Payer: 59 | Attending: Internal Medicine | Admitting: Internal Medicine

## 2020-06-13 DIAGNOSIS — R700 Elevated erythrocyte sedimentation rate: Secondary | ICD-10-CM | POA: Diagnosis present

## 2020-06-13 DIAGNOSIS — Z7902 Long term (current) use of antithrombotics/antiplatelets: Secondary | ICD-10-CM

## 2020-06-13 DIAGNOSIS — R7982 Elevated C-reactive protein (CRP): Secondary | ICD-10-CM | POA: Diagnosis present

## 2020-06-13 DIAGNOSIS — Z7189 Other specified counseling: Secondary | ICD-10-CM | POA: Diagnosis not present

## 2020-06-13 DIAGNOSIS — Z20822 Contact with and (suspected) exposure to covid-19: Secondary | ICD-10-CM | POA: Diagnosis present

## 2020-06-13 DIAGNOSIS — G8929 Other chronic pain: Secondary | ICD-10-CM | POA: Diagnosis present

## 2020-06-13 DIAGNOSIS — Z6825 Body mass index (BMI) 25.0-25.9, adult: Secondary | ICD-10-CM | POA: Diagnosis not present

## 2020-06-13 DIAGNOSIS — I63421 Cerebral infarction due to embolism of right anterior cerebral artery: Secondary | ICD-10-CM | POA: Diagnosis present

## 2020-06-13 DIAGNOSIS — R1110 Vomiting, unspecified: Secondary | ICD-10-CM | POA: Diagnosis present

## 2020-06-13 DIAGNOSIS — Z7982 Long term (current) use of aspirin: Secondary | ICD-10-CM

## 2020-06-13 DIAGNOSIS — R911 Solitary pulmonary nodule: Secondary | ICD-10-CM | POA: Diagnosis present

## 2020-06-13 DIAGNOSIS — I614 Nontraumatic intracerebral hemorrhage in cerebellum: Secondary | ICD-10-CM | POA: Diagnosis present

## 2020-06-13 DIAGNOSIS — I6340 Cerebral infarction due to embolism of unspecified cerebral artery: Secondary | ICD-10-CM | POA: Diagnosis not present

## 2020-06-13 DIAGNOSIS — I69322 Dysarthria following cerebral infarction: Secondary | ICD-10-CM | POA: Diagnosis not present

## 2020-06-13 DIAGNOSIS — E785 Hyperlipidemia, unspecified: Secondary | ICD-10-CM | POA: Diagnosis present

## 2020-06-13 DIAGNOSIS — D649 Anemia, unspecified: Secondary | ICD-10-CM | POA: Diagnosis present

## 2020-06-13 DIAGNOSIS — I998 Other disorder of circulatory system: Secondary | ICD-10-CM | POA: Diagnosis present

## 2020-06-13 DIAGNOSIS — M170 Bilateral primary osteoarthritis of knee: Secondary | ICD-10-CM | POA: Diagnosis present

## 2020-06-13 DIAGNOSIS — M79605 Pain in left leg: Secondary | ICD-10-CM | POA: Diagnosis present

## 2020-06-13 DIAGNOSIS — D6859 Other primary thrombophilia: Secondary | ICD-10-CM | POA: Diagnosis present

## 2020-06-13 DIAGNOSIS — N1831 Chronic kidney disease, stage 3a: Secondary | ICD-10-CM | POA: Diagnosis present

## 2020-06-13 DIAGNOSIS — E872 Acidosis: Secondary | ICD-10-CM | POA: Diagnosis present

## 2020-06-13 DIAGNOSIS — Z791 Long term (current) use of non-steroidal anti-inflammatories (NSAID): Secondary | ICD-10-CM

## 2020-06-13 DIAGNOSIS — Z79890 Hormone replacement therapy (postmenopausal): Secondary | ICD-10-CM

## 2020-06-13 DIAGNOSIS — I639 Cerebral infarction, unspecified: Secondary | ICD-10-CM | POA: Diagnosis not present

## 2020-06-13 DIAGNOSIS — N281 Cyst of kidney, acquired: Secondary | ICD-10-CM | POA: Diagnosis present

## 2020-06-13 DIAGNOSIS — K5900 Constipation, unspecified: Secondary | ICD-10-CM | POA: Diagnosis present

## 2020-06-13 DIAGNOSIS — R29710 NIHSS score 10: Secondary | ICD-10-CM | POA: Diagnosis present

## 2020-06-13 DIAGNOSIS — Z79899 Other long term (current) drug therapy: Secondary | ICD-10-CM

## 2020-06-13 DIAGNOSIS — R4781 Slurred speech: Secondary | ICD-10-CM | POA: Diagnosis present

## 2020-06-13 DIAGNOSIS — R7303 Prediabetes: Secondary | ICD-10-CM | POA: Diagnosis present

## 2020-06-13 DIAGNOSIS — Z8673 Personal history of transient ischemic attack (TIA), and cerebral infarction without residual deficits: Secondary | ICD-10-CM | POA: Diagnosis not present

## 2020-06-13 DIAGNOSIS — E876 Hypokalemia: Secondary | ICD-10-CM | POA: Diagnosis present

## 2020-06-13 DIAGNOSIS — N183 Chronic kidney disease, stage 3 unspecified: Secondary | ICD-10-CM | POA: Diagnosis present

## 2020-06-13 DIAGNOSIS — Z993 Dependence on wheelchair: Secondary | ICD-10-CM

## 2020-06-13 DIAGNOSIS — Z539 Procedure and treatment not carried out, unspecified reason: Secondary | ICD-10-CM | POA: Diagnosis not present

## 2020-06-13 DIAGNOSIS — Z515 Encounter for palliative care: Secondary | ICD-10-CM

## 2020-06-13 DIAGNOSIS — E663 Overweight: Secondary | ICD-10-CM | POA: Diagnosis present

## 2020-06-13 DIAGNOSIS — M1990 Unspecified osteoarthritis, unspecified site: Secondary | ICD-10-CM | POA: Diagnosis present

## 2020-06-13 DIAGNOSIS — Z8249 Family history of ischemic heart disease and other diseases of the circulatory system: Secondary | ICD-10-CM

## 2020-06-13 DIAGNOSIS — R29810 Facial weakness: Secondary | ICD-10-CM | POA: Diagnosis present

## 2020-06-13 DIAGNOSIS — I69354 Hemiplegia and hemiparesis following cerebral infarction affecting left non-dominant side: Secondary | ICD-10-CM | POA: Diagnosis not present

## 2020-06-13 DIAGNOSIS — R54 Age-related physical debility: Secondary | ICD-10-CM | POA: Diagnosis present

## 2020-06-13 DIAGNOSIS — I340 Nonrheumatic mitral (valve) insufficiency: Secondary | ICD-10-CM | POA: Diagnosis not present

## 2020-06-13 DIAGNOSIS — I161 Hypertensive emergency: Secondary | ICD-10-CM | POA: Diagnosis present

## 2020-06-13 DIAGNOSIS — I129 Hypertensive chronic kidney disease with stage 1 through stage 4 chronic kidney disease, or unspecified chronic kidney disease: Secondary | ICD-10-CM | POA: Diagnosis present

## 2020-06-13 DIAGNOSIS — I6389 Other cerebral infarction: Secondary | ICD-10-CM | POA: Diagnosis not present

## 2020-06-13 DIAGNOSIS — R4182 Altered mental status, unspecified: Secondary | ICD-10-CM

## 2020-06-13 HISTORY — DX: Cerebral infarction, unspecified: I63.9

## 2020-06-13 HISTORY — DX: Chronic kidney disease, stage 3 unspecified: N18.30

## 2020-06-13 LAB — DIFFERENTIAL
Abs Immature Granulocytes: 0.03 10*3/uL (ref 0.00–0.07)
Basophils Absolute: 0.1 10*3/uL (ref 0.0–0.1)
Basophils Relative: 1 %
Eosinophils Absolute: 0.2 10*3/uL (ref 0.0–0.5)
Eosinophils Relative: 2 %
Immature Granulocytes: 0 %
Lymphocytes Relative: 20 %
Lymphs Abs: 1.7 10*3/uL (ref 0.7–4.0)
Monocytes Absolute: 1 10*3/uL (ref 0.1–1.0)
Monocytes Relative: 12 %
Neutro Abs: 5.7 10*3/uL (ref 1.7–7.7)
Neutrophils Relative %: 65 %

## 2020-06-13 LAB — CBG MONITORING, ED: Glucose-Capillary: 88 mg/dL (ref 70–99)

## 2020-06-13 LAB — I-STAT BETA HCG BLOOD, ED (MC, WL, AP ONLY): I-stat hCG, quantitative: <5 m[IU]/mL (ref <5)

## 2020-06-13 LAB — COMPREHENSIVE METABOLIC PANEL
ALT: 26 U/L (ref 0–44)
AST: 20 U/L (ref 15–41)
Albumin: 3.4 g/dL — ABNORMAL LOW (ref 3.5–5.0)
Alkaline Phosphatase: 83 U/L (ref 38–126)
Anion gap: 14 (ref 5–15)
BUN: 18 mg/dL (ref 6–20)
CO2: 20 mmol/L — ABNORMAL LOW (ref 22–32)
Calcium: 10.3 mg/dL (ref 8.9–10.3)
Chloride: 104 mmol/L (ref 98–111)
Creatinine, Ser: 1.34 mg/dL — ABNORMAL HIGH (ref 0.44–1.00)
GFR calc Af Amer: 50 mL/min — ABNORMAL LOW (ref >60)
GFR calc non Af Amer: 44 mL/min — ABNORMAL LOW (ref >60)
Glucose, Bld: 85 mg/dL (ref 70–99)
Potassium: 3.8 mmol/L (ref 3.5–5.1)
Sodium: 138 mmol/L (ref 135–145)
Total Bilirubin: 0.6 mg/dL (ref 0.3–1.2)
Total Protein: 7.8 g/dL (ref 6.5–8.1)

## 2020-06-13 LAB — I-STAT CHEM 8, ED
BUN: 19 mg/dL (ref 6–20)
Calcium, Ion: 1.22 mmol/L (ref 1.15–1.40)
Chloride: 105 mmol/L (ref 98–111)
Creatinine, Ser: 1.3 mg/dL — ABNORMAL HIGH (ref 0.44–1.00)
Glucose, Bld: 90 mg/dL (ref 70–99)
HCT: 38 % (ref 36.0–46.0)
Hemoglobin: 12.9 g/dL (ref 12.0–15.0)
Potassium: 3.5 mmol/L (ref 3.5–5.1)
Sodium: 139 mmol/L (ref 135–145)
TCO2: 23 mmol/L (ref 22–32)

## 2020-06-13 LAB — CBC
HCT: 38 % (ref 36.0–46.0)
Hemoglobin: 12.2 g/dL (ref 12.0–15.0)
MCH: 28.8 pg (ref 26.0–34.0)
MCHC: 32.1 g/dL (ref 30.0–36.0)
MCV: 89.8 fL (ref 80.0–100.0)
Platelets: 179 10*3/uL (ref 150–400)
RBC: 4.23 MIL/uL (ref 3.87–5.11)
RDW: 14.2 % (ref 11.5–15.5)
WBC: 8.7 10*3/uL (ref 4.0–10.5)
nRBC: 0 % (ref 0.0–0.2)

## 2020-06-13 LAB — APTT: aPTT: 41 seconds — ABNORMAL HIGH (ref 24–36)

## 2020-06-13 LAB — PROTIME-INR
INR: 1 (ref 0.8–1.2)
Prothrombin Time: 12.5 seconds (ref 11.4–15.2)

## 2020-06-13 MED ORDER — LABETALOL HCL 5 MG/ML IV SOLN
10.0000 mg | Freq: Once | INTRAVENOUS | Status: AC
  Administered 2020-06-14: 10 mg via INTRAVENOUS
  Filled 2020-06-13: qty 4

## 2020-06-13 MED ORDER — GADOBUTROL 1 MMOL/ML IV SOLN
6.5000 mL | Freq: Once | INTRAVENOUS | Status: AC | PRN
  Administered 2020-06-13: 6.5 mL via INTRAVENOUS

## 2020-06-13 MED ORDER — SODIUM CHLORIDE 0.9% FLUSH
3.0000 mL | Freq: Once | INTRAVENOUS | Status: AC
  Administered 2020-06-14: 3 mL via INTRAVENOUS

## 2020-06-13 NOTE — ED Provider Notes (Signed)
[facility] EMERGENCY DEPARTMENT Provider Note   CSN: [id number] Arrival date & time: 06/13/20  2117  An emergency department physician performed an initial assessment on this suspected stroke patient at 2115.  History No chief complaint on file.   [name] is a 59 y.o. female.  HPI  Patient presented as a code stroke.Presents from nursing home.  Reportedly had chronic left-sided weakness due to previous stroke.  Does not move very well.  However at 2:00 last normal.  Had worsening slurred speech and left facial droop.  Reportedly had gaze to the right also.  Also blood pressure in the 160F systolic.     Past Medical History:  Diagnosis Date  . Arthritis   . Hypertension   . Hypertension    not taking medications    Patient Active Problem List   Diagnosis Date Noted  . Labile blood pressure   . Slow transit constipation   . Acute blood loss anemia   . Left inguinal pain   . Pain   . Spastic hemiparesis ([place])   . Dysphagia, post-stroke   . Pressure injury of skin 05/04/2020  . Thrombocytosis   . Leukocytosis   . AKI (acute kidney injury) ([place])   . Benign essential HTN   . Lethargy   . CVA (cerebral vascular accident) ([place]) 05/03/2020  . 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 Hypertensive emergency   . Slurred speech   . Essential hypertension   . Acute combined systolic (congestive) and diastolic (congestive) heart failure ([place])   . Thrombocytopenia ([place])   . Stage 3b chronic kidney disease ([place])   . Prediabetes   . Acute CVA (cerebrovascular accident) ([place]) 04/27/2020  . Accelerated hypertension 04/27/2020  . Renal dysfunction 04/27/2020  . Hypokalemia 04/27/2020  . HTN (hypertension) 10/26/2015  . Anemia, iron deficiency 10/26/2015  . Visit for preventive health examination 10/26/2015  . Osteoarthritis of both knees     Past Surgical History:  Procedure Laterality  Date  . CESAREAN SECTION       OB History   No obstetric history on file.     Family History  Problem Relation Age of Onset  . Cancer Mother   . Hypertension Father   . Heart disease Father   . Heart disease Brother   . Stroke Neg Hx     Social History   Tobacco Use  . Smoking status: Never Smoker  . Smokeless tobacco: Never Used  Vaping Use  . Vaping Use: Never used  Substance Use Topics  . Alcohol use: Not Currently  . Drug use: Never    Home Medications Prior to Admission medications   Medication Sig Start Date End Date Taking? Authorizing Provider  acetaminophen (TYLENOL) 325 MG tablet Take 2 tablets (650 mg total) by mouth every 4 (four) hours as needed for mild pain (or temp > 37.5 C (99.5 F)). 05/31/20   Angiulli, Lavon Paganini, PA-C  amantadine (SYMMETREL) 50 MG/5ML solution Take 10 mLs (100 mg total) by mouth 2 (two) times daily with breakfast and lunch. 05/31/20   Angiulli, Lavon Paganini, PA-C  amLODipine (NORVASC) 10 MG tablet Take 1 tablet (10 mg total) by mouth daily. 05/31/20   Angiulli, Lavon Paganini, PA-C  aspirin 325 MG tablet Take 1 tablet (325 mg total) by mouth daily. 05/31/20   Angiulli, Lavon Paganini, PA-C  atorvastatin (LIPITOR) 40 MG tablet Take 1 tablet (40 mg total) by mouth daily. 05/31/20  Angiulli, Lavon Paganini, PA-C  baclofen (LIORESAL) 10 MG tablet Take 1 tablet (10 mg total) by mouth 2 (two) times daily. 05/31/20   Angiulli, Lavon Paganini, PA-C  clonazePAM (KLONOPIN) 0.5 MG tablet Take 0.5 tablets (0.25 mg total) by mouth 2 (two) times daily. 05/31/20 05/31/21  Jamse Arn, MD  cloNIDine (CATAPRES) 0.2 MG tablet Take 1 tablet (0.2 mg total) by mouth 2 (two) times daily. 05/31/20   Angiulli, Lavon Paganini, PA-C  diclofenac Sodium (VOLTAREN) 1 % GEL Apply 2 g topically 4 (four) times daily. 05/31/20   Angiulli, Lavon Paganini, PA-C  ferrous sulfate 325 (65 FE) MG tablet Take 1 tablet (325 mg total) by mouth 2 (two) times daily with a meal. 05/31/20   Angiulli, Lavon Paganini, PA-C  FLUoxetine  (PROZAC) 10 MG capsule Take 1 capsule (10 mg total) by mouth daily. 05/31/20   Angiulli, Lavon Paganini, PA-C  hydrALAZINE (APRESOLINE) 100 MG tablet Take 1 tablet (100 mg total) by mouth every 8 (eight) hours. 05/31/20   Angiulli, Lavon Paganini, PA-C  isosorbide mononitrate (IMDUR) 60 MG 24 hr tablet Take 1 tablet (60 mg total) by mouth daily. 06/02/20   Angiulli, Lavon Paganini, PA-C  melatonin 3 MG TABS tablet Take 1 tablet (3 mg total) by mouth at bedtime. 05/31/20   Angiulli, Lavon Paganini, PA-C  melatonin 3 MG TABS tablet TAKE 1 TABLET (3 MG TOTAL) BY MOUTH AT BEDTIME. 05/31/20   [provider]  Meloxicam POWD 15 mg by Does not apply route daily as needed. 06/08/20   Raulkar, Clide Deutscher, MD  metoprolol tartrate (LOPRESSOR) 25 MG tablet Take 1 tablet (25 mg total) by mouth 2 (two) times daily. 05/31/20   Angiulli, Lavon Paganini, PA-C  Multiple Vitamin (MULTIVITAMIN WITH MINERALS) TABS tablet Take 1 tablet by mouth daily. 05/04/20   Mariel Aloe, MD  omeprazole (PRILOSEC) 40 MG capsule Take 1 capsule (40 mg total) by mouth 2 (two) times daily for 60 doses. Open up 1 capsule & sprinkle on food twice daily 05/31/20 06/30/20  Jamse Arn, MD  polyethylene glycol (MIRALAX / GLYCOLAX) 17 g packet Take 17 g by mouth 2 (two) times daily. Patient not taking: Reported on 06/08/2020 05/31/20   Angiulli, Lavon Paganini, PA-C  sucralfate (CARAFATE) 1 g tablet Take 1 tablet (1 g total) by mouth 4 (four) times daily. Mix 1 tablet with 10-8mls of water to make a slurry and take by mouth four times daily with meals and at bedtime 05/31/20 05/31/21  Jamse Arn, MD  traMADol (ULTRAM) 50 MG tablet Take by mouth every 6 (six) hours as needed.    [provider]    Allergies    Patient has no known allergies.  Review of Systems   Review of Systems  Constitutional: Negative for appetite change.  HENT: Negative for congestion.   Respiratory: Negative for shortness of breath.   Cardiovascular: Negative for chest pain.   Gastrointestinal: Negative for abdominal pain.  Genitourinary: Negative for flank pain.  Musculoskeletal: Negative for back pain.  Skin: Negative for rash.  Neurological: Positive for speech difficulty and weakness.  Psychiatric/Behavioral: Negative for confusion.    Physical Exam Updated Vital Signs BP (!) 191/97 (BP Location: Right Arm)   Pulse 92   Resp 18   LMP 09/15/2013   SpO2 100%   Physical Exam Vitals and nursing note reviewed.  HENT:     Head: Normocephalic.  Cardiovascular:     Rate and Rhythm: Regular rhythm.  Pulmonary:  Breath sounds: No wheezing or rhonchi.  Abdominal:     Tenderness: There is no abdominal tenderness.  Musculoskeletal:        General: No tenderness.     Cervical back: Neck supple.  Skin:    General: Skin is warm.  Neurological:     Mental Status: She is alert.     Comments: Some left-sided facial droop.  Eyes will go to the left however.  Chronically weak on left compared to right.  Complete NIH scoring done by neurology.     ED Results / Procedures / Treatments   Labs (all labs ordered are listed, but only abnormal results are displayed) Labs Reviewed  APTT - Abnormal; Notable for the following components:      Result Value   aPTT 41 (*)    All other components within normal limits  COMPREHENSIVE METABOLIC PANEL - Abnormal; Notable for the following components:   CO2 20 (*)    Creatinine, Ser 1.34 (*)    Albumin 3.4 (*)    GFR calc non Af Amer 44 (*)    GFR calc Af Amer 50 (*)    All other components within normal limits  I-STAT CHEM 8, ED - Abnormal; Notable for the following components:   Creatinine, Ser 1.30 (*)    All other components within normal limits  RESPIRATORY PANEL BY RT PCR (FLU A&B, COVID)  PROTIME-INR  CBC  DIFFERENTIAL  URINALYSIS, ROUTINE W REFLEX MICROSCOPIC  CBG MONITORING, ED  I-STAT BETA HCG BLOOD, ED ([facility], [facility], [facility] ONLY)    EKG EKG Interpretation  Date/Time:  Monday June 13 2020 23:05:29  EDT Ventricular Rate:  81 PR Interval:    QRS Duration: 106 QT Interval:  387 QTC Calculation: 450 R Axis:   31 Text Interpretation: Sinus rhythm Probable left atrial enlargement LVH with secondary repolarization abnormality Anterior ST elevation, probably due to LVH Confirmed by Davonna Belling [phone]) on 06/13/2020 11:11:42 PM   Radiology CT HEAD CODE STROKE WO CONTRAST  Result Date: 06/13/2020 CLINICAL DATA:  Code stroke. Initial evaluation for acute left facial droop, slurred speech. Patient has a history of a recent right ACA territory infarct. EXAM: CT HEAD WITHOUT CONTRAST TECHNIQUE: Contiguous axial images were obtained from the base of the skull through the vertex without intravenous contrast. COMPARISON:  Prior CT from 09/19/2015. Additionally, there is a previous brain MRI from 04/27/2020, reviewed in epic, but not available for review in PACS. FINDINGS: Brain: Generalized age-related cerebral atrophy. Patchy and confluent hypodensity involving the periventricular deep white matter of both cerebral hemispheres most consistent with chronic small vessel ischemic disease. Patchy involvement of the deep gray nuclei, brainstem, and cerebellum noted as well. Appearance is fairly advanced in nature. Few scattered remote lacunar infarcts noted about the bilateral thalami. There is cortical and subcortical hypodensity involving the parasagittal right frontoparietal region, consistent with right ACA territory ischemia. Finding corresponds with previously identified right ACA territory infarct. However, appearance is somewhat more prominent on current exam as compared to previous brain MRI, raising the possibility for possible interval expansion and/or new acute component. Punctate hyperdensity at the posterior aspect of this area of ischemia suspicious for a small focus of associated petechial hemorrhage (series 3, image 23). No frank hemorrhagic transformation or mass effect. No other evidence for  acute large vessel territory infarct. Gray-white matter differentiation otherwise grossly maintained. No mass lesion or midline shift. No hydrocephalus or extra-axial fluid collection. Vascular: No hyperdense vessel. Scattered vascular calcifications noted within the carotid  siphons. Skull: Scalp soft tissues and calvarium within normal limits. Sinuses/Orbits: Globes and orbital soft tissues demonstrate no acute finding. Left maxillary sinus retention cyst noted. Paranasal sinuses are otherwise largely clear. No mastoid effusion. Other: Poor dentition noted. ASPECTS [facility] Stroke Program Early CT Score) - Ganglionic level infarction (caudate, lentiform nuclei, internal capsule, insula, M1-M3 cortex): 7 - Supraganglionic infarction (M4-M6 cortex): 3 Total score (0-10 with 10 being normal): 10 IMPRESSION: 1. Cortical and subcortical hypodensity involving the parasagittal right frontoparietal region, consistent with previously identified right ACA territory infarct. Appearance is somewhat more prominent on current exam as compared to previous brain MRI, raising the possibility for possible interval expansion and/or new acute component. Punctate hyperdensity at the posterior aspect of this area of ischemia suspicious for a small focus of associated petechial hemorrhage. No frank hemorrhagic transformation or mass effect. 2. ASPECTS is 10. 3. 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 These results were communicated to Dr. Rory Percy at 9:35 pmon 10/4/2021by text page via the [facility] messaging system. Findings also discussed by telephone with Dr. Rory Percy at time of dictation. Electronically Signed   By: Jeannine Boga M.D.   On: 06/13/2020 21:49    Procedures Procedures (including critical care time)  Medications Ordered in ED Medications  sodium chloride flush (NS) 0.9 % injection 3 mL (has no administration in time range)  labetalol (NORMODYNE) injection 10 mg  (has no administration in time range)  gadobutrol (GADAVIST) 1 MMOL/ML injection 6.5 mL (6.5 mLs Intravenous Contrast Given 06/13/20 2244)    ED Course  I have reviewed the triage vital signs and the nursing notes.  Pertinent labs & imaging results that were available during my care of the patient were reviewed by me and considered in my medical decision making (see chart for details).    MDM Rules/Calculators/A&P                          Patient presented as a code stroke.  Did have recent other stroke of ACA distribution.  Worsening left-sided deficits.  Head CT overall reassuring, however MRI done emergently and showed multiple new embolic strokes and likely hemorrhagic conversion of the stroke within the subacute time period.  Patient will need some mild blood pressure control.  Recommend goal blood pressure of less than 332 systolic.  Will give IV labetalol 10 mg.  Hospitalist admission.  Neurology has recommendations in their note.  CRITICAL CARE Performed by: Davonna Belling Total critical care time: 30 minutes Critical care time was exclusive of separately billable procedures and treating other patients. Critical care was necessary to treat or prevent imminent or life-threatening deterioration. Critical care was time spent personally by me on the following activities: development of treatment plan with patient and/or surrogate as well as nursing, discussions with consultants, evaluation of patient's response to treatment, examination of patient, obtaining history from patient or surrogate, ordering and performing treatments and interventions, ordering and review of laboratory studies, ordering and review of radiographic studies, pulse oximetry and re-evaluation of patient's condition.  Final Clinical Impression(s) / ED Diagnoses Final diagnoses:  Cerebrovascular accident (CVA), unspecified mechanism [facility])    Rx / [place] Orders ED Discharge Orders    None       Davonna Belling,  MD 06/13/20 2311

## 2020-06-13 NOTE — H&P (Addendum)
History and Physical    [name] XTK:[id number] DOB: June 05, 1961 DOA: 06/13/2020  PCP: Argentina Donovan, PA-C  Patient coming from: Home  I have personally briefly reviewed patient's old medical records in [place]  Chief Complaint: Left facial weakness and slurred speech worse from baseline  HPI: [name] is a 59 y.o. female with medical history significant for uncontrolled hypertension, CKD stage III, and recent stroke on 04/27/2020 involving right ACA territory (with residual left-sided hemiparesis and dysarthria) felt to be embolic due to unknown source who presents to the ED as a code stroke for evaluation of worsening left facial weakness and slurred speech compared to recent baseline.  Patient recently admitted 04/26/2020-05/03/2020 for acute right ACA branch infarct.  She was started on DAPT x3 weeks and transition to aspirin 81 mg alone.  She had negative work-up for source of stroke.  She had residual deficits of left-sided hemiparesis and dysarthria.  She was discharged to [facility] where she stayed until discharged from inpatient rehab 06/01/2020.  Patient was last known well around 2 PM today when family saw her.  On recheck afterwards they noticed that she had worsening left facial droop and slurred speech.  She was unable to move left side of her body which is unchanged since her recent stroke.  EMS were called and on their arrival she was noted to have SBP in the 200s.  Patient was reported to have recent severe constipation followed by a very large bowel movement.  She has had some episodes of emesis recently.  Currently patient has no complaints and denies chest pain, dyspnea, further nausea/vomiting, abdominal pain, dysuria.  She reports chronic pain in her left leg for which she has been taking tramadol and recently started on meloxicam.  ED Course:  Initial vitals showed BP 191/97, pulse 92, RR 18, SPO2 100% on room air.  Labs show WBC 8.7, hemoglobin  12.2, platelets 179,000, sodium 138, potassium 3.8, bicarb 20, BUN 18, creatinine 1.34, serum glucose 85, LFTs within normal limits.  I-STAT beta-hCG <5.0.  Respiratory PCR panel is ordered and pending.  CT head without contrast showed previously identified right ACA territory infarct changes with appearance raising possibility for possible interval expansion and/or new acute component.  MRI brain and MRA head without contrast showed an evolving subacute hematoma involving the left cerebellum which is new compared to prior MRI.  Interval expansion and enlargement of previously identified right ACA territory infarct seen, now moderate in size and acute to subacute in appearance.  Scattered acute to subacute ischemic nonhemorrhagic infarcts involving the subcortical posterior left frontal region, right parietal white matter, and splenium of the corpus callosum also seen.  Multiple chronic microhemorrhages, predominantly clustered about the thalami identified.  Neurology were consulted and recommended medical admission for careful blood pressure management.  Recommending obtaining TEE and avoiding any antiplatelets or anticoagulants for now.  The hospitalist service was consulted to admit for further evaluation and management.  Review of Systems: All systems reviewed and are negative except as documented in history of present illness above.   Past Medical History:  Diagnosis Date  . Arthritis   . CKD (chronic kidney disease) stage 3, GFR 30-59 ml/min (HCC)   . CVA (cerebral vascular accident) ([place])   . Hypertension   . Hypertension    not taking medications    Past Surgical History:  Procedure Laterality Date  . CESAREAN SECTION      Social History:  reports that she has never  smoked. She has never used smokeless tobacco. She reports previous alcohol use. She reports that she does not use drugs.  No Known Allergies  Family History  Problem Relation Age of Onset  . Cancer Mother   .  Hypertension Father   . Heart disease Father   . Heart disease Brother   . Stroke Neg Hx      Prior to Admission medications   Medication Sig Start Date End Date Taking? Authorizing Provider  acetaminophen (TYLENOL) 325 MG tablet Take 2 tablets (650 mg total) by mouth every 4 (four) hours as needed for mild pain (or temp > 37.5 C (99.5 F)). 05/31/20   Angiulli, Lavon Paganini, PA-C  amantadine (SYMMETREL) 50 MG/5ML solution Take 10 mLs (100 mg total) by mouth 2 (two) times daily with breakfast and lunch. 05/31/20   Angiulli, Lavon Paganini, PA-C  amLODipine (NORVASC) 10 MG tablet Take 1 tablet (10 mg total) by mouth daily. 05/31/20   Angiulli, Lavon Paganini, PA-C  aspirin 325 MG tablet Take 1 tablet (325 mg total) by mouth daily. 05/31/20   Angiulli, Lavon Paganini, PA-C  atorvastatin (LIPITOR) 40 MG tablet Take 1 tablet (40 mg total) by mouth daily. 05/31/20   Angiulli, Lavon Paganini, PA-C  baclofen (LIORESAL) 10 MG tablet Take 1 tablet (10 mg total) by mouth 2 (two) times daily. 05/31/20   Angiulli, Lavon Paganini, PA-C  clonazePAM (KLONOPIN) 0.5 MG tablet Take 0.5 tablets (0.25 mg total) by mouth 2 (two) times daily. 05/31/20 05/31/21  Jamse Arn, MD  cloNIDine (CATAPRES) 0.2 MG tablet Take 1 tablet (0.2 mg total) by mouth 2 (two) times daily. 05/31/20   Angiulli, Lavon Paganini, PA-C  diclofenac Sodium (VOLTAREN) 1 % GEL Apply 2 g topically 4 (four) times daily. 05/31/20   Angiulli, Lavon Paganini, PA-C  ferrous sulfate 325 (65 FE) MG tablet Take 1 tablet (325 mg total) by mouth 2 (two) times daily with a meal. 05/31/20   Angiulli, Lavon Paganini, PA-C  FLUoxetine (PROZAC) 10 MG capsule Take 1 capsule (10 mg total) by mouth daily. 05/31/20   Angiulli, Lavon Paganini, PA-C  hydrALAZINE (APRESOLINE) 100 MG tablet Take 1 tablet (100 mg total) by mouth every 8 (eight) hours. 05/31/20   Angiulli, Lavon Paganini, PA-C  isosorbide mononitrate (IMDUR) 60 MG 24 hr tablet Take 1 tablet (60 mg total) by mouth daily. 06/02/20   Angiulli, Lavon Paganini, PA-C  melatonin 3 MG  TABS tablet Take 1 tablet (3 mg total) by mouth at bedtime. 05/31/20   Angiulli, Lavon Paganini, PA-C  melatonin 3 MG TABS tablet TAKE 1 TABLET (3 MG TOTAL) BY MOUTH AT BEDTIME. 05/31/20   [provider]  Meloxicam POWD 15 mg by Does not apply route daily as needed. 06/08/20   Raulkar, Clide Deutscher, MD  metoprolol tartrate (LOPRESSOR) 25 MG tablet Take 1 tablet (25 mg total) by mouth 2 (two) times daily. 05/31/20   Angiulli, Lavon Paganini, PA-C  Multiple Vitamin (MULTIVITAMIN WITH MINERALS) TABS tablet Take 1 tablet by mouth daily. 05/04/20   Mariel Aloe, MD  omeprazole (PRILOSEC) 40 MG capsule Take 1 capsule (40 mg total) by mouth 2 (two) times daily for 60 doses. Open up 1 capsule & sprinkle on food twice daily 05/31/20 06/30/20  Jamse Arn, MD  polyethylene glycol (MIRALAX / GLYCOLAX) 17 g packet Take 17 g by mouth 2 (two) times daily. Patient not taking: Reported on 06/08/2020 05/31/20   Cathlyn Parsons, PA-C  sucralfate (CARAFATE) 1 g tablet Take  1 tablet (1 g total) by mouth 4 (four) times daily. Mix 1 tablet with 10-70mls of water to make a slurry and take by mouth four times daily with meals and at bedtime 05/31/20 05/31/21  Jamse Arn, MD  traMADol (ULTRAM) 50 MG tablet Take by mouth every 6 (six) hours as needed.    [provider]    Physical Exam: Vitals:   06/13/20 2122 06/13/20 2330  BP: (!) 191/97 (!) 202/108  Pulse: 92 78  Resp: 18 14  SpO2: 100% 99%   Constitutional: Ill-appearing woman resting supine in bed, NAD, calm, comfortable Eyes: PERRL, EOMI, lids and conjunctivae normal ENMT: Mucous membranes are dry. Posterior pharynx clear of any exudate or lesions.poor dentition.  Neck: normal, supple, no masses. Respiratory: clear to auscultation anteriorly normal respiratory effort. No accessory muscle use.  Cardiovascular: Regular rate and rhythm, 2/6 systolic murmur present. No extremity edema. 2+ pedal pulses. Abdomen: no tenderness, no masses palpated.  Bowel sounds positive.  Musculoskeletal: no clubbing / cyanosis. No joint deformity upper and lower extremities.  No movement at left upper or lower extremity.  Moving right upper and right lower extremities equally. Skin: no rashes, lesions, ulcers. No induration Neurologic: Moderate dysarthria with slight left facial weakness otherwise CN 2-12 grossly intact. Sensation intact, Strength 0/5 left upper and lower extremities, 4-5/5 right upper and lower extremities. Psychiatric: Alert and oriented x 3.  Flat affect.  Labs on Admission: I have personally reviewed following labs and imaging studies  CBC: Recent Labs  Lab 06/13/20 2115 06/13/20 2126  WBC 8.7  --   NEUTROABS 5.7  --   HGB 12.2 12.9  HCT 38.0 38.0  MCV 89.8  --   PLT 179  --    Basic Metabolic Panel: Recent Labs  Lab 06/13/20 2115 06/13/20 2126  NA 138 139  K 3.8 3.5  CL 104 105  CO2 20*  --   GLUCOSE 85 90  BUN 18 19  CREATININE 1.34* 1.30*  CALCIUM 10.3  --    GFR: Estimated Creatinine Clearance: 43 mL/min (A) (by C-G formula based on SCr of 1.3 mg/dL (H)). Liver Function Tests: Recent Labs  Lab 06/13/20 2115  AST 20  ALT 26  ALKPHOS 83  BILITOT 0.6  PROT 7.8  ALBUMIN 3.4*   No results for input(s): LIPASE, AMYLASE in the last 168 hours. No results for input(s): AMMONIA in the last 168 hours. Coagulation Profile: Recent Labs  Lab 06/13/20 2115  INR 1.0   Cardiac Enzymes: No results for input(s): CKTOTAL, CKMB, CKMBINDEX, TROPONINI in the last 168 hours. BNP (last 3 results) No results for input(s): PROBNP in the last 8760 hours. HbA1C: No results for input(s): HGBA1C in the last 72 hours. CBG: Recent Labs  Lab 06/13/20 2115  GLUCAP 88   Lipid Profile: No results for input(s): CHOL, HDL, LDLCALC, TRIG, CHOLHDL, LDLDIRECT in the last 72 hours. Thyroid Function Tests: No results for input(s): TSH, T4TOTAL, FREET4, T3FREE, THYROIDAB in the last 72 hours. Anemia Panel: No results for  input(s): VITAMINB12, FOLATE, FERRITIN, TIBC, IRON, RETICCTPCT in the last 72 hours. Urine analysis:    Component Value Date/Time   COLORURINE YELLOW 05/23/2020 1800   APPEARANCEUR HAZY (A) 05/23/2020 1800   LABSPEC 1.011 05/23/2020 1800   PHURINE 5.0 05/23/2020 1800   GLUCOSEU NEGATIVE 05/23/2020 1800   HGBUR NEGATIVE 05/23/2020 1800   BILIRUBINUR NEGATIVE 05/23/2020 1800   KETONESUR NEGATIVE 05/23/2020 1800   PROTEINUR NEGATIVE 05/23/2020 1800   UROBILINOGEN  0.2 10/09/2010 1518   NITRITE NEGATIVE 05/23/2020 1800   LEUKOCYTESUR NEGATIVE 05/23/2020 1800    Radiological Exams on Admission: MR ANGIO HEAD WO CONTRAST  Result Date: 06/13/2020 CLINICAL DATA:  Initial evaluation for neuro deficit, stroke suspected. History of recent right ACA territory infarct. EXAM: MRI HEAD WITHOUT AND WITH CONTRAST MRA HEAD WITHOUT CONTRAST TECHNIQUE: Multiplanar, multiecho pulse sequences of the brain and surrounding structures were obtained without and with intravenous contrast. Angiographic images of the head were obtained using MRA technique without contrast. CONTRAST:  6.77mL GADAVIST GADOBUTROL 1 MMOL/ML IV SOLN COMPARISON:  Comparison made with CT from earlier same day as well as previous CTA and MRI from 04/27/2020. FINDINGS: MRI HEAD FINDINGS Brain: Diffuse prominence of the CSF containing spaces compatible with generalized age-related cerebral atrophy. Patchy and confluent T2/FLAIR hyperintensity within the periventricular and deep white matter both cerebral hemispheres most consistent with chronic small vessel ischemic disease, advanced in nature. Patchy involvement of the deep gray nuclei, brainstem, and cerebellum noted as well. Few scattered remote lacunar infarcts present about the hemispheric cerebral white matter and thalami. Geographic cortical and subcortical restricted diffusion seen involving the parasagittal right frontal parietal region, increased in size in expanded as compared to previous  brain MRI, compatible with acute to subacute right ACA territory infarct. Associated enhancement seen following contrast administration. No associated hemorrhage or significant mass effect. Few additional scattered foci of restricted diffusion involving the subcortical posterior left frontal region, right periatrial white matter, and splenium of the corpus callosum also consistent with acute to subacute ischemic infarcts. No associated hemorrhage. These findings are new as compared to prior exam. 1.4 x 2.3 x 1.7 cm well-circumscribed lobulated T1/FLAIR hyperintense lesion seen involving the left cerebellum (series 4, image 7). Lesion demonstrates fairly avid root restricted diffusion. No significant associated enhancement or regional mass effect. Finding favored to reflect an evolving subacute hematoma, new as compared to prior MRI. Finding is difficult to see on prior CT performed earlier the same day. No other findings to suggest acute or subacute intracranial hemorrhage. Multiple additional chronic microhemorrhages noted, predominantly clustered about the thalami, favored to be related to chronic underlying poorly controlled hypertension given distribution. This additional finding suggests that the left cerebellar hematoma may be hypertensive in nature as well. No mass lesion, mass effect, or midline shift. No hydrocephalus or extra-axial fluid collection. No other abnormal enhancement. Pituitary gland and suprasellar region within normal limits. Midline structures intact. Vascular: Major intracranial vascular flow voids are maintained. Skull and upper cervical spine: Craniocervical junction within normal limits. Bone marrow signal intensity normal. No scalp soft tissue abnormality. Sinuses/Orbits: Globes and orbital soft tissues within normal limits. Left maxillary sinus retention cyst. Paranasal sinuses are otherwise clear. No mastoid effusion. Other: None. MRA HEAD FINDINGS ANTERIOR CIRCULATION: Examination  moderately degraded by motion artifact. Visualized distal cervical segments of the internal carotid arteries are patent with symmetric antegrade flow. Petrous segments patent bilaterally. Scattered atheromatous change noted throughout the carotid siphons, grossly similar to previous CTA. A1 segments patent bilaterally. Normal anterior communicating artery complex. Left ACA widely patent. Right A2 segment hypoplastic and diminutive, but remains grossly patent. M1 segments patent bilaterally. Normal MCA bifurcations. Distal MCA branches well perfused and symmetric. POSTERIOR CIRCULATION: Vertebral arteries patent to the vertebrobasilar junction without stenosis. Left PICA patent at its origin. Right PICA not seen. Basilar grossly patent to its distal aspect without appreciable stenosis. Superior cerebral arteries patent bilaterally. Both PCAs primarily supplied via the basilar. Scattered atheromatous change about the  PCAs bilaterally without occlusion, grossly similar to previous. No appreciable aneurysm. IMPRESSION: MRI HEAD IMPRESSION: 1. 1.4 x 2.3 x 1.7 cm well-circumscribed lobulated T1/FLAIR hyperintense lesion involving the left cerebellum, favored to reflect an evolving subacute hematoma, new as compared to prior MRI. No significant regional mass effect. Finding suspected to be hypertensive in nature. 2. Interval expansion and enlargement of previously identified right ACA territory infarct, now moderate in size and acute to subacute in appearance. No significant mass effect. 3. Few additional scattered acute to subacute ischemic nonhemorrhagic infarcts involving the subcortical posterior left frontal region, right periatrial white matter, and splenium of the corpus callosum. 4. Underlying age-related cerebral atrophy with advanced chronic microvascular ischemic disease. 5. Multiple chronic microhemorrhages, predominantly clustered about the thalami, favored to be related to chronic poorly controlled  hypertension given distribution. 6. Underlying atrophy with advanced chronic small vessel ischemic disease. MRA HEAD IMPRESSION: 1. Motion degraded exam. 2. Grossly stable appearance of the intracranial circulation. No large vessel occlusion. Scattered atheromatous change about the carotid siphons, right ACA, and PCAs. No definite new or progressive finding. Electronically Signed   By: Jeannine Boga M.D.   On: 06/13/2020 23:29   MR BRAIN W WO CONTRAST  Result Date: 06/13/2020 CLINICAL DATA:  Initial evaluation for neuro deficit, stroke suspected. History of recent right ACA territory infarct. EXAM: MRI HEAD WITHOUT AND WITH CONTRAST MRA HEAD WITHOUT CONTRAST TECHNIQUE: Multiplanar, multiecho pulse sequences of the brain and surrounding structures were obtained without and with intravenous contrast. Angiographic images of the head were obtained using MRA technique without contrast. CONTRAST:  6.6mL GADAVIST GADOBUTROL 1 MMOL/ML IV SOLN COMPARISON:  Comparison made with CT from earlier same day as well as previous CTA and MRI from 04/27/2020. FINDINGS: MRI HEAD FINDINGS Brain: Diffuse prominence of the CSF containing spaces compatible with generalized age-related cerebral atrophy. Patchy and confluent T2/FLAIR hyperintensity within the periventricular and deep white matter both cerebral hemispheres most consistent with chronic small vessel ischemic disease, advanced in nature. Patchy involvement of the deep gray nuclei, brainstem, and cerebellum noted as well. Few scattered remote lacunar infarcts present about the hemispheric cerebral white matter and thalami. 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 Associated enhancement seen following contrast administration. No associated hemorrhage or significant  mass effect. Few additional scattered foci of restricted diffusion involving the subcortical posterior left frontal region, right periatrial white matter, and splenium of the corpus callosum also consistent with acute to subacute ischemic infarcts. No associated hemorrhage. These findings are new as compared to prior exam. 1.4 x 2.3 x 1.7 cm well-circumscribed lobulated T1/FLAIR hyperintense lesion seen involving the left cerebellum (series 4, image 7). Lesion demonstrates fairly avid root restricted diffusion. No significant associated enhancement or regional mass effect. Finding favored to reflect an evolving subacute hematoma, new as compared to prior MRI. Finding is difficult to see on prior CT performed earlier the same day. No other findings to suggest acute or subacute intracranial hemorrhage. Multiple additional chronic microhemorrhages noted, predominantly clustered about the thalami, favored to be related to chronic underlying poorly controlled hypertension given distribution. This additional finding suggests that the left cerebellar hematoma may be hypertensive in nature as well. No mass lesion, mass effect, or midline shift. No hydrocephalus or extra-axial fluid collection. No other abnormal enhancement. Pituitary gland and suprasellar region within normal limits. Midline structures intact.  Vascular: Major intracranial vascular flow voids are maintained. Skull and upper cervical spine: Craniocervical junction within normal limits. Bone marrow signal intensity normal. No scalp soft tissue abnormality. Sinuses/Orbits: Globes and orbital soft tissues within normal limits. Left maxillary sinus retention cyst. Paranasal sinuses are otherwise clear. No mastoid effusion. Other: None. MRA HEAD FINDINGS ANTERIOR CIRCULATION: Examination moderately degraded by motion artifact. Visualized distal cervical segments of the internal carotid arteries are patent with symmetric antegrade flow. Petrous segments patent  bilaterally. Scattered atheromatous change noted throughout the carotid siphons, grossly similar to previous CTA. A1 segments patent bilaterally. Normal anterior communicating artery complex. Left ACA widely patent. Right A2 segment hypoplastic and diminutive, but remains grossly patent. M1 segments patent bilaterally. Normal MCA bifurcations. Distal MCA branches well perfused and symmetric. POSTERIOR CIRCULATION: Vertebral arteries patent to the vertebrobasilar junction without stenosis. Left PICA patent at its origin. Right PICA not seen. Basilar grossly patent to its distal aspect without appreciable stenosis. Superior cerebral arteries patent bilaterally. Both PCAs primarily supplied via the basilar. Scattered atheromatous change about the PCAs bilaterally without occlusion, grossly similar to previous. No appreciable aneurysm. IMPRESSION: MRI HEAD IMPRESSION: 1. 1.4 x 2.3 x 1.7 cm well-circumscribed lobulated T1/FLAIR hyperintense lesion involving the left cerebellum, favored to reflect an evolving subacute hematoma, new as compared to prior MRI. No significant regional mass effect. Finding suspected to be hypertensive in nature. 2. Interval expansion and enlargement of previously identified right ACA territory infarct, now moderate in size and acute to subacute in appearance. No significant mass effect. 3. Few additional scattered acute to subacute ischemic nonhemorrhagic infarcts involving the subcortical posterior left frontal region, right periatrial white matter, and splenium of the corpus callosum. 4. Underlying age-related cerebral atrophy with advanced chronic microvascular ischemic disease. 5. Multiple chronic microhemorrhages, predominantly clustered about the thalami, favored to be related to chronic poorly controlled hypertension given distribution. 6. Underlying atrophy with advanced chronic small vessel ischemic disease. MRA HEAD IMPRESSION: 1. Motion degraded exam. 2. Grossly stable appearance of  the intracranial circulation. No large vessel occlusion. Scattered atheromatous change about the carotid siphons, right ACA, and PCAs. No definite new or progressive finding. Electronically Signed   By: Jeannine Boga M.D.   On: 06/13/2020 23:29   CT HEAD CODE STROKE WO CONTRAST  Result Date: 06/13/2020 CLINICAL DATA:  Code stroke. Initial evaluation for acute left facial droop, slurred speech. Patient has a history of a recent right ACA territory infarct. EXAM: CT HEAD WITHOUT CONTRAST TECHNIQUE: Contiguous axial images were obtained from the base of the skull through the vertex without intravenous contrast. COMPARISON:  Prior CT from 09/19/2015. Additionally, there is a previous brain MRI from 04/27/2020, reviewed in epic, but not available for review in PACS. FINDINGS: Brain: Generalized age-related cerebral atrophy. Patchy and confluent hypodensity involving the periventricular deep white matter of both cerebral hemispheres most consistent with chronic small vessel ischemic disease. Patchy involvement of the deep gray nuclei, brainstem, and cerebellum noted as well. Appearance is fairly advanced in nature. Few scattered remote lacunar infarcts noted about the bilateral thalami. There is cortical and subcortical hypodensity involving the parasagittal right frontoparietal region, consistent with right ACA territory ischemia. Finding corresponds with previously identified right ACA territory infarct. However, appearance is somewhat more prominent on current exam as compared to previous brain MRI, raising the possibility for possible interval expansion and/or new acute component. Punctate hyperdensity at the posterior aspect of this area of ischemia suspicious for a small focus of associated petechial hemorrhage (series 3, image  23). No frank hemorrhagic transformation or mass effect. No other evidence for acute large vessel territory infarct. Gray-white matter differentiation otherwise grossly maintained.  No mass lesion or midline shift. No hydrocephalus or extra-axial fluid collection. Vascular: No hyperdense vessel. Scattered vascular calcifications noted within the carotid siphons. Skull: Scalp soft tissues and calvarium within normal limits. Sinuses/Orbits: Globes and orbital soft tissues demonstrate no acute finding. Left maxillary sinus retention cyst noted. Paranasal sinuses are otherwise largely clear. No mastoid effusion. Other: Poor dentition noted. ASPECTS [facility] Stroke Program Early CT Score) - Ganglionic level infarction (caudate, lentiform nuclei, internal capsule, insula, M1-M3 cortex): 7 - Supraganglionic infarction (M4-M6 cortex): 3 Total score (0-10 with 10 being normal): 10 IMPRESSION: 1. Cortical and subcortical hypodensity involving the parasagittal right frontoparietal region, consistent with previously identified right ACA territory infarct. Appearance is somewhat more prominent on current exam as compared to previous brain MRI, raising the possibility for possible interval expansion and/or new acute component. Punctate hyperdensity at the posterior aspect of this area of ischemia suspicious for a small focus of associated petechial hemorrhage. No frank hemorrhagic transformation or mass effect. 2. ASPECTS is 10. 3. 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 These results were communicated to Dr. Rory Percy at 9:35 pmon 10/4/2021by text page via the [facility] messaging system. Findings also discussed by telephone with Dr. Rory Percy at time of dictation. Electronically Signed   By: Jeannine Boga M.D.   On: 06/13/2020 21:49    EKG: Independently reviewed. Sinus rhythm with nonspecific T wave changes inferior lateral leads.  Not significantly changed when compared to prior.  Assessment/Plan Principal Problem:   Acute CVA (cerebrovascular accident) [facility]) Active Problems:   Hypertensive emergency   CKD (chronic kidney disease) stage 3, GFR  30-59 ml/min (HCC)  PRIYA MATSEN is a 59 y.o. 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  Neurology following, appreciate recommendations as below. -Admit to progressive care unit -Hold off on anticoagulation and antiplatelets; avoid NSAIDs -Blood pressure control with goal SBP <180 first 24 hours then <160 following 24 hours with gradual reduction to 140/90 -Will need cardiology consult in a.m. for TEE -Obtain lower extremity venous Dopplers -PT/OT/SLP eval -Keep n.p.o. pending stroke swallow screen -Place on gentle IV fluid hydration overnight with NS @100  mL/hr x 12hrs  Hypertensive emergency: As above, blood pressure control with goal SBP <180 first 24 hours then <160 following 24 hours with gradual reduction to 140/90. -Hold home antihypertensive for now and place on IV labetalol prn for goal SBP 160-180 first 24 hours  Recent nausea/vomiting and constipation: Suspect nausea/vomiting from intracranial hematoma.  Will check CXR and KUB.  Follow urinalysis.  CKD stage III: Stable with improved renal function compared to previous.  Continue to monitor.  DVT prophylaxis: SCDs Code Status: DNR on last admission.  Patient requests full code this admission. Family Communication: Discussed with patient's daughter by phone, Laverta Baltimore [phone]. Disposition Plan: From home, likely needs SNF when stable for  discharge. Consults called:  Neurology Admission status:  Status is: Inpatient  Remains inpatient appropriate because:Unsafe d/c plan and Inpatient level of care appropriate due to severity of illness  Dispo: The patient is from: Home              Anticipated d/c is to: SNF              Anticipated d/c date is: 3 days              Patient currently is not medically stable to d/c.  Zada Finders MD [facility]  If 7PM-7AM, please contact night-coverage [email or web address]  06/14/2020, 12:02 AM

## 2020-06-13 NOTE — Consult Note (Addendum)
Neurology Consultation  Reason for Consult: code stroke for slurred speech, left facial droop worse than what it was after the recent August 2021 stroke  Referring Physician: Dr Davonna Belling, EDP  CC: Left facial weakness worse than baseline from August, slurred speech worse than before  History is obtained from: EMS, chart review, patient  HPI: [name] is a 59 y.o. female past medical history of hypertension noncompliant on medications, recent stroke on April 27, 2020 involving the right ACA territory-embolic secondary to unknown source, discharged home on dual antiplatelets for 3 weeks followed by monotherapy with aspirin, presented to the emergency room via EMS who were called to evaluate the patient for worsening left facial droop and slurred speech. Last known well 2 PM when family saw her and upon repeat assessment, she was noted to have worsened slurred speech and left facial droop which was worse than prior notice.  She has not been able to move the left side of her body well since the stroke, which was unchanged.  EMS evaluated the patient on scene and activated a code stroke. Her systolic blood pressure in the 200s.    Later on, daughter arrived.  She reports that the patient has been feeling sick over the weekend.  She had severe constipation followed by a very large bowel movement and feeling unwell.  No urinary complaints.  No fevers or chills but she also had episode of vomitus twice over the past weekend. No chest pain or shortness of breath. Daughter reports that the patient is being given medications at home but on multiple occasions she spits them out.  Also reports worsening spasms in the left leg, including more pain for which she was given meloxicam and tramadol.  Since her last stroke, she has been nonambulatory and has been transported as needed to doctors appointments on wheelchair only.   LKW: 2 PM on 06/13/2020 tpa given?: no, outside the  window Premorbid modified Rankin scale (mRS):4 ICH Score (not sure if it applies in this case): 1 for infratentorial origin of the subacute bleed   ROS: Performed and negative except as noted in HPI.  Past Medical History:  Diagnosis Date  . Arthritis   . Hypertension   . Hypertension    not taking medications    Family History  Problem Relation Age of Onset  . Cancer Mother   . Hypertension Father   . Heart disease Father   . Heart disease Brother   . Stroke Neg Hx     Social History:   reports that she has never smoked. She has never used smokeless tobacco. She reports previous alcohol use. She reports that she does not use drugs.  Medications  Current Facility-Administered Medications:  .  sodium chloride flush (NS) 0.9 % injection 3 mL, 3 mL, Intravenous, Once, Davonna Belling, MD  Current Outpatient Medications:  .  acetaminophen (TYLENOL) 325 MG tablet, Take 2 tablets (650 mg total) by mouth every 4 (four) hours as needed for mild pain (or temp > 37.5 C (99.5 F))., Disp: , Rfl:  .  amantadine (SYMMETREL) 50 MG/5ML solution, Take 10 mLs (100 mg total) by mouth 2 (two) times daily with breakfast and lunch., Disp: 140 mL, Rfl: 0 .  amLODipine (NORVASC) 10 MG tablet, Take 1 tablet (10 mg total) by mouth daily., Disp: 30 tablet, Rfl: 0 .  aspirin 325 MG tablet, Take 1 tablet (325 mg total) by mouth daily., Disp: , Rfl:  .  atorvastatin (LIPITOR) 40 MG  tablet, Take 1 tablet (40 mg total) by mouth daily., Disp: 30 tablet, Rfl: 0 .  baclofen (LIORESAL) 10 MG tablet, Take 1 tablet (10 mg total) by mouth 2 (two) times daily., Disp: 60 each, Rfl: 0 .  clonazePAM (KLONOPIN) 0.5 MG tablet, Take 0.5 tablets (0.25 mg total) by mouth 2 (two) times daily., Disp: 10 tablet, Rfl: 0 .  cloNIDine (CATAPRES) 0.2 MG tablet, Take 1 tablet (0.2 mg total) by mouth 2 (two) times daily., Disp: 60 tablet, Rfl: 11 .  diclofenac Sodium (VOLTAREN) 1 % GEL, Apply 2 g topically 4 (four) times daily.,  Disp: 2 g, Rfl: 0 .  ferrous sulfate 325 (65 FE) MG tablet, Take 1 tablet (325 mg total) by mouth 2 (two) times daily with a meal., Disp: 60 tablet, Rfl: 3 .  FLUoxetine (PROZAC) 10 MG capsule, Take 1 capsule (10 mg total) by mouth daily., Disp: 30 capsule, Rfl: 3 .  hydrALAZINE (APRESOLINE) 100 MG tablet, Take 1 tablet (100 mg total) by mouth every 8 (eight) hours., Disp: 90 tablet, Rfl: 0 .  isosorbide mononitrate (IMDUR) 60 MG 24 hr tablet, Take 1 tablet (60 mg total) by mouth daily., Disp: 30 tablet, Rfl: 0 .  melatonin 3 MG TABS tablet, Take 1 tablet (3 mg total) by mouth at bedtime., Disp: 30 tablet, Rfl: 0 .  melatonin 3 MG TABS tablet, TAKE 1 TABLET (3 MG TOTAL) BY MOUTH AT BEDTIME., Disp: , Rfl:  .  Meloxicam POWD, 15 mg by Does not apply route daily as needed., Disp: 100 g, Rfl: 3 .  metoprolol tartrate (LOPRESSOR) 25 MG tablet, Take 1 tablet (25 mg total) by mouth 2 (two) times daily., Disp: 60 tablet, Rfl: 0 .  Multiple Vitamin (MULTIVITAMIN WITH MINERALS) TABS tablet, Take 1 tablet by mouth daily., Disp: , Rfl:  .  omeprazole (PRILOSEC) 40 MG capsule, Take 1 capsule (40 mg total) by mouth 2 (two) times daily for 60 doses. Open up 1 capsule & sprinkle on food twice daily, Disp: 60 capsule, Rfl: 0 .  polyethylene glycol (MIRALAX / GLYCOLAX) 17 g packet, Take 17 g by mouth 2 (two) times daily. (Patient not taking: Reported on 06/08/2020), Disp: 14 each, Rfl: 0 .  sucralfate (CARAFATE) 1 g tablet, Take 1 tablet (1 g total) by mouth 4 (four) times daily. Mix 1 tablet with 10-67mls of water to make a slurry and take by mouth four times daily with meals and at bedtime, Disp: 120 tablet, Rfl: 1 .  traMADol (ULTRAM) 50 MG tablet, Take by mouth every 6 (six) hours as needed., Disp: , Rfl:    Exam: Current vital signs: BP (!) 191/97 (BP Location: Right Arm)   Pulse 92   Resp 18   LMP 09/15/2013   SpO2 100%  Vital signs in last 24 hours: Pulse Rate:  [92] 92 (10/04 2122) Resp:  [18] 18  (10/04 2122) BP: (191)/(97) 191/97 (10/04 2122) SpO2:  [100 %] 100 % (10/04 2122)  General: Awake alert no distress HEENT: Normocephalic, atraumatic, dry oral mucous membranes, no thyromegaly. Lungs: Clear to auscultation without wheezing CVS: S1-S2 heard, regular rate rhythm, no murmur rub or gallop. Abdomen: Mildly distended and tender. Extremities: Warm well perfused Neurological exam Awake alert oriented to self, place and time. Speech is moderately dysarthric No evidence of aphasia Able to follow all commands, repetition intact, fluency intact. Cranial nerve examination: Pupils equal round react light, extraocular movements appear intact-although initially it appeared she might have a right gaze preference  but she is able to look both sides without any restriction of motion, no visual field deficits noted on confrontation, left lower face is weak, tongue and palate midline. Motor exam: No movement appreciated in the left lower extremity-has some increased tone.  Left upper extremity with trace movement 1/5. Sensory: Intact to touch on both sides. Coordination: No gross dysmetria on the right.  Unable to perform on the left. Gait testing deferred. NIH stroke scale-10  Labs I have reviewed labs in epic and the results pertinent to this consultation are:  CBC    Component Value Date/Time   WBC 5.6 05/30/2020 0633   RBC 3.27 (L) 05/30/2020 0633   HGB 12.9 06/13/2020 2126   HGB 11.2 10/25/2015 1048   HCT 38.0 06/13/2020 2126   HCT 34.8 10/25/2015 1048   PLT PLATELET CLUMPS NOTED ON SMEAR, UNABLE TO ESTIMATE 05/30/2020 0633   PLT 138 (L) 10/25/2015 1048   MCV 89.9 05/30/2020 0633   MCV 85 10/25/2015 1048   MCH 28.4 05/30/2020 0633   MCHC 31.6 05/30/2020 0633   RDW 14.8 05/30/2020 0633   RDW 18.8 (H) 10/25/2015 1048   LYMPHSABS 1.1 05/30/2020 0633   MONOABS 0.6 05/30/2020 0633   EOSABS 0.2 05/30/2020 0633   BASOSABS 0.0 05/30/2020 0633    CMP     Component Value  Date/Time   NA 139 06/13/2020 2126   NA 140 02/14/2016 1050   K 3.5 06/13/2020 2126   CL 105 06/13/2020 2126   CO2 23 05/30/2020 0633   GLUCOSE 90 06/13/2020 2126   BUN 19 06/13/2020 2126   BUN 15 02/14/2016 1050   CREATININE 1.30 (H) 06/13/2020 2126   CALCIUM 9.6 05/30/2020 0633   PROT 7.1 05/04/2020 0550   ALBUMIN 2.9 (L) 05/04/2020 0550   AST 27 05/04/2020 0550   ALT 24 05/04/2020 0550   ALKPHOS 57 05/04/2020 0550   BILITOT 0.5 05/04/2020 0550   GFRNONAA 36 (L) 05/30/2020 0633   GFRAA 42 (L) 05/30/2020 1749   Imaging I have reviewed the images obtained:  CT-scan of the brain-right ACA territory infarcts from the MRI of August 2021 appear to have increased in size on the CT.  An MRI would be best to reliably ascertain the involvement of that area.  Also noted is a new hypodensity in the left cerebellum-unclear significance but MRI would be preferable to further evaluate.  MRI examination of the brain-ordered stat-will addend as soon is able to review.  Assessment:  59 year old with a recent right ACA stroke coming in with worsening of the left facial droop and slurred speech which has been ongoing for a few days but is stark difference was noted after 2 PM today. Outside the window for TPA and also not a candidate for TPA due to recent stroke less than 3 months ago. CT head with possible increase in the size of the right ACA stroke along with a new hypodensity in the left cerebellum.  Question petechial blood in the right ACA territory. MRI ordered stat and pending at this time.  Impression: Right ACA stroke-acute on subacute versus expansion of the subacute stroke. Another differential-stroke symptoms recrudescence New cerebellar hypodensity on the left side-evaluate for another acute/subacute stroke.   Recommendations: Stat MRI brain with and without contrast. Further recommendations based on results.   Addendum-after imaging MRI brain with and without contrast  completed. MRA head also completed. Preliminary review of the images on MRI brain showed multiple abnormalities: -Expansion of the right ACA territory stroke-early  subacute to acute. -New scattered embolic-looking acute/subacute stroke in bilateral hemispheres -Late subacute to chronic but new from August left cerebellar area of hemorrhage-hypertensive hemorrhage versus hemorrhagic transformation of an ischemic stroke -Multiple chronic microhemorrhages in bilateral thalami  Given these new findings, there is strong suspicion that these new findings are likely resultant of an embolic phenomenon versus resultant from accelerated atherosclerosis as well as uncontrolled hypertension.  Impression -Acute on subacute right ACA stroke -Multiple other acute/subacute embolic-looking strokes in bilateral cerebral hemispheres -Subacute to chronic left cerebellar ICH -new from August 2297-LGX certainly not acute. -Sequelae of longstanding hypertension in the form of chronic microhemorrhages seen in the brain. -Hypertensive emergency  Recommendations -I would recommend stepdown level of care admission to medicine for blood pressure management. -Since this is not an acute bleed, I do not want the blood pressures to be lowered extremely low.  I would recommend today's blood pressure goal to be less than 180 followed by a goal of less than 160 over the next 24 hours followed by gradual reduction to 140/90 or below prior to discharge.  Basically treated like hypertensive emergency. -I would recommend that we obtain a TEE on her-2D echocardiogram in August did not show any interatrial shunt or any gross abnormalities. -Given the concern for subacute bleed, chronic microhemorrhages and possible petechial bleed in the ACA territory-I would hold off on any antiplatelets or anticoagulants for now.  Use SCDs for DVT prophylaxis. -Obtain lower extremity venous Dopplers -Blood cultures, UA, CXR. Abd xray -PT OT speech  therapy -N.p.o. until cleared by bedside stroke swallow screen or formal swallow evaluation -Daughter reports that the family is having exceedingly difficult time taking care of her at home.  Might need social work consultation for placement.  Discussed my plan with Dr. Alvino Chapel.  Stroke team will follow.  -- Amie Portland, MD [facility] Pager: [phone] If 7pm to 7am, please call on call as listed on AMION.  CRITICAL CARE ATTESTATION Performed by: Amie Portland, MD Total critical care time:1minutes Critical care time was exclusive of separately billable procedures and treating other patients and/or supervising APPs/Residents/Students Critical care was necessary to treat or prevent imminent or life-threatening deterioration due to ischemic stroke, hypertensive emergency, hemorrhagic transformation of an acute stroke versus subacute to chronic ICH This patient is critically ill and at significant risk for neurological worsening and/or death and care requires constant monitoring. Critical care was time spent personally by me on the following activities: development of treatment plan with patient and/or surrogate as well as nursing, discussions with consultants, evaluation of patient's response to treatment, examination of patient, obtaining history from patient or surrogate, ordering and performing treatments and interventions, ordering and review of laboratory studies, ordering and review of radiographic studies, pulse oximetry, re-evaluation of patient's condition, participation in multidisciplinary rounds and medical decision making of high complexity in the care of this patient.

## 2020-06-13 NOTE — Telephone Encounter (Signed)
[name] is needing the Tramadol  50 mg & Klonopin 0.5 MG refilled. Please advise or prescribe. Please send to [facility] on [address].   Thank you Call back person: [name] (daughter) [phone].

## 2020-06-13 NOTE — Telephone Encounter (Signed)
Missed visit with patient, no answer at the door, will make visit up this week.

## 2020-06-13 NOTE — Telephone Encounter (Signed)
I would like to see her in person first.  Can you please schedule a follow up appointment with me, if one is not available soon, we can change the procedure appointment to a follow up.  Thanks.

## 2020-06-13 NOTE — Code Documentation (Addendum)
Code stroke paged out at 2107, LKW 1400 with facial droop and slurred speech.   Patient arrived at 2112, CBG 88, BP 191/97 with NIH of 10 which what patient left the hospital in august with same NIH.  Patient had stroke in august, which left left side flaccid. Dr Rory Percy determined upon arrival patient not a candidate for tPA.   CT head - hemorrhage, MRI requested, arrived to MRI 2138  MD unsure if stroke expanded.

## 2020-06-14 ENCOUNTER — Inpatient Hospital Stay (HOSPITAL_COMMUNITY): Payer: 59

## 2020-06-14 DIAGNOSIS — I6389 Other cerebral infarction: Secondary | ICD-10-CM

## 2020-06-14 DIAGNOSIS — I639 Cerebral infarction, unspecified: Secondary | ICD-10-CM

## 2020-06-14 DIAGNOSIS — Z8673 Personal history of transient ischemic attack (TIA), and cerebral infarction without residual deficits: Secondary | ICD-10-CM

## 2020-06-14 DIAGNOSIS — I614 Nontraumatic intracerebral hemorrhage in cerebellum: Secondary | ICD-10-CM

## 2020-06-14 LAB — RESPIRATORY PANEL BY RT PCR (FLU A&B, COVID)
Influenza A by PCR: NEGATIVE
Influenza B by PCR: NEGATIVE
SARS Coronavirus 2 by RT PCR: NEGATIVE

## 2020-06-14 LAB — LIPID PANEL
Cholesterol: 132 mg/dL (ref 0–200)
HDL: 73 mg/dL (ref >40)
LDL Cholesterol: 47 mg/dL (ref 0–99)
Total CHOL/HDL Ratio: 1.8 RATIO
Triglycerides: 61 mg/dL (ref <150)
VLDL: 12 mg/dL (ref 0–40)

## 2020-06-14 LAB — SEDIMENTATION RATE: Sed Rate: 67 mm/hr — ABNORMAL HIGH (ref 0–22)

## 2020-06-14 LAB — ECHOCARDIOGRAM COMPLETE
Area-P 1/2: 2.5 cm2
S' Lateral: 3.47 cm

## 2020-06-14 LAB — C-REACTIVE PROTEIN: CRP: 4 mg/dL — ABNORMAL HIGH (ref <1.0)

## 2020-06-14 MED ORDER — SODIUM CHLORIDE 0.9 % IV SOLN: INTRAVENOUS | Status: DC

## 2020-06-14 MED ORDER — LABETALOL HCL 5 MG/ML IV SOLN
5.0000 mg | Freq: Once | INTRAVENOUS | Status: AC
  Administered 2020-06-14: 5 mg via INTRAVENOUS

## 2020-06-14 MED ORDER — ACETAMINOPHEN 325 MG PO TABS
650.0000 mg | ORAL_TABLET | ORAL | Status: DC | PRN
  Administered 2020-06-15: 650 mg via ORAL
  Filled 2020-06-14: qty 2

## 2020-06-14 MED ORDER — ACETAMINOPHEN 650 MG RE SUPP: 650.0000 mg | RECTAL | Status: DC | PRN

## 2020-06-14 MED ORDER — ACETAMINOPHEN 160 MG/5ML PO SOLN: 650.0000 mg | ORAL | Status: DC | PRN

## 2020-06-14 MED ORDER — ASPIRIN EC 81 MG PO TBEC
81.0000 mg | DELAYED_RELEASE_TABLET | Freq: Every day | ORAL | Status: DC
  Administered 2020-06-15 – 2020-06-16 (×2): 81 mg via ORAL
  Filled 2020-06-14 (×2): qty 1

## 2020-06-14 MED ORDER — SENNOSIDES-DOCUSATE SODIUM 8.6-50 MG PO TABS
1.0000 | ORAL_TABLET | Freq: Every evening | ORAL | Status: DC | PRN
  Filled 2020-06-14: qty 1

## 2020-06-14 MED ORDER — CLONIDINE HCL 0.2 MG PO TABS
0.2000 mg | ORAL_TABLET | Freq: Two times a day (BID) | ORAL | Status: DC
  Administered 2020-06-14: 0.2 mg via ORAL
  Filled 2020-06-14: qty 1

## 2020-06-14 MED ORDER — LABETALOL HCL 5 MG/ML IV SOLN
10.0000 mg | INTRAVENOUS | Status: DC | PRN
  Administered 2020-06-15 (×2): 10 mg via INTRAVENOUS
  Filled 2020-06-14: qty 4

## 2020-06-14 MED ORDER — METOPROLOL TARTRATE 25 MG PO TABS
25.0000 mg | ORAL_TABLET | Freq: Two times a day (BID) | ORAL | Status: DC
  Administered 2020-06-14 – 2020-06-16 (×6): 25 mg via ORAL
  Filled 2020-06-14 (×6): qty 1

## 2020-06-14 MED ORDER — LABETALOL HCL 5 MG/ML IV SOLN
10.0000 mg | INTRAVENOUS | Status: DC | PRN
  Administered 2020-06-14 (×2): 10 mg via INTRAVENOUS
  Filled 2020-06-14 (×3): qty 4

## 2020-06-14 MED ORDER — ATORVASTATIN CALCIUM 40 MG PO TABS
40.0000 mg | ORAL_TABLET | Freq: Every day | ORAL | Status: DC
  Administered 2020-06-14 – 2020-06-18 (×5): 40 mg via ORAL
  Filled 2020-06-14 (×5): qty 1

## 2020-06-14 MED ORDER — STROKE: EARLY STAGES OF RECOVERY BOOK
Freq: Once | Status: DC
  Filled 2020-06-14 (×2): qty 1

## 2020-06-14 MED ORDER — ISOSORBIDE MONONITRATE ER 60 MG PO TB24
60.0000 mg | ORAL_TABLET | Freq: Every day | ORAL | Status: DC
  Administered 2020-06-14 – 2020-06-18 (×5): 60 mg via ORAL
  Filled 2020-06-14: qty 2
  Filled 2020-06-14 (×4): qty 1

## 2020-06-14 MED ORDER — AMLODIPINE BESYLATE 10 MG PO TABS
10.0000 mg | ORAL_TABLET | Freq: Every day | ORAL | Status: DC
  Administered 2020-06-14 – 2020-06-18 (×5): 10 mg via ORAL
  Filled 2020-06-14: qty 2
  Filled 2020-06-14 (×4): qty 1

## 2020-06-14 MED ORDER — HYDRALAZINE HCL 50 MG PO TABS
100.0000 mg | ORAL_TABLET | Freq: Three times a day (TID) | ORAL | Status: DC
  Administered 2020-06-14 (×2): 100 mg via ORAL
  Filled 2020-06-14: qty 2

## 2020-06-14 NOTE — Telephone Encounter (Signed)
[name] is currently readmitted to the hospital, she went to the ED yesterday with possible stroke symptoms.  I have advised [name] to stop the meloxicam and Dr Posey Pronto will see her 06/23/20 arrive by 11:20 am.

## 2020-06-14 NOTE — Progress Notes (Signed)
    [facility] has been requested to perform a transesophageal echocardiogram on [name] for CVA.  After careful review of history and examination, the risks and benefits of transesophageal echocardiogram have been explained including risks of esophageal damage, perforation (1:10,000 risk), bleeding, pharyngeal hematoma as well as other potential complications associated with conscious sedation including aspiration, arrhythmia, respiratory failure and death. Alternatives to treatment were discussed, questions were answered. Patient is willing to proceed.   Cecilie Kicks, NP  06/14/2020 1:38 PM

## 2020-06-14 NOTE — Progress Notes (Signed)
  Echocardiogram 2D Echocardiogram has been performed.  [name] 06/14/2020, 12:44 PM

## 2020-06-14 NOTE — Evaluation (Signed)
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 MRI reveals expansion of acute vs subacute R ACA CVA, new embolic acute vs subacute CVAs in bilateral hemispheres, and chronic-appearing L cerebellar ICH either due to hemorrhage or conversion of ischemic CVA (new since 04/2020).  Clinical Impression   Pt presents with L hemiplegia present since previous CVA in August 2021, expressive difficulties, abnormal LUE/LLE pain sensation, difficulty performing bed-level mobility, and poor sitting balance. Pt to benefit from acute PT to address deficits. Pt required max-total assist for rolling and moving to/from EOB, pt tolerating EOB sitting x5 minutes limited by fatigue and LLE pain. Pt has been total care at home, per chart review family is having difficulty managing this. PT recommending SNF level of care post-acutely. PT to progress mobility as tolerated, and will continue to follow acutely.      Follow Up Recommendations SNF;Supervision/Assistance - 24 hour    Equipment Recommendations  None recommended by PT    Recommendations for Other Services       Precautions / Restrictions Precautions Precautions: Fall Precaution Comments: L hemiplegia, L inattention, pushing tendency with RUE to weak L side Restrictions Weight Bearing Restrictions: No      Mobility  Bed Mobility Overal bed mobility: Needs Assistance Bed Mobility: Rolling;Supine to Sit;Sit to Supine Rolling: Max assist   Supine to sit: Total assist Sit to supine: Total assist   General bed mobility comments: Max assist for rolling towards R for trunk and hip translation, pt reaching for left railing to assist. Total assist for supine<>sit for trunk and LE management, scooting to and from EOB,  steadying in sitting.  Transfers                 General transfer comment: NT  Ambulation/Gait                Stairs            Wheelchair Mobility    Modified Rankin (Stroke Patients Only)       Balance Overall balance assessment: Needs assistance Sitting-balance support: Single extremity supported;Feet supported Sitting balance-Leahy Scale: Poor Sitting balance - Comments: L and posterior leaning in sitting, requires at least min posterior assist to maintain upright sitting balance Postural control: Posterior lean;Left lateral lean                                   Pertinent Vitals/Pain Pain Assessment: 0-10 Pain Location: L arm, leg Pain Descriptors / Indicators: Sore Pain Intervention(s): Limited activity within patient's tolerance;Monitored during session;Repositioned    Home Living Family/patient expects to be discharged to:: Private residence Living Arrangements: Spouse/significant other;Children (daughter, fiance there 24/7) Available Help at Discharge: Family;Available 24 hours/day;Other (Comment) Type of Home: House Home Access: Stairs to enter Entrance Stairs-Rails: Right;Left;Can reach both Entrance Stairs-Number of Steps: 3-4 steps (family carries her or she is lifted) Home Layout: One level Home Equipment: Bedside commode;Wheelchair - Education administrator (comment) Additional Comments: hoyer lift, chair lift    Prior Function Level of Independence: Needs assistance   Gait / Transfers Assistance Needed: pt reports transfer with hoyer lift  ADL's / Homemaking Assistance Needed: Pt needing assist for dressing, bathing, eating; total care. Pt's daughter  is a CNA per pt report        Hand Dominance   Dominant Hand: Right    Extremity/Trunk Assessment   Upper Extremity Assessment Upper Extremity Assessment: Defer to OT evaluation LUE Deficits / Details: 0/5 - no active movement LUE LUE Sensation:  (painful since CVA)     Lower Extremity Assessment Lower Extremity Assessment: LLE deficits/detail;RLE deficits/detail RLE Deficits / Details: Able to perform hip and knee flexion/extension, minimal PF/DF LLE Deficits / Details: 0/5 - no active movement. Increased tone with AROM LLE Sensation:  (painful sensation since CVA in August 2021)       Communication   Communication: Expressive difficulties  Cognition Arousal/Alertness: Awake/alert Behavior During Therapy: Restless Overall Cognitive Status: No family/caregiver present to determine baseline cognitive functioning Area of Impairment: Orientation;Attention;Memory;Following commands;Problem solving                 Orientation Level: Disoriented to;Place;Situation Current Attention Level: Sustained Memory: Decreased short-term memory Following Commands: Follows one step commands consistently;Follows one step commands with increased time     Problem Solving: Slow processing;Decreased initiation;Difficulty sequencing;Requires verbal cues;Requires tactile cues General Comments: Pt states she is at home, that she just got back from the hospital, and she is here because she has blood clots in her L arm and leg ([place] negative for DVT). Pt repeats statements frequently, suspect secondary to expressive changes post-CVA. Pt requires step-by-step multimodal cuing for mobiity.      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)    Exercises     Assessment/Plan    PT Assessment Patient needs continued PT services  PT Problem List Decreased strength;Decreased mobility;Decreased safety awareness;Decreased balance;Pain;Decreased coordination;Impaired tone;Impaired sensation;Decreased cognition;Decreased range of motion;Decreased activity tolerance       PT Treatment Interventions DME instruction;Therapeutic activities;Therapeutic exercise;Patient/family education;Balance  training;Functional mobility training;Neuromuscular re-education    PT Goals (Current goals can be found in the Care Plan section)  Acute Rehab PT Goals Patient Stated Goal: none stated PT Goal Formulation: With patient Time For Goal Achievement: 06/28/20 Potential to Achieve Goals: Fair    Frequency Min 3X/week   Barriers to discharge        Co-evaluation               AM-PAC PT "6 Clicks" Mobility  Outcome Measure Help needed turning from your back to your side while in a flat bed without using bedrails?: Total Help needed moving from lying on your back to sitting on the side of a flat bed without using bedrails?: Total Help needed moving to and from a bed to a chair (including a wheelchair)?: Total Help needed standing up from a chair using your arms (e.g., wheelchair or bedside chair)?: Total Help needed to walk in hospital room?: Total Help needed climbing 3-5 steps with a railing? : Total 6 Click Score: 6    End of Session   Activity Tolerance: Patient limited by fatigue;Patient limited by pain Patient left: in bed;with call bell/phone within reach Nurse Communication: Mobility status PT Visit Diagnosis: Hemiplegia and hemiparesis;Muscle weakness (generalized) (M62.81) Hemiplegia - Right/Left: Left Hemiplegia - dominant/non-dominant: Non-dominant Hemiplegia - caused by: Cerebral infarction    Time: 8315-1761 PT Time Calculation (min) (ACUTE ONLY): 21 min   Charges:   PT Evaluation $PT Eval Low Complexity: 1 Low        Ayana Imhof E, PT Acute Rehabilitation Services Pager [phone]  Office [phone]    Amiree No D  Rahima Fleishman 06/14/2020, 4:01 PM

## 2020-06-14 NOTE — ED Notes (Signed)
[name] daug 336 [phone]

## 2020-06-14 NOTE — Progress Notes (Signed)
Lower extremity venous has been completed.   Preliminary results in CV Proc.   Abram Sander 06/14/2020 11:22 AM

## 2020-06-14 NOTE — Progress Notes (Signed)
STROKE TEAM PROGRESS NOTE   SUBJECTIVE (INTERVAL HISTORY) No family is at the bedside.  Overall her condition is stable.  Patient lying in bed, awake alert, eyes open.  Still has left sided weakness.  On presentation, patient has high BP, however, on my encounter, patient blood pressure was on the lowish end.  Started on IV fluid and adjusted BP meds.  TEE planned for tomorrow.  Patient passed a swallow, will put on diet.  N.p.o. after midnight.  OBJECTIVE Temp:  [98.2 F (36.8 C)-99 F (37.2 C)] 99 F (37.2 C) (10/05 1707) Pulse Rate:  [60-92] 77 (10/05 1707) Cardiac Rhythm: Normal sinus rhythm (10/05 1713) Resp:  [7-26] 17 (10/05 1707) BP: (103-215)/(65-112) 130/78 (10/05 1707) SpO2:  [98 %-100 %] 100 % (10/05 1707)  Recent Labs  Lab 06/13/20 2115  GLUCAP 88   Recent Labs  Lab 06/13/20 2115 06/13/20 2126  NA 138 139  K 3.8 3.5  CL 104 105  CO2 20*  --   GLUCOSE 85 90  BUN 18 19  CREATININE 1.34* 1.30*  CALCIUM 10.3  --    Recent Labs  Lab 06/13/20 2115  AST 20  ALT 26  ALKPHOS 83  BILITOT 0.6  PROT 7.8  ALBUMIN 3.4*   Recent Labs  Lab 06/13/20 2115 06/13/20 2126  WBC 8.7  --   NEUTROABS 5.7  --   HGB 12.2 12.9  HCT 38.0 38.0  MCV 89.8  --   PLT 179  --    No results for input(s): CKTOTAL, CKMB, CKMBINDEX, TROPONINI in the last 168 hours. Recent Labs    06/13/20 2115  LABPROT 12.5  INR 1.0   No results for input(s): COLORURINE, LABSPEC, PHURINE, GLUCOSEU, HGBUR, BILIRUBINUR, KETONESUR, PROTEINUR, UROBILINOGEN, NITRITE, LEUKOCYTESUR in the last 72 hours.  Invalid input(s): APPERANCEUR     Component Value Date/Time   CHOL 132 06/13/2020 2115   TRIG 61 06/13/2020 2115   HDL 73 06/13/2020 2115   CHOLHDL 1.8 06/13/2020 2115   VLDL 12 06/13/2020 2115   LDLCALC 47 06/13/2020 2115   Lab Results  Component Value Date   HGBA1C 5.9 (H) 04/28/2020      Component Value Date/Time   LABOPIA NONE DETECTED 04/27/2020 0914   COCAINSCRNUR NONE DETECTED  04/27/2020 0914   LABBENZ NONE DETECTED 04/27/2020 0914   AMPHETMU NONE DETECTED 04/27/2020 0914   THCU NONE DETECTED 04/27/2020 0914   LABBARB NONE DETECTED 04/27/2020 0914    No results for input(s): ETH in the last 168 hours.  I have personally reviewed the radiological images below and agree with the radiology interpretations.  DG Chest 1 View  Result Date: 06/14/2020 CLINICAL DATA:  Vomiting.  Altered mental status. EXAM: CHEST  1 VIEW COMPARISON:  05/22/2020 FINDINGS: The heart is enlarged but stable from prior study. Both lungs are clear. The visualized skeletal structures are unremarkable. There are atherosclerotic changes of the thoracic aorta. IMPRESSION: No active disease. Electronically Signed   By: Constance Holster M.D.   On: 06/14/2020 00:47   DG Abd 1 View  Result Date: 06/14/2020 CLINICAL DATA:  Altered mental status with nausea and vomiting. EXAM: ABDOMEN - 1 VIEW COMPARISON:  05/23/2020 FINDINGS: The bowel gas pattern is normal. No radio-opaque calculi or other significant radiographic abnormality are seen. IMPRESSION: Negative. Electronically Signed   By: Constance Holster M.D.   On: 06/14/2020 00:48   DG Abd 1 View  Result Date: 05/23/2020 CLINICAL DATA:  Abdominal pain, mid to lower EXAM: ABDOMEN -  1 VIEW COMPARISON:  None. FINDINGS: No dilated small bowel loops. Mild colonic stool. No evidence of pneumatosis or pneumoperitoneum. Surgical clips are seen in the right upper quadrant of the abdomen. Clear lung bases. Lower lumbar spondylosis. IMPRESSION: Nonobstructive bowel gas pattern. Electronically Signed   By: Ilona Sorrel M.D.   On: 05/23/2020 11:22   MR ANGIO HEAD WO CONTRAST  Result Date: 06/13/2020 CLINICAL DATA:  Initial evaluation for neuro deficit, stroke suspected. History of recent right ACA territory infarct. EXAM: MRI HEAD WITHOUT AND WITH CONTRAST MRA HEAD WITHOUT CONTRAST TECHNIQUE: Multiplanar, multiecho pulse sequences of the brain and surrounding  structures were obtained without and with intravenous contrast. Angiographic images of the head were obtained using MRA technique without contrast. CONTRAST:  6.42m GADAVIST GADOBUTROL 1 MMOL/ML IV SOLN COMPARISON:  Comparison made with CT from earlier same day as well as previous CTA and MRI from 04/27/2020. FINDINGS: MRI HEAD FINDINGS Brain: Diffuse prominence of the CSF containing spaces compatible with generalized age-related cerebral atrophy. Patchy and confluent T2/FLAIR hyperintensity within the periventricular and deep white matter both cerebral hemispheres most consistent with chronic small vessel ischemic disease, advanced in nature. Patchy involvement of the deep gray nuclei, brainstem, and cerebellum noted as well. Few scattered remote lacunar infarcts present about the hemispheric cerebral white matter and thalami. 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 Associated enhancement seen following contrast administration. No associated hemorrhage or significant mass effect. Few additional scattered foci of restricted diffusion involving the subcortical posterior left frontal region, right periatrial white matter, and splenium of the corpus callosum also consistent with acute to subacute ischemic infarcts. No associated hemorrhage. These findings are new as compared to prior exam. 1.4 x 2.3 x 1.7 cm well-circumscribed lobulated T1/FLAIR hyperintense lesion seen involving the left cerebellum (series 4, image 7). Lesion demonstrates fairly avid root restricted diffusion. No significant associated enhancement or regional mass effect. Finding favored to reflect an evolving subacute hematoma, new as compared to prior MRI. Finding is difficult to see on prior CT performed earlier the same day. No other findings  to suggest acute or subacute intracranial hemorrhage. Multiple additional chronic microhemorrhages noted, predominantly clustered about the thalami, favored to be related to chronic underlying poorly controlled hypertension given distribution. This additional finding suggests that the left cerebellar hematoma may be hypertensive in nature as well. No mass lesion, mass effect, or midline shift. No hydrocephalus or extra-axial fluid collection. No other abnormal enhancement. Pituitary gland and suprasellar region within normal limits. Midline structures intact. Vascular: Major intracranial vascular flow voids are maintained. Skull and upper cervical spine: Craniocervical junction within normal limits. Bone marrow signal intensity normal. No scalp soft tissue abnormality. Sinuses/Orbits: Globes and orbital soft tissues within normal limits. Left maxillary sinus retention cyst. Paranasal sinuses are otherwise clear. No mastoid effusion. Other: None. MRA HEAD FINDINGS ANTERIOR CIRCULATION: Examination moderately degraded by motion artifact. Visualized distal cervical segments of the internal carotid arteries are patent with symmetric antegrade flow. Petrous segments patent bilaterally. Scattered atheromatous change noted throughout the carotid siphons, grossly similar to previous CTA. A1 segments patent bilaterally. Normal anterior communicating artery complex. Left ACA widely patent. Right A2 segment hypoplastic and diminutive, but remains grossly patent. M1 segments patent bilaterally. Normal MCA bifurcations. Distal MCA branches well perfused and symmetric. POSTERIOR CIRCULATION: Vertebral arteries patent to the vertebrobasilar junction without stenosis.  Left PICA patent at its origin. Right PICA not seen. Basilar grossly patent to its distal aspect without appreciable stenosis. Superior cerebral arteries patent bilaterally. Both PCAs primarily supplied via the basilar. Scattered atheromatous change about the PCAs  bilaterally without occlusion, grossly similar to previous. No appreciable aneurysm. IMPRESSION: MRI HEAD IMPRESSION: 1. 1.4 x 2.3 x 1.7 cm well-circumscribed lobulated T1/FLAIR hyperintense lesion involving the left cerebellum, favored to reflect an evolving subacute hematoma, new as compared to prior MRI. No significant regional mass effect. Finding suspected to be hypertensive in nature. 2. Interval expansion and enlargement of previously identified right ACA territory infarct, now moderate in size and acute to subacute in appearance. No significant mass effect. 3. Few additional scattered acute to subacute ischemic nonhemorrhagic infarcts involving the subcortical posterior left frontal region, right periatrial white matter, and splenium of the corpus callosum. 4. Underlying age-related cerebral atrophy with advanced chronic microvascular ischemic disease. 5. Multiple chronic microhemorrhages, predominantly clustered about the thalami, favored to be related to chronic poorly controlled hypertension given distribution. 6. Underlying atrophy with advanced chronic small vessel ischemic disease. MRA HEAD IMPRESSION: 1. Motion degraded exam. 2. Grossly stable appearance of the intracranial circulation. No large vessel occlusion. Scattered atheromatous change about the carotid siphons, right ACA, and PCAs. No definite new or progressive finding. Electronically Signed   By: Jeannine Boga M.D.   On: 06/13/2020 23:29   MR BRAIN W WO CONTRAST  Result Date: 06/13/2020 CLINICAL DATA:  Initial evaluation for neuro deficit, stroke suspected. History of recent right ACA territory infarct. EXAM: MRI HEAD WITHOUT AND WITH CONTRAST MRA HEAD WITHOUT CONTRAST TECHNIQUE: Multiplanar, multiecho pulse sequences of the brain and surrounding structures were obtained without and with intravenous contrast. Angiographic images of the head were obtained using MRA technique without contrast. CONTRAST:  6.8m GADAVIST GADOBUTROL 1  MMOL/ML IV SOLN COMPARISON:  Comparison made with CT from earlier same day as well as previous CTA and MRI from 04/27/2020. FINDINGS: MRI HEAD FINDINGS Brain: Diffuse prominence of the CSF containing spaces compatible with generalized age-related cerebral atrophy. Patchy and confluent T2/FLAIR hyperintensity within the periventricular and deep white matter both cerebral hemispheres most consistent with chronic small vessel ischemic disease, advanced in nature. Patchy involvement of the deep gray nuclei, brainstem, and cerebellum noted as well. Few scattered remote lacunar infarcts present about the hemispheric cerebral white matter and thalami. 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 Associated enhancement seen following contrast administration. No associated hemorrhage or significant mass effect. Few additional scattered foci of restricted diffusion involving the subcortical posterior left frontal region, right periatrial white matter, and splenium of the corpus callosum also consistent with acute to subacute ischemic infarcts. No associated hemorrhage. These findings are new as compared to prior exam. 1.4 x 2.3 x 1.7 cm well-circumscribed lobulated T1/FLAIR hyperintense lesion seen involving the left cerebellum (series 4, image 7). Lesion demonstrates fairly avid root restricted diffusion. No significant associated enhancement or regional mass effect. Finding favored to reflect an evolving subacute hematoma, new as compared to prior MRI. Finding is difficult to see on prior CT performed earlier the same day. No other findings to suggest acute or subacute intracranial hemorrhage. Multiple additional chronic microhemorrhages noted, predominantly clustered about the thalami, favored to be related to chronic  underlying poorly controlled hypertension given distribution. This additional finding suggests that the left  cerebellar hematoma may be hypertensive in nature as well. No mass lesion, mass effect, or midline shift. No hydrocephalus or extra-axial fluid collection. No other abnormal enhancement. Pituitary gland and suprasellar region within normal limits. Midline structures intact. Vascular: Major intracranial vascular flow voids are maintained. Skull and upper cervical spine: Craniocervical junction within normal limits. Bone marrow signal intensity normal. No scalp soft tissue abnormality. Sinuses/Orbits: Globes and orbital soft tissues within normal limits. Left maxillary sinus retention cyst. Paranasal sinuses are otherwise clear. No mastoid effusion. Other: None. MRA HEAD FINDINGS ANTERIOR CIRCULATION: Examination moderately degraded by motion artifact. Visualized distal cervical segments of the internal carotid arteries are patent with symmetric antegrade flow. Petrous segments patent bilaterally. Scattered atheromatous change noted throughout the carotid siphons, grossly similar to previous CTA. A1 segments patent bilaterally. Normal anterior communicating artery complex. Left ACA widely patent. Right A2 segment hypoplastic and diminutive, but remains grossly patent. M1 segments patent bilaterally. Normal MCA bifurcations. Distal MCA branches well perfused and symmetric. POSTERIOR CIRCULATION: Vertebral arteries patent to the vertebrobasilar junction without stenosis. Left PICA patent at its origin. Right PICA not seen. Basilar grossly patent to its distal aspect without appreciable stenosis. Superior cerebral arteries patent bilaterally. Both PCAs primarily supplied via the basilar. Scattered atheromatous change about the PCAs bilaterally without occlusion, grossly similar to previous. No appreciable aneurysm. IMPRESSION: MRI HEAD IMPRESSION: 1. 1.4 x 2.3 x 1.7 cm well-circumscribed lobulated T1/FLAIR  hyperintense lesion involving the left cerebellum, favored to reflect an evolving subacute hematoma, new as compared to prior MRI. No significant regional mass effect. Finding suspected to be hypertensive in nature. 2. Interval expansion and enlargement of previously identified right ACA territory infarct, now moderate in size and acute to subacute in appearance. No significant mass effect. 3. Few additional scattered acute to subacute ischemic nonhemorrhagic infarcts involving the subcortical posterior left frontal region, right periatrial white matter, and splenium of the corpus callosum. 4. Underlying age-related cerebral atrophy with advanced chronic microvascular ischemic disease. 5. Multiple chronic microhemorrhages, predominantly clustered about the thalami, favored to be related to chronic poorly controlled hypertension given distribution. 6. Underlying atrophy with advanced chronic small vessel ischemic disease. MRA HEAD IMPRESSION: 1. Motion degraded exam. 2. Grossly stable appearance of the intracranial circulation. No large vessel occlusion. Scattered atheromatous change about the carotid siphons, right ACA, and PCAs. No definite new or progressive finding. Electronically Signed   By: Jeannine Boga M.D.   On: 06/13/2020 23:29   MR PELVIS WO CONTRAST  Result Date: 05/28/2020 CLINICAL DATA:  Pelvic pain. EXAM: MRI PELVIS WITHOUT CONTRAST TECHNIQUE: Multiplanar multisequence MR imaging of the pelvis was performed. No intravenous contrast was administered. COMPARISON:  None. FINDINGS: Bones: No hip fracture, dislocation or avascular necrosis. No periosteal reaction or bone destruction. No aggressive osseous lesion. Normal sacrum and sacroiliac joints. No SI joint widening or erosive changes. Degenerative disc disease with disc height loss at L5-S1. minimal grade 1 anterolisthesis of L4 on L5 secondary to moderate bilateral facet arthropathy. Bilateral foraminal narrowing at L4-5 with mild spinal  stenosis. Articular cartilage and labrum Articular cartilage:  No chondral defect. Labrum: Grossly intact, but evaluation is limited by lack of intraarticular fluid. Joint or bursal effusion Joint effusion:  No hip joint effusion.  No SI joint effusion. Bursae:  No bursa formation. Muscles and tendons Flexors: Normal. Extensors: Normal. Abductors: Normal. Adductors: Mild muscle edema in the left adductor musculature likely reflecting muscle strain versus myositis. Gluteals: Mild muscle edema in the gluteus minimus and medius muscle bilaterally  which may reflect mild myositis or muscle strain, left greater than right. Hamstrings: Normal. Other findings No pelvic free fluid. No fluid collection or hematoma. No inguinal lymphadenopathy. No inguinal hernia. Soft tissue edema in the subcutaneous fat superficial to the gluteus maximus muscle bilaterally likely reflecting contusions or fat necrosis. IMPRESSION: 1. No hip fracture, dislocation or avascular necrosis. 2. Mild muscle edema in the left adductor musculature likely reflecting muscle strain versus myositis. 3. Mild muscle edema in the gluteus minimus and medius muscle bilaterally which may reflect mild myositis or muscle strain, left greater than right. 4. Soft tissue edema in the subcutaneous fat superficial to the gluteus maximus muscle bilaterally likely reflecting contusions or fat necrosis. Electronically Signed   By: Kathreen Devoid   On: 05/28/2020 09:58   DG CHEST PORT 1 VIEW  Result Date: 05/22/2020 CLINICAL DATA:  Persistent cough EXAM: PORTABLE CHEST 1 VIEW COMPARISON:  09/19/2015 FINDINGS: No focal opacity or pleural effusion. Mild cardiomegaly. Aortic atherosclerosis. No pneumothorax. Skin fold artifact over the right lower chest. IMPRESSION: No active disease.  Mild cardiomegaly. Electronically Signed   By: Donavan Foil M.D.   On: 05/22/2020 19:47   ECHOCARDIOGRAM COMPLETE  Result Date: 06/14/2020    ECHOCARDIOGRAM REPORT   Patient Name:    [name] Date of Exam: 06/14/2020 Medical Rec #:  [id number]           Height:       63.0 in Accession #:    [id number]          Weight:       145.0 lb Date of Birth:  October 28, 1960           BSA:          1.687 m Patient Age:    62 years            BP:           190/105 mmHg Patient Gender: F                   HR:           75 bpm. Exam Location:  Inpatient Procedure: 2D Echo, Cardiac Doppler and Color Doppler Indications:    Stroke 434.91 / I163.9  History:        Patient has prior history of Echocardiogram examinations, most                 recent 04/27/2020. Stroke; Risk Factors:Hypertension.  Sonographer:    Bernadene Person RDCS Referring Phys: [id number] Cleaster Corin PATEL  Sonographer Comments: patient on right side IMPRESSIONS  1. Intracavitary gradient, peak velocity 2.38 m/s. Peak gradient 22.7 mmHg.Marland Kitchen Left ventricular ejection fraction, by estimation, is 60 to 65%. The left ventricle has normal function. The left ventricle has no regional wall motion abnormalities. There is mild concentric left ventricular hypertrophy. Left ventricular diastolic parameters are consistent with Grade I diastolic dysfunction (impaired relaxation). Elevated left ventricular end-diastolic pressure.  2. Right ventricular systolic function is normal. The right ventricular size is normal. There is normal pulmonary artery systolic pressure.  3. Left atrial size was mildly dilated.  4. The mitral valve is normal in structure. No evidence of mitral valve regurgitation. No evidence of mitral stenosis.  5. The aortic valve is normal in structure. Aortic valve regurgitation is not visualized. No aortic stenosis is present.  6. The inferior vena cava is normal in size with greater than 50% respiratory variability, suggesting right atrial pressure of 3 mmHg.  FINDINGS  Left Ventricle: Intracavitary gradient, peak velocity 2.38 m/s. Peak gradient 22.7 mmHg. Left ventricular ejection fraction, by estimation, is 60 to 65%. The left ventricle has  normal function. The left ventricle has no regional wall motion abnormalities. The left ventricular internal cavity size was normal in size. There is mild concentric left ventricular hypertrophy. Left ventricular diastolic parameters are consistent with Grade I diastolic dysfunction (impaired relaxation). Elevated left ventricular end-diastolic pressure. Right Ventricle: The right ventricular size is normal. No increase in right ventricular wall thickness. Right ventricular systolic function is normal. There is normal pulmonary artery systolic pressure. The tricuspid regurgitant velocity is 1.24 m/s, and  with an assumed right atrial pressure of 3 mmHg, the estimated right ventricular systolic pressure is 9.2 mmHg. Left Atrium: Left atrial size was mildly dilated. Right Atrium: Right atrial size was normal in size. Pericardium: There is no evidence of pericardial effusion. Mitral Valve: The mitral valve is normal in structure. No evidence of mitral valve regurgitation. No evidence of mitral valve stenosis. Tricuspid Valve: The tricuspid valve is normal in structure. Tricuspid valve regurgitation is trivial. No evidence of tricuspid stenosis. Aortic Valve: The aortic valve is normal in structure. Aortic valve regurgitation is not visualized. No aortic stenosis is present. Pulmonic Valve: The pulmonic valve was normal in structure. Pulmonic valve regurgitation is not visualized. No evidence of pulmonic stenosis. Aorta: The aortic root is normal in size and structure. Venous: The inferior vena cava is normal in size with greater than 50% respiratory variability, suggesting right atrial pressure of 3 mmHg. IAS/Shunts: No atrial level shunt detected by color flow Doppler.  LEFT VENTRICLE PLAX 2D LVIDd:         4.88 cm  Diastology LVIDs:         3.47 cm  LV e' medial:    3.65 cm/s LV PW:         1.23 cm  LV E/e' medial:  15.3 LV IVS:        1.07 cm  LV e' lateral:   3.97 cm/s LVOT diam:     1.90 cm  LV E/e' lateral: 14.1  LV SV:         73 LV SV Index:   44 LVOT Area:     2.84 cm  RIGHT VENTRICLE RV S prime:     15.80 cm/s TAPSE (M-mode): 1.7 cm LEFT ATRIUM             Index       RIGHT ATRIUM           Index LA diam:        3.50 cm 2.08 cm/m  RA Area:     13.20 cm LA Vol (A2C):   59.9 ml 35.51 ml/m RA Volume:   29.70 ml  17.61 ml/m LA Vol (A4C):   40.1 ml 23.78 ml/m LA Biplane Vol: 50.2 ml 29.76 ml/m  AORTIC VALVE LVOT Vmax:   155.00 cm/s LVOT Vmean:  121.000 cm/s LVOT VTI:    0.259 m  AORTA Ao Root diam: 2.70 cm Ao Asc diam:  2.60 cm MITRAL VALVE               TRICUSPID VALVE MV Area (PHT): 2.50 cm    TR Peak grad:   6.2 mmHg MV Decel Time: 303 msec    TR Vmax:        124.00 cm/s MV E velocity: 56.00 cm/s MV A velocity: 74.40 cm/s  SHUNTS MV E/A ratio:  0.75  Systemic VTI:  0.26 m                            Systemic Diam: 1.90 cm Skeet Latch MD Electronically signed by Skeet Latch MD Signature Date/Time: 06/14/2020/1:15:36 PM    Final    DG HIP UNILAT WITH PELVIS 2-3 VIEWS LEFT  Result Date: 05/18/2020 CLINICAL DATA:  Left groin pain. EXAM: DG HIP (WITH OR WITHOUT PELVIS) 2-3V LEFT COMPARISON:  None. FINDINGS: There is no evidence of hip fracture or dislocation. There is no evidence of arthropathy or other focal bone abnormality. IMPRESSION: Negative. Electronically Signed   By: Kathreen Devoid   On: 05/18/2020 10:39   CT HEAD CODE STROKE WO CONTRAST  Result Date: 06/13/2020 CLINICAL DATA:  Code stroke. Initial evaluation for acute left facial droop, slurred speech. Patient has a history of a recent right ACA territory infarct. EXAM: CT HEAD WITHOUT CONTRAST TECHNIQUE: Contiguous axial images were obtained from the base of the skull through the vertex without intravenous contrast. COMPARISON:  Prior CT from 09/19/2015. Additionally, there is a previous brain MRI from 04/27/2020, reviewed in epic, but not available for review in PACS. FINDINGS: Brain: Generalized age-related cerebral atrophy. Patchy and  confluent hypodensity involving the periventricular deep white matter of both cerebral hemispheres most consistent with chronic small vessel ischemic disease. Patchy involvement of the deep gray nuclei, brainstem, and cerebellum noted as well. Appearance is fairly advanced in nature. Few scattered remote lacunar infarcts noted about the bilateral thalami. There is cortical and subcortical hypodensity involving the parasagittal right frontoparietal region, consistent with right ACA territory ischemia. Finding corresponds with previously identified right ACA territory infarct. However, appearance is somewhat more prominent on current exam as compared to previous brain MRI, raising the possibility for possible interval expansion and/or new acute component. Punctate hyperdensity at the posterior aspect of this area of ischemia suspicious for a small focus of associated petechial hemorrhage (series 3, image 23). No frank hemorrhagic transformation or mass effect. No other evidence for acute large vessel territory infarct. Gray-white matter differentiation otherwise grossly maintained. No mass lesion or midline shift. No hydrocephalus or extra-axial fluid collection. Vascular: No hyperdense vessel. Scattered vascular calcifications noted within the carotid siphons. Skull: Scalp soft tissues and calvarium within normal limits. Sinuses/Orbits: Globes and orbital soft tissues demonstrate no acute finding. Left maxillary sinus retention cyst noted. Paranasal sinuses are otherwise largely clear. No mastoid effusion. Other: Poor dentition noted. ASPECTS [facility] Stroke Program Early CT Score) - Ganglionic level infarction (caudate, lentiform nuclei, internal capsule, insula, M1-M3 cortex): 7 - Supraganglionic infarction (M4-M6 cortex): 3 Total score (0-10 with 10 being normal): 10 IMPRESSION: 1. Cortical and subcortical hypodensity involving the parasagittal right frontoparietal region, consistent with previously identified  right ACA territory infarct. Appearance is somewhat more prominent on current exam as compared to previous brain MRI, raising the possibility for possible interval expansion and/or new acute component. Punctate hyperdensity at the posterior aspect of this area of ischemia suspicious for a small focus of associated petechial hemorrhage. No frank hemorrhagic transformation or mass effect. 2. ASPECTS is 10. 3. 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 These results were communicated to Dr. Rory Percy at 9:35 pmon 10/4/2021by text page via the [facility] messaging system. Findings also discussed by telephone with Dr. Rory Percy at time of dictation. Electronically Signed   By: Jeannine Boga M.D.   On: 06/13/2020 21:49   VAS [place] LOWER EXTREMITY VENOUS (  DVT)  Result Date: 06/14/2020  Lower Venous DVTStudy Indications: Stroke.  Comparison Study: no prior Performing Technologist: Abram Sander RVS  Examination Guidelines: A complete evaluation includes B-mode imaging, spectral Doppler, color Doppler, and power Doppler as needed of all accessible portions of each vessel. Bilateral testing is considered an integral part of a complete examination. Limited examinations for reoccurring indications may be performed as noted. The reflux portion of the exam is performed with the patient in reverse Trendelenburg.  +---------+---------------+---------+-----------+----------+--------------+ RIGHT    CompressibilityPhasicitySpontaneityPropertiesThrombus Aging +---------+---------------+---------+-----------+----------+--------------+ CFV      Full           Yes      Yes                                 +---------+---------------+---------+-----------+----------+--------------+ SFJ      Full                                                        +---------+---------------+---------+-----------+----------+--------------+ FV Prox  Full                                                         +---------+---------------+---------+-----------+----------+--------------+ FV Mid   Full                                                        +---------+---------------+---------+-----------+----------+--------------+ FV DistalFull                                                        +---------+---------------+---------+-----------+----------+--------------+ PFV      Full                                                        +---------+---------------+---------+-----------+----------+--------------+ POP      Full           Yes      Yes                                 +---------+---------------+---------+-----------+----------+--------------+ PTV      Full                                                        +---------+---------------+---------+-----------+----------+--------------+ PERO     Full                                                        +---------+---------------+---------+-----------+----------+--------------+   +---------+---------------+---------+-----------+----------+--------------+  LEFT     CompressibilityPhasicitySpontaneityPropertiesThrombus Aging +---------+---------------+---------+-----------+----------+--------------+ CFV      Full           Yes      Yes                                 +---------+---------------+---------+-----------+----------+--------------+ SFJ      Full                                                        +---------+---------------+---------+-----------+----------+--------------+ FV Prox  Full                                                        +---------+---------------+---------+-----------+----------+--------------+ FV Mid   Full                                                        +---------+---------------+---------+-----------+----------+--------------+ FV DistalFull                                                         +---------+---------------+---------+-----------+----------+--------------+ PFV      Full                                                        +---------+---------------+---------+-----------+----------+--------------+ POP      Full           Yes      Yes                                 +---------+---------------+---------+-----------+----------+--------------+ PTV      Full                                                        +---------+---------------+---------+-----------+----------+--------------+ PERO     Full                                                        +---------+---------------+---------+-----------+----------+--------------+     Summary: BILATERAL: - No evidence of deep vein thrombosis seen in the lower extremities, bilaterally. - No evidence of superficial venous thrombosis in the lower extremities, bilaterally. -   *See table(s) above for measurements and observations. Electronically signed by Servando Snare MD  on 06/14/2020 at 12:16:46 PM.    Final     PHYSICAL EXAM  Temp:  [98.2 F (36.8 C)-99 F (37.2 C)] 99 F (37.2 C) (10/05 1707) Pulse Rate:  [60-92] 77 (10/05 1707) Resp:  [7-26] 17 (10/05 1707) BP: (103-215)/(65-112) 130/78 (10/05 1707) SpO2:  [98 %-100 %] 100 % (10/05 1707)  General - Well nourished, well developed, in no apparent distress.  Ophthalmologic - fundi not visualized due to noncooperation.  Cardiovascular - Regular rhythm and rate.  Mental Status -  Level of arousal and orientation to time, place, and person were intact. Language including expression, naming, repetition, comprehension was assessed and found intact. Fund of Knowledge was assessed and was intact.  Cranial Nerves II - XII - II - Visual field intact OU. III, IV, VI - Extraocular movements intact. V - Facial sensation intact bilaterally. VII - left facial droop. VIII - Hearing & vestibular intact bilaterally. X - Palate elevates symmetrically. XI - Chin  turning & shoulder shrug intact bilaterally. XII - Tongue protrusion to the left.  Motor Strength - The patient's strength was at least 4/5 in right upper and 3/5 lower extremities, however left upper extremity 1/5 proximal and 0/5 distal, right upper extremity no spontaneous movement, mild withdraw to pain stimulation.  Bulk was normal and fasciculations were absent.   Motor Tone - Muscle tone was assessed at the neck and appendages and was normal.  Reflexes - The patient's reflexes were symmetrical in all extremities and she had no pathological reflexes.  Sensory - Light touch, temperature/pinprick were assessed and were symmetrical.    Coordination - The patient had normal movements in the right hand with no ataxia or dysmetria.  Tremor was absent.  Gait and Station - deferred.   ASSESSMENT/PLAN [name] is a 58 y.o. female with history of hypertension, stroke admitted for worsening left facial droop and slurred speech and left-sided weakness. No tPA given due to also window and recent stroke.    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 Large vessel stenosis/athero in the setting of BP fluctuation - MRA and CTA showed right ACA and bilateral PCA stenosis.  Patient current strokes correlating to stenosed vessel territories, concerning for BP related phenomena 2. cardioembolic - TCD bubble neg, TEE pending, ?? 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 Autoimmune labs pending, ?? LP 4. Hypercoagulable state - will check hypercoagulable labs, may consider pan-CT to rule out malignancy   CT head showed right ACA subacute infarct, larger than stroke in 04/2020  MRI Interval expansion and enlargement of previously identified right ACA  territory infarct, now moderate in size and acute to subacute in appearance. Few additional scattered acute to subacute ischemic nonhemorrhagic infarcts involving the subcortical posterior left frontal region, right periatrial white matter, and splenium of the corpus callosum.  MRA Scattered atheromatous change about the carotid siphons, right ACA, and PCAs   2D Echo EF 60 to 65%  TEE pending  LE venous Doppler negative  LDL 47  Autoimmune and hypercoagulable work up pending  Lovenox for VTE prophylaxis - Lovenox was on hold in case of need LP  aspirin 325 mg daily prior to admission, now on aspirin 81 mg daily given possibility of needing LP  Patient counseled to be compliant with her antithrombotic  medications  Ongoing aggressive stroke risk factor management  Therapy recommendations: SNF  Disposition: Pending  Subacute left cerebellum hemorrhage with CMBs  MRI showed 1.4 x 2.3 x 1.7 cm well-circumscribed lobulated T1/FLAIR hyperintense lesion involving the left cerebellum, favored to reflect an evolving subacute hematoma  MRI also showed multiple chronic microhemorrhages, predominantly clustered about the thalami, favored to be related to chronic poorly controlled hypertension given distribution.  Patient BP high on admission, currently lowish after BP meds.  BP goal < 180 for 24-48h, and then gradually normalize in 3 to 5 days.  History of stroke  Admitted in 04/2020 for left-sided weakness and slurred speech.  MRI showed right ACA infarct.  CTA head and neck showed right A2 stenosis, bilateral P2 stenosis, bilateral siphon 50% stenosis, right more than left.  EF 60 to 65%.  TCD bubble study negative.  LDL 82, A1c 5.9.  Put on DAPT for 3 weeks as well as Lipitor 40.  Elevated ESR and CRP  ESR 75->67  CRP 47->4.0  ANA and RF neg  Autoimmune labs pending  Hypertensive urgency . Unstable - fluctuating . Permissive hypertension (OK if <180/105) for 24-48 hours post  stroke and then gradually normalized within 3-5 days.  Long term BP goal normotensive  Hyperlipidemia  Home meds:  Lipitor 40   LDL 47, goal < 70  Now on Lipitor 40  Continue statin at discharge  Other Stroke Risk Factors    Other Active Problems  OA  Hospital day # 1  I discussed with Dr. Tawanna Solo. I spent  35 minutes in total face-to-face time with the patient, more than 50% of which was spent in counseling and coordination of care, reviewing test results, images and medication, and discussing the diagnosis, treatment plan and potential prognosis. This patient's care requiresreview of multiple databases, neurological assessment, discussion with family, other specialists and medical decision making of high complexity.  Rosalin Hawking, MD PhD Stroke Neurology 06/14/2020 7:12 PM    To contact Stroke Continuity provider, please refer to [email or web address] After hours, contact General Neurology

## 2020-06-14 NOTE — H&P (View-Only) (Signed)
STROKE TEAM PROGRESS NOTE   SUBJECTIVE (INTERVAL HISTORY) No family is at the bedside.  Overall her condition is stable.  Patient lying in bed, awake alert, eyes open.  Still has left sided weakness.  On presentation, patient has high BP, however, on my encounter, patient blood pressure was on the lowish end.  Started on IV fluid and adjusted BP meds.  TEE planned for tomorrow.  Patient passed a swallow, will put on diet.  N.p.o. after midnight.  OBJECTIVE Temp:  [98.2 F (36.8 C)-99 F (37.2 C)] 99 F (37.2 C) (10/05 1707) Pulse Rate:  [60-92] 77 (10/05 1707) Cardiac Rhythm: Normal sinus rhythm (10/05 1713) Resp:  [7-26] 17 (10/05 1707) BP: (103-215)/(65-112) 130/78 (10/05 1707) SpO2:  [98 %-100 %] 100 % (10/05 1707)  Recent Labs  Lab 06/13/20 2115  GLUCAP 88   Recent Labs  Lab 06/13/20 2115 06/13/20 2126  NA 138 139  K 3.8 3.5  CL 104 105  CO2 20*  --   GLUCOSE 85 90  BUN 18 19  CREATININE 1.34* 1.30*  CALCIUM 10.3  --    Recent Labs  Lab 06/13/20 2115  AST 20  ALT 26  ALKPHOS 83  BILITOT 0.6  PROT 7.8  ALBUMIN 3.4*   Recent Labs  Lab 06/13/20 2115 06/13/20 2126  WBC 8.7  --   NEUTROABS 5.7  --   HGB 12.2 12.9  HCT 38.0 38.0  MCV 89.8  --   PLT 179  --    No results for input(s): CKTOTAL, CKMB, CKMBINDEX, TROPONINI in the last 168 hours. Recent Labs    06/13/20 2115  LABPROT 12.5  INR 1.0   No results for input(s): COLORURINE, LABSPEC, PHURINE, GLUCOSEU, HGBUR, BILIRUBINUR, KETONESUR, PROTEINUR, UROBILINOGEN, NITRITE, LEUKOCYTESUR in the last 72 hours.  Invalid input(s): APPERANCEUR     Component Value Date/Time   CHOL 132 06/13/2020 2115   TRIG 61 06/13/2020 2115   HDL 73 06/13/2020 2115   CHOLHDL 1.8 06/13/2020 2115   VLDL 12 06/13/2020 2115   LDLCALC 47 06/13/2020 2115   Lab Results  Component Value Date   HGBA1C 5.9 (H) 04/28/2020      Component Value Date/Time   LABOPIA NONE DETECTED 04/27/2020 0914   COCAINSCRNUR NONE DETECTED  04/27/2020 0914   LABBENZ NONE DETECTED 04/27/2020 0914   AMPHETMU NONE DETECTED 04/27/2020 0914   THCU NONE DETECTED 04/27/2020 0914   LABBARB NONE DETECTED 04/27/2020 0914    No results for input(s): ETH in the last 168 hours.  I have personally reviewed the radiological images below and agree with the radiology interpretations.  DG Chest 1 View  Result Date: 06/14/2020 CLINICAL DATA:  Vomiting.  Altered mental status. EXAM: CHEST  1 VIEW COMPARISON:  05/22/2020 FINDINGS: The heart is enlarged but stable from prior study. Both lungs are clear. The visualized skeletal structures are unremarkable. There are atherosclerotic changes of the thoracic aorta. IMPRESSION: No active disease. Electronically Signed   By: Constance Holster M.D.   On: 06/14/2020 00:47   DG Abd 1 View  Result Date: 06/14/2020 CLINICAL DATA:  Altered mental status with nausea and vomiting. EXAM: ABDOMEN - 1 VIEW COMPARISON:  05/23/2020 FINDINGS: The bowel gas pattern is normal. No radio-opaque calculi or other significant radiographic abnormality are seen. IMPRESSION: Negative. Electronically Signed   By: Constance Holster M.D.   On: 06/14/2020 00:48   DG Abd 1 View  Result Date: 05/23/2020 CLINICAL DATA:  Abdominal pain, mid to lower EXAM: ABDOMEN -  1 VIEW COMPARISON:  None. FINDINGS: No dilated small bowel loops. Mild colonic stool. No evidence of pneumatosis or pneumoperitoneum. Surgical clips are seen in the right upper quadrant of the abdomen. Clear lung bases. Lower lumbar spondylosis. IMPRESSION: Nonobstructive bowel gas pattern. Electronically Signed   By: Ilona Sorrel M.D.   On: 05/23/2020 11:22   MR ANGIO HEAD WO CONTRAST  Result Date: 06/13/2020 CLINICAL DATA:  Initial evaluation for neuro deficit, stroke suspected. History of recent right ACA territory infarct. EXAM: MRI HEAD WITHOUT AND WITH CONTRAST MRA HEAD WITHOUT CONTRAST TECHNIQUE: Multiplanar, multiecho pulse sequences of the brain and surrounding  structures were obtained without and with intravenous contrast. Angiographic images of the head were obtained using MRA technique without contrast. CONTRAST:  6.42m GADAVIST GADOBUTROL 1 MMOL/ML IV SOLN COMPARISON:  Comparison made with CT from earlier same day as well as previous CTA and MRI from 04/27/2020. FINDINGS: MRI HEAD FINDINGS Brain: Diffuse prominence of the CSF containing spaces compatible with generalized age-related cerebral atrophy. Patchy and confluent T2/FLAIR hyperintensity within the periventricular and deep white matter both cerebral hemispheres most consistent with chronic small vessel ischemic disease, advanced in nature. Patchy involvement of the deep gray nuclei, brainstem, and cerebellum noted as well. Few scattered remote lacunar infarcts present about the hemispheric cerebral white matter and thalami. 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 Associated enhancement seen following contrast administration. No associated hemorrhage or significant mass effect. Few additional scattered foci of restricted diffusion involving the subcortical posterior left frontal region, right periatrial white matter, and splenium of the corpus callosum also consistent with acute to subacute ischemic infarcts. No associated hemorrhage. These findings are new as compared to prior exam. 1.4 x 2.3 x 1.7 cm well-circumscribed lobulated T1/FLAIR hyperintense lesion seen involving the left cerebellum (series 4, image 7). Lesion demonstrates fairly avid root restricted diffusion. No significant associated enhancement or regional mass effect. Finding favored to reflect an evolving subacute hematoma, new as compared to prior MRI. Finding is difficult to see on prior CT performed earlier the same day. No other findings  to suggest acute or subacute intracranial hemorrhage. Multiple additional chronic microhemorrhages noted, predominantly clustered about the thalami, favored to be related to chronic underlying poorly controlled hypertension given distribution. This additional finding suggests that the left cerebellar hematoma may be hypertensive in nature as well. No mass lesion, mass effect, or midline shift. No hydrocephalus or extra-axial fluid collection. No other abnormal enhancement. Pituitary gland and suprasellar region within normal limits. Midline structures intact. Vascular: Major intracranial vascular flow voids are maintained. Skull and upper cervical spine: Craniocervical junction within normal limits. Bone marrow signal intensity normal. No scalp soft tissue abnormality. Sinuses/Orbits: Globes and orbital soft tissues within normal limits. Left maxillary sinus retention cyst. Paranasal sinuses are otherwise clear. No mastoid effusion. Other: None. MRA HEAD FINDINGS ANTERIOR CIRCULATION: Examination moderately degraded by motion artifact. Visualized distal cervical segments of the internal carotid arteries are patent with symmetric antegrade flow. Petrous segments patent bilaterally. Scattered atheromatous change noted throughout the carotid siphons, grossly similar to previous CTA. A1 segments patent bilaterally. Normal anterior communicating artery complex. Left ACA widely patent. Right A2 segment hypoplastic and diminutive, but remains grossly patent. M1 segments patent bilaterally. Normal MCA bifurcations. Distal MCA branches well perfused and symmetric. POSTERIOR CIRCULATION: Vertebral arteries patent to the vertebrobasilar junction without stenosis.  Left PICA patent at its origin. Right PICA not seen. Basilar grossly patent to its distal aspect without appreciable stenosis. Superior cerebral arteries patent bilaterally. Both PCAs primarily supplied via the basilar. Scattered atheromatous change about the PCAs  bilaterally without occlusion, grossly similar to previous. No appreciable aneurysm. IMPRESSION: MRI HEAD IMPRESSION: 1. 1.4 x 2.3 x 1.7 cm well-circumscribed lobulated T1/FLAIR hyperintense lesion involving the left cerebellum, favored to reflect an evolving subacute hematoma, new as compared to prior MRI. No significant regional mass effect. Finding suspected to be hypertensive in nature. 2. Interval expansion and enlargement of previously identified right ACA territory infarct, now moderate in size and acute to subacute in appearance. No significant mass effect. 3. Few additional scattered acute to subacute ischemic nonhemorrhagic infarcts involving the subcortical posterior left frontal region, right periatrial white matter, and splenium of the corpus callosum. 4. Underlying age-related cerebral atrophy with advanced chronic microvascular ischemic disease. 5. Multiple chronic microhemorrhages, predominantly clustered about the thalami, favored to be related to chronic poorly controlled hypertension given distribution. 6. Underlying atrophy with advanced chronic small vessel ischemic disease. MRA HEAD IMPRESSION: 1. Motion degraded exam. 2. Grossly stable appearance of the intracranial circulation. No large vessel occlusion. Scattered atheromatous change about the carotid siphons, right ACA, and PCAs. No definite new or progressive finding. Electronically Signed   By: Jeannine Boga M.D.   On: 06/13/2020 23:29   MR BRAIN W WO CONTRAST  Result Date: 06/13/2020 CLINICAL DATA:  Initial evaluation for neuro deficit, stroke suspected. History of recent right ACA territory infarct. EXAM: MRI HEAD WITHOUT AND WITH CONTRAST MRA HEAD WITHOUT CONTRAST TECHNIQUE: Multiplanar, multiecho pulse sequences of the brain and surrounding structures were obtained without and with intravenous contrast. Angiographic images of the head were obtained using MRA technique without contrast. CONTRAST:  6.8m GADAVIST GADOBUTROL 1  MMOL/ML IV SOLN COMPARISON:  Comparison made with CT from earlier same day as well as previous CTA and MRI from 04/27/2020. FINDINGS: MRI HEAD FINDINGS Brain: Diffuse prominence of the CSF containing spaces compatible with generalized age-related cerebral atrophy. Patchy and confluent T2/FLAIR hyperintensity within the periventricular and deep white matter both cerebral hemispheres most consistent with chronic small vessel ischemic disease, advanced in nature. Patchy involvement of the deep gray nuclei, brainstem, and cerebellum noted as well. Few scattered remote lacunar infarcts present about the hemispheric cerebral white matter and thalami. 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 Associated enhancement seen following contrast administration. No associated hemorrhage or significant mass effect. Few additional scattered foci of restricted diffusion involving the subcortical posterior left frontal region, right periatrial white matter, and splenium of the corpus callosum also consistent with acute to subacute ischemic infarcts. No associated hemorrhage. These findings are new as compared to prior exam. 1.4 x 2.3 x 1.7 cm well-circumscribed lobulated T1/FLAIR hyperintense lesion seen involving the left cerebellum (series 4, image 7). Lesion demonstrates fairly avid root restricted diffusion. No significant associated enhancement or regional mass effect. Finding favored to reflect an evolving subacute hematoma, new as compared to prior MRI. Finding is difficult to see on prior CT performed earlier the same day. No other findings to suggest acute or subacute intracranial hemorrhage. Multiple additional chronic microhemorrhages noted, predominantly clustered about the thalami, favored to be related to chronic  underlying poorly controlled hypertension given distribution. This additional finding suggests that the left  cerebellar hematoma may be hypertensive in nature as well. No mass lesion, mass effect, or midline shift. No hydrocephalus or extra-axial fluid collection. No other abnormal enhancement. Pituitary gland and suprasellar region within normal limits. Midline structures intact. Vascular: Major intracranial vascular flow voids are maintained. Skull and upper cervical spine: Craniocervical junction within normal limits. Bone marrow signal intensity normal. No scalp soft tissue abnormality. Sinuses/Orbits: Globes and orbital soft tissues within normal limits. Left maxillary sinus retention cyst. Paranasal sinuses are otherwise clear. No mastoid effusion. Other: None. MRA HEAD FINDINGS ANTERIOR CIRCULATION: Examination moderately degraded by motion artifact. Visualized distal cervical segments of the internal carotid arteries are patent with symmetric antegrade flow. Petrous segments patent bilaterally. Scattered atheromatous change noted throughout the carotid siphons, grossly similar to previous CTA. A1 segments patent bilaterally. Normal anterior communicating artery complex. Left ACA widely patent. Right A2 segment hypoplastic and diminutive, but remains grossly patent. M1 segments patent bilaterally. Normal MCA bifurcations. Distal MCA branches well perfused and symmetric. POSTERIOR CIRCULATION: Vertebral arteries patent to the vertebrobasilar junction without stenosis. Left PICA patent at its origin. Right PICA not seen. Basilar grossly patent to its distal aspect without appreciable stenosis. Superior cerebral arteries patent bilaterally. Both PCAs primarily supplied via the basilar. Scattered atheromatous change about the PCAs bilaterally without occlusion, grossly similar to previous. No appreciable aneurysm. IMPRESSION: MRI HEAD IMPRESSION: 1. 1.4 x 2.3 x 1.7 cm well-circumscribed lobulated T1/FLAIR  hyperintense lesion involving the left cerebellum, favored to reflect an evolving subacute hematoma, new as compared to prior MRI. No significant regional mass effect. Finding suspected to be hypertensive in nature. 2. Interval expansion and enlargement of previously identified right ACA territory infarct, now moderate in size and acute to subacute in appearance. No significant mass effect. 3. Few additional scattered acute to subacute ischemic nonhemorrhagic infarcts involving the subcortical posterior left frontal region, right periatrial white matter, and splenium of the corpus callosum. 4. Underlying age-related cerebral atrophy with advanced chronic microvascular ischemic disease. 5. Multiple chronic microhemorrhages, predominantly clustered about the thalami, favored to be related to chronic poorly controlled hypertension given distribution. 6. Underlying atrophy with advanced chronic small vessel ischemic disease. MRA HEAD IMPRESSION: 1. Motion degraded exam. 2. Grossly stable appearance of the intracranial circulation. No large vessel occlusion. Scattered atheromatous change about the carotid siphons, right ACA, and PCAs. No definite new or progressive finding. Electronically Signed   By: Jeannine Boga M.D.   On: 06/13/2020 23:29   MR PELVIS WO CONTRAST  Result Date: 05/28/2020 CLINICAL DATA:  Pelvic pain. EXAM: MRI PELVIS WITHOUT CONTRAST TECHNIQUE: Multiplanar multisequence MR imaging of the pelvis was performed. No intravenous contrast was administered. COMPARISON:  None. FINDINGS: Bones: No hip fracture, dislocation or avascular necrosis. No periosteal reaction or bone destruction. No aggressive osseous lesion. Normal sacrum and sacroiliac joints. No SI joint widening or erosive changes. Degenerative disc disease with disc height loss at L5-S1. minimal grade 1 anterolisthesis of L4 on L5 secondary to moderate bilateral facet arthropathy. Bilateral foraminal narrowing at L4-5 with mild spinal  stenosis. Articular cartilage and labrum Articular cartilage:  No chondral defect. Labrum: Grossly intact, but evaluation is limited by lack of intraarticular fluid. Joint or bursal effusion Joint effusion:  No hip joint effusion.  No SI joint effusion. Bursae:  No bursa formation. Muscles and tendons Flexors: Normal. Extensors: Normal. Abductors: Normal. Adductors: Mild muscle edema in the left adductor musculature likely reflecting muscle strain versus myositis. Gluteals: Mild muscle edema in the gluteus minimus and medius muscle bilaterally  which may reflect mild myositis or muscle strain, left greater than right. Hamstrings: Normal. Other findings No pelvic free fluid. No fluid collection or hematoma. No inguinal lymphadenopathy. No inguinal hernia. Soft tissue edema in the subcutaneous fat superficial to the gluteus maximus muscle bilaterally likely reflecting contusions or fat necrosis. IMPRESSION: 1. No hip fracture, dislocation or avascular necrosis. 2. Mild muscle edema in the left adductor musculature likely reflecting muscle strain versus myositis. 3. Mild muscle edema in the gluteus minimus and medius muscle bilaterally which may reflect mild myositis or muscle strain, left greater than right. 4. Soft tissue edema in the subcutaneous fat superficial to the gluteus maximus muscle bilaterally likely reflecting contusions or fat necrosis. Electronically Signed   By: Kathreen Devoid   On: 05/28/2020 09:58   DG CHEST PORT 1 VIEW  Result Date: 05/22/2020 CLINICAL DATA:  Persistent cough EXAM: PORTABLE CHEST 1 VIEW COMPARISON:  09/19/2015 FINDINGS: No focal opacity or pleural effusion. Mild cardiomegaly. Aortic atherosclerosis. No pneumothorax. Skin fold artifact over the right lower chest. IMPRESSION: No active disease.  Mild cardiomegaly. Electronically Signed   By: Donavan Foil M.D.   On: 05/22/2020 19:47   ECHOCARDIOGRAM COMPLETE  Result Date: 06/14/2020    ECHOCARDIOGRAM REPORT   Patient Name:    Jacqualine Mau Lerew Date of Exam: 06/14/2020 Medical Rec #:  [id number]           Height:       63.0 in Accession #:    [id number]          Weight:       145.0 lb Date of Birth:  October 28, 1960           BSA:          1.687 m Patient Age:    62 years            BP:           190/105 mmHg Patient Gender: F                   HR:           75 bpm. Exam Location:  Inpatient Procedure: 2D Echo, Cardiac Doppler and Color Doppler Indications:    Stroke 434.91 / I163.9  History:        Patient has prior history of Echocardiogram examinations, most                 recent 04/27/2020. Stroke; Risk Factors:Hypertension.  Sonographer:    Bernadene Person RDCS Referring Phys: [id number] Cleaster Corin PATEL  Sonographer Comments: patient on right side IMPRESSIONS  1. Intracavitary gradient, peak velocity 2.38 m/s. Peak gradient 22.7 mmHg.Marland Kitchen Left ventricular ejection fraction, by estimation, is 60 to 65%. The left ventricle has normal function. The left ventricle has no regional wall motion abnormalities. There is mild concentric left ventricular hypertrophy. Left ventricular diastolic parameters are consistent with Grade I diastolic dysfunction (impaired relaxation). Elevated left ventricular end-diastolic pressure.  2. Right ventricular systolic function is normal. The right ventricular size is normal. There is normal pulmonary artery systolic pressure.  3. Left atrial size was mildly dilated.  4. The mitral valve is normal in structure. No evidence of mitral valve regurgitation. No evidence of mitral stenosis.  5. The aortic valve is normal in structure. Aortic valve regurgitation is not visualized. No aortic stenosis is present.  6. The inferior vena cava is normal in size with greater than 50% respiratory variability, suggesting right atrial pressure of 3 mmHg.  FINDINGS  Left Ventricle: Intracavitary gradient, peak velocity 2.38 m/s. Peak gradient 22.7 mmHg. Left ventricular ejection fraction, by estimation, is 60 to 65%. The left ventricle has  normal function. The left ventricle has no regional wall motion abnormalities. The left ventricular internal cavity size was normal in size. There is mild concentric left ventricular hypertrophy. Left ventricular diastolic parameters are consistent with Grade I diastolic dysfunction (impaired relaxation). Elevated left ventricular end-diastolic pressure. Right Ventricle: The right ventricular size is normal. No increase in right ventricular wall thickness. Right ventricular systolic function is normal. There is normal pulmonary artery systolic pressure. The tricuspid regurgitant velocity is 1.24 m/s, and  with an assumed right atrial pressure of 3 mmHg, the estimated right ventricular systolic pressure is 9.2 mmHg. Left Atrium: Left atrial size was mildly dilated. Right Atrium: Right atrial size was normal in size. Pericardium: There is no evidence of pericardial effusion. Mitral Valve: The mitral valve is normal in structure. No evidence of mitral valve regurgitation. No evidence of mitral valve stenosis. Tricuspid Valve: The tricuspid valve is normal in structure. Tricuspid valve regurgitation is trivial. No evidence of tricuspid stenosis. Aortic Valve: The aortic valve is normal in structure. Aortic valve regurgitation is not visualized. No aortic stenosis is present. Pulmonic Valve: The pulmonic valve was normal in structure. Pulmonic valve regurgitation is not visualized. No evidence of pulmonic stenosis. Aorta: The aortic root is normal in size and structure. Venous: The inferior vena cava is normal in size with greater than 50% respiratory variability, suggesting right atrial pressure of 3 mmHg. IAS/Shunts: No atrial level shunt detected by color flow Doppler.  LEFT VENTRICLE PLAX 2D LVIDd:         4.88 cm  Diastology LVIDs:         3.47 cm  LV e' medial:    3.65 cm/s LV PW:         1.23 cm  LV E/e' medial:  15.3 LV IVS:        1.07 cm  LV e' lateral:   3.97 cm/s LVOT diam:     1.90 cm  LV E/e' lateral: 14.1  LV SV:         73 LV SV Index:   44 LVOT Area:     2.84 cm  RIGHT VENTRICLE RV S prime:     15.80 cm/s TAPSE (M-mode): 1.7 cm LEFT ATRIUM             Index       RIGHT ATRIUM           Index LA diam:        3.50 cm 2.08 cm/m  RA Area:     13.20 cm LA Vol (A2C):   59.9 ml 35.51 ml/m RA Volume:   29.70 ml  17.61 ml/m LA Vol (A4C):   40.1 ml 23.78 ml/m LA Biplane Vol: 50.2 ml 29.76 ml/m  AORTIC VALVE LVOT Vmax:   155.00 cm/s LVOT Vmean:  121.000 cm/s LVOT VTI:    0.259 m  AORTA Ao Root diam: 2.70 cm Ao Asc diam:  2.60 cm MITRAL VALVE               TRICUSPID VALVE MV Area (PHT): 2.50 cm    TR Peak grad:   6.2 mmHg MV Decel Time: 303 msec    TR Vmax:        124.00 cm/s MV E velocity: 56.00 cm/s MV A velocity: 74.40 cm/s  SHUNTS MV E/A ratio:  0.75  Systemic VTI:  0.26 m                            Systemic Diam: 1.90 cm Skeet Latch MD Electronically signed by Skeet Latch MD Signature Date/Time: 06/14/2020/1:15:36 PM    Final    DG HIP UNILAT WITH PELVIS 2-3 VIEWS LEFT  Result Date: 05/18/2020 CLINICAL DATA:  Left groin pain. EXAM: DG HIP (WITH OR WITHOUT PELVIS) 2-3V LEFT COMPARISON:  None. FINDINGS: There is no evidence of hip fracture or dislocation. There is no evidence of arthropathy or other focal bone abnormality. IMPRESSION: Negative. Electronically Signed   By: Kathreen Devoid   On: 05/18/2020 10:39   CT HEAD CODE STROKE WO CONTRAST  Result Date: 06/13/2020 CLINICAL DATA:  Code stroke. Initial evaluation for acute left facial droop, slurred speech. Patient has a history of a recent right ACA territory infarct. EXAM: CT HEAD WITHOUT CONTRAST TECHNIQUE: Contiguous axial images were obtained from the base of the skull through the vertex without intravenous contrast. COMPARISON:  Prior CT from 09/19/2015. Additionally, there is a previous brain MRI from 04/27/2020, reviewed in epic, but not available for review in PACS. FINDINGS: Brain: Generalized age-related cerebral atrophy. Patchy and  confluent hypodensity involving the periventricular deep white matter of both cerebral hemispheres most consistent with chronic small vessel ischemic disease. Patchy involvement of the deep gray nuclei, brainstem, and cerebellum noted as well. Appearance is fairly advanced in nature. Few scattered remote lacunar infarcts noted about the bilateral thalami. There is cortical and subcortical hypodensity involving the parasagittal right frontoparietal region, consistent with right ACA territory ischemia. Finding corresponds with previously identified right ACA territory infarct. However, appearance is somewhat more prominent on current exam as compared to previous brain MRI, raising the possibility for possible interval expansion and/or new acute component. Punctate hyperdensity at the posterior aspect of this area of ischemia suspicious for a small focus of associated petechial hemorrhage (series 3, image 23). No frank hemorrhagic transformation or mass effect. No other evidence for acute large vessel territory infarct. Gray-white matter differentiation otherwise grossly maintained. No mass lesion or midline shift. No hydrocephalus or extra-axial fluid collection. Vascular: No hyperdense vessel. Scattered vascular calcifications noted within the carotid siphons. Skull: Scalp soft tissues and calvarium within normal limits. Sinuses/Orbits: Globes and orbital soft tissues demonstrate no acute finding. Left maxillary sinus retention cyst noted. Paranasal sinuses are otherwise largely clear. No mastoid effusion. Other: Poor dentition noted. ASPECTS [facility] Stroke Program Early CT Score) - Ganglionic level infarction (caudate, lentiform nuclei, internal capsule, insula, M1-M3 cortex): 7 - Supraganglionic infarction (M4-M6 cortex): 3 Total score (0-10 with 10 being normal): 10 IMPRESSION: 1. Cortical and subcortical hypodensity involving the parasagittal right frontoparietal region, consistent with previously identified  right ACA territory infarct. Appearance is somewhat more prominent on current exam as compared to previous brain MRI, raising the possibility for possible interval expansion and/or new acute component. Punctate hyperdensity at the posterior aspect of this area of ischemia suspicious for a small focus of associated petechial hemorrhage. No frank hemorrhagic transformation or mass effect. 2. ASPECTS is 10. 3. 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 These results were communicated to Dr. Rory Percy at 9:35 pmon 10/4/2021by text page via the [facility] messaging system. Findings also discussed by telephone with Dr. Rory Percy at time of dictation. Electronically Signed   By: Jeannine Boga M.D.   On: 06/13/2020 21:49   VAS [place] LOWER EXTREMITY VENOUS (  DVT)  Result Date: 06/14/2020  Lower Venous DVTStudy Indications: Stroke.  Comparison Study: no prior Performing Technologist: Abram Sander RVS  Examination Guidelines: A complete evaluation includes B-mode imaging, spectral Doppler, color Doppler, and power Doppler as needed of all accessible portions of each vessel. Bilateral testing is considered an integral part of a complete examination. Limited examinations for reoccurring indications may be performed as noted. The reflux portion of the exam is performed with the patient in reverse Trendelenburg.  +---------+---------------+---------+-----------+----------+--------------+ RIGHT    CompressibilityPhasicitySpontaneityPropertiesThrombus Aging +---------+---------------+---------+-----------+----------+--------------+ CFV      Full           Yes      Yes                                 +---------+---------------+---------+-----------+----------+--------------+ SFJ      Full                                                        +---------+---------------+---------+-----------+----------+--------------+ FV Prox  Full                                                         +---------+---------------+---------+-----------+----------+--------------+ FV Mid   Full                                                        +---------+---------------+---------+-----------+----------+--------------+ FV DistalFull                                                        +---------+---------------+---------+-----------+----------+--------------+ PFV      Full                                                        +---------+---------------+---------+-----------+----------+--------------+ POP      Full           Yes      Yes                                 +---------+---------------+---------+-----------+----------+--------------+ PTV      Full                                                        +---------+---------------+---------+-----------+----------+--------------+ PERO     Full                                                        +---------+---------------+---------+-----------+----------+--------------+   +---------+---------------+---------+-----------+----------+--------------+  LEFT     CompressibilityPhasicitySpontaneityPropertiesThrombus Aging +---------+---------------+---------+-----------+----------+--------------+ CFV      Full           Yes      Yes                                 +---------+---------------+---------+-----------+----------+--------------+ SFJ      Full                                                        +---------+---------------+---------+-----------+----------+--------------+ FV Prox  Full                                                        +---------+---------------+---------+-----------+----------+--------------+ FV Mid   Full                                                        +---------+---------------+---------+-----------+----------+--------------+ FV DistalFull                                                         +---------+---------------+---------+-----------+----------+--------------+ PFV      Full                                                        +---------+---------------+---------+-----------+----------+--------------+ POP      Full           Yes      Yes                                 +---------+---------------+---------+-----------+----------+--------------+ PTV      Full                                                        +---------+---------------+---------+-----------+----------+--------------+ PERO     Full                                                        +---------+---------------+---------+-----------+----------+--------------+     Summary: BILATERAL: - No evidence of deep vein thrombosis seen in the lower extremities, bilaterally. - No evidence of superficial venous thrombosis in the lower extremities, bilaterally. -   *See table(s) above for measurements and observations. Electronically signed by Servando Snare MD  on 06/14/2020 at 12:16:46 PM.    Final     PHYSICAL EXAM  Temp:  [98.2 F (36.8 C)-99 F (37.2 C)] 99 F (37.2 C) (10/05 1707) Pulse Rate:  [60-92] 77 (10/05 1707) Resp:  [7-26] 17 (10/05 1707) BP: (103-215)/(65-112) 130/78 (10/05 1707) SpO2:  [98 %-100 %] 100 % (10/05 1707)  General - Well nourished, well developed, in no apparent distress.  Ophthalmologic - fundi not visualized due to noncooperation.  Cardiovascular - Regular rhythm and rate.  Mental Status -  Level of arousal and orientation to time, place, and person were intact. Language including expression, naming, repetition, comprehension was assessed and found intact. Fund of Knowledge was assessed and was intact.  Cranial Nerves II - XII - II - Visual field intact OU. III, IV, VI - Extraocular movements intact. V - Facial sensation intact bilaterally. VII - left facial droop. VIII - Hearing & vestibular intact bilaterally. X - Palate elevates symmetrically. XI - Chin  turning & shoulder shrug intact bilaterally. XII - Tongue protrusion to the left.  Motor Strength - The patient's strength was at least 4/5 in right upper and 3/5 lower extremities, however left upper extremity 1/5 proximal and 0/5 distal, right upper extremity no spontaneous movement, mild withdraw to pain stimulation.  Bulk was normal and fasciculations were absent.   Motor Tone - Muscle tone was assessed at the neck and appendages and was normal.  Reflexes - The patient's reflexes were symmetrical in all extremities and she had no pathological reflexes.  Sensory - Light touch, temperature/pinprick were assessed and were symmetrical.    Coordination - The patient had normal movements in the right hand with no ataxia or dysmetria.  Tremor was absent.  Gait and Station - deferred.   ASSESSMENT/PLAN Ms. DOCIA KLAR is a 58 y.o. female with history of hypertension, stroke admitted for worsening left facial droop and slurred speech and left-sided weakness. No tPA given due to also window and recent stroke.    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 Large vessel stenosis/athero in the setting of BP fluctuation - MRA and CTA showed right ACA and bilateral PCA stenosis.  Patient current strokes correlating to stenosed vessel territories, concerning for BP related phenomena 2. cardioembolic - TCD bubble neg, TEE pending, ?? 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 Autoimmune labs pending, ?? LP 4. Hypercoagulable state - will check hypercoagulable labs, may consider pan-CT to rule out malignancy   CT head showed right ACA subacute infarct, larger than stroke in 04/2020  MRI Interval expansion and enlargement of previously identified right ACA  territory infarct, now moderate in size and acute to subacute in appearance. Few additional scattered acute to subacute ischemic nonhemorrhagic infarcts involving the subcortical posterior left frontal region, right periatrial white matter, and splenium of the corpus callosum.  MRA Scattered atheromatous change about the carotid siphons, right ACA, and PCAs   2D Echo EF 60 to 65%  TEE pending  LE venous Doppler negative  LDL 47  Autoimmune and hypercoagulable work up pending  Lovenox for VTE prophylaxis - Lovenox was on hold in case of need LP  aspirin 325 mg daily prior to admission, now on aspirin 81 mg daily given possibility of needing LP  Patient counseled to be compliant with her antithrombotic  medications  Ongoing aggressive stroke risk factor management  Therapy recommendations: SNF  Disposition: Pending  Subacute left cerebellum hemorrhage with CMBs  MRI showed 1.4 x 2.3 x 1.7 cm well-circumscribed lobulated T1/FLAIR hyperintense lesion involving the left cerebellum, favored to reflect an evolving subacute hematoma  MRI also showed multiple chronic microhemorrhages, predominantly clustered about the thalami, favored to be related to chronic poorly controlled hypertension given distribution.  Patient BP high on admission, currently lowish after BP meds.  BP goal < 180 for 24-48h, and then gradually normalize in 3 to 5 days.  History of stroke  Admitted in 04/2020 for left-sided weakness and slurred speech.  MRI showed right ACA infarct.  CTA head and neck showed right A2 stenosis, bilateral P2 stenosis, bilateral siphon 50% stenosis, right more than left.  EF 60 to 65%.  TCD bubble study negative.  LDL 82, A1c 5.9.  Put on DAPT for 3 weeks as well as Lipitor 40.  Elevated ESR and CRP  ESR 75->67  CRP 47->4.0  ANA and RF neg  Autoimmune labs pending  Hypertensive urgency . Unstable - fluctuating . Permissive hypertension (OK if <180/105) for 24-48 hours post  stroke and then gradually normalized within 3-5 days.  Long term BP goal normotensive  Hyperlipidemia  Home meds:  Lipitor 40   LDL 47, goal < 70  Now on Lipitor 40  Continue statin at discharge  Other Stroke Risk Factors    Other Active Problems  OA  Hospital day # 1  I discussed with Dr. Tawanna Solo. I spent  35 minutes in total face-to-face time with the patient, more than 50% of which was spent in counseling and coordination of care, reviewing test results, images and medication, and discussing the diagnosis, treatment plan and potential prognosis. This patient's care requiresreview of multiple databases, neurological assessment, discussion with family, other specialists and medical decision making of high complexity.  Rosalin Hawking, MD PhD Stroke Neurology 06/14/2020 7:12 PM    To contact Stroke Continuity provider, please refer to [email or web address] After hours, contact General Neurology

## 2020-06-14 NOTE — ED Notes (Addendum)
Dr. Tonie Griffith contacted regarding SBP trending up and remaining above 180 x 1 hour despite labetalol being given as ordered with last dose at 0200. He is to review patient and advise further.

## 2020-06-14 NOTE — Progress Notes (Addendum)
PROGRESS NOTE    [name]  GEZ:[id number] DOB: [birth year] DOA: 06/13/2020 PCP: Argentina Donovan, PA-C   Chief Complain: Left facial weakness, slurred speech worse from baseline  Brief Narrative:  Patient is a 59 year old female with history of uncontrolled hypertension, CKD stage IIIa, recent nonhemorrhagic stroke in 04/27/2020 involving the right ACA territory with residual left-sided hemiparesis and dysarthria felt to be embolic due to unknown source who presented to the emergency department from home for the evaluation of worsening left-sided facial weakness, slurred speech compared to recent baseline.  She was admitted in managed here from 04/27/2019 1-8/24-21 for acute right ACA branch infarct.  Currently she is on aspirin at home.  Neurology  has been consulted.  Stroke work-up initiated.  On presentation, she was hypertensive.  MRI/MRA showed evolving subacute hematoma involving the left cerebellum which is new compared to prior MRI.  There was also interval expansion and enlargement of previously identified right ACA territory infarct now moderate in size, there were also scattered acute to subacute ischemic nonhemorrhagic infarcts involving the subcortical posterior left frontal region, right parietal white matter, splenium of corpus callosum.  PT/OT consulted.  Plan is to consult cardiology for TEE.  Avoiding any antiplatelets or anticoagulants at this time.  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  Neurology consulted and following.  Holding anticoagulants and antiplatelets at this time. Cardiology consulted for TEE.  negative lower extremity Dopplers  for DVT.  PT/OT/SLP consulted.  2D echo showed ejection fraction of 60 to 65%, grade 1 diastolic dysfunction, no intracardiac source of emboli. She was taking aspirin at home.Continue lipitor.  Her LDL as per August this year was 82 and hemoglobin A1c was 5.9.  Hypertensive emergency: Severely hypertensive on presentation.  Blood pressure goal of 140/90.  Continue as needed meds for severe hypertension.  She is hypertensive and is more than 24 hours after presentation.  resumed home meds.  Nausea/vomiting/constipation: Probably nausea and vomiting related with intracranial hematoma.  Currently much improved.  She was also constipated on presentation and she had a large bowel movement.  CKD stage IIIa: Currently kidney function at baseline.  Elevated inflammatory markers: Elevated CRP and ESR.  ANA/RF negative.  Vasculitis is a possibility.We will check ANCAs.         DVT prophylaxis:SCD Code Status: Full Family Communication: Called husband but phone not received. Status is: Inpatient  Remains inpatient appropriate because:Inpatient level of care appropriate due to severity of illness   Dispo: The patient is from: Home              Anticipated d/c is to: SNF              Anticipated d/c date is: > 3 days              Patient currently is not medically stable to d/c.    Consultants: Neurology  Procedures:None  Antimicrobials:  Anti-infectives (From admission, onward)   None      Subjective: Patient seen and examined at the bedside this morning.  She was hypertensive during my evaluation.  She had severe weakness on the left side.  Her speech is slightly improved but is still slurred.  Patient was alert  and oriented and denied any new complaints.  Objective: Vitals:   06/14/20 0800 06/14/20 0802 06/14/20 0815 06/14/20 0830  BP: (!) 191/103 (!) 190/105 (!) 201/89 (!) 188/87  Pulse: 79 77 71 62  Resp: 20 16 12 12   Temp:      TempSrc:      SpO2: 100% 100% 100% 100%    No intake or output data in the 24 hours ending 06/14/20 0850 There were no vitals filed for this visit.  Examination:  General exam: Debilitated, deconditioned, chronically looking HEENT:PERRL,Oral mucosa moist, Ear/Nose normal on gross exam Respiratory system: Bilateral equal air entry, normal vesicular breath sounds, no wheezes or crackles  Cardiovascular system: S1 & S2 heard, RRR. No JVD, murmurs, rubs, gallops or clicks. No pedal edema. Gastrointestinal system: Abdomen is nondistended, soft and nontender. No organomegaly or masses felt. Normal bowel sounds heard. Central nervous system: Alert and oriented.  Slurred speech.  Left hemiparesis  Motor: Power of 1/5 on both left upper and lower extremities.  Power of 4/5 on both right upper and lower extremities extremities: No edema, no clubbing ,no cyanosis Skin: No rashes, lesions or ulcers,no icterus ,no pallor   Data Reviewed: I have personally reviewed following labs and imaging studies  CBC: Recent Labs  Lab 06/13/20 2115 06/13/20 2126  WBC 8.7  --   NEUTROABS 5.7  --   HGB 12.2 12.9  HCT 38.0 38.0  MCV 89.8  --   PLT 179  --    Basic Metabolic Panel: Recent Labs  Lab 06/13/20 2115 06/13/20 2126  NA 138 139  K 3.8 3.5  CL 104 105  CO2 20*  --   GLUCOSE 85 90  BUN 18 19  CREATININE 1.34* 1.30*  CALCIUM 10.3  --    GFR: Estimated Creatinine Clearance: 43 mL/min (A) (by C-G formula based on SCr of 1.3 mg/dL (H)). Liver Function Tests: Recent Labs  Lab 06/13/20 2115  AST 20  ALT 26  ALKPHOS 83  BILITOT 0.6  PROT 7.8  ALBUMIN 3.4*   No results for input(s): LIPASE, AMYLASE in the last 168 hours. No results for input(s): AMMONIA in the last 168 hours. Coagulation Profile: Recent Labs  Lab 06/13/20 2115  INR 1.0   Cardiac Enzymes: No results for input(s): CKTOTAL, CKMB, CKMBINDEX, TROPONINI in the last 168 hours. BNP (last 3 results) No results for input(s): PROBNP in the last 8760  hours. HbA1C: No results for input(s): HGBA1C in the last 72 hours. CBG: Recent Labs  Lab 06/13/20 2115  GLUCAP 88   Lipid Profile: No results for input(s): CHOL, HDL, LDLCALC, TRIG, CHOLHDL, LDLDIRECT in the last 72 hours. Thyroid Function Tests: No results for input(s): TSH, T4TOTAL, FREET4, T3FREE, THYROIDAB in the last 72 hours. Anemia Panel: No results for input(s): VITAMINB12, FOLATE, FERRITIN, TIBC, IRON, RETICCTPCT in the last 72 hours. Sepsis Labs: No results for input(s): PROCALCITON, LATICACIDVEN in the last 168 hours.  Recent Results (from the past 240 hour(s))  Respiratory Panel by RT PCR (Flu A&B, Covid) - Nasopharyngeal Swab     Status: None   Collection Time: 06/14/20  1:38 AM   Specimen: Nasopharyngeal Swab  Result Value Ref Range Status   SARS Coronavirus 2 by RT PCR NEGATIVE NEGATIVE Final    Comment: (NOTE) SARS-CoV-2 target nucleic acids are NOT DETECTED.  The SARS-CoV-2 RNA is generally detectable in upper respiratoy specimens during the acute phase of infection. The lowest concentration of SARS-CoV-2 viral copies this assay can detect is  131 copies/mL. A negative result does not preclude SARS-Cov-2 infection and should not be used as the sole basis for treatment or other patient management decisions. A negative result may occur with  improper specimen collection/handling, submission of specimen other than nasopharyngeal swab, presence of viral mutation(s) within the areas targeted by this assay, and inadequate number of viral copies (<131 copies/mL). A negative result must be combined with clinical observations, patient history, and epidemiological information. The expected result is Negative.  Fact Sheet for Patients:  PinkCheek.be  Fact Sheet for Healthcare Providers:  GravelBags.it  This test is no t yet approved or cleared by the [place] FDA and  has been authorized for detection  and/or diagnosis of SARS-CoV-2 by FDA under an Emergency Use Authorization (EUA). This EUA will remain  in effect (meaning this test can be used) for the duration of the COVID-19 declaration under Section 564(b)(1) of the Act, 21 U.S.C. section 360bbb-3(b)(1), unless the authorization is terminated or revoked sooner.     Influenza A by PCR NEGATIVE NEGATIVE Final   Influenza B by PCR NEGATIVE NEGATIVE Final    Comment: (NOTE) The Xpert Xpress SARS-CoV-2/FLU/RSV assay is intended as an aid in  the diagnosis of influenza from Nasopharyngeal swab specimens and  should not be used as a sole basis for treatment. Nasal washings and  aspirates are unacceptable for Xpert Xpress SARS-CoV-2/FLU/RSV  testing.  Fact Sheet for Patients: PinkCheek.be  Fact Sheet for Healthcare Providers: GravelBags.it  This test is not yet approved or cleared by the [place] FDA and  has been authorized for detection and/or diagnosis of SARS-CoV-2 by  FDA under an Emergency Use Authorization (EUA). This EUA will remain  in effect (meaning this test can be used) for the duration of the  Covid-19 declaration under Section 564(b)(1) of the Act, 21  U.S.C. section 360bbb-3(b)(1), unless the authorization is  terminated or revoked. Performed at [facility], [place] [address], [place], [place] [address]          Radiology Studies: DG Chest 1 View  Result Date: 06/14/2020 CLINICAL DATA:  Vomiting.  Altered mental status. EXAM: CHEST  1 VIEW COMPARISON:  05/22/2020 FINDINGS: The heart is enlarged but stable from prior study. Both lungs are clear. The visualized skeletal structures are unremarkable. There are atherosclerotic changes of the thoracic aorta. IMPRESSION: No active disease. Electronically Signed   By: Constance Holster M.D.   On: 06/14/2020 00:47   DG Abd 1 View  Result Date: 06/14/2020 CLINICAL DATA:  Altered mental status with  nausea and vomiting. EXAM: ABDOMEN - 1 VIEW COMPARISON:  05/23/2020 FINDINGS: The bowel gas pattern is normal. No radio-opaque calculi or other significant radiographic abnormality are seen. IMPRESSION: Negative. Electronically Signed   By: Constance Holster M.D.   On: 06/14/2020 00:48   MR ANGIO HEAD WO CONTRAST  Result Date: 06/13/2020 CLINICAL DATA:  Initial evaluation for neuro deficit, stroke suspected. History of recent right ACA territory infarct. EXAM: MRI HEAD WITHOUT AND WITH CONTRAST MRA HEAD WITHOUT CONTRAST TECHNIQUE: Multiplanar, multiecho pulse sequences of the brain and surrounding structures were obtained without and with intravenous contrast. Angiographic images of the head were obtained using MRA technique without contrast. CONTRAST:  6.52m GADAVIST GADOBUTROL 1 MMOL/ML IV SOLN COMPARISON:  Comparison made with CT from earlier same day as well as previous CTA and MRI from 04/27/2020. FINDINGS: MRI HEAD FINDINGS Brain: Diffuse prominence of the CSF containing spaces compatible with generalized age-related cerebral atrophy. Patchy and confluent  T2/FLAIR hyperintensity within the periventricular and deep white matter both cerebral hemispheres most consistent with chronic small vessel ischemic disease, advanced in nature. Patchy involvement of the deep gray nuclei, brainstem, and cerebellum noted as well. Few scattered remote lacunar infarcts present about the hemispheric cerebral white matter and thalami. 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 Associated enhancement seen following contrast administration. No associated hemorrhage or significant mass effect. Few additional scattered foci of restricted diffusion involving the subcortical posterior left frontal region, right periatrial white  matter, and splenium of the corpus callosum also consistent with acute to subacute ischemic infarcts. No associated hemorrhage. These findings are new as compared to prior exam. 1.4 x 2.3 x 1.7 cm well-circumscribed lobulated T1/FLAIR hyperintense lesion seen involving the left cerebellum (series 4, image 7). Lesion demonstrates fairly avid root restricted diffusion. No significant associated enhancement or regional mass effect. Finding favored to reflect an evolving subacute hematoma, new as compared to prior MRI. Finding is difficult to see on prior CT performed earlier the same day. No other findings to suggest acute or subacute intracranial hemorrhage. Multiple additional chronic microhemorrhages noted, predominantly clustered about the thalami, favored to be related to chronic underlying poorly controlled hypertension given distribution. This additional finding suggests that the left cerebellar hematoma may be hypertensive in nature as well. No mass lesion, mass effect, or midline shift. No hydrocephalus or extra-axial fluid collection. No other abnormal enhancement. Pituitary gland and suprasellar region within normal limits. Midline structures intact. Vascular: Major intracranial vascular flow voids are maintained. Skull and upper cervical spine: Craniocervical junction within normal limits. Bone marrow signal intensity normal. No scalp soft tissue abnormality. Sinuses/Orbits: Globes and orbital soft tissues within normal limits. Left maxillary sinus retention cyst. Paranasal sinuses are otherwise clear. No mastoid effusion. Other: None. MRA HEAD FINDINGS ANTERIOR CIRCULATION: Examination moderately degraded by motion artifact. Visualized distal cervical segments of the internal carotid arteries are patent with symmetric antegrade flow. Petrous segments patent bilaterally. Scattered atheromatous change noted throughout the carotid siphons, grossly similar to previous CTA. A1 segments patent bilaterally.  Normal anterior communicating artery complex. Left ACA widely patent. Right A2 segment hypoplastic and diminutive, but remains grossly patent. M1 segments patent bilaterally. Normal MCA bifurcations. Distal MCA branches well perfused and symmetric. POSTERIOR CIRCULATION: Vertebral arteries patent to the vertebrobasilar junction without stenosis. Left PICA patent at its origin. Right PICA not seen. Basilar grossly patent to its distal aspect without appreciable stenosis. Superior cerebral arteries patent bilaterally. Both PCAs primarily supplied via the basilar. Scattered atheromatous change about the PCAs bilaterally without occlusion, grossly similar to previous. No appreciable aneurysm. IMPRESSION: MRI HEAD IMPRESSION: 1. 1.4 x 2.3 x 1.7 cm well-circumscribed lobulated T1/FLAIR hyperintense lesion involving the left cerebellum, favored to reflect an evolving subacute hematoma, new as compared to prior MRI. No significant regional mass effect. Finding suspected to be hypertensive in nature. 2. Interval expansion and enlargement of previously identified right ACA territory infarct, now moderate in size and acute to subacute in appearance. No significant mass effect. 3. Few additional scattered acute to subacute ischemic nonhemorrhagic infarcts involving the subcortical posterior left frontal region, right periatrial white matter, and splenium of the corpus callosum. 4. Underlying age-related cerebral atrophy with advanced chronic microvascular ischemic disease. 5. Multiple chronic microhemorrhages, predominantly clustered about the thalami, favored to be related to chronic poorly controlled hypertension  given distribution. 6. Underlying atrophy with advanced chronic small vessel ischemic disease. MRA HEAD IMPRESSION: 1. Motion degraded exam. 2. Grossly stable appearance of the intracranial circulation. No large vessel occlusion. Scattered atheromatous change about the carotid siphons, right ACA, and PCAs. No  definite new or progressive finding. Electronically Signed   By: Jeannine Boga M.D.   On: 06/13/2020 23:29   MR BRAIN W WO CONTRAST  Result Date: 06/13/2020 CLINICAL DATA:  Initial evaluation for neuro deficit, stroke suspected. History of recent right ACA territory infarct. EXAM: MRI HEAD WITHOUT AND WITH CONTRAST MRA HEAD WITHOUT CONTRAST TECHNIQUE: Multiplanar, multiecho pulse sequences of the brain and surrounding structures were obtained without and with intravenous contrast. Angiographic images of the head were obtained using MRA technique without contrast. CONTRAST:  6.72m GADAVIST GADOBUTROL 1 MMOL/ML IV SOLN COMPARISON:  Comparison made with CT from earlier same day as well as previous CTA and MRI from 04/27/2020. FINDINGS: MRI HEAD FINDINGS Brain: Diffuse prominence of the CSF containing spaces compatible with generalized age-related cerebral atrophy. Patchy and confluent T2/FLAIR hyperintensity within the periventricular and deep white matter both cerebral hemispheres most consistent with chronic small vessel ischemic disease, advanced in nature. Patchy involvement of the deep gray nuclei, brainstem, and cerebellum noted as well. Few scattered remote lacunar infarcts present about the hemispheric cerebral white matter and thalami. 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 Associated enhancement seen following contrast administration. No associated hemorrhage or significant mass effect. Few additional scattered foci of restricted diffusion involving the subcortical posterior left frontal region, right periatrial white matter, and splenium of the corpus callosum also consistent with acute to subacute ischemic infarcts. No associated hemorrhage. These findings are new as compared to prior exam.  1.4 x 2.3 x 1.7 cm well-circumscribed lobulated T1/FLAIR hyperintense lesion seen involving the left cerebellum (series 4, image 7). Lesion demonstrates fairly avid root restricted diffusion. No significant associated enhancement or regional mass effect. Finding favored to reflect an evolving subacute hematoma, new as compared to prior MRI. Finding is difficult to see on prior CT performed earlier the same day. No other findings to suggest acute or subacute intracranial hemorrhage. Multiple additional chronic microhemorrhages noted, predominantly clustered about the thalami, favored to be related to chronic underlying poorly controlled hypertension given distribution. This additional finding suggests that the left cerebellar hematoma may be hypertensive in nature as well. No mass lesion, mass effect, or midline shift. No hydrocephalus or extra-axial fluid collection. No other abnormal enhancement. Pituitary gland and suprasellar region within normal limits. Midline structures intact. Vascular: Major intracranial vascular flow voids are maintained. Skull and upper cervical spine: Craniocervical junction within normal limits. Bone marrow signal intensity normal. No scalp soft tissue abnormality. Sinuses/Orbits: Globes and orbital soft tissues within normal limits. Left maxillary sinus retention cyst. Paranasal sinuses are otherwise clear. No mastoid effusion. Other: None. MRA HEAD FINDINGS ANTERIOR CIRCULATION: Examination moderately degraded by motion artifact. Visualized distal cervical segments of the internal carotid arteries are patent with symmetric antegrade flow. Petrous segments patent bilaterally. Scattered atheromatous change noted throughout the carotid siphons, grossly similar to previous CTA. A1 segments patent bilaterally. Normal anterior communicating artery complex. Left ACA widely patent. Right A2 segment hypoplastic and diminutive, but remains grossly patent. M1 segments patent bilaterally. Normal  MCA bifurcations. Distal MCA branches well perfused and symmetric. POSTERIOR CIRCULATION: Vertebral arteries patent  to the vertebrobasilar junction without stenosis. Left PICA patent at its origin. Right PICA not seen. Basilar grossly patent to its distal aspect without appreciable stenosis. Superior cerebral arteries patent bilaterally. Both PCAs primarily supplied via the basilar. Scattered atheromatous change about the PCAs bilaterally without occlusion, grossly similar to previous. No appreciable aneurysm. IMPRESSION: MRI HEAD IMPRESSION: 1. 1.4 x 2.3 x 1.7 cm well-circumscribed lobulated T1/FLAIR hyperintense lesion involving the left cerebellum, favored to reflect an evolving subacute hematoma, new as compared to prior MRI. No significant regional mass effect. Finding suspected to be hypertensive in nature. 2. Interval expansion and enlargement of previously identified right ACA territory infarct, now moderate in size and acute to subacute in appearance. No significant mass effect. 3. Few additional scattered acute to subacute ischemic nonhemorrhagic infarcts involving the subcortical posterior left frontal region, right periatrial white matter, and splenium of the corpus callosum. 4. Underlying age-related cerebral atrophy with advanced chronic microvascular ischemic disease. 5. Multiple chronic microhemorrhages, predominantly clustered about the thalami, favored to be related to chronic poorly controlled hypertension given distribution. 6. Underlying atrophy with advanced chronic small vessel ischemic disease. MRA HEAD IMPRESSION: 1. Motion degraded exam. 2. Grossly stable appearance of the intracranial circulation. No large vessel occlusion. Scattered atheromatous change about the carotid siphons, right ACA, and PCAs. No definite new or progressive finding. Electronically Signed   By: Jeannine Boga M.D.   On: 06/13/2020 23:29   CT HEAD CODE STROKE WO CONTRAST  Result Date: 06/13/2020 CLINICAL  DATA:  Code stroke. Initial evaluation for acute left facial droop, slurred speech. Patient has a history of a recent right ACA territory infarct. EXAM: CT HEAD WITHOUT CONTRAST TECHNIQUE: Contiguous axial images were obtained from the base of the skull through the vertex without intravenous contrast. COMPARISON:  Prior CT from 09/19/2015. Additionally, there is a previous brain MRI from 04/27/2020, reviewed in epic, but not available for review in PACS. FINDINGS: Brain: Generalized age-related cerebral atrophy. Patchy and confluent hypodensity involving the periventricular deep white matter of both cerebral hemispheres most consistent with chronic small vessel ischemic disease. Patchy involvement of the deep gray nuclei, brainstem, and cerebellum noted as well. Appearance is fairly advanced in nature. Few scattered remote lacunar infarcts noted about the bilateral thalami. There is cortical and subcortical hypodensity involving the parasagittal right frontoparietal region, consistent with right ACA territory ischemia. Finding corresponds with previously identified right ACA territory infarct. However, appearance is somewhat more prominent on current exam as compared to previous brain MRI, raising the possibility for possible interval expansion and/or new acute component. Punctate hyperdensity at the posterior aspect of this area of ischemia suspicious for a small focus of associated petechial hemorrhage (series 3, image 23). No frank hemorrhagic transformation or mass effect. No other evidence for acute large vessel territory infarct. Gray-white matter differentiation otherwise grossly maintained. No mass lesion or midline shift. No hydrocephalus or extra-axial fluid collection. Vascular: No hyperdense vessel. Scattered vascular calcifications noted within the carotid siphons. Skull: Scalp soft tissues and calvarium within normal limits. Sinuses/Orbits: Globes and orbital soft tissues demonstrate no acute finding.  Left maxillary sinus retention cyst noted. Paranasal sinuses are otherwise largely clear. No mastoid effusion. Other: Poor dentition noted. ASPECTS [facility] Stroke Program Early CT Score) - Ganglionic level infarction (caudate, lentiform nuclei, internal capsule, insula, M1-M3 cortex): 7 - Supraganglionic infarction (M4-M6 cortex): 3 Total score (0-10 with 10 being normal): 10 IMPRESSION: 1. Cortical and subcortical hypodensity involving the parasagittal right frontoparietal region, consistent with previously identified right ACA  territory infarct. Appearance is somewhat more prominent on current exam as compared to previous brain MRI, raising the possibility for possible interval expansion and/or new acute component. Punctate hyperdensity at the posterior aspect of this area of ischemia suspicious for a small focus of associated petechial hemorrhage. No frank hemorrhagic transformation or mass effect. 2. ASPECTS is 10. 3. 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 These results were communicated to Dr. Rory Percy at 9:35 pmon 10/4/2021by text page via the [facility] messaging system. Findings also discussed by telephone with Dr. Rory Percy at time of dictation. Electronically Signed   By: Jeannine Boga M.D.   On: 06/13/2020 21:49        Scheduled Meds: .  stroke: mapping our early stages of recovery book   Does not apply Once  . atorvastatin  40 mg Oral Daily   Continuous Infusions: . sodium chloride 100 mL/hr at 06/14/20 0334     LOS: 1 day    Time spent: 35 mins,More than 50% of that time was spent in counseling and/or coordination of care.      Shelly Coss, MD [facility] P10/01/2020, 8:50 AM

## 2020-06-14 NOTE — Progress Notes (Signed)
Patient arrived to [phone] with daughter at bedside. Pt has residual L side hemiparesis from previous stroke. Pt denies pain. POC provided to patient and daughter. Tele notified. Call bell within reach. Bed in lowest position. Bed alarm on.

## 2020-06-15 ENCOUNTER — Encounter (HOSPITAL_COMMUNITY): Payer: Self-pay | Admitting: Internal Medicine

## 2020-06-15 ENCOUNTER — Encounter (HOSPITAL_COMMUNITY)
Admission: EM | Disposition: A | Discharge status: Skilled Nursing Facility | Payer: Self-pay | Source: Home / Self Care | Attending: Internal Medicine

## 2020-06-15 ENCOUNTER — Inpatient Hospital Stay (HOSPITAL_COMMUNITY): Payer: 59 | Admitting: Anesthesiology

## 2020-06-15 ENCOUNTER — Inpatient Hospital Stay (HOSPITAL_COMMUNITY): Payer: 59

## 2020-06-15 DIAGNOSIS — I161 Hypertensive emergency: Secondary | ICD-10-CM

## 2020-06-15 DIAGNOSIS — N1831 Chronic kidney disease, stage 3a: Secondary | ICD-10-CM

## 2020-06-15 DIAGNOSIS — I340 Nonrheumatic mitral (valve) insufficiency: Secondary | ICD-10-CM | POA: Diagnosis not present

## 2020-06-15 DIAGNOSIS — I639 Cerebral infarction, unspecified: Secondary | ICD-10-CM | POA: Diagnosis not present

## 2020-06-15 HISTORY — PX: BUBBLE STUDY: SHX6837

## 2020-06-15 HISTORY — PX: TEE WITHOUT CARDIOVERSION: SHX5443

## 2020-06-15 LAB — CBC WITH DIFFERENTIAL/PLATELET
Abs Immature Granulocytes: 0.02 10*3/uL (ref 0.00–0.07)
Basophils Absolute: 0 10*3/uL (ref 0.0–0.1)
Basophils Relative: 1 %
Eosinophils Absolute: 0.2 10*3/uL (ref 0.0–0.5)
Eosinophils Relative: 3 %
HCT: 30.3 % — ABNORMAL LOW (ref 36.0–46.0)
Hemoglobin: 9.6 g/dL — ABNORMAL LOW (ref 12.0–15.0)
Immature Granulocytes: 0 %
Lymphocytes Relative: 21 %
Lymphs Abs: 1.3 10*3/uL (ref 0.7–4.0)
MCH: 27.7 pg (ref 26.0–34.0)
MCHC: 31.7 g/dL (ref 30.0–36.0)
MCV: 87.6 fL (ref 80.0–100.0)
Monocytes Absolute: 0.7 10*3/uL (ref 0.1–1.0)
Monocytes Relative: 12 %
Neutro Abs: 3.7 10*3/uL (ref 1.7–7.7)
Neutrophils Relative %: 63 %
Platelets: 157 10*3/uL (ref 150–400)
RBC: 3.46 MIL/uL — ABNORMAL LOW (ref 3.87–5.11)
RDW: 14.6 % (ref 11.5–15.5)
WBC: 5.9 10*3/uL (ref 4.0–10.5)
nRBC: 0 % (ref 0.0–0.2)

## 2020-06-15 LAB — BASIC METABOLIC PANEL
Anion gap: 10 (ref 5–15)
BUN: 18 mg/dL (ref 6–20)
CO2: 22 mmol/L (ref 22–32)
Calcium: 9.8 mg/dL (ref 8.9–10.3)
Chloride: 106 mmol/L (ref 98–111)
Creatinine, Ser: 1.45 mg/dL — ABNORMAL HIGH (ref 0.44–1.00)
GFR calc non Af Amer: 40 mL/min — ABNORMAL LOW (ref >60)
Glucose, Bld: 101 mg/dL — ABNORMAL HIGH (ref 70–99)
Potassium: 3.3 mmol/L — ABNORMAL LOW (ref 3.5–5.1)
Sodium: 138 mmol/L (ref 135–145)

## 2020-06-15 LAB — ANCA TITERS
Atypical P-ANCA titer: <1:20 {titer}
C-ANCA: <1:20 {titer}
P-ANCA: <1:20 {titer}

## 2020-06-15 LAB — PROTIME-INR
INR: 1.1 (ref 0.8–1.2)
Prothrombin Time: 14.2 seconds (ref 11.4–15.2)

## 2020-06-15 LAB — RAPID URINE DRUG SCREEN, HOSP PERFORMED
Amphetamines: NOT DETECTED
Barbiturates: NOT DETECTED
Benzodiazepines: NOT DETECTED
Cocaine: NOT DETECTED
Opiates: NOT DETECTED
Tetrahydrocannabinol: NOT DETECTED

## 2020-06-15 LAB — URINALYSIS, ROUTINE W REFLEX MICROSCOPIC
Bilirubin Urine: NEGATIVE
Glucose, UA: NEGATIVE mg/dL
Hgb urine dipstick: NEGATIVE
Ketones, ur: 5 mg/dL — AB
Leukocytes,Ua: NEGATIVE
Nitrite: NEGATIVE
Protein, ur: NEGATIVE mg/dL
Specific Gravity, Urine: 1.017 (ref 1.005–1.030)
pH: 5 (ref 5.0–8.0)

## 2020-06-15 LAB — ANTI-DNA ANTIBODY, DOUBLE-STRANDED: ds DNA Ab: 3 [IU]/mL (ref 0–9)

## 2020-06-15 LAB — SJOGRENS SYNDROME-B EXTRACTABLE NUCLEAR ANTIBODY: SSB (La) (ENA) Antibody, IgG: <0.2 AI (ref 0.0–0.9)

## 2020-06-15 LAB — RPR: RPR Ser Ql: NONREACTIVE

## 2020-06-15 LAB — SJOGRENS SYNDROME-A EXTRACTABLE NUCLEAR ANTIBODY: SSA (Ro) (ENA) Antibody, IgG: <0.2 AI (ref 0.0–0.9)

## 2020-06-15 LAB — MAGNESIUM: Magnesium: 1.8 mg/dL (ref 1.7–2.4)

## 2020-06-15 SURGERY — ECHOCARDIOGRAM, TRANSESOPHAGEAL
Anesthesia: Monitor Anesthesia Care

## 2020-06-15 MED ORDER — LIDOCAINE HCL 1 % IJ SOLN
5.0000 mL | Freq: Once | INTRAMUSCULAR | Status: DC
  Filled 2020-06-15 (×2): qty 5

## 2020-06-15 MED ORDER — LIDOCAINE 2% (20 MG/ML) 5 ML SYRINGE
INTRAMUSCULAR | Status: DC | PRN
  Administered 2020-06-15: 100 mg via INTRAVENOUS

## 2020-06-15 MED ORDER — FERROUS SULFATE 325 (65 FE) MG PO TABS
325.0000 mg | ORAL_TABLET | Freq: Two times a day (BID) | ORAL | Status: DC
  Administered 2020-06-16 – 2020-06-18 (×6): 325 mg via ORAL
  Filled 2020-06-15 (×7): qty 1

## 2020-06-15 MED ORDER — POTASSIUM CHLORIDE 10 MEQ/100ML IV SOLN
10.0000 meq | INTRAVENOUS | Status: AC
  Administered 2020-06-15 (×2): 10 meq via INTRAVENOUS
  Filled 2020-06-15 (×3): qty 100

## 2020-06-15 MED ORDER — FLUOXETINE HCL 10 MG PO CAPS
10.0000 mg | ORAL_CAPSULE | Freq: Every day | ORAL | Status: DC
  Administered 2020-06-16 – 2020-06-18 (×3): 10 mg via ORAL
  Filled 2020-06-15 (×4): qty 1

## 2020-06-15 MED ORDER — PHENYLEPHRINE 40 MCG/ML (10ML) SYRINGE FOR IV PUSH (FOR BLOOD PRESSURE SUPPORT): PREFILLED_SYRINGE | INTRAVENOUS | Status: DC | PRN

## 2020-06-15 MED ORDER — HYDRALAZINE HCL 20 MG/ML IJ SOLN
10.0000 mg | Freq: Four times a day (QID) | INTRAMUSCULAR | Status: DC | PRN
  Administered 2020-06-16 – 2020-06-18 (×2): 10 mg via INTRAVENOUS
  Filled 2020-06-15 (×2): qty 1

## 2020-06-15 MED ORDER — LABETALOL HCL 5 MG/ML IV SOLN
INTRAVENOUS | Status: AC
  Filled 2020-06-15: qty 4

## 2020-06-15 MED ORDER — HYDRALAZINE HCL 50 MG PO TABS
50.0000 mg | ORAL_TABLET | Freq: Three times a day (TID) | ORAL | Status: DC
  Administered 2020-06-15 – 2020-06-18 (×8): 50 mg via ORAL
  Filled 2020-06-15 (×8): qty 1

## 2020-06-15 MED ORDER — BUTAMBEN-TETRACAINE-BENZOCAINE 2-2-14 % EX AERO
INHALATION_SPRAY | CUTANEOUS | Status: DC | PRN
  Administered 2020-06-15: 2 via TOPICAL

## 2020-06-15 MED ORDER — PROPOFOL 10 MG/ML IV BOLUS
INTRAVENOUS | Status: DC | PRN
  Administered 2020-06-15: 40 mg via INTRAVENOUS

## 2020-06-15 MED ORDER — POTASSIUM CHLORIDE 10 MEQ/100ML IV SOLN
10.0000 meq | INTRAVENOUS | Status: AC
  Administered 2020-06-15 (×2): 10 meq via INTRAVENOUS
  Filled 2020-06-15: qty 100

## 2020-06-15 MED ORDER — PROPOFOL 500 MG/50ML IV EMUL
INTRAVENOUS | Status: DC | PRN
  Administered 2020-06-15: 100 ug/kg/min via INTRAVENOUS

## 2020-06-15 NOTE — Progress Notes (Signed)
Pt returned from procedural area at this time. Denies pain. VSS; Daughter at bedside.

## 2020-06-15 NOTE — Progress Notes (Signed)
  Echocardiogram 2D Echocardiogram has been performed.  [name] 06/15/2020, 12:37 PM

## 2020-06-15 NOTE — Transfer of Care (Signed)
Immediate Anesthesia Transfer of Care Note  Patient: [name]  Procedure(s) Performed: TRANSESOPHAGEAL ECHOCARDIOGRAM (TEE) (N/A ) BUBBLE STUDY  Patient Location: Endoscopy Unit  Anesthesia Type:MAC  Level of Consciousness: drowsy  Airway & Oxygen Therapy: Patient Spontanous Breathing and Patient connected to nasal cannula oxygen  Post-op Assessment: Report given to RN and Post -op Vital signs reviewed and stable  Post vital signs: Reviewed and stable  Last Vitals:  Vitals Value Taken Time  BP 143/72 06/15/20 1224  Temp 36.8 C 06/15/20 1224  Pulse 73 06/15/20 1227  Resp 20 06/15/20 1227  SpO2 100 % 06/15/20 1227  Vitals shown include unvalidated device data.  Last Pain:  Vitals:   06/15/20 1224  TempSrc: Axillary  PainSc: 0-No pain         Complications: No complications documented.

## 2020-06-15 NOTE — TOC Initial Note (Signed)
Transition of Care [facility]) - Initial/Assessment Note    Patient Details  Name: [name] MRN: [id number] Date of Birth: 1961/08/04  [facility] CM/SW Contact:    Geralynn Ochs, LCSW Phone Number: 06/15/2020, 4:27 PM  Clinical Narrative:    CSW met with patient to discuss SNF recommendation. CSW asked about patient's baseline, and if she felt much different than when she was at home, and patient said that she felt worse. Patient agreeable to rehab at SNF, and asked CSW to call her daughter, [name]. CSW spoke with [name] via phone and explained SNF recommendation. [name] in agreement, as she says that it has been difficult taking care of her mom at home because no one else helps except for her. CSW discussed with [name] about her process in applying for disability for the patient, as well as getting power of attorney and thinking about Medicaid. CSW discussed concern that insurance may not cover SNF at this time, but we would try. CSW completed referral and sent out to SNF in network with patient's insurance plan. CSW to follow up with bed offers tomorrow and initiate insurance authorization request.                Expected Discharge Plan: Skilled Nursing Facility Barriers to Discharge: Continued Medical Work up, Ship broker   Patient Goals and CMS Choice Patient states their goals for this hospitalization and ongoing recovery are:: to get to the rehab CMS Medicare.gov Compare Post Acute Care list provided to:: Patient Choice offered to / list presented to : Patient, Adult Children  Expected Discharge Plan and Services Expected Discharge Plan: [place] Choice: [place] Living arrangements for the past 2 months: Single Family Home                                      Prior Living Arrangements/Services Living arrangements for the past 2 months: Single Family Home Lives with:: Self,  Significant Other Patient language and need for interpreter reviewed:: No Do you feel safe going back to the place where you live?: Yes      Need for Family Participation in Patient Care: No (Comment) Care giver support system in place?: No (comment)   Criminal Activity/Legal Involvement Pertinent to Current Situation/Hospitalization: No - Comment as needed  Activities of Daily Living      Permission Sought/Granted Permission sought to share information with : Facility Sport and exercise psychologist, Family Supports Permission granted to share information with : Yes, Verbal Permission Granted  Share Information with NAME: [name]  Permission granted to share info w AGENCY: SNF  Permission granted to share info w Relationship: Daughter     Emotional Assessment Appearance:: Appears stated age Attitude/Demeanor/Rapport: Engaged Affect (typically observed): Appropriate Orientation: : Oriented to Self, Oriented to Place, Oriented to  Time, Oriented to Situation Alcohol / Substance Use: Not Applicable Psych Involvement: No (comment)  Admission diagnosis:  Vomiting [R11.10] Acute CVA (cerebrovascular accident) ([place]) [I63.9] Cerebrovascular accident (CVA), unspecified mechanism ([place]) [I63.9] AMS (altered mental status) [R41.82] Patient Active Problem List   Diagnosis Date Noted  . CKD (chronic kidney disease) stage 3, GFR 30-59 ml/min (HCC)   . Labile blood pressure   . Slow transit constipation   . Acute blood loss anemia   . Left inguinal pain   . Pain   . Spastic hemiparesis ([place])   .  Dysphagia, post-stroke   . Pressure injury of skin 05/04/2020  . Thrombocytosis   . Leukocytosis   . AKI (acute kidney injury) ([place])   . Benign essential HTN   . Lethargy   . CVA (cerebral vascular accident) ([place]) 05/03/2020  . Chronic combined systolic (congestive) and diastolic (congestive) heart failure ([place]) 05/02/2020  . Hypertensive emergency   . Slurred speech   . Essential hypertension    . Acute combined systolic (congestive) and diastolic (congestive) heart failure ([place])   . Thrombocytopenia ([place])   . Stage 3b chronic kidney disease ([place])   . Prediabetes   . Acute CVA (cerebrovascular accident) ([place]) 04/27/2020  . Accelerated hypertension 04/27/2020  . Renal dysfunction 04/27/2020  . Hypokalemia 04/27/2020  . HTN (hypertension) 10/26/2015  . Anemia, iron deficiency 10/26/2015  . Visit for preventive health examination 10/26/2015  . Osteoarthritis of both knees    PCP:  Argentina Donovan, PA-C Pharmacy:   [id number] [id number]: [phone]: [phone] [facility] [place] [address]Phone: [phone]: [phone]    Social Determinants of Health (SDOH) Interventions    Readmission Risk Interventions No flowsheet data found.

## 2020-06-15 NOTE — Progress Notes (Signed)
PROGRESS NOTE    [name]  WEX:[id number] DOB: 20-Apr-1961 DOA: 06/13/2020 PCP: Argentina Donovan, PA-C   Brief Narrative:  HPI per Dr. Zada Finders on 06/13/20 [name] is a 59 y.o. female with medical history significant for uncontrolled hypertension, CKD stage III, and recent stroke on 04/27/2020 involving right ACA territory (with residual left-sided hemiparesis and dysarthria) felt to be embolic due to unknown source who presents to the ED as a code stroke for evaluation of worsening left facial weakness and slurred speech compared to recent baseline.  Patient recently admitted 04/26/2020-05/03/2020 for acute right ACA branch infarct.  She was started on DAPT x3 weeks and transition to aspirin 81 mg alone.  She had negative work-up for source of stroke.  She had residual deficits of left-sided hemiparesis and dysarthria.  She was discharged to [facility] where she stayed until discharged from inpatient rehab 06/01/2020.  Patient was last known well around 2 PM today when family saw her.  On recheck afterwards they noticed that she had worsening left facial droop and slurred speech.  She was unable to move left side of her body which is unchanged since her recent stroke.  EMS were called and on their arrival she was noted to have SBP in the 200s.  Patient was reported to have recent severe constipation followed by a very large bowel movement.  She has had some episodes of emesis recently.  Currently patient has no complaints and denies chest pain, dyspnea, further nausea/vomiting, abdominal pain, dysuria.  She reports chronic pain in her left leg for which she has been taking tramadol and recently started on meloxicam.  ED Course:  Initial vitals showed BP 191/97, pulse 92, RR 18, SPO2 100% on room air.  Labs show WBC 8.7, hemoglobin 12.2, platelets 179,000, sodium 138, potassium 3.8, bicarb 20, BUN 18, creatinine 1.34, serum glucose 85, LFTs within normal limits.  I-STAT  beta-hCG <5.0.  Respiratory PCR panel is ordered and pending.  CT head without contrast showed previously identified right ACA territory infarct changes with appearance raising possibility for possible interval expansion and/or new acute component.  MRI brain and MRA head without contrast showed an evolving subacute hematoma involving the left cerebellum which is new compared to prior MRI.  Interval expansion and enlargement of previously identified right ACA territory infarct seen, now moderate in size and acute to subacute in appearance.  Scattered acute to subacute ischemic nonhemorrhagic infarcts involving the subcortical posterior left frontal region, right parietal white matter, and splenium of the corpus callosum also seen.  Multiple chronic microhemorrhages, predominantly clustered about the thalami identified.  Neurology were consulted and recommended medical admission for careful blood pressure management.  Recommending obtaining TEE and avoiding any antiplatelets or anticoagulants for now.  The hospitalist service was consulted to admit for further evaluation and management.  **Interim History Patient is undergoing full stroke work-up and underwent a TEE today which showed no evidence of intracardiac thrombus.  Patient has declined a loop recorder but because her inflammatory markers are still elevated and we still do not know why she has had these CVAs neurology clinic and doing an LP.  Inflammatory markers are still elevated.  Potassium was low this morning so we will be replete.  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 No significant regional mass effect. Finding suspected to be hypertensive in nature. Interval expansion and enlargement of previously identified right ACA territory infarct, now moderate in size and acute to subacute in appearance. No significant mass effect. Few additional scattered acute to subacute ischemic nonhemorrhagic infarcts involving the subcortical posterior left frontal region, right periatrial white matter, and splenium of the corpus callosum. Underlying age-related cerebral atrophy with advanced chronic microvascular ischemic disease. Multiple chronic microhemorrhages, predominantly clustered about the thalami, favored to be related to chronic poorly controlled hypertension given distribution. Underlying atrophy with advanced chronic small vessel ischemic disease." -Brain MRA showed "Motion degraded exam. Grossly stable appearance of the intracranial circulation. No large vessel occlusion. Scattered atheromatous change about the carotid siphons, right ACA, and PCAs. No definite new or progressive finding." -Neurology consulted and following.  Holding anticoagulants and antiplatelets at this time. -Cardiology consulted for TEE and showed "No Evidence of intracardiac thrombus. There is some evidence of concentric hypertrophy in the mid and apex without apical aneurysm. Consider outpatient, TTE with contrast, in follow up if clinically indicated."   -Patient Declines Loop recorder -Neuro Recommending LP and will pursue event unclear etiology of her CVAs.  Could be a vasculitis so neurology will pursue an LP as she has declined a loop recorder -Negative lower extremity Dopplers for DVT.   -PT/OT/SLP consulted and are recommending skilled nursing facility   -2D echo showed ejection fraction of 60 to 65%, grade 1  diastolic dysfunction, no intracardiac source of emboli. -She was taking Aspirin at home.we will continue aspirin 81 mg p.o. daily, as well as atorvastatin 40 mg daily -Her LDL as per August this year was 82 and hemoglobin A1c was 5.9.  Hypertensive Emergency -Severely hypertensive on presentation.  -Blood pressure goal of 140/90 but this Afternoon was 167/80 -Continue with amlodipine 10 mg p.o. daily, isosorbide mononitrate 60 mils p.o. daily, metoprolol 25 mg p.o. twice daily, and she has as needed medications labetalol 10 mg IV every 2 as needed for high blood pressure for systolic blood pressure of 180 or diastolic blood pressure of 100 and will add IV hydralazine 10 mg every 6 as needed for systolic blood pressure of 160 or diastolic blood pressure greater than 90 -Continue as needed meds for severe hypertension.  She is hypertensive and is more than 24 hours after presentation -Follow blood pressures per protocol  Nausea/Vomiting/Constipation -Probably nausea and vomiting related with intracranial hematoma.   -Currently much improved.  She was also constipated on presentation and she had a large bowel movement. -Continue with supportive care and antiemetics and bowel regimen IV fluid hydration has now stopped  CKD stage IIIa -Currently kidney function at baseline but mildly worsened -Patient's BUN/Cr went from 19/1.30 -> 18/1.45 -She is currently getting IV fluid hydration with normal saline at 50 mils per hour and will now stop -Avoid further nephrotoxic medications, contrast dyes, hypotension and renally dose medications -Repeat CMP in a.m.  Elevated Inflammatory Markers -Elevated CRP (4.0) and ESR (67).  ANA/RF negative.  Vasculitis is a possibility.We will check ANCAs and are negative -Neurology planning on doing an LP to rule out CNS vasculitis however this is unlikely per Dr. Denyse Amass recommendation.  Hypokalemia -Patient's potassium this morning was  3.3  -Replete with p.o.  potassium chloride 40 mEQ twice daily x2 doses  -Continue monitor and replete as necessary -Repeat CMP in the AM   Overweight -Estimated body mass index is 25.69 kg/m as calculated from the following:   Height as of 06/08/20: $RemoveBef'5\' 3"'[id number]$  (1.6 m).   Weight as of 06/08/20: 65.8 kg. -Continued Weight Loss and Dietary Counseling given   DVT prophylaxis: SCDs Code Status: FULL CODE Family Communication: No family present at bedside  Disposition Plan: Pending clearance by neurology but she will likely need skilled nursing facility  Status is: Inpatient  Remains inpatient appropriate because:Altered mental status, Ongoing diagnostic testing needed not appropriate for outpatient work up, IV treatments appropriate due to intensity of illness or inability to take PO and Inpatient level of care appropriate due to severity of illness   Dispo: The patient is from: Home              Anticipated d/c is to: SNF              Anticipated d/c date is: 2 days              Patient currently is not medically stable to d/c.  Consultants:   Neurology  Cardiology for TEE   Procedures:  ECHOCARDIOGRAM IMPRESSIONS    1. Intracavitary gradient, peak velocity 2.38 m/s. Peak gradient 22.7  mmHg.Marland Kitchen Left ventricular ejection fraction, by estimation, is 60 to 65%.  The left ventricle has normal function. The left ventricle has no regional  wall motion abnormalities. There is  mild concentric left ventricular hypertrophy. Left ventricular diastolic  parameters are consistent with Grade I diastolic dysfunction (impaired  relaxation). Elevated left ventricular end-diastolic pressure.  2. Right ventricular systolic function is normal. The right ventricular  size is normal. There is normal pulmonary artery systolic pressure.  3. Left atrial size was mildly dilated.  4. The mitral valve is normal in structure. No evidence of mitral valve  regurgitation. No evidence of mitral stenosis.  5. The aortic valve is  normal in structure. Aortic valve regurgitation is  not visualized. No aortic stenosis is present.  6. The inferior vena cava is normal in size with greater than 50%  respiratory variability, suggesting right atrial pressure of 3 mmHg.   FINDINGS  Left Ventricle: Intracavitary gradient, peak velocity 2.38 m/s. Peak  gradient 22.7 mmHg. Left ventricular ejection fraction, by estimation, is  60 to 65%. The left ventricle has normal function. The left ventricle has  no regional wall motion  abnormalities. The left ventricular internal cavity size was normal in  size. There is mild concentric left ventricular hypertrophy. Left  ventricular diastolic parameters are consistent with Grade I diastolic  dysfunction (impaired relaxation). Elevated  left ventricular end-diastolic pressure.   Right Ventricle: The right ventricular size is normal. No increase in  right ventricular wall thickness. Right ventricular systolic function is  normal. There is normal pulmonary artery systolic pressure. The tricuspid  regurgitant velocity is 1.24 m/s, and  with an assumed right atrial pressure of 3 mmHg, the estimated right  ventricular systolic pressure is 9.2 mmHg.   Left Atrium: Left atrial size was mildly dilated.   Right Atrium: Right atrial size was normal in size.   Pericardium: There is no evidence of pericardial effusion.   Mitral Valve: The mitral valve is normal in structure. No evidence of  mitral valve regurgitation. No evidence of mitral valve stenosis.   Tricuspid Valve: The tricuspid valve is normal in  structure. Tricuspid  valve regurgitation is trivial. No evidence of tricuspid stenosis.   Aortic Valve: The aortic valve is normal in structure. Aortic valve  regurgitation is not visualized. No aortic stenosis is present.   Pulmonic Valve: The pulmonic valve was normal in structure. Pulmonic valve  regurgitation is not visualized. No evidence of pulmonic stenosis.   Aorta: The  aortic root is normal in size and structure.   Venous: The inferior vena cava is normal in size with greater than 50%  respiratory variability, suggesting right atrial pressure of 3 mmHg.   IAS/Shunts: No atrial level shunt detected by color flow Doppler.     LEFT VENTRICLE  PLAX 2D  LVIDd:     4.88 cm Diastology  LVIDs:     3.47 cm LV e' medial:  3.65 cm/s  LV PW:     1.23 cm LV E/e' medial: 15.3  LV IVS:    1.07 cm LV e' lateral:  3.97 cm/s  LVOT diam:   1.90 cm LV E/e' lateral: 14.1  LV SV:     73  LV SV Index:  44  LVOT Area:   2.84 cm     RIGHT VENTRICLE  RV S prime:   15.80 cm/s  TAPSE (M-mode): 1.7 cm   LEFT ATRIUM       Index    RIGHT ATRIUM      Index  LA diam:    3.50 cm 2.08 cm/m RA Area:   13.20 cm  LA Vol (A2C):  59.9 ml 35.51 ml/m RA Volume:  29.70 ml 17.61 ml/m  LA Vol (A4C):  40.1 ml 23.78 ml/m  LA Biplane Vol: 50.2 ml 29.76 ml/m  AORTIC VALVE  LVOT Vmax:  155.00 cm/s  LVOT Vmean: 121.000 cm/s  LVOT VTI:  0.259 m    AORTA  Ao Root diam: 2.70 cm  Ao Asc diam: 2.60 cm   MITRAL VALVE        TRICUSPID VALVE  MV Area (PHT): 2.50 cm  TR Peak grad:  6.2 mmHg  MV Decel Time: 303 msec  TR Vmax:    124.00 cm/s  MV E velocity: 56.00 cm/s  MV A velocity: 74.40 cm/s SHUNTS  MV E/A ratio: 0.75    Systemic VTI: 0.26 m               Systemic Diam: 1.90 cm   TEE IMPRESSIONS    1. Left ventricular ejection fraction, by estimation, is 60 to 65%. The  left ventricle has normal function. The left ventricle has no regional  wall motion abnormalities. Left ventricular diastolic function could not  be evaluated.  2. Right ventricular systolic function is normal. The right ventricular  size is normal.  3. Left atrial size was mildly dilated. No left atrial/left atrial  appendage thrombus was detected.  4. The mitral valve is normal in structure. Mild  mitral valve  regurgitation.  5. The aortic valve is tricuspid. Aortic valve regurgitation is not  visualized.  6. There is mild (Grade II) plaque.  7. Agitated saline contrast bubble study was negative, with no evidence  of any interatrial shunt.   Conclusion(s)/Recommendation(s): No Evidence of intracardiac thrombus.  There is some evidence of concentric hypertrophy in the mid and apex  without apical aneurysm. Consider outpatient, TTE with contrast, in follow  up if clinically indicated.   FINDINGS  Left Ventricle: There is evidence of midventricular and apical  hypertrophy, best observed in deep gastric imaging. No denfinitive  evidence  for apical hyperrophy (HCM Variant). Left ventricular ejection  fraction, by estimation, is 60 to 65%. The left  ventricle has normal function. The left ventricle has no regional wall  motion abnormalities. The left ventricular internal cavity size was normal  in size. Left ventricular diastolic function could not be evaluated.   Right Ventricle: The right ventricular size is normal. No increase in  right ventricular wall thickness. Right ventricular systolic function is  normal.   Left Atrium: Left atrial size was mildly dilated. No left atrial/left  atrial appendage thrombus was detected.   Right Atrium: Prominent Eustachain ridge. Right atrial size was normal in  size.   Pericardium: Trivial pericardial effusion is present.   Mitral Valve: The mitral valve is normal in structure. Mild mitral valve  regurgitation.   Tricuspid Valve: The tricuspid valve is normal in structure. Tricuspid  valve regurgitation is trivial.   Aortic Valve: The aortic valve is tricuspid. Aortic valve regurgitation is  not visualized.   Pulmonic Valve: The pulmonic valve was grossly normal. Pulmonic valve  regurgitation is not visualized.   Aorta: The aortic root and ascending aorta are structurally normal, with  no evidence of dilitation. There is  mild (Grade II) plaque.   IAS/Shunts: No atrial level shunt detected by color flow Doppler. Agitated  saline contrast was given intravenously to evaluate for intracardiac  shunting. Agitated saline contrast bubble study was negative, with no  evidence of any interatrial shunt.     LEFT VENTRICLE  PLAX 2D  LVOT diam:   2.10 cm  LVOT Area:   3.46 cm       AORTA  Ao Root diam: 2.80 cm  Ao Asc diam: 2.90 cm     SHUNTS  Systemic Diam: 2.10 cm   Antimicrobials:   Anti-infectives (From admission, onward)   None        Subjective: Seen and examined and she states that she was doing okay and had no complaints but was little hungry and awaiting her TEE.  No nausea or vomiting as this is improved.  Had a large bowel movement a few days ago.  No other concerns or complaints at this time but still feels weak on the left side compared to the right.  Objective: Vitals:   06/15/20 1224 06/15/20 1235 06/15/20 1251 06/15/20 1536  BP: (!) 143/72 (!) 157/84 (!) 153/76 (!) 167/80  Pulse: 72 77 70 74  Resp: $Remo'15 18 16 12  'zerym$ Temp: 98.2 F (36.8 C)   98.4 F (36.9 C)  TempSrc: Axillary   Axillary  SpO2: 100% 100% 98% 99%    Intake/Output Summary (Last 24 hours) at 06/15/2020 1648 Last data filed at 06/15/2020 1218 Gross per 24 hour  Intake 400 ml  Output --  Net 400 ml   There were no vitals filed for this visit.  Examination: Physical Exam:  Constitutional: WN/WD overweight African-American female currently no acute distress appears calm and comfortable  Eyes: Had her hair piece covering her eyes ENMT: External Ears, Nose appear normal. Grossly normal hearing.  Neck: Appears normal, supple, no cervical masses, normal ROM, no appreciable thyromegaly of only JVD Respiratory: Diminished to auscultation bilaterally, no wheezing, rales, rhonchi or crackles. Normal respiratory effort and patient is not tachypenic. No accessory muscle use.  Unlabored breathing Cardiovascular:  RRR, no murmurs / rubs / gallops. S1 and S2 auscultated.  Mild extremity edema Abdomen: Soft, non-tender, slightly distended secondary body habitus. Bowel sounds positive.  GU: Deferred. Musculoskeletal: No clubbing / cyanosis  of digits/nails. No joint deformity upper and lower extremities.  Skin: No rashes, lesions, ulcers. No induration; Warm and dry.  Neurologic: CN 2-12 grossly intact with no focal deficits.  She seems a little weaker on the left compared to right Psychiatric: Normal judgment and insight. Alert and oriented x 3. Normal mood and appropriate affect.   Data Reviewed: I have personally reviewed following labs and imaging studies  CBC: Recent Labs  Lab 06/13/20 2115 06/13/20 2126 06/15/20 0842  WBC 8.7  --  5.9  NEUTROABS 5.7  --  3.7  HGB 12.2 12.9 9.6*  HCT 38.0 38.0 30.3*  MCV 89.8  --  87.6  PLT 179  --  885   Basic Metabolic Panel: Recent Labs  Lab 06/13/20 2115 06/13/20 2126 06/15/20 0318 06/15/20 0842  NA 138 139 138  --   K 3.8 3.5 3.3*  --   CL 104 105 106  --   CO2 20*  --  22  --   GLUCOSE 85 90 101*  --   BUN $Re'18 19 18  'lHe$ --   CREATININE 1.34* 1.30* 1.45*  --   CALCIUM 10.3  --  9.8  --   MG  --   --   --  1.8   GFR: Estimated Creatinine Clearance: 38.6 mL/min (A) (by C-G formula based on SCr of 1.45 mg/dL (H)). Liver Function Tests: Recent Labs  Lab 06/13/20 2115  AST 20  ALT 26  ALKPHOS 83  BILITOT 0.6  PROT 7.8  ALBUMIN 3.4*   No results for input(s): LIPASE, AMYLASE in the last 168 hours. No results for input(s): AMMONIA in the last 168 hours. Coagulation Profile: Recent Labs  Lab 06/13/20 2115 06/15/20 0318  INR 1.0 1.1   Cardiac Enzymes: No results for input(s): CKTOTAL, CKMB, CKMBINDEX, TROPONINI in the last 168 hours. BNP (last 3 results) No results for input(s): PROBNP in the last 8760 hours. HbA1C: No results for input(s): HGBA1C in the last 72 hours. CBG: Recent Labs  Lab 06/13/20 2115  GLUCAP 88   Lipid  Profile: Recent Labs    06/13/20 2115  CHOL 132  HDL 73  LDLCALC 47  TRIG 61  CHOLHDL 1.8   Thyroid Function Tests: No results for input(s): TSH, T4TOTAL, FREET4, T3FREE, THYROIDAB in the last 72 hours. Anemia Panel: No results for input(s): VITAMINB12, FOLATE, FERRITIN, TIBC, IRON, RETICCTPCT in the last 72 hours. Sepsis Labs: No results for input(s): PROCALCITON, LATICACIDVEN in the last 168 hours.  Recent Results (from the past 240 hour(s))  Respiratory Panel by RT PCR (Flu A&B, Covid) - Nasopharyngeal Swab     Status: None   Collection Time: 06/14/20  1:38 AM   Specimen: Nasopharyngeal Swab  Result Value Ref Range Status   SARS Coronavirus 2 by RT PCR NEGATIVE NEGATIVE Final    Comment: (NOTE) SARS-CoV-2 target nucleic acids are NOT DETECTED.  The SARS-CoV-2 RNA is generally detectable in upper respiratoy specimens during the acute phase of infection. The lowest concentration of SARS-CoV-2 viral copies this assay can detect is 131 copies/mL. A negative result does not preclude SARS-Cov-2 infection and should not be used as the sole basis for treatment or other patient management decisions. A negative result may occur with  improper specimen collection/handling, submission of specimen other than nasopharyngeal swab, presence of viral mutation(s) within the areas targeted by this assay, and inadequate number of viral copies (<131 copies/mL). A negative result must be combined with clinical observations, patient history, and epidemiological  information. The expected result is Negative.  Fact Sheet for Patients:  PinkCheek.be  Fact Sheet for Healthcare Providers:  GravelBags.it  This test is no t yet approved or cleared by the [place] FDA and  has been authorized for detection and/or diagnosis of SARS-CoV-2 by FDA under an Emergency Use Authorization (EUA). This EUA will remain  in effect (meaning this test  can be used) for the duration of the COVID-19 declaration under Section 564(b)(1) of the Act, 21 U.S.C. section 360bbb-3(b)(1), unless the authorization is terminated or revoked sooner.     Influenza A by PCR NEGATIVE NEGATIVE Final   Influenza B by PCR NEGATIVE NEGATIVE Final    Comment: (NOTE) The Xpert Xpress SARS-CoV-2/FLU/RSV assay is intended as an aid in  the diagnosis of influenza from Nasopharyngeal swab specimens and  should not be used as a sole basis for treatment. Nasal washings and  aspirates are unacceptable for Xpert Xpress SARS-CoV-2/FLU/RSV  testing.  Fact Sheet for Patients: PinkCheek.be  Fact Sheet for Healthcare Providers: GravelBags.it  This test is not yet approved or cleared by the [place] FDA and  has been authorized for detection and/or diagnosis of SARS-CoV-2 by  FDA under an Emergency Use Authorization (EUA). This EUA will remain  in effect (meaning this test can be used) for the duration of the  Covid-19 declaration under Section 564(b)(1) of the Act, 21  U.S.C. section 360bbb-3(b)(1), unless the authorization is  terminated or revoked. Performed at [facility], [place] [address] [place], [place] [address]      RN Pressure Injury Documentation:     Estimated body mass index is 25.69 kg/m as calculated from the following:   Height as of 06/08/20: $RemoveBef'5\' 3"'[id number]$  (1.6 m).   Weight as of 06/08/20: 65.8 kg.  Malnutrition Type:      Malnutrition Characteristics:      Nutrition Interventions:    Radiology Studies: DG Chest 1 View  Result Date: 06/14/2020 CLINICAL DATA:  Vomiting.  Altered mental status. EXAM: CHEST  1 VIEW COMPARISON:  05/22/2020 FINDINGS: The heart is enlarged but stable from prior study. Both lungs are clear. The visualized skeletal structures are unremarkable. There are atherosclerotic changes of the thoracic aorta. IMPRESSION: No active disease. Electronically  Signed   By: Constance Holster M.D.   On: 06/14/2020 00:47   DG Abd 1 View  Result Date: 06/14/2020 CLINICAL DATA:  Altered mental status with nausea and vomiting. EXAM: ABDOMEN - 1 VIEW COMPARISON:  05/23/2020 FINDINGS: The bowel gas pattern is normal. No radio-opaque calculi or other significant radiographic abnormality are seen. IMPRESSION: Negative. Electronically Signed   By: Constance Holster M.D.   On: 06/14/2020 00:48   MR ANGIO HEAD WO CONTRAST  Result Date: 06/13/2020 CLINICAL DATA:  Initial evaluation for neuro deficit, stroke suspected. History of recent right ACA territory infarct. EXAM: MRI HEAD WITHOUT AND WITH CONTRAST MRA HEAD WITHOUT CONTRAST TECHNIQUE: Multiplanar, multiecho pulse sequences of the brain and surrounding structures were obtained without and with intravenous contrast. Angiographic images of the head were obtained using MRA technique without contrast. CONTRAST:  6.21mL GADAVIST GADOBUTROL 1 MMOL/ML IV SOLN COMPARISON:  Comparison made with CT from earlier same day as well as previous CTA and MRI from 04/27/2020. FINDINGS: MRI HEAD FINDINGS Brain: Diffuse prominence of the CSF containing spaces compatible with generalized age-related cerebral atrophy. Patchy and confluent T2/FLAIR hyperintensity within the periventricular and deep white matter both cerebral hemispheres most consistent with chronic small vessel ischemic disease, advanced  in nature. Patchy involvement of the deep gray nuclei, brainstem, and cerebellum noted as well. Few scattered remote lacunar infarcts present about the hemispheric cerebral white matter and thalami. 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 Associated enhancement seen following contrast administration. No associated hemorrhage or significant  mass effect. Few additional scattered foci of restricted diffusion involving the subcortical posterior left frontal region, right periatrial white matter, and splenium of the corpus callosum also consistent with acute to subacute ischemic infarcts. No associated hemorrhage. These findings are new as compared to prior exam. 1.4 x 2.3 x 1.7 cm well-circumscribed lobulated T1/FLAIR hyperintense lesion seen involving the left cerebellum (series 4, image 7). Lesion demonstrates fairly avid root restricted diffusion. No significant associated enhancement or regional mass effect. Finding favored to reflect an evolving subacute hematoma, new as compared to prior MRI. Finding is difficult to see on prior CT performed earlier the same day. No other findings to suggest acute or subacute intracranial hemorrhage. Multiple additional chronic microhemorrhages noted, predominantly clustered about the thalami, favored to be related to chronic underlying poorly controlled hypertension given distribution. This additional finding suggests that the left cerebellar hematoma may be hypertensive in nature as well. No mass lesion, mass effect, or midline shift. No hydrocephalus or extra-axial fluid collection. No other abnormal enhancement. Pituitary gland and suprasellar region within normal limits. Midline structures intact. Vascular: Major intracranial vascular flow voids are maintained. Skull and upper cervical spine: Craniocervical junction within normal limits. Bone marrow signal intensity normal. No scalp soft tissue abnormality. Sinuses/Orbits: Globes and orbital soft tissues within normal limits. Left maxillary sinus retention cyst. Paranasal sinuses are otherwise clear. No mastoid effusion. Other: None. MRA HEAD FINDINGS ANTERIOR CIRCULATION: Examination moderately degraded by motion artifact. Visualized distal cervical segments of the internal carotid arteries are patent with symmetric antegrade flow. Petrous segments patent  bilaterally. Scattered atheromatous change noted throughout the carotid siphons, grossly similar to previous CTA. A1 segments patent bilaterally. Normal anterior communicating artery complex. Left ACA widely patent. Right A2 segment hypoplastic and diminutive, but remains grossly patent. M1 segments patent bilaterally. Normal MCA bifurcations. Distal MCA branches well perfused and symmetric. POSTERIOR CIRCULATION: Vertebral arteries patent to the vertebrobasilar junction without stenosis. Left PICA patent at its origin. Right PICA not seen. Basilar grossly patent to its distal aspect without appreciable stenosis. Superior cerebral arteries patent bilaterally. Both PCAs primarily supplied via the basilar. Scattered atheromatous change about the PCAs bilaterally without occlusion, grossly similar to previous. No appreciable aneurysm. IMPRESSION: MRI HEAD IMPRESSION: 1. 1.4 x 2.3 x 1.7 cm well-circumscribed lobulated T1/FLAIR hyperintense lesion involving the left cerebellum, favored to reflect an evolving subacute hematoma, new as compared to prior MRI. No significant regional mass effect. Finding suspected to be hypertensive in nature. 2. Interval expansion and enlargement of previously identified right ACA territory infarct, now moderate in size and acute to subacute in appearance. No significant mass effect. 3. Few additional scattered acute to subacute ischemic nonhemorrhagic infarcts involving the subcortical posterior left frontal region, right periatrial white matter, and splenium of the corpus callosum. 4. Underlying age-related cerebral atrophy with advanced chronic microvascular ischemic disease. 5. Multiple chronic microhemorrhages, predominantly clustered about the thalami, favored to be related to chronic poorly controlled hypertension given distribution. 6. Underlying atrophy with advanced chronic small vessel ischemic disease. MRA HEAD IMPRESSION: 1. Motion degraded exam. 2. Grossly  stable appearance of  the intracranial circulation. No large vessel occlusion. Scattered atheromatous change about the carotid siphons, right ACA, and PCAs. No definite new or progressive finding. Electronically Signed   By: Jeannine Boga M.D.   On: 06/13/2020 23:29   MR BRAIN W WO CONTRAST  Result Date: 06/13/2020 CLINICAL DATA:  Initial evaluation for neuro deficit, stroke suspected. History of recent right ACA territory infarct. EXAM: MRI HEAD WITHOUT AND WITH CONTRAST MRA HEAD WITHOUT CONTRAST TECHNIQUE: Multiplanar, multiecho pulse sequences of the brain and surrounding structures were obtained without and with intravenous contrast. Angiographic images of the head were obtained using MRA technique without contrast. CONTRAST:  6.1mL GADAVIST GADOBUTROL 1 MMOL/ML IV SOLN COMPARISON:  Comparison made with CT from earlier same day as well as previous CTA and MRI from 04/27/2020. FINDINGS: MRI HEAD FINDINGS Brain: Diffuse prominence of the CSF containing spaces compatible with generalized age-related cerebral atrophy. Patchy and confluent T2/FLAIR hyperintensity within the periventricular and deep white matter both cerebral hemispheres most consistent with chronic small vessel ischemic disease, advanced in nature. Patchy involvement of the deep gray nuclei, brainstem, and cerebellum noted as well. Few scattered remote lacunar infarcts present about the hemispheric cerebral white matter and thalami. 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 Associated enhancement seen following contrast administration. No associated hemorrhage or significant mass effect. Few additional scattered foci of restricted diffusion involving the subcortical posterior left frontal region, right periatrial white matter, and splenium of the corpus  callosum also consistent with acute to subacute ischemic infarcts. No associated hemorrhage. These findings are new as compared to prior exam. 1.4 x 2.3 x 1.7 cm well-circumscribed lobulated T1/FLAIR hyperintense lesion seen involving the left cerebellum (series 4, image 7). Lesion demonstrates fairly avid root restricted diffusion. No significant associated enhancement or regional mass effect. Finding favored to reflect an evolving subacute hematoma, new as compared to prior MRI. Finding is difficult to see on prior CT performed earlier the same day. No other findings to suggest acute or subacute intracranial hemorrhage. Multiple additional chronic microhemorrhages noted, predominantly clustered about the thalami, favored to be related to chronic underlying poorly controlled hypertension given distribution. This additional finding suggests that the left cerebellar hematoma may be hypertensive in nature as well. No mass lesion, mass effect, or midline shift. No hydrocephalus or extra-axial fluid collection. No other abnormal enhancement. Pituitary gland and suprasellar region within normal limits. Midline structures intact. Vascular: Major intracranial vascular flow voids are maintained. Skull and upper cervical spine: Craniocervical junction within normal limits. Bone marrow signal intensity normal. No scalp soft tissue abnormality. Sinuses/Orbits: Globes and orbital soft tissues within normal limits. Left maxillary sinus retention cyst. Paranasal sinuses are otherwise clear. No mastoid effusion. Other: None. MRA HEAD FINDINGS ANTERIOR CIRCULATION: Examination moderately degraded by motion artifact. Visualized distal cervical segments of the internal carotid arteries are patent with symmetric antegrade flow. Petrous segments patent bilaterally. Scattered atheromatous change noted throughout the carotid siphons, grossly similar to previous CTA. A1 segments patent bilaterally. Normal anterior communicating artery  complex. Left ACA widely patent. Right A2 segment hypoplastic and diminutive, but remains grossly patent. M1 segments patent bilaterally. Normal MCA bifurcations. Distal MCA branches well perfused and symmetric. POSTERIOR CIRCULATION: Vertebral arteries patent to the vertebrobasilar junction without stenosis. Left PICA patent at its origin. Right PICA not seen. Basilar grossly patent to its  distal aspect without appreciable stenosis. Superior cerebral arteries patent bilaterally. Both PCAs primarily supplied via the basilar. Scattered atheromatous change about the PCAs bilaterally without occlusion, grossly similar to previous. No appreciable aneurysm. IMPRESSION: MRI HEAD IMPRESSION: 1. 1.4 x 2.3 x 1.7 cm well-circumscribed lobulated T1/FLAIR hyperintense lesion involving the left cerebellum, favored to reflect an evolving subacute hematoma, new as compared to prior MRI. No significant regional mass effect. Finding suspected to be hypertensive in nature. 2. Interval expansion and enlargement of previously identified right ACA territory infarct, now moderate in size and acute to subacute in appearance. No significant mass effect. 3. Few additional scattered acute to subacute ischemic nonhemorrhagic infarcts involving the subcortical posterior left frontal region, right periatrial white matter, and splenium of the corpus callosum. 4. Underlying age-related cerebral atrophy with advanced chronic microvascular ischemic disease. 5. Multiple chronic microhemorrhages, predominantly clustered about the thalami, favored to be related to chronic poorly controlled hypertension given distribution. 6. Underlying atrophy with advanced chronic small vessel ischemic disease. MRA HEAD IMPRESSION: 1. Motion degraded exam. 2. Grossly stable appearance of the intracranial circulation. No large vessel occlusion. Scattered atheromatous change about the carotid siphons, right ACA, and PCAs. No definite new or progressive finding.  Electronically Signed   By: Jeannine Boga M.D.   On: 06/13/2020 23:29   ECHOCARDIOGRAM COMPLETE  Result Date: 06/14/2020    ECHOCARDIOGRAM REPORT   Patient Name:   [name] Date of Exam: 06/14/2020 Medical Rec #:  [id number]           Height:       63.0 in Accession #:    [id number]          Weight:       145.0 lb Date of Birth:  1961/06/26           BSA:          1.687 m Patient Age:    40 years            BP:           190/105 mmHg Patient Gender: F                   HR:           75 bpm. Exam Location:  Inpatient Procedure: 2D Echo, Cardiac Doppler and Color Doppler Indications:    Stroke 434.91 / I163.9  History:        Patient has prior history of Echocardiogram examinations, most                 recent 04/27/2020. Stroke; Risk Factors:Hypertension.  Sonographer:    Bernadene Person RDCS Referring Phys: [id number] Cleaster Corin PATEL  Sonographer Comments: patient on right side IMPRESSIONS  1. Intracavitary gradient, peak velocity 2.38 m/s. Peak gradient 22.7 mmHg.Marland Kitchen Left ventricular ejection fraction, by estimation, is 60 to 65%. The left ventricle has normal function. The left ventricle has no regional wall motion abnormalities. There is mild concentric left ventricular hypertrophy. Left ventricular diastolic parameters are consistent with Grade I diastolic dysfunction (impaired relaxation). Elevated left ventricular end-diastolic pressure.  2. Right ventricular systolic function is normal. The right ventricular size is normal. There is normal pulmonary artery systolic pressure.  3. Left atrial size was mildly dilated.  4. The mitral valve is normal in structure. No evidence of mitral valve regurgitation. No evidence of mitral stenosis.  5. The aortic valve is normal in structure. Aortic valve regurgitation is not visualized. No aortic stenosis is present.  6. The inferior vena cava is normal in size with greater than 50% respiratory variability, suggesting right atrial pressure of 3 mmHg. FINDINGS   Left Ventricle: Intracavitary gradient, peak velocity 2.38 m/s. Peak gradient 22.7 mmHg. Left ventricular ejection fraction, by estimation, is 60 to 65%. The left ventricle has normal function. The left ventricle has no regional wall motion abnormalities. The left ventricular internal cavity size was normal in size. There is mild concentric left ventricular hypertrophy. Left ventricular diastolic parameters are consistent with Grade I diastolic dysfunction (impaired relaxation). Elevated left ventricular end-diastolic pressure. Right Ventricle: The right ventricular size is normal. No increase in right ventricular wall thickness. Right ventricular systolic function is normal. There is normal pulmonary artery systolic pressure. The tricuspid regurgitant velocity is 1.24 m/s, and  with an assumed right atrial pressure of 3 mmHg, the estimated right ventricular systolic pressure is 9.2 mmHg. Left Atrium: Left atrial size was mildly dilated. Right Atrium: Right atrial size was normal in size. Pericardium: There is no evidence of pericardial effusion. Mitral Valve: The mitral valve is normal in structure. No evidence of mitral valve regurgitation. No evidence of mitral valve stenosis. Tricuspid Valve: The tricuspid valve is normal in structure. Tricuspid valve regurgitation is trivial. No evidence of tricuspid stenosis. Aortic Valve: The aortic valve is normal in structure. Aortic valve regurgitation is not visualized. No aortic stenosis is present. Pulmonic Valve: The pulmonic valve was normal in structure. Pulmonic valve regurgitation is not visualized. No evidence of pulmonic stenosis. Aorta: The aortic root is normal in size and structure. Venous: The inferior vena cava is normal in size with greater than 50% respiratory variability, suggesting right atrial pressure of 3 mmHg. IAS/Shunts: No atrial level shunt detected by color flow Doppler.  LEFT VENTRICLE PLAX 2D LVIDd:         4.88 cm  Diastology LVIDs:          3.47 cm  LV e' medial:    3.65 cm/s LV PW:         1.23 cm  LV E/e' medial:  15.3 LV IVS:        1.07 cm  LV e' lateral:   3.97 cm/s LVOT diam:     1.90 cm  LV E/e' lateral: 14.1 LV SV:         73 LV SV Index:   44 LVOT Area:     2.84 cm  RIGHT VENTRICLE RV S prime:     15.80 cm/s TAPSE (M-mode): 1.7 cm LEFT ATRIUM             Index       RIGHT ATRIUM           Index LA diam:        3.50 cm 2.08 cm/m  RA Area:     13.20 cm LA Vol (A2C):   59.9 ml 35.51 ml/m RA Volume:   29.70 ml  17.61 ml/m LA Vol (A4C):   40.1 ml 23.78 ml/m LA Biplane Vol: 50.2 ml 29.76 ml/m  AORTIC VALVE LVOT Vmax:   155.00 cm/s LVOT Vmean:  121.000 cm/s LVOT VTI:    0.259 m  AORTA Ao Root diam: 2.70 cm Ao Asc diam:  2.60 cm MITRAL VALVE               TRICUSPID VALVE MV Area (PHT): 2.50 cm    TR Peak grad:   6.2 mmHg MV Decel Time: 303 msec    TR Vmax:  124.00 cm/s MV E velocity: 56.00 cm/s MV A velocity: 74.40 cm/s  SHUNTS MV E/A ratio:  0.75        Systemic VTI:  0.26 m                            Systemic Diam: 1.90 cm Skeet Latch MD Electronically signed by Skeet Latch MD Signature Date/Time: 06/14/2020/1:15:36 PM    Final    ECHO TEE  Result Date: 06/15/2020    TRANSESOPHOGEAL ECHO REPORT   Patient Name:   [name] Date of Exam: 06/15/2020 Medical Rec #:  [id number]           Height:       63.0 in Accession #:    [id number]          Weight:       145.0 lb Date of Birth:  06-Oct-1960           BSA:          1.687 m Patient Age:    60 years            BP:           180/76 mmHg Patient Gender: F                   HR:           78 bpm. Exam Location:  Inpatient Procedure: Cardiac Doppler, Color Doppler and Transesophageal Echo Indications:     Stroke  History:         Patient has prior history of Echocardiogram examinations, most                  recent 06/14/2020. CHF; Risk Factors:Hypertension. H/O CVA. CKD.  Sonographer:     Clayton Lefort RDCS (AE) Referring Phys:  [place] Phys: Rudean Haskell MD PROCEDURE: After discussion of the risks and benefits of a TEE, an informed consent was obtained from the patient. The transesophogeal probe was passed without difficulty through the esophogus of the patient. Local oropharyngeal anesthetic was provided with Cetacaine. Sedation performed by different physician. The patient was monitored while under deep sedation. Anesthestetic sedation was provided intravenously by Anesthesiology: 151.86mg  of Propofol, 100mg  of Lidocaine. Image quality was good. The patient developed no complications during the procedure. IMPRESSIONS  1. Left ventricular ejection fraction, by estimation, is 60 to 65%. The left ventricle has normal function. The left ventricle has no regional wall motion abnormalities. Left ventricular diastolic function could not be evaluated.  2. Right ventricular systolic function is normal. The right ventricular size is normal.  3. Left atrial size was mildly dilated. No left atrial/left atrial appendage thrombus was detected.  4. The mitral valve is normal in structure. Mild mitral valve regurgitation.  5. The aortic valve is tricuspid. Aortic valve regurgitation is not visualized.  6. There is mild (Grade II) plaque.  7. Agitated saline contrast bubble study was negative, with no evidence of any interatrial shunt. Conclusion(s)/Recommendation(s): No Evidence of intracardiac thrombus. There is some evidence of concentric hypertrophy in the mid and apex without apical aneurysm. Consider outpatient, TTE with contrast, in follow up if clinically indicated. FINDINGS  Left Ventricle: There is evidence of midventricular and apical hypertrophy, best observed in deep gastric imaging. No denfinitive evidence for apical hyperrophy (HCM Variant). Left ventricular ejection fraction, by estimation, is 60 to 65%. The left ventricle has normal function. The left  ventricle has no regional wall motion abnormalities. The left ventricular internal cavity size was  normal in size. Left ventricular diastolic function could not be evaluated. Right Ventricle: The right ventricular size is normal. No increase in right ventricular wall thickness. Right ventricular systolic function is normal. Left Atrium: Left atrial size was mildly dilated. No left atrial/left atrial appendage thrombus was detected. Right Atrium: Prominent Eustachain ridge. Right atrial size was normal in size. Pericardium: Trivial pericardial effusion is present. Mitral Valve: The mitral valve is normal in structure. Mild mitral valve regurgitation. Tricuspid Valve: The tricuspid valve is normal in structure. Tricuspid valve regurgitation is trivial. Aortic Valve: The aortic valve is tricuspid. Aortic valve regurgitation is not visualized. Pulmonic Valve: The pulmonic valve was grossly normal. Pulmonic valve regurgitation is not visualized. Aorta: The aortic root and ascending aorta are structurally normal, with no evidence of dilitation. There is mild (Grade II) plaque. IAS/Shunts: No atrial level shunt detected by color flow Doppler. Agitated saline contrast was given intravenously to evaluate for intracardiac shunting. Agitated saline contrast bubble study was negative, with no evidence of any interatrial shunt.  LEFT VENTRICLE PLAX 2D LVOT diam:     2.10 cm LVOT Area:     3.46 cm   AORTA Ao Root diam: 2.80 cm Ao Asc diam:  2.90 cm  SHUNTS Systemic Diam: 2.10 cm Rudean Haskell MD Electronically signed by Rudean Haskell MD Signature Date/Time: 06/15/2020/1:18:01 PM    Final    CT HEAD CODE STROKE WO CONTRAST  Result Date: 06/13/2020 CLINICAL DATA:  Code stroke. Initial evaluation for acute left facial droop, slurred speech. Patient has a history of a recent right ACA territory infarct. EXAM: CT HEAD WITHOUT CONTRAST TECHNIQUE: Contiguous axial images were obtained from the base of the skull through the vertex without intravenous contrast. COMPARISON:  Prior CT from 09/19/2015. Additionally,  there is a previous brain MRI from 04/27/2020, reviewed in epic, but not available for review in PACS. FINDINGS: Brain: Generalized age-related cerebral atrophy. Patchy and confluent hypodensity involving the periventricular deep white matter of both cerebral hemispheres most consistent with chronic small vessel ischemic disease. Patchy involvement of the deep gray nuclei, brainstem, and cerebellum noted as well. Appearance is fairly advanced in nature. Few scattered remote lacunar infarcts noted about the bilateral thalami. There is cortical and subcortical hypodensity involving the parasagittal right frontoparietal region, consistent with right ACA territory ischemia. Finding corresponds with previously identified right ACA territory infarct. However, appearance is somewhat more prominent on current exam as compared to previous brain MRI, raising the possibility for possible interval expansion and/or new acute component. Punctate hyperdensity at the posterior aspect of this area of ischemia suspicious for a small focus of associated petechial hemorrhage (series 3, image 23). No frank hemorrhagic transformation or mass effect. No other evidence for acute large vessel territory infarct. Gray-white matter differentiation otherwise grossly maintained. No mass lesion or midline shift. No hydrocephalus or extra-axial fluid collection. Vascular: No hyperdense vessel. Scattered vascular calcifications noted within the carotid siphons. Skull: Scalp soft tissues and calvarium within normal limits. Sinuses/Orbits: Globes and orbital soft tissues demonstrate no acute finding. Left maxillary sinus retention cyst noted. Paranasal sinuses are otherwise largely clear. No mastoid effusion. Other: Poor dentition noted. ASPECTS [facility] Stroke Program Early CT Score) - Ganglionic level infarction (caudate, lentiform nuclei, internal capsule, insula, M1-M3 cortex): 7 - Supraganglionic infarction (M4-M6 cortex): 3 Total score (0-10  with 10 being normal): 10 IMPRESSION: 1. Cortical and subcortical hypodensity involving the parasagittal right frontoparietal  region, consistent with previously identified right ACA territory infarct. Appearance is somewhat more prominent on current exam as compared to previous brain MRI, raising the possibility for possible interval expansion and/or new acute component. Punctate hyperdensity at the posterior aspect of this area of ischemia suspicious for a small focus of associated petechial hemorrhage. No frank hemorrhagic transformation or mass effect. 2. ASPECTS is 10. 3. 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 These results were communicated to Dr. Rory Percy at 9:35 pmon 10/4/2021by text page via the [facility] messaging system. Findings also discussed by telephone with Dr. Rory Percy at time of dictation. Electronically Signed   By: Jeannine Boga M.D.   On: 06/13/2020 21:49   VAS [place] LOWER EXTREMITY VENOUS (DVT)  Result Date: 06/14/2020  Lower Venous DVTStudy Indications: Stroke.  Comparison Study: no prior Performing Technologist: Abram Sander RVS  Examination Guidelines: A complete evaluation includes B-mode imaging, spectral Doppler, color Doppler, and power Doppler as needed of all accessible portions of each vessel. Bilateral testing is considered an integral part of a complete examination. Limited examinations for reoccurring indications may be performed as noted. The reflux portion of the exam is performed with the patient in reverse Trendelenburg.  +---------+---------------+---------+-----------+----------+--------------+  RIGHT     Compressibility Phasicity Spontaneity Properties Thrombus Aging  +---------+---------------+---------+-----------+----------+--------------+  CFV       Full            Yes       Yes                                    +---------+---------------+---------+-----------+----------+--------------+  SFJ       Full                                                              +---------+---------------+---------+-----------+----------+--------------+  FV Prox   Full                                                             +---------+---------------+---------+-----------+----------+--------------+  FV Mid    Full                                                             +---------+---------------+---------+-----------+----------+--------------+  FV Distal Full                                                             +---------+---------------+---------+-----------+----------+--------------+  PFV       Full                                                             +---------+---------------+---------+-----------+----------+--------------+  POP       Full            Yes       Yes                                    +---------+---------------+---------+-----------+----------+--------------+  PTV       Full                                                             +---------+---------------+---------+-----------+----------+--------------+  PERO      Full                                                             +---------+---------------+---------+-----------+----------+--------------+   +---------+---------------+---------+-----------+----------+--------------+  LEFT      Compressibility Phasicity Spontaneity Properties Thrombus Aging  +---------+---------------+---------+-----------+----------+--------------+  CFV       Full            Yes       Yes                                    +---------+---------------+---------+-----------+----------+--------------+  SFJ       Full                                                             +---------+---------------+---------+-----------+----------+--------------+  FV Prox   Full                                                             +---------+---------------+---------+-----------+----------+--------------+  FV Mid    Full                                                              +---------+---------------+---------+-----------+----------+--------------+  FV Distal Full                                                             +---------+---------------+---------+-----------+----------+--------------+  PFV       Full                                                             +---------+---------------+---------+-----------+----------+--------------+  POP       Full            Yes       Yes                                    +---------+---------------+---------+-----------+----------+--------------+  PTV       Full                                                             +---------+---------------+---------+-----------+----------+--------------+  PERO      Full                                                             +---------+---------------+---------+-----------+----------+--------------+     Summary: BILATERAL: - No evidence of deep vein thrombosis seen in the lower extremities, bilaterally. - No evidence of superficial venous thrombosis in the lower extremities, bilaterally. -   *See table(s) above for measurements and observations. Electronically signed by Servando Snare MD on 06/14/2020 at 12:16:46 PM.    Final    Scheduled Meds:   stroke: mapping our early stages of recovery book   Does not apply Once   amLODipine  10 mg Oral Daily   aspirin EC  81 mg Oral Daily   atorvastatin  40 mg Oral Daily   isosorbide mononitrate  60 mg Oral Daily   lidocaine  5 mL Intradermal Once   metoprolol tartrate  25 mg Oral BID   Continuous Infusions:  sodium chloride 50 mL/hr at 06/15/20 1156   potassium chloride 10 mEq (06/15/20 1539)     LOS: 2 days   Kerney Elbe, DO [facility] PAGER is on AMION  If 7PM-7AM, please contact night-coverage [email or web address]

## 2020-06-15 NOTE — Progress Notes (Signed)
Occupational Therapy Evaluation Patient Details Name: [name] MRN: [id number] DOB: 01/11/1961 Today's Date: 06/15/2020  Clinical Impression: Pt from home, recent CVA in August 2021 and has been max A/total A for ADL and mobility since leaving CIR. LUE flaccid. Performed PROM and opened/washed hand with optimal positioning at the end of session. Pt reports having resting hand splints. Today Pt is pleasant, participated with max A in bed level grooming with oral care, and washing face. She was max A/total for rolling for rear peri care. Pt, while pleasant is unreliable historian often repeating phrases and no family present at the time of evaluation. At this time recommending Palliative consult (or [place] meeting with family) in addition to SNF post-acute. OT will continue to follow acutely and next session to focus on grooming, eating, and seated position for functional tasks.     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 MRI reveals expansion of acute vs subacute R ACA CVA, new embolic acute vs subacute CVAs in bilateral hemispheres, and chronic-appearing L cerebellar ICH either due to hemorrhage or conversion of ischemic CVA (new since 04/2020).  Precautions  Precautions Fall  Precaution Comments L hemiplegia, L inattention, pushing tendency with RUE to weak L side  Restrictions  Weight Bearing Restrictions No  Home Living  Family/patient expects to be discharged to: Private residence  Living Arrangements Spouse/significant other;Children (daughter and fiance there 24/7)  Available Help at Discharge Family;Available 24 hours/day;Other (Comment)  Type of [place] to enter  Entrance Stairs-Number of Steps 3-4 steps (family carries her or she is lifted)  Entrance  Stairs-Rails Right;Left;Can reach both  Home Layout One level  Teacher, English as a foreign language Yes  How Accessible Accessible via walker  Home Equipment BSC;Wheelchair - Education administrator (comment)  Additional Comments hoyer lift, chair lift   Lives With Son;Spouse  Prior Function  Level of Independence Needs assistance  Gait / Transfers Assistance Needed pt reports transfer with hoyer lift  ADL's / Homemaking Assistance Needed Pt needing assist for dressing, bathing, eating; total care. Pt's daughter is a CNA per pt report  Communication  Communication Expressive difficulties  Pain Assessment  Pain Assessment Faces  Faces Pain Scale 4  Pain Location L arm, leg  Pain Descriptors / Indicators Sore;Grimacing  Pain Intervention(s) Limited activity within patient's tolerance;Monitored during session;Repositioned  Cognition  Arousal/Alertness Awake/alert  Behavior During Therapy WFL for tasks assessed/performed  Overall Cognitive Status No family/caregiver present to determine baseline cognitive functioning  Area of Impairment Orientation;Attention;Memory;Following commands;Problem solving  Orientation Level Disoriented to;Place;Situation  Current Attention Level Sustained  Memory Decreased short-term memory  Following Commands Follows one step commands consistently;Follows one step commands with increased time  Problem Solving Slow processing;Decreased initiation;Difficulty sequencing;Requires verbal cues;Requires tactile cues  General Comments Pt knew it was October, but did not know she was at the hospital, follwoing commands slowly and has a few phrases that she repeats over and over. pleasant, happy.  Upper Extremity Assessment  Upper Extremity Assessment LUE deficits/detail  LUE Deficits / Details 0/5 - no active movement LUE from previous CVA 8/21, opened up hand and Pt states that she does have resting hand splints at home  Lower  Extremity Assessment  Lower Extremity Assessment  Defer to PT evaluation  ADL  Overall ADL's  Needs assistance/impaired  Eating/Feeding Maximal assistance  Grooming Wash/dry face;Oral care;Moderate assistance;Bed level (HOB elevated)  Grooming Details (indicate cue type and reason) Pt was able to brush her teeth and wash face with mod A (pre-loaded toothbrush, cues)  Upper Body Bathing Maximal assistance  Lower Body Bathing Total assistance  Upper Body Dressing  Maximal assistance  Lower Body Dressing Total assistance  Toilet Transfer Total assistance  Toileting- Clothing Manipulation and Hygiene Total assistance  Functional mobility during ADLs Maximal assistance;Total assistance;+2 for physical assistance;+2 for safety/equipment  Vision- History  Baseline Vision/History Wears glasses  Vision- Assessment  Vision Assessment? Vision impaired- to be further tested in functional context  Additional Comments Pt with visual deficits - unsure if progressed since last CVA  Perception  Comments inattention to left side and body  Bed Mobility  Overal bed mobility Needs Assistance  Bed Mobility Rolling  Rolling Max assist  General bed mobility comments Rolling to check for BM  Transfers  General transfer comment NT  OT - End of Session  Activity Tolerance Patient tolerated treatment well  Patient left in bed;with call bell/phone within reach;with bed alarm set  Nurse Communication Mobility status  OT Assessment  OT Recommendation/Assessment Patient needs continued OT Services  OT Visit Diagnosis Other abnormalities of gait and mobility (R26.89);Other symptoms and signs involving cognitive function;Hemiplegia and hemiparesis  Hemiplegia - Right/Left Left  Hemiplegia - dominant/non-dominant Non-Dominant  Hemiplegia - caused by Cerebral infarction  OT Problem List Decreased strength;Decreased range of motion;Decreased activity tolerance;Impaired balance (sitting and/or standing);Impaired  vision/perception;Decreased coordination;Decreased cognition;Decreased safety awareness;Decreased knowledge of use of DME or AE;Decreased knowledge of precautions;Impaired sensation;Impaired tone;Obesity;Impaired UE functional use  OT Plan  OT Frequency (ACUTE ONLY) Min 2X/week  OT Treatment/Interventions (ACUTE ONLY) Self-care/ADL training;Neuromuscular education;DME and/or AE instruction;Therapeutic activities;Cognitive remediation/compensation;Visual/perceptual remediation/compensation;Patient/family education;Balance training;Manual therapy  AM-PAC OT "6 Clicks" Daily Activity Outcome Measure (Version 2)  Help from another person eating meals? 2  Help from another person taking care of personal grooming? 2  Help from another person toileting, which includes using toliet, bedpan, or urinal? 1  Help from another person bathing (including washing, rinsing, drying)? 2  Help from another person to put on and taking off regular upper body clothing? 2  Help from another person to put on and taking off regular lower body clothing? 1  6 Click Score 10  OT Recommendation  Recommendations for Other Services Other (comment) (Palliative Consult)  Follow Up Recommendations SNF  OT Equipment None recommended by OT (Pt has appropriate DME at home)  Individuals Consulted  Consulted and Agree with Results and Recommendations Patient  Family Member Consulted no family present during session today  Acute Rehab OT Goals  Patient Stated Goal none stated  OT Time Calculation  OT Start Time (ACUTE ONLY) [phone]  OT Stop Time (ACUTE ONLY) 0906  OT Time Calculation (min) 28 min  OT General Charges  $OT Visit 1 Visit  OT Evaluation  $OT Eval Moderate Complexity 1 Mod  OT Treatments  $Self Care/Home Management  8-22 mins  Written Expression  Dominant Hand Right   Jesse Sans OTR/L Acute Rehabilitation Services Pager: [phone] Office: [phone]

## 2020-06-15 NOTE — Evaluation (Cosign Needed)
Speech Language Pathology Evaluation Patient Details Name: [name] MRN: [id number] DOB: 1960/10/30 Today's Date: 06/15/2020 Time: 5329-9242 SLP Time Calculation (min) (ACUTE ONLY): 21 min  Problem List:  Patient Active Problem List   Diagnosis Date Noted  . CKD (chronic kidney disease) stage 3, GFR 30-59 ml/min (HCC)   . Labile blood pressure   . Slow transit constipation   . Acute blood loss anemia   . Left inguinal pain   . Pain   . Spastic hemiparesis ([employer])   . Dysphagia, post-stroke   . Pressure injury of skin 05/04/2020  . Thrombocytosis   . Leukocytosis   . AKI (acute kidney injury) ([place])   . Benign essential HTN   . Lethargy   . CVA (cerebral vascular accident) ([place]) 05/03/2020  . Chronic combined systolic (congestive) and diastolic (congestive) heart failure ([place]) 05/02/2020  . Hypertensive emergency   . Slurred speech   . Essential hypertension   . Acute combined systolic (congestive) and diastolic (congestive) heart failure ([place])   . Thrombocytopenia ([place])   . Stage 3b chronic kidney disease ([place])   . Prediabetes   . Acute CVA (cerebrovascular accident) ([place]) 04/27/2020  . Accelerated hypertension 04/27/2020  . Renal dysfunction 04/27/2020  . Hypokalemia 04/27/2020  . HTN (hypertension) 10/26/2015  . Anemia, iron deficiency 10/26/2015  . Visit for preventive health examination 10/26/2015  . Osteoarthritis of both knees    Past Medical History:  Past Medical History:  Diagnosis Date  . Arthritis   . CKD (chronic kidney disease) stage 3, GFR 30-59 ml/min (HCC)   . CVA (cerebral vascular accident) ([place])   . Hypertension   . Hypertension    not taking medications   Past Surgical History:  Past Surgical History:  Procedure Laterality Date  . CESAREAN SECTION     HPI:  [name] is a 59 y.o. female with medical history significant for uncontrolled hypertension, CKD stage III, and recent stroke on 04/27/2020 involving right ACA  territory (with residual left-sided hemiparesis and dysarthria). Admitted to ED with left facial droop and slurred speech. MRI brain and MRA head without contrast showed an evolving subacute hematoma involving the left cerebellum which is new compared to prior MRI.  Interval expansion and enlargement of previously identified right ACA territory infarct seen, now moderate in size and acute to subacute in appearance.  Scattered acute to subacute ischemic nonhemorrhagic infarcts involving the subcortical posterior left frontal region, right parietal white matter, and splenium of the corpus callosum also seen.  Multiple chronic microhemorrhages, predominantly clustered about the thalami identified.  In prior admission to CIR in 9/21 pt demonstraed mild to moderate oral dysphagia with prolonged oral transit, requiring dys 2 solids and thin liquids at d/c. Daughter at bedside reports this is still her baseline diet and pt is a picky eater.    Assessment / Plan / Recommendation Clinical Impression  Pt was seen for SLE and was alert and cooperative. She was oriented to self and time. She scored 19/26 on the SLUMS, indicating cognitive impairment in the areas of memory, attention, awareness, and executive function. Her memory impairment is characterized by decreased recall of new information and a retrieval deficit. Throughout the eval she demonstrated difficulty sustaining attention to tasks. She often required verbal cues from the SLP to reiterate the directions of the task before completing it. Her emergent awareness was also observed to be impaired. Difficulty with executive functions was observed in the areas of sequencing and decision-making. She acheived 100%  on an auditory comprehension tasks.  During a spontaneous speech task, she demonstrated great difficulty describing a picture accurately. Question whether vision impacted her performance. Her intelligibility was slightly reduced at times as speech sounded  mildly dysarthric. SLP will f/u acutely to address impairments.     SLP Assessment  SLP Recommendation/Assessment: Patient needs continued Speech Lanaguage Pathology Services SLP Visit Diagnosis: Dysphagia, unspecified (R13.10);Cognitive communication deficit (R41.841)    Follow Up Recommendations  Skilled Nursing facility    Frequency and Duration min 2x/week  2 weeks      SLP Evaluation Cognition  Overall Cognitive Status: History of cognitive impairments - at baseline Arousal/Alertness: Lethargic Orientation Level: Oriented to person;Oriented to place;Oriented to time Attention: Focused;Sustained Focused Attention: Impaired Focused Attention Impairment: Verbal basic Sustained Attention: Impaired Sustained Attention Impairment: Verbal basic Memory: Impaired Memory Impairment: Retrieval deficit;Decreased recall of new information Decreased Short Term Memory: Verbal basic Awareness: Impaired Awareness Impairment: Emergent impairment Problem Solving: Impaired Problem Solving Impairment: Verbal basic Executive Function: Sequencing Sequencing: Impaired Decision Making: Impaired Decision Making Impairment: Verbal basic       Comprehension  Auditory Comprehension Overall Auditory Comprehension: Impaired Yes/No Questions: Within Functional Limits Commands: Impaired Visual Recognition/Discrimination Discrimination: Not tested Reading Comprehension Reading Status: Not tested    Expression Expression Primary Mode of Expression: Verbal Verbal Expression Overall Verbal Expression: Impaired Initiation: No impairment Automatic Speech: Name;Social Response;Day of week;Month of year Level of Generative/Spontaneous Verbalization: Sentence Naming: Impairment Confrontation: Impaired Convergent: 75-100% accurate Written Expression Dominant Hand: Right Written Expression: Exceptions to [place] [facility] Dictation Ability: Phrase   Oral / Motor  Oral Motor/Sensory Function Overall Oral  Motor/Sensory Function: Within functional limits Motor Speech Respiration: Within functional limits Phonation: Normal Resonance: Within functional limits Articulation: Impaired Level of Impairment: Conversation Intelligibility: Intelligibility reduced Word: 75-100% accurate Phrase: 75-100% accurate Sentence: 75-100% accurate Conversation: 75-100% accurate Motor Planning: Not tested   GO                    Greggory Keen 06/15/2020, 3:06 PM

## 2020-06-15 NOTE — Progress Notes (Signed)
Pt transported off unit to endo for procedure. Delia Heady RN

## 2020-06-15 NOTE — CV Procedure (Signed)
    TRANSESOPHAGEAL ECHOCARDIOGRAM   NAME:  [name]    MRN: [id number] DOB:  11/04/1960    ADMIT DATE: 06/13/2020  INDICATIONS: Concern for embolic stroke  PROCEDURE:   Informed consent was obtained prior to the procedure. The risks, benefits and alternatives for the procedure were discussed and the patient comprehended these risks.  Risks include, but are not limited to, cough, sore throat, vomiting, nausea, somnolence, esophageal and stomach trauma or perforation, bleeding, low blood pressure, aspiration, pneumonia, infection, trauma to the teeth and death.    Procedural time out performed. The oropharynx was anesthetized with topical 1% cetacaine.    Anesthesia was administered by CRNA Lillie Fragmin.  The patient was administered a total of propofol 150 mg and Lidocaine 100 mg to achieve and maintain moderate conscious sedation.  The patient's heart rate, blood pressure, and oxygen saturation are monitored continuously during the procedure. The period of conscious sedation is 17 minutes, of which I was present face-to-face 100% of this time.   The transesophageal probe was inserted in the esophagus and stomach without difficulty and multiple views were obtained.   COMPLICATIONS:    There were no immediate complications.  KEY FINDINGS:  1. No evidence of LV or LAA Thrombus, negative bubble study.  Ventricular hypertrophy noted. 2. Full report to follow. 3. Further management per primary team.   Rudean Haskell, MD [place]  12:18 PM

## 2020-06-15 NOTE — Anesthesia Preprocedure Evaluation (Signed)
Anesthesia Evaluation  Patient identified by MRN, date of birth, ID band Patient awake    Reviewed: Allergy & Precautions, NPO status , Patient's Chart, lab work & pertinent test results, reviewed documented beta blocker date and time   Airway Mallampati: II  TM Distance: >3 FB Neck ROM: Full    Dental  (+) Dental Advisory Given, Upper Dentures, Loose,    Pulmonary neg pulmonary ROS,    Pulmonary exam normal breath sounds clear to auscultation       Cardiovascular hypertension, Pt. on home beta blockers and Pt. on medications +CHF  Normal cardiovascular exam Rhythm:Regular Rate:Normal     Neuro/Psych CVA, Residual Symptoms    GI/Hepatic negative GI ROS, Neg liver ROS,   Endo/Other  negative endocrine ROS  Renal/GU Renal InsufficiencyRenal disease     Musculoskeletal  (+) Arthritis ,   Abdominal   Peds  Hematology  (+) Blood dyscrasia, anemia ,   Anesthesia Other Findings Day of surgery medications reviewed with the patient.  Reproductive/Obstetrics                             Anesthesia Physical Anesthesia Plan  ASA: III  Anesthesia Plan: MAC   Post-op Pain Management:    Induction: Intravenous  PONV Risk Score and Plan: 2 and Propofol infusion and Treatment may vary due to age or medical condition  Airway Management Planned: Natural Airway and Nasal Cannula  Additional Equipment:   Intra-op Plan:   Post-operative Plan:   Informed Consent: I have reviewed the patients History and Physical, chart, labs and discussed the procedure including the risks, benefits and alternatives for the proposed anesthesia with the patient or authorized representative who has indicated his/her understanding and acceptance.       Plan Discussed with: CRNA  Anesthesia Plan Comments:         Anesthesia Quick Evaluation

## 2020-06-15 NOTE — Procedures (Signed)
Procedure Note: Lumbar puncture  Indications: assess for CNS pathology   Operator: Rosalin Hawking, MD, PhD  Others present: Lannette Donath, RN  Indications, risks, and benefits explained to patient / surrogate decision maker and informed consent obtained. Time-out was performed, with all individuals present agreeing on the procedure to be performed, the site of procedure, and the patient identity.  Patient positioned, prepped and draped in usual sterile fashion. Pt has scar tissue at midline L spine, not able to feel spinal precess. Only vaguely felt L4-5 and L5-S1. 1% Lidocaine without epinephrine was used to anesthetize the area. An 20G spinal needle was introduced into the sub- arachnoid space. Attempted 3 times at L4-5 and one time at L5-S1, no appropriate fluid return. Needle removed and procedure aborted. No complications were observed. Spontaneous movement of bilateral extremities were observed after the procedure.  Will call fluoro guided LP to be performed in am.   Rosalin Hawking, MD PhD Stroke Neurology 06/15/2020 6:57 PM

## 2020-06-15 NOTE — Interval H&P Note (Signed)
History and Physical Interval Note:  06/15/2020 11:50 AM  [name]  has presented today for surgery, with the diagnosis of STROKE.  The various methods of treatment have been discussed with the patient and family. After consideration of risks, benefits and other options for treatment, the patient has consented to  Procedure(s): TRANSESOPHAGEAL ECHOCARDIOGRAM (TEE) (N/A) as a surgical intervention.  The patient's history has been reviewed, patient examined, no change in status, stable for surgery.  I have reviewed the patient's chart and labs.  Questions were answered to the patient's satisfaction.     Damia Bobrowski A Violett Hobbs

## 2020-06-15 NOTE — Evaluation (Signed)
Clinical/Bedside Swallow Evaluation Patient Details  Name: [name] MRN: [id number] Date of Birth: 1961/07/24  Today's Date: 06/15/2020 Time: SLP Start Time (ACUTE ONLY): 1310 SLP Stop Time (ACUTE ONLY): 1331 SLP Time Calculation (min) (ACUTE ONLY): 21 min  Past Medical History:  Past Medical History:  Diagnosis Date  . Arthritis   . CKD (chronic kidney disease) stage 3, GFR 30-59 ml/min (HCC)   . CVA (cerebral vascular accident) ([place])   . Hypertension   . Hypertension    not taking medications   Past Surgical History:  Past Surgical History:  Procedure Laterality Date  . CESAREAN SECTION     HPI:  [name] is a 59 y.o. female with medical history significant for uncontrolled hypertension, CKD stage III, and recent stroke on 04/27/2020 involving right ACA territory (with residual left-sided hemiparesis and dysarthria). Admitted to ED with left facial droop and slurred speech. MRI brain and MRA head without contrast showed an evolving subacute hematoma involving the left cerebellum which is new compared to prior MRI.  Interval expansion and enlargement of previously identified right ACA territory infarct seen, now moderate in size and acute to subacute in appearance.  Scattered acute to subacute ischemic nonhemorrhagic infarcts involving the subcortical posterior left frontal region, right parietal white matter, and splenium of the corpus callosum also seen.  Multiple chronic microhemorrhages, predominantly clustered about the thalami identified.  In prior admission to CIR in 9/21 pt demonstraed mild to moderate oral dysphagia with prolonged oral transit, requiring dys 2 solids and thin liquids at d/c. Daughter at bedside reports this is still her baseline diet and pt is a picky eater.    Assessment / Plan / Recommendation Clinical Impression  Pt demosntrates a mild baseline dysphagia that has not worsened with acute CVA. Pt typically prefers dys 2 solids at home,  particularly well chopped [place] and fruit. She avoids dry crunchy foods as they give her mild globus, which was demonstrated today with some throat clearing following cracker. Pt otherwise tolerated puree and thin liquids well. Will resume Dys 2/thin diet and sign off for swallowing.  SLP Visit Diagnosis: Dysphagia, unspecified (R13.10)    Aspiration Risk  Mild aspiration risk    Diet Recommendation Dysphagia 2 (Fine chop);Thin liquid   Liquid Administration via: Cup;Straw Medication Administration: Crushed with puree Supervision: Staff to assist with self feeding Compensations: Slow rate;Small sips/bites Postural Changes: Seated upright at 90 degrees    Other  Recommendations Oral Care Recommendations: Oral care BID Other Recommendations: Have oral suction available   Follow up Recommendations Skilled Nursing facility      Frequency and Duration            Prognosis        Swallow Study   General HPI: [name] is a 59 y.o. female with medical history significant for uncontrolled hypertension, CKD stage III, and recent stroke on 04/27/2020 involving right ACA territory (with residual left-sided hemiparesis and dysarthria). Admitted to ED with left facial droop and slurred speech. MRI brain and MRA head without contrast showed an evolving subacute hematoma involving the left cerebellum which is new compared to prior MRI.  Interval expansion and enlargement of previously identified right ACA territory infarct seen, now moderate in size and acute to subacute in appearance.  Scattered acute to subacute ischemic nonhemorrhagic infarcts involving the subcortical posterior left frontal region, right parietal white matter, and splenium of the corpus callosum also seen.  Multiple chronic microhemorrhages, predominantly clustered about the thalami  identified.  In prior admission to CIR in 9/21 pt demonstraed mild to moderate oral dysphagia with prolonged oral transit, requiring dys 2  solids and thin liquids at d/c. Daughter at bedside reports this is still her baseline diet and pt is a picky eater.  Type of Study: Bedside Swallow Evaluation Diet Prior to this Study: NPO Temperature Spikes Noted: No Respiratory Status: Room air History of Recent Intubation: No Behavior/Cognition: Alert;Cooperative;Pleasant mood Oral Cavity Assessment: Within Functional Limits Oral Cavity - Dentition: Missing dentition Vision: Functional for self-feeding Self-Feeding Abilities: Able to feed self Patient Positioning: Upright in bed Baseline Vocal Quality: Normal Volitional Cough: Cognitively unable to elicit Volitional Swallow: Unable to elicit    Oral/Motor/Sensory Function Overall Oral Motor/Sensory Function: Within functional limits   Ice Chips Ice chips: Within functional limits   Thin Liquid Thin Liquid: Within functional limits Presentation: Cup;Straw    Nectar Thick Nectar Thick Liquid: Not tested   Honey Thick Honey Thick Liquid: Not tested   Puree Puree: Within functional limits Presentation: Spoon   Solid     Solid: Impaired Pharyngeal Phase Impairments: Throat Clearing - Delayed     Herbie Baltimore, MA CCC-SLP  Acute Rehabilitation Services Pager [phone] Office [phone]  [name], [name] 06/15/2020,2:26 PM

## 2020-06-15 NOTE — NC FL2 (Signed)
[place] LEVEL OF CARE SCREENING TOOL     IDENTIFICATION  Patient Name: [name] Birthdate: Jan 28, 1961 Sex: female Admission Date (Current Location): 06/13/2020  [facility] and [place] Number:  Herbalist and Address:  [facility], [place] [address], [place], [place] [address]      Provider Number: [id number]  Attending Physician Name and Address:  Kerney Elbe, DO  Relative Name and Phone Number:       Current Level of Care: Hospital Recommended Level of Care: [place] Prior Approval Number:    Date Approved/Denied:   PASRR Number: [id number]  Discharge Plan: SNF    Current Diagnoses: Patient Active Problem List   Diagnosis Date Noted  . CKD (chronic kidney disease) stage 3, GFR 30-59 ml/min (HCC)   . Labile blood pressure   . Slow transit constipation   . Acute blood loss anemia   . Left inguinal pain   . Pain   . Spastic hemiparesis ([place])   . Dysphagia, post-stroke   . Pressure injury of skin 05/04/2020  . Thrombocytosis   . Leukocytosis   . AKI (acute kidney injury) ([place])   . Benign essential HTN   . Lethargy   . CVA (cerebral vascular accident) ([place]) 05/03/2020  . Chronic combined systolic (congestive) and diastolic (congestive) heart failure ([place]) 05/02/2020  . Hypertensive emergency   . Slurred speech   . Essential hypertension   . Acute combined systolic (congestive) and diastolic (congestive) heart failure ([place])   . Thrombocytopenia ([place])   . Stage 3b chronic kidney disease ([place])   . Prediabetes   . Acute CVA (cerebrovascular accident) ([place]) 04/27/2020  . Accelerated hypertension 04/27/2020  . Renal dysfunction 04/27/2020  . Hypokalemia 04/27/2020  . HTN (hypertension) 10/26/2015  . Anemia, iron deficiency 10/26/2015  . Visit for preventive health examination 10/26/2015  . Osteoarthritis of both knees     Orientation RESPIRATION BLADDER Height & Weight     Self,  Time, Situation, Place  Normal Incontinent Weight:   Height:     BEHAVIORAL SYMPTOMS/MOOD NEUROLOGICAL BOWEL NUTRITION STATUS      Incontinent Diet (see DC summary)  AMBULATORY STATUS COMMUNICATION OF NEEDS Skin   Extensive Assist Verbally Normal                       Personal Care Assistance Level of Assistance  Bathing, Feeding, Dressing Bathing Assistance: Maximum assistance Feeding assistance: Limited assistance Dressing Assistance: Maximum assistance     Functional Limitations Info  Sight Sight Info: Impaired        SPECIAL CARE FACTORS FREQUENCY  PT (By licensed PT), OT (By licensed OT)     PT Frequency: 5x/wk OT Frequency: 5x/wk            Contractures Contractures Info: Not present    Additional Factors Info  Code Status, Allergies Code Status Info: Full Allergies Info: NKA           Current Medications (06/15/2020):  This is the current hospital active medication list Current Facility-Administered Medications  Medication Dose Route Frequency Provider Last Rate Last Admin  .  stroke: mapping our early stages of recovery book   Does not apply Once Zada Finders R, MD      . 0.9 %  sodium chloride infusion   Intravenous Continuous Rosalin Hawking, MD 50 mL/hr at 06/15/20 1156 New Bag at 06/15/20 1218  . acetaminophen (TYLENOL) tablet 650 mg  650 mg Oral Q4H PRN Lenore Cordia, MD       Or  . acetaminophen (TYLENOL) 160 MG/5ML solution 650 mg  650 mg Per Tube Q4H PRN Lenore Cordia, MD       Or  . acetaminophen (TYLENOL) suppository 650 mg  650 mg Rectal Q4H PRN Zada Finders R, MD      . amLODipine (NORVASC) tablet 10 mg  10 mg Oral Daily Shelly Coss, MD   10 mg at 06/15/20 1321  . aspirin EC tablet 81 mg  81 mg Oral Daily Rosalin Hawking, MD   81 mg at 06/15/20 1321  . atorvastatin (LIPITOR) tablet 40 mg  40 mg Oral Daily Zada Finders R, MD   40 mg at 06/15/20 1320  . isosorbide mononitrate (IMDUR) 24 hr tablet 60 mg  60 mg Oral Daily Shelly Coss, MD   60 mg at 06/15/20 1320  . labetalol (NORMODYNE) injection 10 mg  10 mg Intravenous Q2H PRN Shelly Coss, MD   10 mg at 06/15/20 1126  . metoprolol tartrate (LOPRESSOR) tablet 25 mg  25 mg Oral BID Shelly Coss, MD   25 mg at 06/15/20 0853  . potassium chloride 10 mEq in 100 mL IVPB  10 mEq Intravenous Q1 Hr x 2 Raiford Noble [place], DO 100 mL/hr at 06/15/20 1539 10 mEq at 06/15/20 1539  . senna-docusate (Senokot-S) tablet 1 tablet  1 tablet Oral QHS PRN Lenore Cordia, MD         Discharge Medications: Please see discharge summary for a list of discharge medications.  Relevant Imaging Results:  Relevant Lab Results:   Additional Information SS#: [id number]  Geralynn Ochs, LCSW

## 2020-06-15 NOTE — Progress Notes (Signed)
STROKE TEAM PROGRESS NOTE   SUBJECTIVE (INTERVAL HISTORY) Pt RN at the bedside. Pt lying in bed, no complains, still has severe left hemiplegia. But AAO x 3. TEE done today unremarkable. Discussed with pt about loop and LP, pt not interested in loop but OK for LP. Consent obtained.    OBJECTIVE Temp:  [98.2 F (36.8 C)-99.5 F (37.5 C)] 98.2 F (36.8 C) (10/06 1224) Pulse Rate:  [64-91] 70 (10/06 1251) Cardiac Rhythm: Normal sinus rhythm (10/06 0753) Resp:  [10-25] 16 (10/06 1251) BP: (110-200)/(67-94) 153/76 (10/06 1251) SpO2:  [97 %-100 %] 98 % (10/06 1251)  Recent Labs  Lab 06/13/20 2115  GLUCAP 88   Recent Labs  Lab 06/13/20 2115 06/13/20 2126 06/15/20 0318 06/15/20 0842  NA 138 139 138  --   K 3.8 3.5 3.3*  --   CL 104 105 106  --   CO2 20*  --  22  --   GLUCOSE 85 90 101*  --   BUN $Re'18 19 18  'lKS$ --   CREATININE 1.34* 1.30* 1.45*  --   CALCIUM 10.3  --  9.8  --   MG  --   --   --  1.8   Recent Labs  Lab 06/13/20 2115  AST 20  ALT 26  ALKPHOS 83  BILITOT 0.6  PROT 7.8  ALBUMIN 3.4*   Recent Labs  Lab 06/13/20 2115 06/13/20 2126 06/15/20 0842  WBC 8.7  --  5.9  NEUTROABS 5.7  --  3.7  HGB 12.2 12.9 9.6*  HCT 38.0 38.0 30.3*  MCV 89.8  --  87.6  PLT 179  --  157   No results for input(s): CKTOTAL, CKMB, CKMBINDEX, TROPONINI in the last 168 hours. Recent Labs    06/13/20 2115 06/15/20 0318  LABPROT 12.5 14.2  INR 1.0 1.1   Recent Labs    06/15/20 0355  COLORURINE YELLOW  LABSPEC 1.017  PHURINE 5.0  GLUCOSEU NEGATIVE  HGBUR NEGATIVE  BILIRUBINUR NEGATIVE  KETONESUR 5*  PROTEINUR NEGATIVE  NITRITE NEGATIVE  LEUKOCYTESUR NEGATIVE       Component Value Date/Time   CHOL 132 06/13/2020 2115   TRIG 61 06/13/2020 2115   HDL 73 06/13/2020 2115   CHOLHDL 1.8 06/13/2020 2115   VLDL 12 06/13/2020 2115   LDLCALC 47 06/13/2020 2115   Lab Results  Component Value Date   HGBA1C 5.9 (H) 04/28/2020      Component Value Date/Time   LABOPIA  NONE DETECTED 06/15/2020 0355   COCAINSCRNUR NONE DETECTED 06/15/2020 0355   LABBENZ NONE DETECTED 06/15/2020 0355   AMPHETMU NONE DETECTED 06/15/2020 0355   THCU NONE DETECTED 06/15/2020 0355   LABBARB NONE DETECTED 06/15/2020 0355    No results for input(s): ETH in the last 168 hours.  I have personally reviewed the radiological images below and agree with the radiology interpretations.  DG Chest 1 View  Result Date: 06/14/2020 CLINICAL DATA:  Vomiting.  Altered mental status. EXAM: CHEST  1 VIEW COMPARISON:  05/22/2020 FINDINGS: The heart is enlarged but stable from prior study. Both lungs are clear. The visualized skeletal structures are unremarkable. There are atherosclerotic changes of the thoracic aorta. IMPRESSION: No active disease. Electronically Signed   By: Constance Holster M.D.   On: 06/14/2020 00:47   DG Abd 1 View  Result Date: 06/14/2020 CLINICAL DATA:  Altered mental status with nausea and vomiting. EXAM: ABDOMEN - 1 VIEW COMPARISON:  05/23/2020 FINDINGS: The bowel gas pattern is normal. No  radio-opaque calculi or other significant radiographic abnormality are seen. IMPRESSION: Negative. Electronically Signed   By: Constance Holster M.D.   On: 06/14/2020 00:48   DG Abd 1 View  Result Date: 05/23/2020 CLINICAL DATA:  Abdominal pain, mid to lower EXAM: ABDOMEN - 1 VIEW COMPARISON:  None. FINDINGS: No dilated small bowel loops. Mild colonic stool. No evidence of pneumatosis or pneumoperitoneum. Surgical clips are seen in the right upper quadrant of the abdomen. Clear lung bases. Lower lumbar spondylosis. IMPRESSION: Nonobstructive bowel gas pattern. Electronically Signed   By: Ilona Sorrel M.D.   On: 05/23/2020 11:22   MR ANGIO HEAD WO CONTRAST  Result Date: 06/13/2020 CLINICAL DATA:  Initial evaluation for neuro deficit, stroke suspected. History of recent right ACA territory infarct. EXAM: MRI HEAD WITHOUT AND WITH CONTRAST MRA HEAD WITHOUT CONTRAST TECHNIQUE: Multiplanar,  multiecho pulse sequences of the brain and surrounding structures were obtained without and with intravenous contrast. Angiographic images of the head were obtained using MRA technique without contrast. CONTRAST:  6.49mL GADAVIST GADOBUTROL 1 MMOL/ML IV SOLN COMPARISON:  Comparison made with CT from earlier same day as well as previous CTA and MRI from 04/27/2020. FINDINGS: MRI HEAD FINDINGS Brain: Diffuse prominence of the CSF containing spaces compatible with generalized age-related cerebral atrophy. Patchy and confluent T2/FLAIR hyperintensity within the periventricular and deep white matter both cerebral hemispheres most consistent with chronic small vessel ischemic disease, advanced in nature. Patchy involvement of the deep gray nuclei, brainstem, and cerebellum noted as well. Few scattered remote lacunar infarcts present about the hemispheric cerebral white matter and thalami. 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 Associated enhancement seen following contrast administration. No associated hemorrhage or significant mass effect. Few additional scattered foci of restricted diffusion involving the subcortical posterior left frontal region, right periatrial white matter, and splenium of the corpus callosum also consistent with acute to subacute ischemic infarcts. No associated hemorrhage. These findings are new as compared to prior exam. 1.4 x 2.3 x 1.7 cm well-circumscribed lobulated T1/FLAIR hyperintense lesion seen involving the left cerebellum (series 4, image 7). Lesion demonstrates fairly avid root restricted diffusion. No significant associated enhancement or regional mass effect. Finding favored to reflect an evolving subacute hematoma, new as compared to prior MRI. Finding is difficult to see on  prior CT performed earlier the same day. No other findings to suggest acute or subacute intracranial hemorrhage. Multiple additional chronic microhemorrhages noted, predominantly clustered about the thalami, favored to be related to chronic underlying poorly controlled hypertension given distribution. This additional finding suggests that the left cerebellar hematoma may be hypertensive in nature as well. No mass lesion, mass effect, or midline shift. No hydrocephalus or extra-axial fluid collection. No other abnormal enhancement. Pituitary gland and suprasellar region within normal limits. Midline structures intact. Vascular: Major intracranial vascular flow voids are maintained. Skull and upper cervical spine: Craniocervical junction within normal limits. Bone marrow signal intensity normal. No scalp soft tissue abnormality. Sinuses/Orbits: Globes and orbital soft tissues within normal limits. Left maxillary sinus retention cyst. Paranasal sinuses are otherwise clear. No mastoid effusion. Other: None. MRA HEAD FINDINGS ANTERIOR CIRCULATION: Examination moderately degraded by motion artifact. Visualized distal cervical segments of the internal carotid arteries are patent with symmetric antegrade flow. Petrous segments patent bilaterally. Scattered atheromatous change noted throughout the carotid siphons, grossly similar to previous CTA. A1 segments  patent bilaterally. Normal anterior communicating artery complex. Left ACA widely patent. Right A2 segment hypoplastic and diminutive, but remains grossly patent. M1 segments patent bilaterally. Normal MCA bifurcations. Distal MCA branches well perfused and symmetric. POSTERIOR CIRCULATION: Vertebral arteries patent to the vertebrobasilar junction without stenosis. Left PICA patent at its origin. Right PICA not seen. Basilar grossly patent to its distal aspect without appreciable stenosis. Superior cerebral arteries patent bilaterally. Both PCAs primarily supplied via  the basilar. Scattered atheromatous change about the PCAs bilaterally without occlusion, grossly similar to previous. No appreciable aneurysm. IMPRESSION: MRI HEAD IMPRESSION: 1. 1.4 x 2.3 x 1.7 cm well-circumscribed lobulated T1/FLAIR hyperintense lesion involving the left cerebellum, favored to reflect an evolving subacute hematoma, new as compared to prior MRI. No significant regional mass effect. Finding suspected to be hypertensive in nature. 2. Interval expansion and enlargement of previously identified right ACA territory infarct, now moderate in size and acute to subacute in appearance. No significant mass effect. 3. Few additional scattered acute to subacute ischemic nonhemorrhagic infarcts involving the subcortical posterior left frontal region, right periatrial white matter, and splenium of the corpus callosum. 4. Underlying age-related cerebral atrophy with advanced chronic microvascular ischemic disease. 5. Multiple chronic microhemorrhages, predominantly clustered about the thalami, favored to be related to chronic poorly controlled hypertension given distribution. 6. Underlying atrophy with advanced chronic small vessel ischemic disease. MRA HEAD IMPRESSION: 1. Motion degraded exam. 2. Grossly stable appearance of the intracranial circulation. No large vessel occlusion. Scattered atheromatous change about the carotid siphons, right ACA, and PCAs. No definite new or progressive finding. Electronically Signed   By: Jeannine Boga M.D.   On: 06/13/2020 23:29   MR BRAIN W WO CONTRAST  Result Date: 06/13/2020 CLINICAL DATA:  Initial evaluation for neuro deficit, stroke suspected. History of recent right ACA territory infarct. EXAM: MRI HEAD WITHOUT AND WITH CONTRAST MRA HEAD WITHOUT CONTRAST TECHNIQUE: Multiplanar, multiecho pulse sequences of the brain and surrounding structures were obtained without and with intravenous contrast. Angiographic images of the head were obtained using MRA technique  without contrast. CONTRAST:  6.80mL GADAVIST GADOBUTROL 1 MMOL/ML IV SOLN COMPARISON:  Comparison made with CT from earlier same day as well as previous CTA and MRI from 04/27/2020. FINDINGS: MRI HEAD FINDINGS Brain: Diffuse prominence of the CSF containing spaces compatible with generalized age-related cerebral atrophy. Patchy and confluent T2/FLAIR hyperintensity within the periventricular and deep white matter both cerebral hemispheres most consistent with chronic small vessel ischemic disease, advanced in nature. Patchy involvement of the deep gray nuclei, brainstem, and cerebellum noted as well. Few scattered remote lacunar infarcts present about the hemispheric cerebral white matter and thalami. 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 Associated enhancement seen following contrast administration. No associated hemorrhage or significant mass effect. Few additional scattered foci of restricted diffusion involving the subcortical posterior left frontal region, right periatrial white matter, and splenium of the corpus callosum also consistent with acute to subacute ischemic infarcts. No associated hemorrhage. These findings are new as compared to prior exam. 1.4 x 2.3 x 1.7 cm well-circumscribed lobulated T1/FLAIR hyperintense lesion seen involving the left cerebellum (series 4, image 7). Lesion demonstrates fairly avid root restricted diffusion. No significant associated enhancement or regional mass effect. Finding favored to reflect an evolving subacute hematoma, new as compared to prior MRI. Finding is difficult to see on prior  CT performed earlier the same day. No other findings to suggest acute or subacute intracranial hemorrhage. Multiple additional chronic microhemorrhages noted, predominantly clustered  about the thalami, favored to be related to chronic underlying poorly controlled hypertension given distribution. This additional finding suggests that the left cerebellar hematoma may be hypertensive in nature as well. No mass lesion, mass effect, or midline shift. No hydrocephalus or extra-axial fluid collection. No other abnormal enhancement. Pituitary gland and suprasellar region within normal limits. Midline structures intact. Vascular: Major intracranial vascular flow voids are maintained. Skull and upper cervical spine: Craniocervical junction within normal limits. Bone marrow signal intensity normal. No scalp soft tissue abnormality. Sinuses/Orbits: Globes and orbital soft tissues within normal limits. Left maxillary sinus retention cyst. Paranasal sinuses are otherwise clear. No mastoid effusion. Other: None. MRA HEAD FINDINGS ANTERIOR CIRCULATION: Examination moderately degraded by motion artifact. Visualized distal cervical segments of the internal carotid arteries are patent with symmetric antegrade flow. Petrous segments patent bilaterally. Scattered atheromatous change noted throughout the carotid siphons, grossly similar to previous CTA. A1 segments patent bilaterally. Normal anterior communicating artery complex. Left ACA widely patent. Right A2 segment hypoplastic and diminutive, but remains grossly patent. M1 segments patent bilaterally. Normal MCA bifurcations. Distal MCA branches well perfused and symmetric. POSTERIOR CIRCULATION: Vertebral arteries patent to the vertebrobasilar junction without stenosis. Left PICA patent at its origin. Right PICA not seen. Basilar grossly patent to its distal aspect without appreciable stenosis. Superior cerebral arteries patent bilaterally. Both PCAs primarily supplied via the basilar. Scattered atheromatous change about the PCAs bilaterally without occlusion, grossly similar to previous. No appreciable aneurysm. IMPRESSION: MRI HEAD IMPRESSION: 1. 1.4 x 2.3 x  1.7 cm well-circumscribed lobulated T1/FLAIR hyperintense lesion involving the left cerebellum, favored to reflect an evolving subacute hematoma, new as compared to prior MRI. No significant regional mass effect. Finding suspected to be hypertensive in nature. 2. Interval expansion and enlargement of previously identified right ACA territory infarct, now moderate in size and acute to subacute in appearance. No significant mass effect. 3. Few additional scattered acute to subacute ischemic nonhemorrhagic infarcts involving the subcortical posterior left frontal region, right periatrial white matter, and splenium of the corpus callosum. 4. Underlying age-related cerebral atrophy with advanced chronic microvascular ischemic disease. 5. Multiple chronic microhemorrhages, predominantly clustered about the thalami, favored to be related to chronic poorly controlled hypertension given distribution. 6. Underlying atrophy with advanced chronic small vessel ischemic disease. MRA HEAD IMPRESSION: 1. Motion degraded exam. 2. Grossly stable appearance of the intracranial circulation. No large vessel occlusion. Scattered atheromatous change about the carotid siphons, right ACA, and PCAs. No definite new or progressive finding. Electronically Signed   By: Jeannine Boga M.D.   On: 06/13/2020 23:29   MR PELVIS WO CONTRAST  Result Date: 05/28/2020 CLINICAL DATA:  Pelvic pain. EXAM: MRI PELVIS WITHOUT CONTRAST TECHNIQUE: Multiplanar multisequence MR imaging of the pelvis was performed. No intravenous contrast was administered. COMPARISON:  None. FINDINGS: Bones: No hip fracture, dislocation or avascular necrosis. No periosteal reaction or bone destruction. No aggressive osseous lesion. Normal sacrum and sacroiliac joints. No SI joint widening or erosive changes. Degenerative disc disease with disc height loss at L5-S1. minimal grade 1 anterolisthesis of L4 on L5 secondary to moderate bilateral facet arthropathy. Bilateral  foraminal narrowing at L4-5 with mild spinal stenosis. Articular cartilage and labrum Articular cartilage:  No chondral defect. Labrum: Grossly intact, but evaluation is limited by lack of intraarticular fluid. Joint or bursal effusion Joint effusion:  No hip joint effusion.  No SI joint effusion. Bursae:  No bursa formation. Muscles and tendons Flexors: Normal. Extensors: Normal. Abductors: Normal. Adductors: Mild muscle edema in the left adductor musculature likely reflecting muscle strain versus myositis. Gluteals: Mild muscle edema in the gluteus minimus and medius muscle bilaterally which may reflect mild myositis or muscle strain, left greater than right. Hamstrings: Normal. Other findings No pelvic free fluid. No fluid collection or hematoma. No inguinal lymphadenopathy. No inguinal hernia. Soft tissue edema in the subcutaneous fat superficial to the gluteus maximus muscle bilaterally likely reflecting contusions or fat necrosis. IMPRESSION: 1. No hip fracture, dislocation or avascular necrosis. 2. Mild muscle edema in the left adductor musculature likely reflecting muscle strain versus myositis. 3. Mild muscle edema in the gluteus minimus and medius muscle bilaterally which may reflect mild myositis or muscle strain, left greater than right. 4. Soft tissue edema in the subcutaneous fat superficial to the gluteus maximus muscle bilaterally likely reflecting contusions or fat necrosis. Electronically Signed   By: Elige Ko   On: 05/28/2020 09:58   DG CHEST PORT 1 VIEW  Result Date: 05/22/2020 CLINICAL DATA:  Persistent cough EXAM: PORTABLE CHEST 1 VIEW COMPARISON:  09/19/2015 FINDINGS: No focal opacity or pleural effusion. Mild cardiomegaly. Aortic atherosclerosis. No pneumothorax. Skin fold artifact over the right lower chest. IMPRESSION: No active disease.  Mild cardiomegaly. Electronically Signed   By: Jasmine Pang M.D.   On: 05/22/2020 19:47   ECHOCARDIOGRAM COMPLETE  Result Date: 06/14/2020     ECHOCARDIOGRAM REPORT   Patient Name:   Lorri Frederick Bachtell Date of Exam: 06/14/2020 Medical Rec #:  [id number]           Height:       63.0 in Accession #:    [id number]          Weight:       145.0 lb Date of Birth:  09/16/1960           BSA:          1.687 m Patient Age:    59 years            BP:           190/105 mmHg Patient Gender: F                   HR:           75 bpm. Exam Location:  Inpatient Procedure: 2D Echo, Cardiac Doppler and Color Doppler Indications:    Stroke 434.91 / I163.9  History:        Patient has prior history of Echocardiogram examinations, most                 recent 04/27/2020. Stroke; Risk Factors:Hypertension.  Sonographer:    Eulah Pont RDCS Referring Phys: [id number] Floreen Comber PATEL  Sonographer Comments: patient on right side IMPRESSIONS  1. Intracavitary gradient, peak velocity 2.38 m/s. Peak gradient 22.7 mmHg.Marland Kitchen Left ventricular ejection fraction, by estimation, is 60 to 65%. The left ventricle has normal function. The left ventricle has no regional wall motion abnormalities. There is mild concentric left ventricular hypertrophy. Left ventricular diastolic parameters are consistent with Grade I diastolic dysfunction (impaired relaxation). Elevated left ventricular end-diastolic pressure.  2. Right ventricular systolic function is normal. The right ventricular size is normal. There is normal pulmonary artery systolic pressure.  3. Left atrial size was mildly dilated.  4. The mitral valve is normal in structure. No evidence of mitral valve regurgitation. No evidence of  mitral stenosis.  5. The aortic valve is normal in structure. Aortic valve regurgitation is not visualized. No aortic stenosis is present.  6. The inferior vena cava is normal in size with greater than 50% respiratory variability, suggesting right atrial pressure of 3 mmHg. FINDINGS  Left Ventricle: Intracavitary gradient, peak velocity 2.38 m/s. Peak gradient 22.7 mmHg. Left ventricular ejection fraction, by  estimation, is 60 to 65%. The left ventricle has normal function. The left ventricle has no regional wall motion abnormalities. The left ventricular internal cavity size was normal in size. There is mild concentric left ventricular hypertrophy. Left ventricular diastolic parameters are consistent with Grade I diastolic dysfunction (impaired relaxation). Elevated left ventricular end-diastolic pressure. Right Ventricle: The right ventricular size is normal. No increase in right ventricular wall thickness. Right ventricular systolic function is normal. There is normal pulmonary artery systolic pressure. The tricuspid regurgitant velocity is 1.24 m/s, and  with an assumed right atrial pressure of 3 mmHg, the estimated right ventricular systolic pressure is 9.2 mmHg. Left Atrium: Left atrial size was mildly dilated. Right Atrium: Right atrial size was normal in size. Pericardium: There is no evidence of pericardial effusion. Mitral Valve: The mitral valve is normal in structure. No evidence of mitral valve regurgitation. No evidence of mitral valve stenosis. Tricuspid Valve: The tricuspid valve is normal in structure. Tricuspid valve regurgitation is trivial. No evidence of tricuspid stenosis. Aortic Valve: The aortic valve is normal in structure. Aortic valve regurgitation is not visualized. No aortic stenosis is present. Pulmonic Valve: The pulmonic valve was normal in structure. Pulmonic valve regurgitation is not visualized. No evidence of pulmonic stenosis. Aorta: The aortic root is normal in size and structure. Venous: The inferior vena cava is normal in size with greater than 50% respiratory variability, suggesting right atrial pressure of 3 mmHg. IAS/Shunts: No atrial level shunt detected by color flow Doppler.  LEFT VENTRICLE PLAX 2D LVIDd:         4.88 cm  Diastology LVIDs:         3.47 cm  LV e' medial:    3.65 cm/s LV PW:         1.23 cm  LV E/e' medial:  15.3 LV IVS:        1.07 cm  LV e' lateral:   3.97  cm/s LVOT diam:     1.90 cm  LV E/e' lateral: 14.1 LV SV:         73 LV SV Index:   44 LVOT Area:     2.84 cm  RIGHT VENTRICLE RV S prime:     15.80 cm/s TAPSE (M-mode): 1.7 cm LEFT ATRIUM             Index       RIGHT ATRIUM           Index LA diam:        3.50 cm 2.08 cm/m  RA Area:     13.20 cm LA Vol (A2C):   59.9 ml 35.51 ml/m RA Volume:   29.70 ml  17.61 ml/m LA Vol (A4C):   40.1 ml 23.78 ml/m LA Biplane Vol: 50.2 ml 29.76 ml/m  AORTIC VALVE LVOT Vmax:   155.00 cm/s LVOT Vmean:  121.000 cm/s LVOT VTI:    0.259 m  AORTA Ao Root diam: 2.70 cm Ao Asc diam:  2.60 cm MITRAL VALVE               TRICUSPID VALVE MV Area (PHT): 2.50 cm  TR Peak grad:   6.2 mmHg MV Decel Time: 303 msec    TR Vmax:        124.00 cm/s MV E velocity: 56.00 cm/s MV A velocity: 74.40 cm/s  SHUNTS MV E/A ratio:  0.75        Systemic VTI:  0.26 m                            Systemic Diam: 1.90 cm Skeet Latch MD Electronically signed by Skeet Latch MD Signature Date/Time: 06/14/2020/1:15:36 PM    Final    ECHO TEE  Result Date: 06/15/2020    TRANSESOPHOGEAL ECHO REPORT   Patient Name:   Jacqualine Mau Klinke Date of Exam: 06/15/2020 Medical Rec #:  [id number]           Height:       63.0 in Accession #:    [id number]          Weight:       145.0 lb Date of Birth:  Jul 31, 1961           BSA:          1.687 m Patient Age:    22 years            BP:           180/76 mmHg Patient Gender: F                   HR:           78 bpm. Exam Location:  Inpatient Procedure: Cardiac Doppler, Color Doppler and Transesophageal Echo Indications:     Stroke  History:         Patient has prior history of Echocardiogram examinations, most                  recent 06/14/2020. CHF; Risk Factors:Hypertension. H/O CVA. CKD.  Sonographer:     Clayton Lefort RDCS (AE) Referring Phys:  [place] Phys: Rudean Haskell MD PROCEDURE: After discussion of the risks and benefits of a TEE, an informed consent was obtained from the patient.  The transesophogeal probe was passed without difficulty through the esophogus of the patient. Local oropharyngeal anesthetic was provided with Cetacaine. Sedation performed by different physician. The patient was monitored while under deep sedation. Anesthestetic sedation was provided intravenously by Anesthesiology: 151.86mg  of Propofol, 100mg  of Lidocaine. Image quality was good. The patient developed no complications during the procedure. IMPRESSIONS  1. Left ventricular ejection fraction, by estimation, is 60 to 65%. The left ventricle has normal function. The left ventricle has no regional wall motion abnormalities. Left ventricular diastolic function could not be evaluated.  2. Right ventricular systolic function is normal. The right ventricular size is normal.  3. Left atrial size was mildly dilated. No left atrial/left atrial appendage thrombus was detected.  4. The mitral valve is normal in structure. Mild mitral valve regurgitation.  5. The aortic valve is tricuspid. Aortic valve regurgitation is not visualized.  6. There is mild (Grade II) plaque.  7. Agitated saline contrast bubble study was negative, with no evidence of any interatrial shunt. Conclusion(s)/Recommendation(s): No Evidence of intracardiac thrombus. There is some evidence of concentric hypertrophy in the mid and apex without apical aneurysm. Consider outpatient, TTE with contrast, in follow up if clinically indicated. FINDINGS  Left Ventricle: There is evidence of midventricular and apical hypertrophy, best observed in deep gastric imaging. No denfinitive  evidence for apical hyperrophy (HCM Variant). Left ventricular ejection fraction, by estimation, is 60 to 65%. The left ventricle has normal function. The left ventricle has no regional wall motion abnormalities. The left ventricular internal cavity size was normal in size. Left ventricular diastolic function could not be evaluated. Right Ventricle: The right ventricular size is normal. No  increase in right ventricular wall thickness. Right ventricular systolic function is normal. Left Atrium: Left atrial size was mildly dilated. No left atrial/left atrial appendage thrombus was detected. Right Atrium: Prominent Eustachain ridge. Right atrial size was normal in size. Pericardium: Trivial pericardial effusion is present. Mitral Valve: The mitral valve is normal in structure. Mild mitral valve regurgitation. Tricuspid Valve: The tricuspid valve is normal in structure. Tricuspid valve regurgitation is trivial. Aortic Valve: The aortic valve is tricuspid. Aortic valve regurgitation is not visualized. Pulmonic Valve: The pulmonic valve was grossly normal. Pulmonic valve regurgitation is not visualized. Aorta: The aortic root and ascending aorta are structurally normal, with no evidence of dilitation. There is mild (Grade II) plaque. IAS/Shunts: No atrial level shunt detected by color flow Doppler. Agitated saline contrast was given intravenously to evaluate for intracardiac shunting. Agitated saline contrast bubble study was negative, with no evidence of any interatrial shunt.  LEFT VENTRICLE PLAX 2D LVOT diam:     2.10 cm LVOT Area:     3.46 cm   AORTA Ao Root diam: 2.80 cm Ao Asc diam:  2.90 cm  SHUNTS Systemic Diam: 2.10 cm Rudean Haskell MD Electronically signed by Rudean Haskell MD Signature Date/Time: 06/15/2020/1:18:01 PM    Final    DG HIP UNILAT WITH PELVIS 2-3 VIEWS LEFT  Result Date: 05/18/2020 CLINICAL DATA:  Left groin pain. EXAM: DG HIP (WITH OR WITHOUT PELVIS) 2-3V LEFT COMPARISON:  None. FINDINGS: There is no evidence of hip fracture or dislocation. There is no evidence of arthropathy or other focal bone abnormality. IMPRESSION: Negative. Electronically Signed   By: Kathreen Devoid   On: 05/18/2020 10:39   CT HEAD CODE STROKE WO CONTRAST  Result Date: 06/13/2020 CLINICAL DATA:  Code stroke. Initial evaluation for acute left facial droop, slurred speech. Patient has a history  of a recent right ACA territory infarct. EXAM: CT HEAD WITHOUT CONTRAST TECHNIQUE: Contiguous axial images were obtained from the base of the skull through the vertex without intravenous contrast. COMPARISON:  Prior CT from 09/19/2015. Additionally, there is a previous brain MRI from 04/27/2020, reviewed in epic, but not available for review in PACS. FINDINGS: Brain: Generalized age-related cerebral atrophy. Patchy and confluent hypodensity involving the periventricular deep white matter of both cerebral hemispheres most consistent with chronic small vessel ischemic disease. Patchy involvement of the deep gray nuclei, brainstem, and cerebellum noted as well. Appearance is fairly advanced in nature. Few scattered remote lacunar infarcts noted about the bilateral thalami. There is cortical and subcortical hypodensity involving the parasagittal right frontoparietal region, consistent with right ACA territory ischemia. Finding corresponds with previously identified right ACA territory infarct. However, appearance is somewhat more prominent on current exam as compared to previous brain MRI, raising the possibility for possible interval expansion and/or new acute component. Punctate hyperdensity at the posterior aspect of this area of ischemia suspicious for a small focus of associated petechial hemorrhage (series 3, image 23). No frank hemorrhagic transformation or mass effect. No other evidence for acute large vessel territory infarct. Gray-white matter differentiation otherwise grossly maintained. No mass lesion or midline shift. No hydrocephalus or extra-axial fluid collection. Vascular: No hyperdense vessel.  Scattered vascular calcifications noted within the carotid siphons. Skull: Scalp soft tissues and calvarium within normal limits. Sinuses/Orbits: Globes and orbital soft tissues demonstrate no acute finding. Left maxillary sinus retention cyst noted. Paranasal sinuses are otherwise largely clear. No mastoid  effusion. Other: Poor dentition noted. ASPECTS [facility] Stroke Program Early CT Score) - Ganglionic level infarction (caudate, lentiform nuclei, internal capsule, insula, M1-M3 cortex): 7 - Supraganglionic infarction (M4-M6 cortex): 3 Total score (0-10 with 10 being normal): 10 IMPRESSION: 1. Cortical and subcortical hypodensity involving the parasagittal right frontoparietal region, consistent with previously identified right ACA territory infarct. Appearance is somewhat more prominent on current exam as compared to previous brain MRI, raising the possibility for possible interval expansion and/or new acute component. Punctate hyperdensity at the posterior aspect of this area of ischemia suspicious for a small focus of associated petechial hemorrhage. No frank hemorrhagic transformation or mass effect. 2. ASPECTS is 10. 3. 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 These results were communicated to Dr. Rory Percy at 9:35 pmon 10/4/2021by text page via the [facility] messaging system. Findings also discussed by telephone with Dr. Rory Percy at time of dictation. Electronically Signed   By: Jeannine Boga M.D.   On: 06/13/2020 21:49   VAS [place] LOWER EXTREMITY VENOUS (DVT)  Result Date: 06/14/2020  Lower Venous DVTStudy Indications: Stroke.  Comparison Study: no prior Performing Technologist: Abram Sander RVS  Examination Guidelines: A complete evaluation includes B-mode imaging, spectral Doppler, color Doppler, and power Doppler as needed of all accessible portions of each vessel. Bilateral testing is considered an integral part of a complete examination. Limited examinations for reoccurring indications may be performed as noted. The reflux portion of the exam is performed with the patient in reverse Trendelenburg.  +---------+---------------+---------+-----------+----------+--------------+ RIGHT    CompressibilityPhasicitySpontaneityPropertiesThrombus Aging  +---------+---------------+---------+-----------+----------+--------------+ CFV      Full           Yes      Yes                                 +---------+---------------+---------+-----------+----------+--------------+ SFJ      Full                                                        +---------+---------------+---------+-----------+----------+--------------+ FV Prox  Full                                                        +---------+---------------+---------+-----------+----------+--------------+ FV Mid   Full                                                        +---------+---------------+---------+-----------+----------+--------------+ FV DistalFull                                                        +---------+---------------+---------+-----------+----------+--------------+  PFV      Full                                                        +---------+---------------+---------+-----------+----------+--------------+ POP      Full           Yes      Yes                                 +---------+---------------+---------+-----------+----------+--------------+ PTV      Full                                                        +---------+---------------+---------+-----------+----------+--------------+ PERO     Full                                                        +---------+---------------+---------+-----------+----------+--------------+   +---------+---------------+---------+-----------+----------+--------------+ LEFT     CompressibilityPhasicitySpontaneityPropertiesThrombus Aging +---------+---------------+---------+-----------+----------+--------------+ CFV      Full           Yes      Yes                                 +---------+---------------+---------+-----------+----------+--------------+ SFJ      Full                                                         +---------+---------------+---------+-----------+----------+--------------+ FV Prox  Full                                                        +---------+---------------+---------+-----------+----------+--------------+ FV Mid   Full                                                        +---------+---------------+---------+-----------+----------+--------------+ FV DistalFull                                                        +---------+---------------+---------+-----------+----------+--------------+ PFV      Full                                                        +---------+---------------+---------+-----------+----------+--------------+  POP      Full           Yes      Yes                                 +---------+---------------+---------+-----------+----------+--------------+ PTV      Full                                                        +---------+---------------+---------+-----------+----------+--------------+ PERO     Full                                                        +---------+---------------+---------+-----------+----------+--------------+     Summary: BILATERAL: - No evidence of deep vein thrombosis seen in the lower extremities, bilaterally. - No evidence of superficial venous thrombosis in the lower extremities, bilaterally. -   *See table(s) above for measurements and observations. Electronically signed by Servando Snare MD on 06/14/2020 at 12:16:46 PM.    Final     PHYSICAL EXAM  Temp:  [98.2 F (36.8 C)-99.5 F (37.5 C)] 98.2 F (36.8 C) (10/06 1224) Pulse Rate:  [64-91] 70 (10/06 1251) Resp:  [10-25] 16 (10/06 1251) BP: (110-200)/(67-94) 153/76 (10/06 1251) SpO2:  [97 %-100 %] 98 % (10/06 1251)  General - Well nourished, well developed, in no apparent distress.  Ophthalmologic - fundi not visualized due to noncooperation.  Cardiovascular - Regular rhythm and rate.  Mental Status -  Level of arousal and  orientation to time, place, and person were intact. Language including expression, naming, repetition, comprehension was assessed and found intact. Fund of Knowledge was assessed and was intact.  Cranial Nerves II - XII - II - Visual field intact OU. III, IV, VI - Extraocular movements intact. V - Facial sensation intact bilaterally. VII - left facial droop. VIII - Hearing & vestibular intact bilaterally. X - Palate elevates symmetrically. XI - Chin turning & shoulder shrug intact bilaterally. XII - Tongue protrusion to the left.  Motor Strength - The patient's strength was at least 4/5 in right upper and 3/5 lower extremities, however left upper extremity 1/5 proximal and 0/5 distal, right upper extremity no spontaneous movement, slight withdraw to pain stimulation.  Bulk was normal and fasciculations were absent.   Motor Tone - Muscle tone was assessed at the neck and appendages and was normal.  Reflexes - The patient's reflexes were symmetrical in all extremities and she had no pathological reflexes.  Sensory - Light touch, temperature/pinprick were assessed and were symmetrical.    Coordination - The patient had normal movements in the right hand with no ataxia or dysmetria.  Tremor was absent.  Gait and Station - deferred.   ASSESSMENT/PLAN Ms. JAZZELLE ZHANG is a 59 y.o. female with history of hypertension, stroke admitted for worsening left facial droop and slurred speech and left-sided weakness. No tPA given due to also window and recent stroke.    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 Large vessel stenosis/athero in the  setting of BP fluctuation - MRA and CTA showed right ACA and bilateral PCA stenosis.  Patient current strokes correlating to stenosed vessel territories, concerning for BP related phenomena 2. 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 Autoimmune labs neg. Will do LP 4. Hypercoagulable state - will check hypercoagulable labs, may consider pan-CT to rule out malignancy   CT head showed right ACA subacute infarct, larger than stroke in 04/2020  MRI Interval expansion and enlargement of previously identified right ACA territory infarct, now moderate in size and acute to subacute in appearance. Few additional scattered acute to subacute ischemic nonhemorrhagic infarcts involving the subcortical posterior left frontal region, right periatrial white matter, and splenium of the corpus callosum.  MRA Scattered atheromatous change about the carotid siphons, right ACA, and PCAs   2D Echo EF 60 to 65%  TEE no LV or LAA thrombus, neg bubble. LVH.  LE venous Doppler negative  LDL 47  Autoimmune negative   Hypercoagulable work up pending  Lovenox for VTE prophylaxis - Lovenox was on hold for LP  aspirin 325 mg daily prior to admission, now on aspirin 81 mg daily given need of LP  Therapy recommendations: SNF  Disposition: Pending  Palliative care consulted  Subacute left cerebellum hemorrhage with CMBs  MRI showed 1.4 x 2.3 x 1.7 cm well-circumscribed lobulated T1/FLAIR hyperintense lesion involving the left cerebellum, favored to reflect an evolving subacute hematoma  MRI also showed multiple chronic microhemorrhages, predominantly clustered about the thalami, favored to be related to chronic poorly controlled hypertension given distribution.  TEE showed LVH also consistent with chronic HTN  BP goal < 180 for 24-48h, and then gradually normalize in 3 to 5 days.  History of stroke  Admitted in 04/2020 for left-sided weakness and slurred speech.  MRI showed right ACA infarct.  CTA head and neck showed right A2 stenosis, bilateral  P2 stenosis, bilateral siphon 50% stenosis, right more than left.  EF 60 to 65%.  TCD bubble study negative.  LDL 82, A1c 5.9.  Put on DAPT for 3 weeks as well as Lipitor 40.  Elevated ESR and CRP  ESR 75->67  CRP 47->4.0  Autoimmune labs negative  Hypertensive urgency . Stable on the high end . On amlodipine 10, metoprolol 25 bid . resume hydralazine 50 Q8 . Gradually lower BP to goal within 3-5 days.  Long term BP goal 130-150 given multifocal intracranial stenosis  Hyperlipidemia  Home meds:  Lipitor 40   LDL 47, goal < 70  Now on Lipitor 40  Continue statin at discharge  Other Active Problems  OA  Hypokalemia, replete  Nausea/vomiting/constipation  CKD stage IIa  Hospital day # 2   Rosalin Hawking, MD PhD Stroke Neurology 06/15/2020 2:35 PM    To contact Stroke Continuity provider, please refer to [email or web address] After hours, contact General Neurology

## 2020-06-15 NOTE — Telephone Encounter (Signed)
I am unable to prescribe anything for a patient prior to them being seen and the patient did not keep their appt.  She can give her 2 tylenol every 4 hours.  '  Thanks, Freeman Caldron, PA-C

## 2020-06-16 ENCOUNTER — Telehealth: Payer: Self-pay

## 2020-06-16 ENCOUNTER — Inpatient Hospital Stay (HOSPITAL_COMMUNITY): Payer: 59

## 2020-06-16 DIAGNOSIS — I6340 Cerebral infarction due to embolism of unspecified cerebral artery: Secondary | ICD-10-CM

## 2020-06-16 LAB — COMPREHENSIVE METABOLIC PANEL
ALT: 24 U/L (ref 0–44)
AST: 19 U/L (ref 15–41)
Albumin: 3 g/dL — ABNORMAL LOW (ref 3.5–5.0)
Alkaline Phosphatase: 69 U/L (ref 38–126)
Anion gap: 13 (ref 5–15)
BUN: 17 mg/dL (ref 6–20)
CO2: 20 mmol/L — ABNORMAL LOW (ref 22–32)
Calcium: 9.8 mg/dL (ref 8.9–10.3)
Chloride: 109 mmol/L (ref 98–111)
Creatinine, Ser: 1.43 mg/dL — ABNORMAL HIGH (ref 0.44–1.00)
GFR calc non Af Amer: 40 mL/min — ABNORMAL LOW (ref >60)
Glucose, Bld: 96 mg/dL (ref 70–99)
Potassium: 3.7 mmol/L (ref 3.5–5.1)
Sodium: 142 mmol/L (ref 135–145)
Total Bilirubin: 0.6 mg/dL (ref 0.3–1.2)
Total Protein: 6.6 g/dL (ref 6.5–8.1)

## 2020-06-16 LAB — CBC WITH DIFFERENTIAL/PLATELET
Abs Immature Granulocytes: 0.02 10*3/uL (ref 0.00–0.07)
Basophils Absolute: 0.1 10*3/uL (ref 0.0–0.1)
Basophils Relative: 1 %
Eosinophils Absolute: 0.1 10*3/uL (ref 0.0–0.5)
Eosinophils Relative: 2 %
HCT: 30.1 % — ABNORMAL LOW (ref 36.0–46.0)
Hemoglobin: 9.6 g/dL — ABNORMAL LOW (ref 12.0–15.0)
Immature Granulocytes: 0 %
Lymphocytes Relative: 16 %
Lymphs Abs: 1.3 10*3/uL (ref 0.7–4.0)
MCH: 28.3 pg (ref 26.0–34.0)
MCHC: 31.9 g/dL (ref 30.0–36.0)
MCV: 88.8 fL (ref 80.0–100.0)
Monocytes Absolute: 0.6 10*3/uL (ref 0.1–1.0)
Monocytes Relative: 7 %
Neutro Abs: 6 10*3/uL (ref 1.7–7.7)
Neutrophils Relative %: 74 %
Platelets: 158 10*3/uL (ref 150–400)
RBC: 3.39 MIL/uL — ABNORMAL LOW (ref 3.87–5.11)
RDW: 14.7 % (ref 11.5–15.5)
WBC: 8.1 10*3/uL (ref 4.0–10.5)
nRBC: 0 % (ref 0.0–0.2)

## 2020-06-16 LAB — BETA-2-GLYCOPROTEIN I ABS, IGG/M/A
Beta-2 Glyco I IgG: <9 GPI IgG units (ref 0–20)
Beta-2-Glycoprotein I IgA: <9 GPI IgA units (ref 0–25)
Beta-2-Glycoprotein I IgM: <9 GPI IgM units (ref 0–32)

## 2020-06-16 LAB — PHOSPHORUS: Phosphorus: 3.3 mg/dL (ref 2.5–4.6)

## 2020-06-16 LAB — GLUCOSE, CSF: Glucose, CSF: 58 mg/dL (ref 40–70)

## 2020-06-16 LAB — CSF CELL COUNT WITH DIFFERENTIAL
RBC Count, CSF: 2 /mm3 — ABNORMAL HIGH
Tube #: 3
WBC, CSF: 9 /mm3 — ABNORMAL HIGH (ref 0–5)

## 2020-06-16 LAB — GRAM STAIN

## 2020-06-16 LAB — PROTEIN, CSF: Total  Protein, CSF: 36 mg/dL (ref 15–45)

## 2020-06-16 LAB — CRYPTOCOCCAL ANTIGEN, CSF: Crypto Ag: NEGATIVE

## 2020-06-16 LAB — MAGNESIUM: Magnesium: 1.9 mg/dL (ref 1.7–2.4)

## 2020-06-16 MED ORDER — LIDOCAINE HCL (PF) 1 % IJ SOLN
5.0000 mL | Freq: Once | INTRAMUSCULAR | Status: AC
  Administered 2020-06-16: 5 mL via INTRADERMAL

## 2020-06-16 MED ORDER — CLOPIDOGREL BISULFATE 75 MG PO TABS
75.0000 mg | ORAL_TABLET | Freq: Every day | ORAL | Status: DC
  Administered 2020-06-17 – 2020-06-18 (×2): 75 mg via ORAL
  Filled 2020-06-16 (×2): qty 1

## 2020-06-16 MED ORDER — ASPIRIN EC 325 MG PO TBEC
325.0000 mg | DELAYED_RELEASE_TABLET | Freq: Every day | ORAL | Status: DC
  Administered 2020-06-17 – 2020-06-18 (×2): 325 mg via ORAL
  Filled 2020-06-16 (×2): qty 1

## 2020-06-16 MED ORDER — ENOXAPARIN SODIUM 40 MG/0.4ML ~~LOC~~ SOLN
40.0000 mg | SUBCUTANEOUS | Status: DC
  Administered 2020-06-17 – 2020-06-18 (×2): 40 mg via SUBCUTANEOUS
  Filled 2020-06-16 (×2): qty 0.4

## 2020-06-16 MED ORDER — IOHEXOL 9 MG/ML PO SOLN
ORAL | Status: AC
  Administered 2020-06-16: 500 mL
  Filled 2020-06-16: qty 500

## 2020-06-16 MED ORDER — LABETALOL HCL 5 MG/ML IV SOLN
10.0000 mg | INTRAVENOUS | Status: DC | PRN
  Administered 2020-06-16 – 2020-06-18 (×5): 10 mg via INTRAVENOUS
  Filled 2020-06-16 (×5): qty 4

## 2020-06-16 NOTE — Progress Notes (Signed)
PROGRESS NOTE    [name]  VQM:[id number] DOB: 1961/05/18 DOA: 06/13/2020 PCP: Argentina Donovan, PA-C   Brief Narrative:  HPI per Dr. Zada Finders on 06/13/20 [name] is a 59 y.o. female with medical history significant for uncontrolled hypertension, CKD stage III, and recent stroke on 04/27/2020 involving right ACA territory (with residual left-sided hemiparesis and dysarthria) felt to be embolic due to unknown source who presents to the ED as a code stroke for evaluation of worsening left facial weakness and slurred speech compared to recent baseline.  Patient recently admitted 04/26/2020-05/03/2020 for acute right ACA branch infarct.  She was started on DAPT x3 weeks and transition to aspirin 81 mg alone.  She had negative work-up for source of stroke.  She had residual deficits of left-sided hemiparesis and dysarthria.  She was discharged to [facility] where she stayed until discharged from inpatient rehab 06/01/2020.  Patient was last known well around 2 PM today when family saw her.  On recheck afterwards they noticed that she had worsening left facial droop and slurred speech.  She was unable to move left side of her body which is unchanged since her recent stroke.  EMS were called and on their arrival she was noted to have SBP in the 200s.  Patient was reported to have recent severe constipation followed by a very large bowel movement.  She has had some episodes of emesis recently.  Currently patient has no complaints and denies chest pain, dyspnea, further nausea/vomiting, abdominal pain, dysuria.  She reports chronic pain in her left leg for which she has been taking tramadol and recently started on meloxicam.  ED Course:  Initial vitals showed BP 191/97, pulse 92, RR 18, SPO2 100% on room air.  Labs show WBC 8.7, hemoglobin 12.2, platelets 179,000, sodium 138, potassium 3.8, bicarb 20, BUN 18, creatinine 1.34, serum glucose 85, LFTs within normal limits.  I-STAT  beta-hCG <5.0.  Respiratory PCR panel is ordered and pending.  CT head without contrast showed previously identified right ACA territory infarct changes with appearance raising possibility for possible interval expansion and/or new acute component.  MRI brain and MRA head without contrast showed an evolving subacute hematoma involving the left cerebellum which is new compared to prior MRI.  Interval expansion and enlargement of previously identified right ACA territory infarct seen, now moderate in size and acute to subacute in appearance.  Scattered acute to subacute ischemic nonhemorrhagic infarcts involving the subcortical posterior left frontal region, right parietal white matter, and splenium of the corpus callosum also seen.  Multiple chronic microhemorrhages, predominantly clustered about the thalami identified.  Neurology were consulted and recommended medical admission for careful blood pressure management.  Recommending obtaining TEE and avoiding any antiplatelets or anticoagulants for now.  The hospitalist service was consulted to admit for further evaluation and management.  **Interim History Patient is undergoing full stroke work-up and underwent a TEE on 06/15/20 which showed no evidence of intracardiac thrombus.  Patient has declined a loop recorder but because her inflammatory markers are still elevated and we still do not know why she has had these CVAs so Neurology has recommeded an LP was unsuccessful last night but done under fluoroscopy this morning.  Inflammatory markers are still elevated.  Blood pressure still remains slightly elevated.  PT OT recommended skilled nursing facility in transitional care assisting with placement.  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 No significant regional mass effect. Finding suspected to be hypertensive in nature. Interval expansion and enlargement of previously identified right ACA territory infarct, now moderate in size and acute to subacute in appearance. No significant mass effect. Few additional scattered acute to subacute ischemic nonhemorrhagic infarcts involving the subcortical posterior left frontal region, right periatrial white matter, and splenium of the corpus callosum. Underlying age-related cerebral atrophy with advanced chronic microvascular ischemic disease. Multiple chronic microhemorrhages, predominantly clustered about the thalami, favored to be related to chronic poorly controlled hypertension given distribution. Underlying atrophy with advanced chronic small vessel ischemic disease." -Brain MRA showed "Motion degraded exam. Grossly stable appearance of the intracranial circulation. No large vessel occlusion. Scattered atheromatous change about the carotid siphons, right ACA, and PCAs. No definite new or progressive finding." -Neurology consulted and following.  Holding anticoagulants and antiplatelets at this time. -Cardiology consulted for TEE and showed "No Evidence of intracardiac thrombus. There is some evidence of concentric hypertrophy in the mid and apex without apical aneurysm. Consider outpatient, TTE with contrast, in follow up if clinically indicated."   -Patient Declines Loop recorder -Neuro Recommending LP and will pursue event unclear etiology of her CVAs.  Could be a vasculitis so neurology will pursue an LP as she has declined a loop recorder; LP done this morning under fluoroscopy -Negative lower  extremity Dopplers for DVT.   -PT/OT/SLP consulted and are recommending skilled nursing facility   -2D echo showed ejection fraction of 60 to 74%, grade 1 diastolic dysfunction, no intracardiac source of emboli. -She was taking Aspirin at home.we will continue aspirin 81 mg p.o. daily, as well as atorvastatin 40 mg daily -Her LDL as per August this year was 82 and hemoglobin A1c was 5.9.  Hypertensive Emergency -Severely hypertensive on presentation.  -Blood pressure goal of 140/90 but this Afternoon was 165/88 -Continue with amlodipine 10 mg p.o. daily, isosorbide mononitrate 60 mils p.o. daily, metoprolol 25 mg p.o. twice daily, and she has as needed medications labetalol 10 mg IV every 2 as needed for high blood pressure for systolic blood pressure of 081 or diastolic blood pressure of 100 and will add IV hydralazine 10 mg every 6 as needed for systolic blood pressure of 448 or diastolic blood pressure greater than 90 -Continue as needed meds for severe hypertension.  She is hypertensive and is more than 24 hours after presentation -Follow blood pressures per protocol  Nausea/Vomiting/Constipation -Probably nausea and vomiting related with intracranial hematoma.   -Currently much improved.  She was also constipated on presentation and she had a large bowel movement. -Continue with supportive care and antiemetics and bowel regimen IV fluid hydration has now stopped  CKD stage IIIa Metabolic Acidosis -Currently kidney function at baseline but mildly worsened -Patient's CO2 is now 20, AG was 13, Chloride Level was 109 -Patient's BUN/Cr went from 19/1.30 -> 18/1.45 and today BUN/creatinine was 17/1.43 -She is currently getting IV fluid hydration with normal saline at 50 mils per hour and will now stop -Avoid further nephrotoxic medications, contrast dyes, hypotension and renally dose medications -Repeat CMP in a.m.  Elevated Inflammatory Markers -Elevated CRP (4.0) and ESR (67).  ANA/RF negative.  Vasculitis is a possibility.We will check ANCAs and are negative -Neurology planning on doing an LP to rule out CNS vasculitis however this is unlikely per Dr. Phoebe Sharps recommendation; and LP was unsuccessful so it had to be done under fluoroscopy and CSF sent for analysis showed a clear appearance with 2 RBCs, 9 WBCs, too few cells to count.  Gram stain done and showed no organisms present with cultures pending  Hypokalemia -Patient's potassium this morning was 3.7 and improved from 3.3 -Replete with p.o. potassium chloride 40 mEQ twice daily x2 doses yesterday -Continue monitor and replete as necessary -Repeat CMP in the AM   Normocytic Anemia -Patient's Hgb/Hct went from 9.6/30.3 -> 9.6/30.1 -Check Anemia Panel in the AM  -Continue to Monitor for S/Sx of Bleeding -Repeat CBC in the AM   Overweight -Estimated body mass index is 25.69 kg/m as calculated from the following:   Height as of 06/08/20: 5' 3"  (1.6 m).   Weight as of 06/08/20: 65.8 kg. -Continued Weight Loss and Dietary Counseling given   DVT prophylaxis: SCDs Code Status: FULL CODE Family Communication: Discussed with daughter at bedside Disposition Plan: Pending clearance by neurology but she will likely need skilled nursing facility  Status is: Inpatient  Remains inpatient appropriate because:Altered mental status, Ongoing diagnostic testing needed not appropriate for outpatient work up, IV treatments appropriate due to intensity of illness or inability to take PO and Inpatient level of care appropriate due to severity of illness   Dispo: The patient is from: Home              Anticipated d/c is to: SNF              Anticipated d/c date is: 2 days              Patient currently is not medically stable to d/c.  Consultants:   Neurology  Cardiology for TEE   Procedures:  ECHOCARDIOGRAM IMPRESSIONS    1. Intracavitary gradient, peak velocity 2.38 m/s. Peak gradient 22.7  mmHg.Marland Kitchen Left  ventricular ejection fraction, by estimation, is 60 to 65%.  The left ventricle has normal function. The left ventricle has no regional  wall motion abnormalities. There is  mild concentric left ventricular hypertrophy. Left ventricular diastolic  parameters are consistent with Grade I diastolic dysfunction (impaired  relaxation). Elevated left ventricular end-diastolic pressure.  2. Right ventricular systolic function is normal. The right ventricular  size is normal. There is normal pulmonary artery systolic pressure.  3. Left atrial size was mildly dilated.  4. The mitral valve is normal in structure. No evidence of mitral valve  regurgitation. No evidence of mitral stenosis.  5. The aortic valve is normal in structure. Aortic valve regurgitation is  not visualized. No aortic stenosis is present.  6. The inferior vena cava is normal in size with greater than 50%  respiratory variability, suggesting right atrial pressure of 3 mmHg.   FINDINGS  Left Ventricle: Intracavitary gradient, peak velocity 2.38 m/s. Peak  gradient 22.7 mmHg. Left ventricular ejection fraction, by estimation, is  60 to 65%. The left ventricle has normal function. The left ventricle has  no regional wall motion  abnormalities. The left ventricular internal cavity size was normal in  size. There is mild concentric left ventricular hypertrophy. Left  ventricular diastolic parameters are consistent with Grade I diastolic  dysfunction (impaired relaxation). Elevated  left ventricular end-diastolic pressure.   Right Ventricle: The right ventricular size is normal. No increase in  right ventricular  wall thickness. Right ventricular systolic function is  normal. There is normal pulmonary artery systolic pressure. The tricuspid  regurgitant velocity is 1.24 m/s, and  with an assumed right atrial pressure of 3 mmHg, the estimated right  ventricular systolic pressure is 9.2 mmHg.   Left Atrium: Left atrial size  was mildly dilated.   Right Atrium: Right atrial size was normal in size.   Pericardium: There is no evidence of pericardial effusion.   Mitral Valve: The mitral valve is normal in structure. No evidence of  mitral valve regurgitation. No evidence of mitral valve stenosis.   Tricuspid Valve: The tricuspid valve is normal in structure. Tricuspid  valve regurgitation is trivial. No evidence of tricuspid stenosis.   Aortic Valve: The aortic valve is normal in structure. Aortic valve  regurgitation is not visualized. No aortic stenosis is present.   Pulmonic Valve: The pulmonic valve was normal in structure. Pulmonic valve  regurgitation is not visualized. No evidence of pulmonic stenosis.   Aorta: The aortic root is normal in size and structure.   Venous: The inferior vena cava is normal in size with greater than 50%  respiratory variability, suggesting right atrial pressure of 3 mmHg.   IAS/Shunts: No atrial level shunt detected by color flow Doppler.     LEFT VENTRICLE  PLAX 2D  LVIDd:     4.88 cm Diastology  LVIDs:     3.47 cm LV e' medial:  3.65 cm/s  LV PW:     1.23 cm LV E/e' medial: 15.3  LV IVS:    1.07 cm LV e' lateral:  3.97 cm/s  LVOT diam:   1.90 cm LV E/e' lateral: 14.1  LV SV:     73  LV SV Index:  44  LVOT Area:   2.84 cm     RIGHT VENTRICLE  RV S prime:   15.80 cm/s  TAPSE (M-mode): 1.7 cm   LEFT ATRIUM       Index    RIGHT ATRIUM      Index  LA diam:    3.50 cm 2.08 cm/m RA Area:   13.20 cm  LA Vol (A2C):  59.9 ml 35.51 ml/m RA Volume:  29.70 ml 17.61 ml/m  LA Vol (A4C):  40.1 ml 23.78 ml/m  LA Biplane Vol: 50.2 ml 29.76 ml/m  AORTIC VALVE  LVOT Vmax:  155.00 cm/s  LVOT Vmean: 121.000 cm/s  LVOT VTI:  0.259 m    AORTA  Ao Root diam: 2.70 cm  Ao Asc diam: 2.60 cm   MITRAL VALVE        TRICUSPID VALVE  MV Area (PHT): 2.50 cm  TR Peak grad:  6.2 mmHg  MV Decel  Time: 303 msec  TR Vmax:    124.00 cm/s  MV E velocity: 56.00 cm/s  MV A velocity: 74.40 cm/s SHUNTS  MV E/A ratio: 0.75    Systemic VTI: 0.26 m               Systemic Diam: 1.90 cm   TEE IMPRESSIONS    1. Left ventricular ejection fraction, by estimation, is 60 to 65%. The  left ventricle has normal function. The left ventricle has no regional  wall motion abnormalities. Left ventricular diastolic function could not  be evaluated.  2. Right ventricular systolic function is normal. The right ventricular  size is normal.  3. Left atrial size was mildly dilated. No left atrial/left atrial  appendage thrombus was detected.  4. The mitral valve  is normal in structure. Mild mitral valve  regurgitation.  5. The aortic valve is tricuspid. Aortic valve regurgitation is not  visualized.  6. There is mild (Grade II) plaque.  7. Agitated saline contrast bubble study was negative, with no evidence  of any interatrial shunt.   Conclusion(s)/Recommendation(s): No Evidence of intracardiac thrombus.  There is some evidence of concentric hypertrophy in the mid and apex  without apical aneurysm. Consider outpatient, TTE with contrast, in follow  up if clinically indicated.   FINDINGS  Left Ventricle: There is evidence of midventricular and apical  hypertrophy, best observed in deep gastric imaging. No denfinitive  evidence for apical hyperrophy (HCM Variant). Left ventricular ejection  fraction, by estimation, is 60 to 65%. The left  ventricle has normal function. The left ventricle has no regional wall  motion abnormalities. The left ventricular internal cavity size was normal  in size. Left ventricular diastolic function could not be evaluated.   Right Ventricle: The right ventricular size is normal. No increase in  right ventricular wall thickness. Right ventricular systolic function is  normal.   Left Atrium: Left atrial size was mildly dilated. No  left atrial/left  atrial appendage thrombus was detected.   Right Atrium: Prominent Eustachain ridge. Right atrial size was normal in  size.   Pericardium: Trivial pericardial effusion is present.   Mitral Valve: The mitral valve is normal in structure. Mild mitral valve  regurgitation.   Tricuspid Valve: The tricuspid valve is normal in structure. Tricuspid  valve regurgitation is trivial.   Aortic Valve: The aortic valve is tricuspid. Aortic valve regurgitation is  not visualized.   Pulmonic Valve: The pulmonic valve was grossly normal. Pulmonic valve  regurgitation is not visualized.   Aorta: The aortic root and ascending aorta are structurally normal, with  no evidence of dilitation. There is mild (Grade II) plaque.   IAS/Shunts: No atrial level shunt detected by color flow Doppler. Agitated  saline contrast was given intravenously to evaluate for intracardiac  shunting. Agitated saline contrast bubble study was negative, with no  evidence of any interatrial shunt.     LEFT VENTRICLE  PLAX 2D  LVOT diam:   2.10 cm  LVOT Area:   3.46 cm       AORTA  Ao Root diam: 2.80 cm  Ao Asc diam: 2.90 cm     SHUNTS  Systemic Diam: 2.10 cm   Antimicrobials:   Anti-infectives (From admission, onward)   None       Subjective: Seen and examined and she feels ok. No CP or SOB. Denies any lightheadedness or dizziness. Still feels weak on the Left side. Denies any other complaints or concerns at this time.  Objective: Vitals:   06/15/20 2311 06/16/20 0319 06/16/20 0800 06/16/20 1100  BP: (!) 154/77 (!) 179/91 (!) 151/77 (!) 165/88  Pulse: 77 91 85 88  Resp: 16 17 17 16   Temp: 99.3 F (37.4 C) 98.8 F (37.1 C) (!) 97.4 F (36.3 C) 98.4 F (36.9 C)  TempSrc: Oral Oral Oral Oral  SpO2: 99% 99% 100% 100%    Intake/Output Summary (Last 24 hours) at 06/16/2020 1507 Last data filed at 06/16/2020 1200 Gross per 24 hour  Intake 809.17 ml  Output 925 ml  Net  -115.83 ml   There were no vitals filed for this visit.  Examination: Physical Exam:  Constitutional: WN/WD overweight AAF in NAD and appears calm and comfortable Eyes: Lids and conjunctivae normal, sclerae anicteric  ENMT: External Ears,  Nose appear normal. Grossly normal hearing.  Neck: Appears normal, supple, no cervical masses, normal ROM, no appreciable thyromegaly; no JVD Respiratory: Diminished to auscultation bilaterally, no wheezing, rales, rhonchi or crackles. Normal respiratory effort and patient is not tachypenic. No accessory muscle use. Unlabored Cardiovascular: RRR, no murmurs / rubs / gallops. S1 and S2 auscultated. No appreciable LE Edema Abdomen: Soft, non-tender, non-distended. Bowel sounds positive x4.  GU: Deferred. Musculoskeletal: No clubbing / cyanosis of digits/nails. No joint deformity upper and lower extremities. Skin: No rashes, lesions, ulcers on a limited skin. No induration; Warm and dry.  Neurologic: CN 2-12 grossly intact with no focal deficits. Romberg sign and cerebellar reflexes not assessed.  Psychiatric: Normal judgment and insight. Alert and oriented x 3. Normal mood and appropriate affect.   Data Reviewed: I have personally reviewed following labs and imaging studies  CBC: Recent Labs  Lab 06/13/20 2115 06/13/20 2126 06/15/20 0842 06/16/20 0444  WBC 8.7  --  5.9 8.1  NEUTROABS 5.7  --  3.7 6.0  HGB 12.2 12.9 9.6* 9.6*  HCT 38.0 38.0 30.3* 30.1*  MCV 89.8  --  87.6 88.8  PLT 179  --  157 767   Basic Metabolic Panel: Recent Labs  Lab 06/13/20 2115 06/13/20 2126 06/15/20 0318 06/15/20 0842 06/16/20 0444  NA 138 139 138  --  142  K 3.8 3.5 3.3*  --  3.7  CL 104 105 106  --  109  CO2 20*  --  22  --  20*  GLUCOSE 85 90 101*  --  96  BUN 18 19 18   --  17  CREATININE 1.34* 1.30* 1.45*  --  1.43*  CALCIUM 10.3  --  9.8  --  9.8  MG  --   --   --  1.8 1.9  PHOS  --   --   --   --  3.3   GFR: Estimated Creatinine Clearance: 39.1  mL/min (A) (by C-G formula based on SCr of 1.43 mg/dL (H)). Liver Function Tests: Recent Labs  Lab 06/13/20 2115 06/16/20 0444  AST 20 19  ALT 26 24  ALKPHOS 83 69  BILITOT 0.6 0.6  PROT 7.8 6.6  ALBUMIN 3.4* 3.0*   No results for input(s): LIPASE, AMYLASE in the last 168 hours. No results for input(s): AMMONIA in the last 168 hours. Coagulation Profile: Recent Labs  Lab 06/13/20 2115 06/15/20 0318  INR 1.0 1.1   Cardiac Enzymes: No results for input(s): CKTOTAL, CKMB, CKMBINDEX, TROPONINI in the last 168 hours. BNP (last 3 results) No results for input(s): PROBNP in the last 8760 hours. HbA1C: No results for input(s): HGBA1C in the last 72 hours. CBG: Recent Labs  Lab 06/13/20 2115  GLUCAP 88   Lipid Profile: Recent Labs    06/13/20 2115  CHOL 132  HDL 73  LDLCALC 47  TRIG 61  CHOLHDL 1.8   Thyroid Function Tests: No results for input(s): TSH, T4TOTAL, FREET4, T3FREE, THYROIDAB in the last 72 hours. Anemia Panel: No results for input(s): VITAMINB12, FOLATE, FERRITIN, TIBC, IRON, RETICCTPCT in the last 72 hours. Sepsis Labs: No results for input(s): PROCALCITON, LATICACIDVEN in the last 168 hours.  Recent Results (from the past 240 hour(s))  Respiratory Panel by RT PCR (Flu A&B, Covid) - Nasopharyngeal Swab     Status: None   Collection Time: 06/14/20  1:38 AM   Specimen: Nasopharyngeal Swab  Result Value Ref Range Status   SARS Coronavirus 2 by RT PCR NEGATIVE  NEGATIVE Final    Comment: (NOTE) SARS-CoV-2 target nucleic acids are NOT DETECTED.  The SARS-CoV-2 RNA is generally detectable in upper respiratoy specimens during the acute phase of infection. The lowest concentration of SARS-CoV-2 viral copies this assay can detect is 131 copies/mL. A negative result does not preclude SARS-Cov-2 infection and should not be used as the sole basis for treatment or other patient management decisions. A negative result may occur with  improper specimen  collection/handling, submission of specimen other than nasopharyngeal swab, presence of viral mutation(s) within the areas targeted by this assay, and inadequate number of viral copies (<131 copies/mL). A negative result must be combined with clinical observations, patient history, and epidemiological information. The expected result is Negative.  Fact Sheet for Patients:  PinkCheek.be  Fact Sheet for Healthcare Providers:  GravelBags.it  This test is no t yet approved or cleared by the [place] FDA and  has been authorized for detection and/or diagnosis of SARS-CoV-2 by FDA under an Emergency Use Authorization (EUA). This EUA will remain  in effect (meaning this test can be used) for the duration of the COVID-19 declaration under Section 564(b)(1) of the Act, 21 U.S.C. section 360bbb-3(b)(1), unless the authorization is terminated or revoked sooner.     Influenza A by PCR NEGATIVE NEGATIVE Final   Influenza B by PCR NEGATIVE NEGATIVE Final    Comment: (NOTE) The Xpert Xpress SARS-CoV-2/FLU/RSV assay is intended as an aid in  the diagnosis of influenza from Nasopharyngeal swab specimens and  should not be used as a sole basis for treatment. Nasal washings and  aspirates are unacceptable for Xpert Xpress SARS-CoV-2/FLU/RSV  testing.  Fact Sheet for Patients: PinkCheek.be  Fact Sheet for Healthcare Providers: GravelBags.it  This test is not yet approved or cleared by the [place] FDA and  has been authorized for detection and/or diagnosis of SARS-CoV-2 by  FDA under an Emergency Use Authorization (EUA). This EUA will remain  in effect (meaning this test can be used) for the duration of the  Covid-19 declaration under Section 564(b)(1) of the Act, 21  U.S.C. section 360bbb-3(b)(1), unless the authorization is  terminated or revoked. Performed at [facility], [place] [address] [place], [place] [address]   Gram stain     Status: None (Preliminary result)   Collection Time: 06/16/20  9:53 AM   Specimen: PATH Cytology CSF; Cerebrospinal Fluid  Result Value Ref Range Status   Specimen Description CSF  Final   Special Requests NONE  Final   Gram Stain   Final    CYTOSPIN SMEAR NO WBC SEEN NO ORGANISMS SEEN Performed at [facility], [place] [address], [place], [place] [address]    Report Status PENDING  Incomplete     RN Pressure Injury Documentation:     Estimated body mass index is 25.69 kg/m as calculated from the following:   Height as of 06/08/20: 5' 3"  (1.6 m).   Weight as of 06/08/20: 65.8 kg.  Malnutrition Type:      Malnutrition Characteristics:      Nutrition Interventions:    Radiology Studies: ECHO TEE  Result Date: 06/15/2020    TRANSESOPHOGEAL ECHO REPORT   Patient Name:   Jacqualine Mau Tokarski Date of Exam: 06/15/2020 Medical Rec #:  [id number]           Height:       63.0 in Accession #:    [id number]          Weight:  145.0 lb Date of Birth:  12-30-1960           BSA:          1.687 m Patient Age:    69 years            BP:           180/76 mmHg Patient Gender: F                   HR:           78 bpm. Exam Location:  Inpatient Procedure: Cardiac Doppler, Color Doppler and Transesophageal Echo Indications:     Stroke  History:         Patient has prior history of Echocardiogram examinations, most                  recent 06/14/2020. CHF; Risk Factors:Hypertension. H/O CVA. CKD.  Sonographer:     Clayton Lefort RDCS (AE) Referring Phys:  [place] Phys: Rudean Haskell MD PROCEDURE: After discussion of the risks and benefits of a TEE, an informed consent was obtained from the patient. The transesophogeal probe was passed without difficulty through the esophogus of the patient. Local oropharyngeal anesthetic was provided with Cetacaine. Sedation performed by different physician. The  patient was monitored while under deep sedation. Anesthestetic sedation was provided intravenously by Anesthesiology: 151.46m of Propofol, 1074mof Lidocaine. Image quality was good. The patient developed no complications during the procedure. IMPRESSIONS  1. Left ventricular ejection fraction, by estimation, is 60 to 65%. The left ventricle has normal function. The left ventricle has no regional wall motion abnormalities. Left ventricular diastolic function could not be evaluated.  2. Right ventricular systolic function is normal. The right ventricular size is normal.  3. Left atrial size was mildly dilated. No left atrial/left atrial appendage thrombus was detected.  4. The mitral valve is normal in structure. Mild mitral valve regurgitation.  5. The aortic valve is tricuspid. Aortic valve regurgitation is not visualized.  6. There is mild (Grade II) plaque.  7. Agitated saline contrast bubble study was negative, with no evidence of any interatrial shunt. Conclusion(s)/Recommendation(s): No Evidence of intracardiac thrombus. There is some evidence of concentric hypertrophy in the mid and apex without apical aneurysm. Consider outpatient, TTE with contrast, in follow up if clinically indicated. FINDINGS  Left Ventricle: There is evidence of midventricular and apical hypertrophy, best observed in deep gastric imaging. No denfinitive evidence for apical hyperrophy (HCM Variant). Left ventricular ejection fraction, by estimation, is 60 to 65%. The left ventricle has normal function. The left ventricle has no regional wall motion abnormalities. The left ventricular internal cavity size was normal in size. Left ventricular diastolic function could not be evaluated. Right Ventricle: The right ventricular size is normal. No increase in right ventricular wall thickness. Right ventricular systolic function is normal. Left Atrium: Left atrial size was mildly dilated. No left atrial/left atrial appendage thrombus was  detected. Right Atrium: Prominent Eustachain ridge. Right atrial size was normal in size. Pericardium: Trivial pericardial effusion is present. Mitral Valve: The mitral valve is normal in structure. Mild mitral valve regurgitation. Tricuspid Valve: The tricuspid valve is normal in structure. Tricuspid valve regurgitation is trivial. Aortic Valve: The aortic valve is tricuspid. Aortic valve regurgitation is not visualized. Pulmonic Valve: The pulmonic valve was grossly normal. Pulmonic valve regurgitation is not visualized. Aorta: The aortic root and ascending aorta are structurally normal, with no evidence of dilitation. There is mild (  Grade II) plaque. IAS/Shunts: No atrial level shunt detected by color flow Doppler. Agitated saline contrast was given intravenously to evaluate for intracardiac shunting. Agitated saline contrast bubble study was negative, with no evidence of any interatrial shunt.  LEFT VENTRICLE PLAX 2D LVOT diam:     2.10 cm LVOT Area:     3.46 cm   AORTA Ao Root diam: 2.80 cm Ao Asc diam:  2.90 cm  SHUNTS Systemic Diam: 2.10 cm Rudean Haskell MD Electronically signed by Rudean Haskell MD Signature Date/Time: 06/15/2020/1:18:01 PM    Final    DG FL GUIDED LUMBAR PUNCTURE  Result Date: 06/16/2020 CLINICAL DATA:  Cryptogenic stroke. EXAM: DIAGNOSTIC LUMBAR PUNCTURE UNDER FLUOROSCOPIC GUIDANCE FLUOROSCOPY TIME:  Fluoroscopy Time:  2 minutes and 6 seconds Radiation Exposure Index (if provided by the fluoroscopic device): Not applicable. Number of Acquired Spot Images: 1 PROCEDURE: Informed consent was obtained from the patient prior to the procedure, including potential complications of headache, and pain. A "time out" was performed. With the patient prone, the lower back was prepped with Betadine. 1% Lidocaine was used for local anesthesia. Lumbar puncture was performed at the L3-4 level using a 20 gauge needle with return of clear CSF with an opening pressure of 20 cm water. 10 ml of  CSF were obtained for laboratory studies. The patient tolerated the procedure well and there were no apparent complications. IMPRESSION: Non complicated lumbar puncture as detailed above. Electronically Signed   By: Abigail Miyamoto M.D.   On: 06/16/2020 10:21   Scheduled Meds: .  stroke: mapping our early stages of recovery book   Does not apply Once  . amLODipine  10 mg Oral Daily  . aspirin EC  81 mg Oral Daily  . atorvastatin  40 mg Oral Daily  . ferrous sulfate  325 mg Oral BID WC  . FLUoxetine  10 mg Oral Daily  . hydrALAZINE  50 mg Oral Q8H  . isosorbide mononitrate  60 mg Oral Daily  . lidocaine  5 mL Intradermal Once  . metoprolol tartrate  25 mg Oral BID   Continuous Infusions:   LOS: 3 days   Kerney Elbe, DO [facility] PAGER is on [place]  If 7PM-7AM, please contact night-coverage [email or web address]

## 2020-06-16 NOTE — Telephone Encounter (Signed)
pt needs appt with a pcp scheduled. called pt. voicemail's full. sent sms

## 2020-06-16 NOTE — Progress Notes (Signed)
   06/16/20 1151  Clinical Encounter Type  Visited With Patient  Visit Type Follow-up  Referral From Chaplain  Consult/Referral To [place] responded to page that Pt was ready to complete AD. Chaplain checked paperwork and coordinated notary and witnesses. Volunteers Maryelizabeth Kaufmann and Verdene Rio and Notary Yvone Neu were present. Chaplain put a copy of completed AD in Pt's paper file and gave the original and 2 copies to Pt and Pt's daughter.    This note was prepared by Chaplain Resident, Dante Gang, MDiv. For questions, please contact by phone at [phone].

## 2020-06-16 NOTE — Progress Notes (Signed)
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 MRI reveals expansion of acute vs subacute R ACA CVA, new embolic acute vs subacute CVAs in bilateral hemispheres, and chronic-appearing L cerebellar ICH either due to hemorrhage or conversion of ischemic CVA (new since 04/2020).    PT Comments    Pt making progress towards her goals. She was able to sit up on EOB for inc periods of time with bouts of only min guard on occasion. She demonstrated dec pushing and was able to maintain midline when cued to place R elbow on R knee or reach R hand for end of bed. Continuously repeats phrases during session. Provided joint approximation to L LE to facilitate muscle activation as she displays little to no activation this date. Educated pt on how to compensate for L LE weakness by hooking R foot under the L to assist moving it, with success. Pt educated on [facility]. Pt will benefit from further acute PT services to maximize her independence and safety with functional mobility through addressing her deficits.   Follow Up Recommendations  SNF;Supervision/Assistance - 24 hour     Equipment Recommendations  None recommended by PT    Recommendations for Other Services       Precautions / Restrictions Precautions Precautions: Fall Precaution Comments: L hemiplegia, L inattention, pushing tendency with RUE to weak L side Restrictions Weight Bearing Restrictions: No    Mobility  Bed Mobility Overal bed mobility: Needs Assistance Bed Mobility: Rolling;Sidelying to Sit;Sit to Supine Rolling: Max assist Sidelying to sit: Max assist   Sit to supine: Max assist   General bed mobility comments: HOH cues to place R hand on L bed rail to roll. VC's and assistance provided to hook R  foot under L ankle to assist with bed mobility. Assistance required to manage LEs and trunk.  Transfers                    Ambulation/Gait                 Stairs             Wheelchair Mobility    Modified Rankin (Stroke Patients Only) Modified Rankin (Stroke Patients Only) Pre-Morbid Rankin Score: Severe disability Modified Rankin: Severe disability     Balance Overall balance assessment: Needs assistance Sitting-balance support: Feet supported;Single extremity supported Sitting balance-Leahy Scale: Poor Sitting balance - Comments: Pt sits EOB for up to ~8 min before returning to supine. Requires R UE support and minA-modA with intermittent bouts of ~20 sec with only min guard. Initially, pt leaning and pushing with R UE over to L, thus pt placed into R lateral lean onto R elbow with dec pushing noted. Pt tends to lean posteriorly and slightly to L, provided TC's at posterior-lateral ribs to correct upright posture, with no carryover. Cued pt to lean R elbow to R knee, with success to improve seated balance. Then cued pt to hold onto end of bed with R UE for midline positioning, success. Postural control: Posterior lean;Left lateral lean                                  Cognition Arousal/Alertness: Awake/alert Behavior  During Therapy: WFL for tasks assessed/performed Overall Cognitive Status: History of cognitive impairments - at baseline Area of Impairment: Orientation;Attention;Memory;Following commands;Problem solving                   Current Attention Level: Sustained Memory: Decreased short-term memory Following Commands: Follows one step commands consistently;Follows one step commands with increased time (requires repeated cues) Safety/Judgement: Decreased awareness of safety;Decreased awareness of deficits Awareness: Emergent Problem Solving: Slow processing;Decreased initiation;Difficulty sequencing;Requires verbal  cues;Requires tactile cues General Comments: Pt repeatedly stated "thank you" when provided VC's to correct her posture. Required repetitive cues and extra time to remain on task.      Exercises      General Comments General comments (skin integrity, edema, etc.): Provided joint approximation to R hip, knee, and ankle to facilitate muscle activation and maintain bone density      Pertinent Vitals/Pain Pain Assessment: 0-10 Pain Score: 7  Pain Location: L arm, leg Pain Descriptors / Indicators: Sore;Grimacing Pain Intervention(s): Limited activity within patient's tolerance;Monitored during session (pt reported receiving pain meds prior to PT )    Home Living                      Prior Function            PT Goals (current goals can now be found in the care plan section) Acute Rehab PT Goals Patient Stated Goal: to get better PT Goal Formulation: With patient Time For Goal Achievement: 06/28/20 Potential to Achieve Goals: Fair Progress towards PT goals: Progressing toward goals    Frequency    Min 3X/week      PT Plan Current plan remains appropriate    Co-evaluation     PT goals addressed during session: Balance;Mobility/safety with mobility        AM-PAC PT "6 Clicks" Mobility   Outcome Measure  Help needed turning from your back to your side while in a flat bed without using bedrails?: A Lot Help needed moving from lying on your back to sitting on the side of a flat bed without using bedrails?: A Lot Help needed moving to and from a bed to a chair (including a wheelchair)?: Total Help needed standing up from a chair using your arms (e.g., wheelchair or bedside chair)?: Total Help needed to walk in hospital room?: Total Help needed climbing 3-5 steps with a railing? : Total 6 Click Score: 8    End of Session   Activity Tolerance: Patient limited by fatigue;Patient limited by pain Patient left: in bed;with bed alarm set;with call bell/phone  within reach   PT Visit Diagnosis: Hemiplegia and hemiparesis;Muscle weakness (generalized) (M62.81);Other symptoms and signs involving the nervous system (R29.898);Pain Hemiplegia - Right/Left: Left Hemiplegia - dominant/non-dominant: Non-dominant Hemiplegia - caused by: Cerebral infarction Pain - Right/Left: Left Pain - part of body: Arm;Leg     Time: 6503-5465 PT Time Calculation (min) (ACUTE ONLY): 25 min  Charges:  $Therapeutic Activity: 8-22 mins $Neuromuscular Re-education: 8-22 mins                     Moishe Spice, PT, DPT Acute Rehabilitation Services  Pager: [phone] Office: [place] 06/16/2020, 4:48 PM

## 2020-06-16 NOTE — Progress Notes (Signed)
   06/16/20 1035  Clinical Encounter Type  Visited With Patient and family together  Visit Type Initial  Referral From Nurse  Consult/Referral To Chaplain  Spiritual Encounters  Spiritual Needs Emotional  Stress Factors  Patient Stress Factors Health changes  Family Stress Factors Health changes   Chaplain responded to referral for AD. Chaplain provided AD education and answered Pt and Pt's daughter, [name], questions. Chaplain provided emotional support as Pt was tearful when talking about her wishes. Pt also expressed an interest in DNR code status. Chaplain remains available as needed.  This note was prepared by Chaplain Resident, Dante Gang, MDiv. Chaplain remains available as needed through the on-call pager: [phone].

## 2020-06-16 NOTE — Anesthesia Postprocedure Evaluation (Signed)
Anesthesia Post Note  Patient: [name]  Procedure(s) Performed: TRANSESOPHAGEAL ECHOCARDIOGRAM (TEE) (N/A ) BUBBLE STUDY     Patient location during evaluation: Endoscopy Anesthesia Type: MAC Level of consciousness: awake and alert Pain management: pain level controlled Vital Signs Assessment: post-procedure vital signs reviewed and stable Respiratory status: spontaneous breathing, nonlabored ventilation, respiratory function stable and patient connected to nasal cannula oxygen Cardiovascular status: stable and blood pressure returned to baseline Postop Assessment: no apparent nausea or vomiting Anesthetic complications: no   No complications documented.  Last Vitals:  Vitals:   06/16/20 1100 06/16/20 1500  BP: (!) 165/88 (!) 170/73  Pulse: 88 86  Resp: 16   Temp: 36.9 C 36.9 C  SpO2: 100%     Last Pain:  Vitals:   06/16/20 1500  TempSrc:   PainSc: 0-No pain                 Catalina Gravel

## 2020-06-16 NOTE — Progress Notes (Signed)
STROKE TEAM PROGRESS NOTE   SUBJECTIVE (INTERVAL HISTORY) PT is at bedside.  Patient is sitting at the edge of bed working with PT.  Had a fluoroscopy guided LP this morning.  CSF showed WBC 9, RBC 2, protein normal, glucose slightly low.  Curbside with ID Dr. Linus Salmons, no concern of CNS infection.  Will check pan CT to rule out malignancy.  OBJECTIVE Temp:  [97.4 F (36.3 C)-99.3 F (37.4 C)] 98.4 F (36.9 C) (10/07 1100) Pulse Rate:  [74-91] 88 (10/07 1100) Cardiac Rhythm: Normal sinus rhythm (10/07 0748) Resp:  [12-17] 16 (10/07 1100) BP: (151-179)/(77-91) 165/88 (10/07 1100) SpO2:  [99 %-100 %] 100 % (10/07 1100)  Recent Labs  Lab 06/13/20 2115  GLUCAP 88   Recent Labs  Lab 06/13/20 2115 06/13/20 2126 06/15/20 0318 06/15/20 0842 06/16/20 0444  NA 138 139 138  --  142  K 3.8 3.5 3.3*  --  3.7  CL 104 105 106  --  109  CO2 20*  --  22  --  20*  GLUCOSE 85 90 101*  --  96  BUN 18 19 18   --  17  CREATININE 1.34* 1.30* 1.45*  --  1.43*  CALCIUM 10.3  --  9.8  --  9.8  MG  --   --   --  1.8 1.9  PHOS  --   --   --   --  3.3   Recent Labs  Lab 06/13/20 2115 06/16/20 0444  AST 20 19  ALT 26 24  ALKPHOS 83 69  BILITOT 0.6 0.6  PROT 7.8 6.6  ALBUMIN 3.4* 3.0*   Recent Labs  Lab 06/13/20 2115 06/13/20 2126 06/15/20 0842 06/16/20 0444  WBC 8.7  --  5.9 8.1  NEUTROABS 5.7  --  3.7 6.0  HGB 12.2 12.9 9.6* 9.6*  HCT 38.0 38.0 30.3* 30.1*  MCV 89.8  --  87.6 88.8  PLT 179  --  157 158   No results for input(s): CKTOTAL, CKMB, CKMBINDEX, TROPONINI in the last 168 hours. Recent Labs    06/13/20 2115 06/15/20 0318  LABPROT 12.5 14.2  INR 1.0 1.1   Recent Labs    06/15/20 0355  COLORURINE YELLOW  LABSPEC 1.017  PHURINE 5.0  GLUCOSEU NEGATIVE  HGBUR NEGATIVE  BILIRUBINUR NEGATIVE  KETONESUR 5*  PROTEINUR NEGATIVE  NITRITE NEGATIVE  LEUKOCYTESUR NEGATIVE       Component Value Date/Time   CHOL 132 06/13/2020 2115   TRIG 61 06/13/2020 2115   HDL 73  06/13/2020 2115   CHOLHDL 1.8 06/13/2020 2115   VLDL 12 06/13/2020 2115   LDLCALC 47 06/13/2020 2115   Lab Results  Component Value Date   HGBA1C 5.9 (H) 04/28/2020      Component Value Date/Time   LABOPIA NONE DETECTED 06/15/2020 0355   COCAINSCRNUR NONE DETECTED 06/15/2020 0355   LABBENZ NONE DETECTED 06/15/2020 0355   AMPHETMU NONE DETECTED 06/15/2020 0355   THCU NONE DETECTED 06/15/2020 0355   LABBARB NONE DETECTED 06/15/2020 0355    No results for input(s): ETH in the last 168 hours.  I have personally reviewed the radiological images below and agree with the radiology interpretations.  DG Chest 1 View  Result Date: 06/14/2020 CLINICAL DATA:  Vomiting.  Altered mental status. EXAM: CHEST  1 VIEW COMPARISON:  05/22/2020 FINDINGS: The heart is enlarged but stable from prior study. Both lungs are clear. The visualized skeletal structures are unremarkable. There are atherosclerotic changes of the thoracic aorta.  IMPRESSION: No active disease. Electronically Signed   By: Constance Holster M.D.   On: 06/14/2020 00:47   DG Abd 1 View  Result Date: 06/14/2020 CLINICAL DATA:  Altered mental status with nausea and vomiting. EXAM: ABDOMEN - 1 VIEW COMPARISON:  05/23/2020 FINDINGS: The bowel gas pattern is normal. No radio-opaque calculi or other significant radiographic abnormality are seen. IMPRESSION: Negative. Electronically Signed   By: Constance Holster M.D.   On: 06/14/2020 00:48   DG Abd 1 View  Result Date: 05/23/2020 CLINICAL DATA:  Abdominal pain, mid to lower EXAM: ABDOMEN - 1 VIEW COMPARISON:  None. FINDINGS: No dilated small bowel loops. Mild colonic stool. No evidence of pneumatosis or pneumoperitoneum. Surgical clips are seen in the right upper quadrant of the abdomen. Clear lung bases. Lower lumbar spondylosis. IMPRESSION: Nonobstructive bowel gas pattern. Electronically Signed   By: Ilona Sorrel M.D.   On: 05/23/2020 11:22   MR ANGIO HEAD WO CONTRAST  Result Date:  06/13/2020 CLINICAL DATA:  Initial evaluation for neuro deficit, stroke suspected. History of recent right ACA territory infarct. EXAM: MRI HEAD WITHOUT AND WITH CONTRAST MRA HEAD WITHOUT CONTRAST TECHNIQUE: Multiplanar, multiecho pulse sequences of the brain and surrounding structures were obtained without and with intravenous contrast. Angiographic images of the head were obtained using MRA technique without contrast. CONTRAST:  6.7m GADAVIST GADOBUTROL 1 MMOL/ML IV SOLN COMPARISON:  Comparison made with CT from earlier same day as well as previous CTA and MRI from 04/27/2020. FINDINGS: MRI HEAD FINDINGS Brain: Diffuse prominence of the CSF containing spaces compatible with generalized age-related cerebral atrophy. Patchy and confluent T2/FLAIR hyperintensity within the periventricular and deep white matter both cerebral hemispheres most consistent with chronic small vessel ischemic disease, advanced in nature. Patchy involvement of the deep gray nuclei, brainstem, and cerebellum noted as well. Few scattered remote lacunar infarcts present about the hemispheric cerebral white matter and thalami. 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 Associated enhancement seen following contrast administration. No associated hemorrhage or significant mass effect. Few additional scattered foci of restricted diffusion involving the subcortical posterior left frontal region, right periatrial white matter, and splenium of the corpus callosum also consistent with acute to subacute ischemic infarcts. No associated hemorrhage. These findings are new as compared to prior exam. 1.4 x 2.3 x 1.7 cm well-circumscribed lobulated T1/FLAIR hyperintense lesion seen involving the left cerebellum (series 4, image 7). Lesion demonstrates  fairly avid root restricted diffusion. No significant associated enhancement or regional mass effect. Finding favored to reflect an evolving subacute hematoma, new as compared to prior MRI. Finding is difficult to see on prior CT performed earlier the same day. No other findings to suggest acute or subacute intracranial hemorrhage. Multiple additional chronic microhemorrhages noted, predominantly clustered about the thalami, favored to be related to chronic underlying poorly controlled hypertension given distribution. This additional finding suggests that the left cerebellar hematoma may be hypertensive in nature as well. No mass lesion, mass effect, or midline shift. No hydrocephalus or extra-axial fluid collection. No other abnormal enhancement. Pituitary gland and suprasellar region within normal limits. Midline structures intact. Vascular: Major intracranial vascular flow voids are maintained. Skull and upper cervical spine: Craniocervical junction within normal limits. Bone marrow signal intensity normal. No scalp soft tissue abnormality. Sinuses/Orbits: Globes and orbital soft tissues within normal limits. Left maxillary sinus retention cyst. Paranasal  sinuses are otherwise clear. No mastoid effusion. Other: None. MRA HEAD FINDINGS ANTERIOR CIRCULATION: Examination moderately degraded by motion artifact. Visualized distal cervical segments of the internal carotid arteries are patent with symmetric antegrade flow. Petrous segments patent bilaterally. Scattered atheromatous change noted throughout the carotid siphons, grossly similar to previous CTA. A1 segments patent bilaterally. Normal anterior communicating artery complex. Left ACA widely patent. Right A2 segment hypoplastic and diminutive, but remains grossly patent. M1 segments patent bilaterally. Normal MCA bifurcations. Distal MCA branches well perfused and symmetric. POSTERIOR CIRCULATION: Vertebral arteries patent to the vertebrobasilar junction  without stenosis. Left PICA patent at its origin. Right PICA not seen. Basilar grossly patent to its distal aspect without appreciable stenosis. Superior cerebral arteries patent bilaterally. Both PCAs primarily supplied via the basilar. Scattered atheromatous change about the PCAs bilaterally without occlusion, grossly similar to previous. No appreciable aneurysm. IMPRESSION: MRI HEAD IMPRESSION: 1. 1.4 x 2.3 x 1.7 cm well-circumscribed lobulated T1/FLAIR hyperintense lesion involving the left cerebellum, favored to reflect an evolving subacute hematoma, new as compared to prior MRI. No significant regional mass effect. Finding suspected to be hypertensive in nature. 2. Interval expansion and enlargement of previously identified right ACA territory infarct, now moderate in size and acute to subacute in appearance. No significant mass effect. 3. Few additional scattered acute to subacute ischemic nonhemorrhagic infarcts involving the subcortical posterior left frontal region, right periatrial white matter, and splenium of the corpus callosum. 4. Underlying age-related cerebral atrophy with advanced chronic microvascular ischemic disease. 5. Multiple chronic microhemorrhages, predominantly clustered about the thalami, favored to be related to chronic poorly controlled hypertension given distribution. 6. Underlying atrophy with advanced chronic small vessel ischemic disease. MRA HEAD IMPRESSION: 1. Motion degraded exam. 2. Grossly stable appearance of the intracranial circulation. No large vessel occlusion. Scattered atheromatous change about the carotid siphons, right ACA, and PCAs. No definite new or progressive finding. Electronically Signed   By: Jeannine Boga M.D.   On: 06/13/2020 23:29   MR BRAIN W WO CONTRAST  Result Date: 06/13/2020 CLINICAL DATA:  Initial evaluation for neuro deficit, stroke suspected. History of recent right ACA territory infarct. EXAM: MRI HEAD WITHOUT AND WITH CONTRAST MRA HEAD  WITHOUT CONTRAST TECHNIQUE: Multiplanar, multiecho pulse sequences of the brain and surrounding structures were obtained without and with intravenous contrast. Angiographic images of the head were obtained using MRA technique without contrast. CONTRAST:  6.63m GADAVIST GADOBUTROL 1 MMOL/ML IV SOLN COMPARISON:  Comparison made with CT from earlier same day as well as previous CTA and MRI from 04/27/2020. FINDINGS: MRI HEAD FINDINGS Brain: Diffuse prominence of the CSF containing spaces compatible with generalized age-related cerebral atrophy. Patchy and confluent T2/FLAIR hyperintensity within the periventricular and deep white matter both cerebral hemispheres most consistent with chronic small vessel ischemic disease, advanced in nature. Patchy involvement of the deep gray nuclei, brainstem, and cerebellum noted as well. Few scattered remote lacunar infarcts present about the hemispheric cerebral white matter and thalami. 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 Associated enhancement seen following contrast administration. No associated hemorrhage or significant mass effect. Few additional scattered foci of restricted diffusion involving the subcortical posterior left frontal region, right periatrial white matter, and splenium of the corpus callosum also consistent with acute to subacute ischemic infarcts. No associated hemorrhage. These findings are new as compared to prior exam. 1.4 x  2.3 x 1.7 cm well-circumscribed lobulated T1/FLAIR hyperintense lesion seen involving the left cerebellum (series 4, image 7). Lesion demonstrates fairly avid root restricted diffusion. No significant associated enhancement or regional mass effect. Finding favored to reflect an evolving subacute hematoma, new as compared to prior  MRI. Finding is difficult to see on prior CT performed earlier the same day. No other findings to suggest acute or subacute intracranial hemorrhage. Multiple additional chronic microhemorrhages noted, predominantly clustered about the thalami, favored to be related to chronic underlying poorly controlled hypertension given distribution. This additional finding suggests that the left cerebellar hematoma may be hypertensive in nature as well. No mass lesion, mass effect, or midline shift. No hydrocephalus or extra-axial fluid collection. No other abnormal enhancement. Pituitary gland and suprasellar region within normal limits. Midline structures intact. Vascular: Major intracranial vascular flow voids are maintained. Skull and upper cervical spine: Craniocervical junction within normal limits. Bone marrow signal intensity normal. No scalp soft tissue abnormality. Sinuses/Orbits: Globes and orbital soft tissues within normal limits. Left maxillary sinus retention cyst. Paranasal sinuses are otherwise clear. No mastoid effusion. Other: None. MRA HEAD FINDINGS ANTERIOR CIRCULATION: Examination moderately degraded by motion artifact. Visualized distal cervical segments of the internal carotid arteries are patent with symmetric antegrade flow. Petrous segments patent bilaterally. Scattered atheromatous change noted throughout the carotid siphons, grossly similar to previous CTA. A1 segments patent bilaterally. Normal anterior communicating artery complex. Left ACA widely patent. Right A2 segment hypoplastic and diminutive, but remains grossly patent. M1 segments patent bilaterally. Normal MCA bifurcations. Distal MCA branches well perfused and symmetric. POSTERIOR CIRCULATION: Vertebral arteries patent to the vertebrobasilar junction without stenosis. Left PICA patent at its origin. Right PICA not seen. Basilar grossly patent to its distal aspect without appreciable stenosis. Superior cerebral arteries patent bilaterally.  Both PCAs primarily supplied via the basilar. Scattered atheromatous change about the PCAs bilaterally without occlusion, grossly similar to previous. No appreciable aneurysm. IMPRESSION: MRI HEAD IMPRESSION: 1. 1.4 x 2.3 x 1.7 cm well-circumscribed lobulated T1/FLAIR hyperintense lesion involving the left cerebellum, favored to reflect an evolving subacute hematoma, new as compared to prior MRI. No significant regional mass effect. Finding suspected to be hypertensive in nature. 2. Interval expansion and enlargement of previously identified right ACA territory infarct, now moderate in size and acute to subacute in appearance. No significant mass effect. 3. Few additional scattered acute to subacute ischemic nonhemorrhagic infarcts involving the subcortical posterior left frontal region, right periatrial white matter, and splenium of the corpus callosum. 4. Underlying age-related cerebral atrophy with advanced chronic microvascular ischemic disease. 5. Multiple chronic microhemorrhages, predominantly clustered about the thalami, favored to be related to chronic poorly controlled hypertension given distribution. 6. Underlying atrophy with advanced chronic small vessel ischemic disease. MRA HEAD IMPRESSION: 1. Motion degraded exam. 2. Grossly stable appearance of the intracranial circulation. No large vessel occlusion. Scattered atheromatous change about the carotid siphons, right ACA, and PCAs. No definite new or progressive finding. Electronically Signed   By: Jeannine Boga M.D.   On: 06/13/2020 23:29   MR PELVIS WO CONTRAST  Result Date: 05/28/2020 CLINICAL DATA:  Pelvic pain. EXAM: MRI PELVIS WITHOUT CONTRAST TECHNIQUE: Multiplanar multisequence MR imaging of the pelvis was performed. No intravenous contrast was administered. COMPARISON:  None. FINDINGS: Bones: No hip fracture, dislocation or avascular necrosis. No periosteal reaction or bone destruction. No aggressive osseous lesion. Normal sacrum and  sacroiliac joints. No SI joint widening or erosive changes. Degenerative disc disease with disc height loss at L5-S1. minimal grade  1 anterolisthesis of L4 on L5 secondary to moderate bilateral facet arthropathy. Bilateral foraminal narrowing at L4-5 with mild spinal stenosis. Articular cartilage and labrum Articular cartilage:  No chondral defect. Labrum: Grossly intact, but evaluation is limited by lack of intraarticular fluid. Joint or bursal effusion Joint effusion:  No hip joint effusion.  No SI joint effusion. Bursae:  No bursa formation. Muscles and tendons Flexors: Normal. Extensors: Normal. Abductors: Normal. Adductors: Mild muscle edema in the left adductor musculature likely reflecting muscle strain versus myositis. Gluteals: Mild muscle edema in the gluteus minimus and medius muscle bilaterally which may reflect mild myositis or muscle strain, left greater than right. Hamstrings: Normal. Other findings No pelvic free fluid. No fluid collection or hematoma. No inguinal lymphadenopathy. No inguinal hernia. Soft tissue edema in the subcutaneous fat superficial to the gluteus maximus muscle bilaterally likely reflecting contusions or fat necrosis. IMPRESSION: 1. No hip fracture, dislocation or avascular necrosis. 2. Mild muscle edema in the left adductor musculature likely reflecting muscle strain versus myositis. 3. Mild muscle edema in the gluteus minimus and medius muscle bilaterally which may reflect mild myositis or muscle strain, left greater than right. 4. Soft tissue edema in the subcutaneous fat superficial to the gluteus maximus muscle bilaterally likely reflecting contusions or fat necrosis. Electronically Signed   By: Kathreen Devoid   On: 05/28/2020 09:58   DG CHEST PORT 1 VIEW  Result Date: 05/22/2020 CLINICAL DATA:  Persistent cough EXAM: PORTABLE CHEST 1 VIEW COMPARISON:  09/19/2015 FINDINGS: No focal opacity or pleural effusion. Mild cardiomegaly. Aortic atherosclerosis. No pneumothorax.  Skin fold artifact over the right lower chest. IMPRESSION: No active disease.  Mild cardiomegaly. Electronically Signed   By: Donavan Foil M.D.   On: 05/22/2020 19:47   ECHOCARDIOGRAM COMPLETE  Result Date: 06/14/2020    ECHOCARDIOGRAM REPORT   Patient Name:   [name] Date of Exam: 06/14/2020 Medical Rec #:  [id number]           Height:       63.0 in Accession #:    [id number]          Weight:       145.0 lb Date of Birth:  07-04-1961           BSA:          1.687 m Patient Age:    20 years            BP:           190/105 mmHg Patient Gender: F                   HR:           75 bpm. Exam Location:  Inpatient Procedure: 2D Echo, Cardiac Doppler and Color Doppler Indications:    Stroke 434.91 / I163.9  History:        Patient has prior history of Echocardiogram examinations, most                 recent 04/27/2020. Stroke; Risk Factors:Hypertension.  Sonographer:    Bernadene Person RDCS Referring Phys: [id number] Cleaster Corin PATEL  Sonographer Comments: patient on right side IMPRESSIONS  1. Intracavitary gradient, peak velocity 2.38 m/s. Peak gradient 22.7 mmHg.Marland Kitchen Left ventricular ejection fraction, by estimation, is 60 to 65%. The left ventricle has normal function. The left ventricle has no regional wall motion abnormalities. There is mild concentric left ventricular hypertrophy. Left ventricular diastolic parameters are consistent with Grade I diastolic  dysfunction (impaired relaxation). Elevated left ventricular end-diastolic pressure.  2. Right ventricular systolic function is normal. The right ventricular size is normal. There is normal pulmonary artery systolic pressure.  3. Left atrial size was mildly dilated.  4. The mitral valve is normal in structure. No evidence of mitral valve regurgitation. No evidence of mitral stenosis.  5. The aortic valve is normal in structure. Aortic valve regurgitation is not visualized. No aortic stenosis is present.  6. The inferior vena cava is normal in size with  greater than 50% respiratory variability, suggesting right atrial pressure of 3 mmHg. FINDINGS  Left Ventricle: Intracavitary gradient, peak velocity 2.38 m/s. Peak gradient 22.7 mmHg. Left ventricular ejection fraction, by estimation, is 60 to 65%. The left ventricle has normal function. The left ventricle has no regional wall motion abnormalities. The left ventricular internal cavity size was normal in size. There is mild concentric left ventricular hypertrophy. Left ventricular diastolic parameters are consistent with Grade I diastolic dysfunction (impaired relaxation). Elevated left ventricular end-diastolic pressure. Right Ventricle: The right ventricular size is normal. No increase in right ventricular wall thickness. Right ventricular systolic function is normal. There is normal pulmonary artery systolic pressure. The tricuspid regurgitant velocity is 1.24 m/s, and  with an assumed right atrial pressure of 3 mmHg, the estimated right ventricular systolic pressure is 9.2 mmHg. Left Atrium: Left atrial size was mildly dilated. Right Atrium: Right atrial size was normal in size. Pericardium: There is no evidence of pericardial effusion. Mitral Valve: The mitral valve is normal in structure. No evidence of mitral valve regurgitation. No evidence of mitral valve stenosis. Tricuspid Valve: The tricuspid valve is normal in structure. Tricuspid valve regurgitation is trivial. No evidence of tricuspid stenosis. Aortic Valve: The aortic valve is normal in structure. Aortic valve regurgitation is not visualized. No aortic stenosis is present. Pulmonic Valve: The pulmonic valve was normal in structure. Pulmonic valve regurgitation is not visualized. No evidence of pulmonic stenosis. Aorta: The aortic root is normal in size and structure. Venous: The inferior vena cava is normal in size with greater than 50% respiratory variability, suggesting right atrial pressure of 3 mmHg. IAS/Shunts: No atrial level shunt detected by  color flow Doppler.  LEFT VENTRICLE PLAX 2D LVIDd:         4.88 cm  Diastology LVIDs:         3.47 cm  LV e' medial:    3.65 cm/s LV PW:         1.23 cm  LV E/e' medial:  15.3 LV IVS:        1.07 cm  LV e' lateral:   3.97 cm/s LVOT diam:     1.90 cm  LV E/e' lateral: 14.1 LV SV:         73 LV SV Index:   44 LVOT Area:     2.84 cm  RIGHT VENTRICLE RV S prime:     15.80 cm/s TAPSE (M-mode): 1.7 cm LEFT ATRIUM             Index       RIGHT ATRIUM           Index LA diam:        3.50 cm 2.08 cm/m  RA Area:     13.20 cm LA Vol (A2C):   59.9 ml 35.51 ml/m RA Volume:   29.70 ml  17.61 ml/m LA Vol (A4C):   40.1 ml 23.78 ml/m LA Biplane Vol: 50.2 ml 29.76 ml/m  AORTIC VALVE  LVOT Vmax:   155.00 cm/s LVOT Vmean:  121.000 cm/s LVOT VTI:    0.259 m  AORTA Ao Root diam: 2.70 cm Ao Asc diam:  2.60 cm MITRAL VALVE               TRICUSPID VALVE MV Area (PHT): 2.50 cm    TR Peak grad:   6.2 mmHg MV Decel Time: 303 msec    TR Vmax:        124.00 cm/s MV E velocity: 56.00 cm/s MV A velocity: 74.40 cm/s  SHUNTS MV E/A ratio:  0.75        Systemic VTI:  0.26 m                            Systemic Diam: 1.90 cm Skeet Latch MD Electronically signed by Skeet Latch MD Signature Date/Time: 06/14/2020/1:15:36 PM    Final    ECHO TEE  Result Date: 06/15/2020    TRANSESOPHOGEAL ECHO REPORT   Patient Name:   [name] Date of Exam: 06/15/2020 Medical Rec #:  [id number]           Height:       63.0 in Accession #:    [id number]          Weight:       145.0 lb Date of Birth:  May 24, 1961           BSA:          1.687 m Patient Age:    80 years            BP:           180/76 mmHg Patient Gender: F                   HR:           78 bpm. Exam Location:  Inpatient Procedure: Cardiac Doppler, Color Doppler and Transesophageal Echo Indications:     Stroke  History:         Patient has prior history of Echocardiogram examinations, most                  recent 06/14/2020. CHF; Risk Factors:Hypertension. H/O CVA. CKD.   Sonographer:     Clayton Lefort RDCS (AE) Referring Phys:  [place] Phys: Rudean Haskell MD PROCEDURE: After discussion of the risks and benefits of a TEE, an informed consent was obtained from the patient. The transesophogeal probe was passed without difficulty through the esophogus of the patient. Local oropharyngeal anesthetic was provided with Cetacaine. Sedation performed by different physician. The patient was monitored while under deep sedation. Anesthestetic sedation was provided intravenously by Anesthesiology: 151.87m of Propofol, 1077mof Lidocaine. Image quality was good. The patient developed no complications during the procedure. IMPRESSIONS  1. Left ventricular ejection fraction, by estimation, is 60 to 65%. The left ventricle has normal function. The left ventricle has no regional wall motion abnormalities. Left ventricular diastolic function could not be evaluated.  2. Right ventricular systolic function is normal. The right ventricular size is normal.  3. Left atrial size was mildly dilated. No left atrial/left atrial appendage thrombus was detected.  4. The mitral valve is normal in structure. Mild mitral valve regurgitation.  5. The aortic valve is tricuspid. Aortic valve regurgitation is not visualized.  6. There is mild (Grade II) plaque.  7. Agitated saline contrast bubble study was negative, with no  evidence of any interatrial shunt. Conclusion(s)/Recommendation(s): No Evidence of intracardiac thrombus. There is some evidence of concentric hypertrophy in the mid and apex without apical aneurysm. Consider outpatient, TTE with contrast, in follow up if clinically indicated. FINDINGS  Left Ventricle: There is evidence of midventricular and apical hypertrophy, best observed in deep gastric imaging. No denfinitive evidence for apical hyperrophy (HCM Variant). Left ventricular ejection fraction, by estimation, is 60 to 65%. The left ventricle has normal function. The left  ventricle has no regional wall motion abnormalities. The left ventricular internal cavity size was normal in size. Left ventricular diastolic function could not be evaluated. Right Ventricle: The right ventricular size is normal. No increase in right ventricular wall thickness. Right ventricular systolic function is normal. Left Atrium: Left atrial size was mildly dilated. No left atrial/left atrial appendage thrombus was detected. Right Atrium: Prominent Eustachain ridge. Right atrial size was normal in size. Pericardium: Trivial pericardial effusion is present. Mitral Valve: The mitral valve is normal in structure. Mild mitral valve regurgitation. Tricuspid Valve: The tricuspid valve is normal in structure. Tricuspid valve regurgitation is trivial. Aortic Valve: The aortic valve is tricuspid. Aortic valve regurgitation is not visualized. Pulmonic Valve: The pulmonic valve was grossly normal. Pulmonic valve regurgitation is not visualized. Aorta: The aortic root and ascending aorta are structurally normal, with no evidence of dilitation. There is mild (Grade II) plaque. IAS/Shunts: No atrial level shunt detected by color flow Doppler. Agitated saline contrast was given intravenously to evaluate for intracardiac shunting. Agitated saline contrast bubble study was negative, with no evidence of any interatrial shunt.  LEFT VENTRICLE PLAX 2D LVOT diam:     2.10 cm LVOT Area:     3.46 cm   AORTA Ao Root diam: 2.80 cm Ao Asc diam:  2.90 cm  SHUNTS Systemic Diam: 2.10 cm Rudean Haskell MD Electronically signed by Rudean Haskell MD Signature Date/Time: 06/15/2020/1:18:01 PM    Final    DG HIP UNILAT WITH PELVIS 2-3 VIEWS LEFT  Result Date: 05/18/2020 CLINICAL DATA:  Left groin pain. EXAM: DG HIP (WITH OR WITHOUT PELVIS) 2-3V LEFT COMPARISON:  None. FINDINGS: There is no evidence of hip fracture or dislocation. There is no evidence of arthropathy or other focal bone abnormality. IMPRESSION: Negative.  Electronically Signed   By: Kathreen Devoid   On: 05/18/2020 10:39   CT HEAD CODE STROKE WO CONTRAST  Result Date: 06/13/2020 CLINICAL DATA:  Code stroke. Initial evaluation for acute left facial droop, slurred speech. Patient has a history of a recent right ACA territory infarct. EXAM: CT HEAD WITHOUT CONTRAST TECHNIQUE: Contiguous axial images were obtained from the base of the skull through the vertex without intravenous contrast. COMPARISON:  Prior CT from 09/19/2015. Additionally, there is a previous brain MRI from 04/27/2020, reviewed in epic, but not available for review in PACS. FINDINGS: Brain: Generalized age-related cerebral atrophy. Patchy and confluent hypodensity involving the periventricular deep white matter of both cerebral hemispheres most consistent with chronic small vessel ischemic disease. Patchy involvement of the deep gray nuclei, brainstem, and cerebellum noted as well. Appearance is fairly advanced in nature. Few scattered remote lacunar infarcts noted about the bilateral thalami. There is cortical and subcortical hypodensity involving the parasagittal right frontoparietal region, consistent with right ACA territory ischemia. Finding corresponds with previously identified right ACA territory infarct. However, appearance is somewhat more prominent on current exam as compared to previous brain MRI, raising the possibility for possible interval expansion and/or new acute component. Punctate hyperdensity at the posterior  aspect of this area of ischemia suspicious for a small focus of associated petechial hemorrhage (series 3, image 23). No frank hemorrhagic transformation or mass effect. No other evidence for acute large vessel territory infarct. Gray-white matter differentiation otherwise grossly maintained. No mass lesion or midline shift. No hydrocephalus or extra-axial fluid collection. Vascular: No hyperdense vessel. Scattered vascular calcifications noted within the carotid siphons.  Skull: Scalp soft tissues and calvarium within normal limits. Sinuses/Orbits: Globes and orbital soft tissues demonstrate no acute finding. Left maxillary sinus retention cyst noted. Paranasal sinuses are otherwise largely clear. No mastoid effusion. Other: Poor dentition noted. ASPECTS [facility] Stroke Program Early CT Score) - Ganglionic level infarction (caudate, lentiform nuclei, internal capsule, insula, M1-M3 cortex): 7 - Supraganglionic infarction (M4-M6 cortex): 3 Total score (0-10 with 10 being normal): 10 IMPRESSION: 1. Cortical and subcortical hypodensity involving the parasagittal right frontoparietal region, consistent with previously identified right ACA territory infarct. Appearance is somewhat more prominent on current exam as compared to previous brain MRI, raising the possibility for possible interval expansion and/or new acute component. Punctate hyperdensity at the posterior aspect of this area of ischemia suspicious for a small focus of associated petechial hemorrhage. No frank hemorrhagic transformation or mass effect. 2. ASPECTS is 10. 3. 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 These results were communicated to Dr. Rory Percy at 9:35 pmon 10/4/2021by text page via the [facility] messaging system. Findings also discussed by telephone with Dr. Rory Percy at time of dictation. Electronically Signed   By: Jeannine Boga M.D.   On: 06/13/2020 21:49   VAS [place] LOWER EXTREMITY VENOUS (DVT)  Result Date: 06/14/2020  Lower Venous DVTStudy Indications: Stroke.  Comparison Study: no prior Performing Technologist: Abram Sander RVS  Examination Guidelines: A complete evaluation includes B-mode imaging, spectral Doppler, color Doppler, and power Doppler as needed of all accessible portions of each vessel. Bilateral testing is considered an integral part of a complete examination. Limited examinations for reoccurring indications may be performed as noted. The  reflux portion of the exam is performed with the patient in reverse Trendelenburg.  +---------+---------------+---------+-----------+----------+--------------+ RIGHT    CompressibilityPhasicitySpontaneityPropertiesThrombus Aging +---------+---------------+---------+-----------+----------+--------------+ CFV      Full           Yes      Yes                                 +---------+---------------+---------+-----------+----------+--------------+ SFJ      Full                                                        +---------+---------------+---------+-----------+----------+--------------+ FV Prox  Full                                                        +---------+---------------+---------+-----------+----------+--------------+ FV Mid   Full                                                        +---------+---------------+---------+-----------+----------+--------------+  FV DistalFull                                                        +---------+---------------+---------+-----------+----------+--------------+ PFV      Full                                                        +---------+---------------+---------+-----------+----------+--------------+ POP      Full           Yes      Yes                                 +---------+---------------+---------+-----------+----------+--------------+ PTV      Full                                                        +---------+---------------+---------+-----------+----------+--------------+ PERO     Full                                                        +---------+---------------+---------+-----------+----------+--------------+   +---------+---------------+---------+-----------+----------+--------------+ LEFT     CompressibilityPhasicitySpontaneityPropertiesThrombus Aging +---------+---------------+---------+-----------+----------+--------------+ CFV      Full           Yes       Yes                                 +---------+---------------+---------+-----------+----------+--------------+ SFJ      Full                                                        +---------+---------------+---------+-----------+----------+--------------+ FV Prox  Full                                                        +---------+---------------+---------+-----------+----------+--------------+ FV Mid   Full                                                        +---------+---------------+---------+-----------+----------+--------------+ FV DistalFull                                                        +---------+---------------+---------+-----------+----------+--------------+  PFV      Full                                                        +---------+---------------+---------+-----------+----------+--------------+ POP      Full           Yes      Yes                                 +---------+---------------+---------+-----------+----------+--------------+ PTV      Full                                                        +---------+---------------+---------+-----------+----------+--------------+ PERO     Full                                                        +---------+---------------+---------+-----------+----------+--------------+     Summary: BILATERAL: - No evidence of deep vein thrombosis seen in the lower extremities, bilaterally. - No evidence of superficial venous thrombosis in the lower extremities, bilaterally. -   *See table(s) above for measurements and observations. Electronically signed by Servando Snare MD on 06/14/2020 at 12:16:46 PM.    Final    DG FL GUIDED LUMBAR PUNCTURE  Result Date: 06/16/2020 CLINICAL DATA:  Cryptogenic stroke. EXAM: DIAGNOSTIC LUMBAR PUNCTURE UNDER FLUOROSCOPIC GUIDANCE FLUOROSCOPY TIME:  Fluoroscopy Time:  2 minutes and 6 seconds Radiation Exposure Index (if provided by the fluoroscopic device):  Not applicable. Number of Acquired Spot Images: 1 PROCEDURE: Informed consent was obtained from the patient prior to the procedure, including potential complications of headache, and pain. A "time out" was performed. With the patient prone, the lower back was prepped with Betadine. 1% Lidocaine was used for local anesthesia. Lumbar puncture was performed at the L3-4 level using a 20 gauge needle with return of clear CSF with an opening pressure of 20 cm water. 10 ml of CSF were obtained for laboratory studies. The patient tolerated the procedure well and there were no apparent complications. IMPRESSION: Non complicated lumbar puncture as detailed above. Electronically Signed   By: Abigail Miyamoto M.D.   On: 06/16/2020 10:21    PHYSICAL EXAM   Temp:  [97.4 F (36.3 C)-99.3 F (37.4 C)] 98.4 F (36.9 C) (10/07 1100) Pulse Rate:  [74-91] 88 (10/07 1100) Resp:  [12-17] 16 (10/07 1100) BP: (151-179)/(77-91) 165/88 (10/07 1100) SpO2:  [99 %-100 %] 100 % (10/07 1100)  General - Well nourished, well developed, in no apparent distress.  Ophthalmologic - fundi not visualized due to noncooperation.  Cardiovascular - Regular rhythm and rate.  Mental Status -  Level of arousal and orientation to time, place, and person were intact. Language including expression, naming, repetition, comprehension was assessed and found intact. Fund of Knowledge was assessed and was intact.  Cranial Nerves II - XII - II - Visual field intact OU. III, IV, VI - Extraocular movements intact. V - Facial  sensation intact bilaterally. VII - left facial droop. VIII - Hearing & vestibular intact bilaterally. X - Palate elevates symmetrically. XI - Chin turning & shoulder shrug intact bilaterally. XII - Tongue protrusion to the left.  Motor Strength - The patient's strength was at least 4/5 in right upper and 3/5 lower extremities, however left upper extremity 1/5 proximal and 0/5 distal, right upper extremity no spontaneous  movement, slight withdraw to pain stimulation.  Bulk was normal and fasciculations were absent.   Motor Tone - Muscle tone was assessed at the neck and appendages and was normal.  Reflexes - The patient's reflexes were symmetrical in all extremities and she had no pathological reflexes.  Sensory - Light touch, temperature/pinprick were assessed and were symmetrical.    Coordination - The patient had normal movements in the right hand with no ataxia or dysmetria.  Tremor was absent.  Gait and Station - deferred.   ASSESSMENT/PLAN [name] is a 59 y.o. female with history of hypertension, stroke admitted for worsening left facial droop and slurred speech and left-sided weakness. No tPA given due to also window and recent stroke.    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 Large vessel stenosis/athero in the setting of BP fluctuation - MRA and CTA showed right ACA and bilateral PCA stenosis.  Patient current strokes correlating to stenosed vessel territories, concerning for BP related phenomena 2. 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 Autoimmune labs neg. CSF w/ 9 WBC and slightly low glucose 58, VDRL and crypto pending 4. Hypercoagulable state - will check hypercoagulable labs and pan-CT pending   CT head showed right ACA subacute infarct, larger than stroke in 04/2020  MRI Interval expansion and enlargement of previously identified right ACA territory infarct, now moderate in size and acute to subacute in appearance. Few additional scattered acute to subacute ischemic nonhemorrhagic infarcts involving the subcortical posterior left frontal region, right periatrial white  matter, and splenium of the corpus callosum.  MRA Scattered atheromatous change about the carotid siphons, right ACA, and PCAs   Pan CT pending   2D Echo EF 60 to 65%  TEE no LV or LAA thrombus, neg bubble. LVH.  LE venous Doppler negative  CSF w/ 9 WBC and slightly low glucose but normal protein - not consistent with CNS infection or inflammation  LDL 47  Autoimmune negative   Hypercoagulable work up pending  Lovenox for VTE prophylaxis - lovenox   aspirin 325 mg daily prior to admission, now on ASA 325 and plavix 75 DAPT for 3 months and then plavix alone given large vessel stenosis  Therapy recommendations: SNF  Disposition: Pending  Patient not interested in loop  Palliative care consulted  Subacute left cerebellum hemorrhage with CMBs  MRI showed 1.4 x 2.3 x 1.7 cm well-circumscribed lobulated T1/FLAIR hyperintense lesion involving the left cerebellum, favored to reflect an evolving subacute hematoma  MRI also showed multiple chronic microhemorrhages, predominantly clustered about the thalami, favored to be related to chronic poorly controlled hypertension given distribution.  TEE showed LVH also consistent with chronic HTN  BP goal < 180 for 24-48h, and then gradually normalize in 3 to 5 days.  History of stroke  Admitted in 04/2020 for left-sided weakness and slurred  speech.  MRI showed right ACA infarct.  CTA head and neck showed right A2 stenosis, bilateral P2 stenosis, bilateral siphon 50% stenosis, right more than left.  EF 60 to 65%.  TCD bubble study negative.  LDL 82, A1c 5.9.  Put on DAPT for 3 weeks as well as Lipitor 40.  Elevated ESR and CRP  ESR 75->67  CRP 47->4.0  Autoimmune labs negative  CSF not consistent with CNS vasculitis  Hypertensive urgency . Stable on the high end . On amlodipine 10, metoprolol 25 bid . resume hydralazine 50 Q8 . Gradually lower BP to goal within 3-5 days. . BP stable on high end  Long term BP goal 130-150  given multifocal intracranial stenosis  Hyperlipidemia  Home meds:  Lipitor 40   LDL 47, goal < 70  Now on Lipitor 40  Continue statin at discharge  Other Active Problems  OA  Hypokalemia, replete  Nausea/vomiting/constipation  CKD stage IIIa  Hospital day # 3   Rosalin Hawking, MD PhD Stroke Neurology 06/16/2020 2:36 PM    To contact Stroke Continuity provider, please refer to [email or web address] After hours, contact General Neurology

## 2020-06-17 ENCOUNTER — Inpatient Hospital Stay (HOSPITAL_COMMUNITY): Payer: 59

## 2020-06-17 ENCOUNTER — Encounter (HOSPITAL_COMMUNITY): Payer: Self-pay | Admitting: Internal Medicine

## 2020-06-17 DIAGNOSIS — Z7189 Other specified counseling: Secondary | ICD-10-CM

## 2020-06-17 DIAGNOSIS — I639 Cerebral infarction, unspecified: Secondary | ICD-10-CM

## 2020-06-17 DIAGNOSIS — Z515 Encounter for palliative care: Secondary | ICD-10-CM

## 2020-06-17 LAB — CBC WITH DIFFERENTIAL/PLATELET
Abs Immature Granulocytes: 0.02 10*3/uL (ref 0.00–0.07)
Basophils Absolute: 0.1 10*3/uL (ref 0.0–0.1)
Basophils Relative: 1 %
Eosinophils Absolute: 0.2 10*3/uL (ref 0.0–0.5)
Eosinophils Relative: 3 %
HCT: 31.4 % — ABNORMAL LOW (ref 36.0–46.0)
Hemoglobin: 10.3 g/dL — ABNORMAL LOW (ref 12.0–15.0)
Immature Granulocytes: 0 %
Lymphocytes Relative: 21 %
Lymphs Abs: 1.4 10*3/uL (ref 0.7–4.0)
MCH: 29 pg (ref 26.0–34.0)
MCHC: 32.8 g/dL (ref 30.0–36.0)
MCV: 88.5 fL (ref 80.0–100.0)
Monocytes Absolute: 0.8 10*3/uL (ref 0.1–1.0)
Monocytes Relative: 12 %
Neutro Abs: 4.4 10*3/uL (ref 1.7–7.7)
Neutrophils Relative %: 63 %
Platelets: 166 10*3/uL (ref 150–400)
RBC: 3.55 MIL/uL — ABNORMAL LOW (ref 3.87–5.11)
RDW: 14.6 % (ref 11.5–15.5)
WBC: 6.9 10*3/uL (ref 4.0–10.5)
nRBC: 0 % (ref 0.0–0.2)

## 2020-06-17 LAB — COMPREHENSIVE METABOLIC PANEL
ALT: 24 U/L (ref 0–44)
AST: 20 U/L (ref 15–41)
Albumin: 2.9 g/dL — ABNORMAL LOW (ref 3.5–5.0)
Alkaline Phosphatase: 71 U/L (ref 38–126)
Anion gap: 11 (ref 5–15)
BUN: 14 mg/dL (ref 6–20)
CO2: 21 mmol/L — ABNORMAL LOW (ref 22–32)
Calcium: 9.9 mg/dL (ref 8.9–10.3)
Chloride: 107 mmol/L (ref 98–111)
Creatinine, Ser: 1.24 mg/dL — ABNORMAL HIGH (ref 0.44–1.00)
GFR calc non Af Amer: 48 mL/min — ABNORMAL LOW (ref >60)
Glucose, Bld: 103 mg/dL — ABNORMAL HIGH (ref 70–99)
Potassium: 3.3 mmol/L — ABNORMAL LOW (ref 3.5–5.1)
Sodium: 139 mmol/L (ref 135–145)
Total Bilirubin: 0.5 mg/dL (ref 0.3–1.2)
Total Protein: 6.5 g/dL (ref 6.5–8.1)

## 2020-06-17 LAB — VDRL, CSF: VDRL Quant, CSF: NONREACTIVE

## 2020-06-17 LAB — RETICULOCYTES
Immature Retic Fract: 6.3 % (ref 2.3–15.9)
RBC.: 3.57 MIL/uL — ABNORMAL LOW (ref 3.87–5.11)
Retic Count, Absolute: 28.9 10*3/uL (ref 19.0–186.0)
Retic Ct Pct: 0.8 % (ref 0.4–3.1)

## 2020-06-17 LAB — PHOSPHORUS: Phosphorus: 3.7 mg/dL (ref 2.5–4.6)

## 2020-06-17 LAB — HOMOCYSTEINE: Homocysteine: 11 umol/L (ref 0.0–14.5)

## 2020-06-17 LAB — MAGNESIUM: Magnesium: 1.9 mg/dL (ref 1.7–2.4)

## 2020-06-17 LAB — IRON AND TIBC
Iron: 49 ug/dL (ref 28–170)
Saturation Ratios: 26 % (ref 10.4–31.8)
TIBC: 189 ug/dL — ABNORMAL LOW (ref 250–450)
UIBC: 140 ug/dL

## 2020-06-17 LAB — CARDIOLIPIN ANTIBODIES, IGG, IGM, IGA
Anticardiolipin IgA: <9 APL U/mL (ref 0–11)
Anticardiolipin IgG: <9 GPL U/mL (ref 0–14)
Anticardiolipin IgM: 12 MPL U/mL (ref 0–12)

## 2020-06-17 LAB — FERRITIN: Ferritin: 79 ng/mL (ref 11–307)

## 2020-06-17 LAB — FOLATE: Folate: 12.9 ng/mL (ref >5.9)

## 2020-06-17 LAB — VITAMIN B12: Vitamin B-12: 1342 pg/mL — ABNORMAL HIGH (ref 180–914)

## 2020-06-17 MED ORDER — IOHEXOL 300 MG/ML  SOLN
100.0000 mL | Freq: Once | INTRAMUSCULAR | Status: AC | PRN
  Administered 2020-06-17: 100 mL via INTRAVENOUS

## 2020-06-17 MED ORDER — METOPROLOL TARTRATE 50 MG PO TABS
50.0000 mg | ORAL_TABLET | Freq: Two times a day (BID) | ORAL | Status: DC
  Administered 2020-06-17 (×2): 50 mg via ORAL
  Filled 2020-06-17 (×2): qty 1

## 2020-06-17 MED ORDER — POTASSIUM CHLORIDE CRYS ER 20 MEQ PO TBCR
40.0000 meq | EXTENDED_RELEASE_TABLET | Freq: Two times a day (BID) | ORAL | Status: AC
  Administered 2020-06-17 (×2): 40 meq via ORAL
  Filled 2020-06-17 (×2): qty 2

## 2020-06-17 NOTE — Consult Note (Signed)
   [facility] CM Inpatient Consult   06/17/2020  Minerva Ends [facility] July 25, 1961 [id number]   [facility] [[facility]] Patient:  [facility]   Patient screened for 3 hospitalizations in the past 6 months to check if potential [facility] service needs.  Review of patient's medical record reveals patient is for a skilled nursing facility for rehab.  Primary Care Provider is No PCP on file noted.  Plan: Patient needs are to be met at the skilled level of care and no current [facility] Care Management needs for transition noted.  For questions contact:   Natividad Brood, RN BSN [facility] Liaison  [phone] business mobile phone Toll free office [phone]  Fax number: [phone] [email or web address]

## 2020-06-17 NOTE — Progress Notes (Signed)
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 MRI reveals expansion of acute vs subacute R ACA CVA, new embolic acute vs subacute CVAs in bilateral hemispheres, and chronic-appearing L cerebellar ICH either due to hemorrhage or conversion of ischemic CVA (new since 04/2020).    PT Comments    Pt progressing towards her goals by sitting for inc periods of time with improved independence and midline orientation. Utilized mirror placed anterior to pt during sitting balance, with noted improvement, progressing from mod-maxA to min guard and requiring B UE support to B forearms resting on thighs. Pt able to maintain static sitting in midline for up to ~2 min with min guard before requiring further assistance secondary to fatigue. Attempted LE therapeutic exercise, but pt unable to follow commands when directed and thus little to no muscle activation noted on the L with palpation during exercises, but muscle activation noted during functional mobility. Pt emotional in regards to her current situation. She will continue to benefit from acute PT services to further address her deficits to maximize her independence and safety with functional mobility.   Follow Up Recommendations  SNF;Supervision/Assistance - 24 hour     Equipment Recommendations  None recommended by PT    Recommendations for Other Services       Precautions / Restrictions Precautions Precautions: Fall Precaution Comments: L hemiplegia, L inattention, pushing tendency with RUE to weak L side Restrictions Weight Bearing Restrictions: No    Mobility  Bed Mobility Overal bed mobility: Needs Assistance Bed Mobility: Rolling;Sidelying to Sit;Sit to Supine Rolling: Max assist Sidelying to sit: Max  assist   Sit to supine: Total assist   General bed mobility comments: TC's and VC's for R hand placement on L bed rail to roll, with pt releasing due to unsustained attention to task and requiring cues to repeat. Sidelying to sit EOB and sit > supine with cues for R LE placement under L to assist, with success. MaxA to manage trunk with ascension. HOB elevated during bed mob.  Transfers                    Ambulation/Gait                 Stairs             Wheelchair Mobility    Modified Rankin (Stroke Patients Only) Modified Rankin (Stroke Patients Only) Pre-Morbid Rankin Score: Severe disability Modified Rankin: Severe disability     Balance Overall balance assessment: Needs assistance Sitting-balance support: Feet supported;Bilateral upper extremity supported;Single extremity supported;No upper extremity supported Sitting balance-Leahy Scale: Fair Sitting balance - Comments: Pt sits statically EOB for up to ~12 min before returning to supine due to fatigue. Mirror placed anterior to pt to cue her to find and maintain midline, with success. Progressed from B UE support on bed and mod-maxA to R UE support on bed with minA - min guard to no UE support on bed but rather forearms on thighs with min guard. Able to sustain midline orientation for up to ~2 min with min guard before needing inc assistance due to fatigue. Postural control: Posterior lean;Left lateral lean  Cognition Arousal/Alertness: Awake/alert Behavior During Therapy: WFL for tasks assessed/performed Overall Cognitive Status: History of cognitive impairments - at baseline Area of Impairment: Orientation;Attention;Memory;Following commands;Problem solving                 Orientation Level: Disoriented to;Time (did not know time of day) Current Attention Level: Sustained Memory: Decreased short-term memory Following Commands: Follows one step  commands consistently;Follows one step commands with increased time (required repeated cues) Safety/Judgement: Decreased awareness of safety;Decreased awareness of deficits   Problem Solving: Slow processing;Decreased initiation;Difficulty sequencing;Requires verbal cues;Requires tactile cues General Comments: Pt repeatedly stated "thank you" when provided VC's to correct her posture. Required repetitive cues and extra time to remain on task. Pt unable to follow cues to perform some ther ex resulting in no muscle activation during attempts, but muscle activation noted with mobility.      Exercises General Exercises - Lower Extremity Quad Sets: Both;5 reps;Supine (No muscle activation when cued, but spontaneous w/ mobility) Gluteal Sets: Both;5 reps;Supine (bridging, min muscle activation R and no-little L; maxA) Heel Slides: 5 reps;Both;Supine (No muscle act L LE when cued, but spontaneous w/ mobility) Hip ABduction/ADduction: 5 reps;Both;Supine (No muscle act L LE when cued, but spontaneous w/ mobility) Straight Leg Raises: 5 reps;Both;Supine (No muscle act L LE when cued, but spontaneous w/ mobility)    General Comments        Pertinent Vitals/Pain Pain Assessment: 0-10 Pain Score: 7  Pain Location: L arm, leg Pain Descriptors / Indicators: Sore;Grimacing Pain Intervention(s): Limited activity within patient's tolerance;Monitored during session (reported receiving pain meds recently)    Home Living                      Prior Function            PT Goals (current goals can now be found in the care plan section) Acute Rehab PT Goals Patient Stated Goal: to get better and go home PT Goal Formulation: With patient Time For Goal Achievement: 06/28/20 Potential to Achieve Goals: Fair Progress towards PT goals: Progressing toward goals    Frequency    Min 3X/week      PT Plan Current plan remains appropriate    Co-evaluation     PT goals addressed during  session: Mobility/safety with mobility;Strengthening/ROM;Balance        AM-PAC PT "6 Clicks" Mobility   Outcome Measure  Help needed turning from your back to your side while in a flat bed without using bedrails?: A Lot Help needed moving from lying on your back to sitting on the side of a flat bed without using bedrails?: A Lot Help needed moving to and from a bed to a chair (including a wheelchair)?: Total Help needed standing up from a chair using your arms (e.g., wheelchair or bedside chair)?: Total Help needed to walk in hospital room?: Total Help needed climbing 3-5 steps with a railing? : Total 6 Click Score: 8    End of Session Equipment Utilized During Treatment: Other (comment) (mirror) Activity Tolerance: Patient limited by fatigue;Patient limited by pain Patient left: in bed;with bed alarm set;with call bell/phone within reach   PT Visit Diagnosis: Hemiplegia and hemiparesis;Muscle weakness (generalized) (M62.81);Other symptoms and signs involving the nervous system (R29.898);Pain Hemiplegia - Right/Left: Left Hemiplegia - dominant/non-dominant: Non-dominant Hemiplegia - caused by: Cerebral infarction Pain - Right/Left: Left Pain - part of body: Arm;Leg     Time: 1131-1202 PT Time Calculation (min) (ACUTE ONLY): 31 min  Charges:  $  Therapeutic Exercise: 8-22 mins $Neuromuscular Re-education: 8-22 mins                     Moishe Spice, PT, DPT Acute Rehabilitation Services  Pager: [phone] Office: [place] 06/17/2020, 12:33 PM

## 2020-06-17 NOTE — Plan of Care (Signed)

## 2020-06-17 NOTE — Consult Note (Signed)
Consultation Note Date: 06/17/2020   Patient Name: [name]  DOB: Dec 30, 1960  MRN: [id number]  Age / Sex: 59 y.o., female  PCP: No primary care provider on file. Referring Physician: Kerney Elbe, DO  Reason for Consultation: Establishing goals of care and Psychosocial/spiritual support  HPI/Patient Profile: 59 y.o. female  with past medical history of recent stroke August 18 of this year involving right ACA territory with residual left-sided hemiparesis and dysarthria felt to be embolic due to unknown source, HTN-uncontrolled,CKD 3, not on medication, arthritis admitted on 06/13/2020 with acute/subacute right ACA stroke/subacute to chronic left cerebral ICH, multiple acute subacute embolic strokes in bilateral cerebral hemispheres.  She went to inpatient rehab after August stroke and then return to her own home.  Clinical Assessment and Goals of Care: I have reviewed medical records including EPIC notes, labs and imaging, received report from transition of care team, examined the patient and met at bedside.  [name] is sitting up in bed with her meal tray in front of her.  She will make an somewhat keep eye contact.  She appears quite frail/is hemiplegic with left sided weakness.  She is able to make her basic needs known.  There is no family at bedside at this time.  Chart review completed.[name] lives with her 47 year old son and 2 adult daughters, and her significant other, [name].  She had been independent prior to her stroke August of this year, working at [employer].  As far as functional and nutritional status, she has been wheelchair-bound since her stroke in August of this year.  Call to daughter, [name] to discuss diagnosis prognosis, [place], EOL wishes, disposition and options.   No answer, unable to leave voicemail message  Would definitely benefit from outpatient  palliative Care services.  Conference with attending, neurology, bedside nursing staff, transition of care team related to patient condition, needs, goals of care, outpatient services.  PMT to continue to follow.   HCPOA   NEXT OF KIN -at this point main contact is daughter, [name]    SUMMARY OF RECOMMENDATIONS   At this point continue full scope/full code. CODE STATUS discussions Short-term rehab if approved by insurance Outpatient palliative to follow at short-term rehab/home  Code Status/Advance Care Planning:  Full code -PMT unable to conduct CODE STATUS discussions with patient.  Unable to reach daughter for CODE STATUS discussions.  Inpatient PMT will continue to reach out for goalsetting and CODE STATUS discussions.  Outpatient palliative would be able to continue these conversations.  Symptom Management:   Per hospitalist, no additional needs at this time  Palliative Prophylaxis:   Oral Care and Turn Reposition  Additional Recommendations (Limitations, Scope, Preferences):  Full Scope Treatment  Psycho-social/Spiritual:   Desire for further Chaplaincy support:no  Additional Recommendations: Caregiving  Support/Resources and Education on Hospice  Prognosis:   Unable to determine, based on outcomes. 1 year or less would not be surprising based on hemiplegia, chronic illness burden.   Discharge Planning: attempting STR, but likley will  need LTC in future       Primary Diagnoses: Present on Admission: . Acute CVA (cerebrovascular accident) ([facility]) . CKD (chronic kidney disease) stage 3, GFR 30-59 ml/min ([facility]) . Hypertensive emergency   I have reviewed the medical record, interviewed the patient and family, and examined the patient. The following aspects are pertinent.  Past Medical History:  Diagnosis Date  . Arthritis   . CKD (chronic kidney disease) stage 3, GFR 30-59 ml/min (HCC)   . CVA (cerebral vascular accident) ([place])   . Hypertension    . Hypertension    not taking medications   Social History   Socioeconomic History  . Marital status: Single    Spouse name: Not on file  . Number of children: Not on file  . Years of education: Not on file  . Highest education level: Not on file  Occupational History  . Occupation: works on loading dock  Tobacco Use  . Smoking status: Never Smoker  . Smokeless tobacco: Never Used  Vaping Use  . Vaping Use: Never used  Substance and Sexual Activity  . Alcohol use: Not Currently  . Drug use: Never  . Sexual activity: Not on file  Other Topics Concern  . Not on file  Social History Narrative   ** Merged History Encounter **       Social Determinants of Health   Financial Resource Strain:   . Difficulty of Paying Living Expenses: Not on file  Food Insecurity:   . Worried About Charity fundraiser in the Last Year: Not on file  . Ran Out of Food in the Last Year: Not on file  Transportation Needs:   . Lack of Transportation (Medical): Not on file  . Lack of Transportation (Non-Medical): Not on file  Physical Activity:   . Days of Exercise per Week: Not on file  . Minutes of Exercise per Session: Not on file  Stress:   . Feeling of Stress : Not on file  Social Connections:   . Frequency of Communication with Friends and Family: Not on file  . Frequency of Social Gatherings with Friends and Family: Not on file  . Attends Religious Services: Not on file  . Active Member of Clubs or Organizations: Not on file  . Attends Archivist Meetings: Not on file  . Marital Status: Not on file   Family History  Problem Relation Age of Onset  . Cancer Mother   . Hypertension Father   . Heart disease Father   . Heart disease Brother   . Stroke Neg Hx    Scheduled Meds: .  stroke: mapping our early stages of recovery book   Does not apply Once  . amLODipine  10 mg Oral Daily  . aspirin EC  325 mg Oral Daily  . atorvastatin  40 mg Oral Daily  . clopidogrel  75 mg  Oral Daily  . enoxaparin (LOVENOX) injection  40 mg Subcutaneous Q24H  . ferrous sulfate  325 mg Oral BID WC  . FLUoxetine  10 mg Oral Daily  . hydrALAZINE  50 mg Oral Q8H  . isosorbide mononitrate  60 mg Oral Daily  . lidocaine  5 mL Intradermal Once  . metoprolol tartrate  50 mg Oral BID  . potassium chloride  40 mEq Oral BID   Continuous Infusions: PRN Meds:.acetaminophen **OR** acetaminophen (TYLENOL) oral liquid 160 mg/5 mL **OR** acetaminophen, hydrALAZINE, labetalol, senna-docusate Medications Prior to Admission:  Prior to Admission medications   Medication  Sig Start Date End Date Taking? Authorizing Provider  acetaminophen (TYLENOL) 325 MG tablet Take 2 tablets (650 mg total) by mouth every 4 (four) hours as needed for mild pain (or temp > 37.5 C (99.5 F)). 05/31/20  Yes Angiulli, Lavon Paganini, PA-C  amantadine (SYMMETREL) 50 MG/5ML solution Take 10 mLs (100 mg total) by mouth 2 (two) times daily with breakfast and lunch. 05/31/20  Yes Angiulli, Lavon Paganini, PA-C  amLODipine (NORVASC) 10 MG tablet Take 1 tablet (10 mg total) by mouth daily. 05/31/20  Yes Angiulli, Lavon Paganini, PA-C  aspirin 325 MG tablet Take 1 tablet (325 mg total) by mouth daily. Patient taking differently: Take 81 mg by mouth daily.  05/31/20  Yes Angiulli, Lavon Paganini, PA-C  atorvastatin (LIPITOR) 40 MG tablet Take 1 tablet (40 mg total) by mouth daily. 05/31/20  Yes Angiulli, Lavon Paganini, PA-C  baclofen (LIORESAL) 10 MG tablet Take 1 tablet (10 mg total) by mouth 2 (two) times daily. 05/31/20  Yes Angiulli, Lavon Paganini, PA-C  clonazePAM (KLONOPIN) 0.5 MG tablet Take 0.5 tablets (0.25 mg total) by mouth 2 (two) times daily. 05/31/20 05/31/21 Yes Jamse Arn, MD  cloNIDine (CATAPRES) 0.2 MG tablet Take 1 tablet (0.2 mg total) by mouth 2 (two) times daily. 05/31/20  Yes Angiulli, Lavon Paganini, PA-C  diclofenac Sodium (VOLTAREN) 1 % GEL Apply 2 g topically 4 (four) times daily. 05/31/20  Yes Angiulli, Lavon Paganini, PA-C  ferrous sulfate 325 (65  FE) MG tablet Take 1 tablet (325 mg total) by mouth 2 (two) times daily with a meal. 05/31/20  Yes Angiulli, Lavon Paganini, PA-C  FLUoxetine (PROZAC) 10 MG capsule Take 1 capsule (10 mg total) by mouth daily. 05/31/20  Yes Angiulli, Lavon Paganini, PA-C  hydrALAZINE (APRESOLINE) 100 MG tablet Take 1 tablet (100 mg total) by mouth every 8 (eight) hours. 05/31/20  Yes Angiulli, Lavon Paganini, PA-C  isosorbide mononitrate (IMDUR) 60 MG 24 hr tablet Take 1 tablet (60 mg total) by mouth daily. 06/02/20  Yes Angiulli, Lavon Paganini, PA-C  melatonin 3 MG TABS tablet Take 1 tablet (3 mg total) by mouth at bedtime. 05/31/20  Yes Angiulli, Lavon Paganini, PA-C  meloxicam (MOBIC) 15 MG tablet Take 15 mg by mouth daily as needed for pain. 06/08/20  Yes [provider]  metoprolol tartrate (LOPRESSOR) 25 MG tablet Take 1 tablet (25 mg total) by mouth 2 (two) times daily. 05/31/20  Yes Angiulli, Lavon Paganini, PA-C  omeprazole (PRILOSEC) 40 MG capsule Take 1 capsule (40 mg total) by mouth 2 (two) times daily for 60 doses. Open up 1 capsule & sprinkle on food twice daily 05/31/20 06/30/20 Yes Patel, Domenick Bookbinder, MD  sucralfate (CARAFATE) 1 g tablet Take 1 tablet (1 g total) by mouth 4 (four) times daily. Mix 1 tablet with 10-9ms of water to make a slurry and take by mouth four times daily with meals and at bedtime 05/31/20 05/31/21 Yes Patel, ADomenick Bookbinder MD  traMADol (ULTRAM) 50 MG tablet Take 50 mg by mouth every 6 (six) hours as needed for moderate pain.    Yes [provider]  Meloxicam POWD 15 mg by Does not apply route daily as needed. Patient not taking: Reported on 06/14/2020 06/08/20   RIzora Ribas MD  Multiple Vitamin (MULTIVITAMIN WITH MINERALS) TABS tablet Take 1 tablet by mouth daily. Patient not taking: Reported on 06/14/2020 05/04/20   NMariel Aloe MD  polyethylene glycol (MIRALAX / GLYCOLAX) 17 g packet Take 17 g by mouth  2 (two) times daily. Patient not taking: Reported on 06/08/2020 05/31/20   Angiulli, Lavon Paganini,  PA-C   No Known Allergies Review of Systems  Unable to perform ROS: Acuity of condition    Physical Exam Vitals and nursing note reviewed.  Constitutional:      General: She is not in acute distress.    Appearance: She is ill-appearing.  Cardiovascular:     Rate and Rhythm: Normal rate.  Pulmonary:     Effort: Pulmonary effort is normal. No respiratory distress.  Abdominal:     Comments: Soft rounded abdomen  Skin:    General: Skin is warm and dry.  Neurological:     Mental Status: She is alert. Mental status is at baseline.     Comments: Pressured speech, history of CVA, able to make her needs known  Psychiatric:     Comments: Calm and cooperative     Vital Signs: BP 129/83 (BP Location: Left Leg)   Pulse 74   Temp 98.7 F (37.1 C) (Oral)   Resp 14   LMP 09/15/2013   SpO2 98%  Pain Scale: 0-10   Pain Score: 0-No pain   SpO2: SpO2: 98 % O2 Device:SpO2: 98 % O2 Flow Rate: .O2 Flow Rate (L/min): 4 L/min  IO: Intake/output summary: No intake or output data in the 24 hours ending 06/17/20 1510  LBM:   Baseline Weight:   Most recent weight:       Palliative Assessment/Data:   Flowsheet Rows     Most Recent Value  Intake Tab  Referral Department Hospitalist  Unit at Time of Referral Intermediate Care Unit  Palliative Care Primary Diagnosis Neurology  Date Notified 06/15/20  Palliative Care Type New Palliative care  Date of Admission 06/13/20  Date first seen by Palliative Care 06/17/20  # of days Palliative referral response time 2 Day(s)  # of days IP prior to Palliative referral 2  Clinical Assessment  Palliative Performance Scale Score 20%  Pain Max last 24 hours Not able to report  Pain Min Last 24 hours Not able to report  Dyspnea Max Last 24 Hours Not able to report  Dyspnea Min Last 24 hours Not able to report  Psychosocial & Spiritual Assessment  Palliative Care Outcomes      Time In: 1330 Time Out: 1420 Time Total: 50 minutes Greater  than 50%  of this time was spent counseling and coordinating care related to the above assessment and plan.  Signed by: Drue Novel, NP   Please contact Palliative Medicine Team phone at [phone] for questions and concerns.  For individual provider: See Shea Evans

## 2020-06-17 NOTE — Progress Notes (Signed)
PROGRESS NOTE    [name]  IRW:[id number] DOB: 10/21/1960 DOA: 06/13/2020 PCP: No primary care provider on file.   Brief Narrative:  HPI per Dr. Zada Finders on 06/13/20 [name] is a 59 y.o. female with medical history significant for uncontrolled hypertension, CKD stage III, and recent stroke on 04/27/2020 involving right ACA territory (with residual left-sided hemiparesis and dysarthria) felt to be embolic due to unknown source who presents to the ED as a code stroke for evaluation of worsening left facial weakness and slurred speech compared to recent baseline.  Patient recently admitted 04/26/2020-05/03/2020 for acute right ACA branch infarct.  She was started on DAPT x3 weeks and transition to aspirin 81 mg alone.  She had negative work-up for source of stroke.  She had residual deficits of left-sided hemiparesis and dysarthria.  She was discharged to [facility] where she stayed until discharged from inpatient rehab 06/01/2020.  Patient was last known well around 2 PM today when family saw her.  On recheck afterwards they noticed that she had worsening left facial droop and slurred speech.  She was unable to move left side of her body which is unchanged since her recent stroke.  EMS were called and on their arrival she was noted to have SBP in the 200s.  Patient was reported to have recent severe constipation followed by a very large bowel movement.  She has had some episodes of emesis recently.  Currently patient has no complaints and denies chest pain, dyspnea, further nausea/vomiting, abdominal pain, dysuria.  She reports chronic pain in her left leg for which she has been taking tramadol and recently started on meloxicam.  ED Course:  Initial vitals showed BP 191/97, pulse 92, RR 18, SPO2 100% on room air.  Labs show WBC 8.7, hemoglobin 12.2, platelets 179,000, sodium 138, potassium 3.8, bicarb 20, BUN 18, creatinine 1.34, serum glucose 85, LFTs within normal  limits.  I-STAT beta-hCG <5.0.  Respiratory PCR panel is ordered and pending.  CT head without contrast showed previously identified right ACA territory infarct changes with appearance raising possibility for possible interval expansion and/or new acute component.  MRI brain and MRA head without contrast showed an evolving subacute hematoma involving the left cerebellum which is new compared to prior MRI.  Interval expansion and enlargement of previously identified right ACA territory infarct seen, now moderate in size and acute to subacute in appearance.  Scattered acute to subacute ischemic nonhemorrhagic infarcts involving the subcortical posterior left frontal region, right parietal white matter, and splenium of the corpus callosum also seen.  Multiple chronic microhemorrhages, predominantly clustered about the thalami identified.  Neurology were consulted and recommended medical admission for careful blood pressure management.  Recommending obtaining TEE and avoiding any antiplatelets or anticoagulants for now.  The hospitalist service was consulted to admit for further evaluation and management.  **Interim History Patient is undergoing full stroke work-up and underwent a TEE on 06/15/20 which showed no evidence of intracardiac thrombus.  Patient has declined a loop recorder but because her inflammatory markers are still elevated and we still do not know why she has had these CVAs so Neurology has recommeded an LP was unsuccessful last night but done under fluoroscopy this morning.  LP fluid analysis was relatively unremarkable so neurology recommended obtaining a CT of the chest abdomen and pelvis to look for malignancy showed no findings to explain the patient's history of embolic stroke however there was a 4 mm right middle lobe pulmonary nodule as  well as a 2.4 x 1.7 cm bilobed low-density lesion versus 2 adjacent lesions in the anterior pole of the right kidney.  Likely this was a cyst  complicated by proteinaceous debris or hemorrhage and they have recommended follow-up MRI and this can be done in the outpatient setting. Blood pressure still remains slightly elevated so medications will be adjusted.  PT OT recommended skilled nursing facility in transitional care assisting with placement and bed has been offered but insurance authorization is still pending.  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 No significant regional mass effect. Finding suspected to be hypertensive in nature. Interval expansion and enlargement of previously identified right ACA territory infarct, now moderate in size and acute to subacute in appearance. No significant mass effect. Few additional scattered acute to subacute ischemic nonhemorrhagic infarcts involving the subcortical posterior left frontal region, right periatrial white matter, and splenium of the corpus callosum. Underlying age-related cerebral atrophy with advanced chronic microvascular ischemic disease. Multiple chronic microhemorrhages, predominantly clustered about the thalami, favored to be related to chronic poorly controlled hypertension given distribution. Underlying atrophy with advanced chronic small vessel ischemic disease." -Brain MRA showed "Motion degraded exam. Grossly stable appearance of the intracranial circulation. No large vessel occlusion. Scattered atheromatous  change about the carotid siphons, right ACA, and PCAs. No definite new or progressive finding." -Neurology consulted and following.  Holding anticoagulants and antiplatelets at this time. -Cardiology consulted for TEE and showed "No Evidence of intracardiac thrombus. There is some evidence of concentric hypertrophy in the mid and apex without apical aneurysm. Consider outpatient, TTE with contrast, in follow up if clinically indicated."   -Patient Declines Loop recorder -Neuro Recommending LP and will pursue event unclear etiology of her CVAs.  Could be a vasculitis so neurology will pursue an LP as she has declined a loop recorder; LP done this morning under fluoroscopy -Negative lower extremity Dopplers for DVT.   -PT/OT/SLP consulted and are recommending skilled nursing facility   -2D echo showed ejection fraction of 60 to 16%, grade 1 diastolic dysfunction, no intracardiac source of emboli. -She was taking Aspirin at home.we will continue aspirin 81 mg p.o. daily, as well as atorvastatin 40 mg daily -Her LDL as per August this year was 82 and hemoglobin A1c was 5.9. -Because her LP fluid analysis was relatively unremarkable neurology recommended a CT of chest/abdomen/pelvis and this was done and it showed "No findings to explain the patient's history of embolic stroke."  There was a 4 mm nodule noted that can be followed up with a noncontrast CT in 12 months as well as a 2.4 x 1 point centimeter bilobed low-density lesion versus 2 adjacent lesions in the anterior lower pole of the right kidney I think characterized by a cyst complicated by proteinaceous debris or hemorrhage -Palliative care consulted and she is full code and remains full code and will have outpatient palliative follow-up at short-term rehab at home  Pulmonary Nodule -Incidental finding as it is a 4 mm right middle lobe  pulmonary nodule.  -Non-contrast chest CT can be considered in 12 months if patient is high-risk.   Renal  Cyst -Incidental finding as it is a  2.4 x 1.7 cm bilobed low-density lesion versus 2 adjacent lesions anterior lower pole right kidney. Due to artifact from the patient's arms, this lesion cannot be definitively characterized but has attenuation too high to be a simple cyst. While likely a cyst complicated by proteinaceous debris or hemorrhage -Follow-up MRI recommended to further evaluate. MRI should be deferred until the patient is able to adequately participate with breath holding and positioning.  Hypertensive Emergency -Severely hypertensive on presentation.  -Blood pressure goal of 140/90 but this Afternoon was 148/85 -Continue with amlodipine 10 mg p.o. daily, isosorbide mononitrate 60 mils p.o. daily, metoprolol 25 mg p.o. twice daily increased to 50 twice daily, and she has as needed medications labetalol 10 mg IV every 2 as needed for high blood pressure for systolic blood pressure of 706 or diastolic blood pressure of 100 and will add IV hydralazine 10 mg every 6 as needed for systolic blood pressure of 237 or diastolic blood pressure greater than 90 -Continue as needed meds for severe hypertension.  She is hypertensive and is more than 24 hours after presentation -Follow blood pressures per protocol  Nausea/Vomiting/Constipation -Probably nausea and vomiting related with intracranial hematoma.   -Currently much improved.  She was also constipated on presentation and she had a large bowel movement. -Continue with supportive care and antiemetics and bowel regimen IV fluid hydration has now stopped  CKD stage IIIa Metabolic Acidosis -Currently kidney function at baseline but mildly worsened -Patient's CO2 is now 21, anion gap is 11, and chloride level is 107 -Patient's BUN/Cr went from 19/1.30 -> 18/1.45 and yesterday BUN/creatinine was 17/1.43 and today is 14/1.24 -IV fluid hydration is now stopped -Avoid further nephrotoxic medications, contrast dyes, hypotension and renally  dose medications -Repeat CMP in a.m.  Elevated Inflammatory Markers -Elevated CRP (4.0) and ESR (67). ANA/RF negative.  Vasculitis is a possibility.We will check ANCAs and are negative -Neurology planning on doing an LP to rule out CNS vasculitis however this is unlikely per Dr. Phoebe Sharps recommendation; and LP was unsuccessful so it had to be done under fluoroscopy and CSF sent for analysis showed a clear appearance with 2 RBCs, 9 WBCs, too few cells to count.  Gram stain done and showed no organisms present on cytospin smear -CT of the abdomen pelvis as above and showed no evidence of any malignancy but did have a pulmonary nodule and did have a complex renal cyst  Hypokalemia -Patient's potassium this morning was 3.3 -Replete with p.o. potassium chloride 40 mEQ twice daily x2 doses again today -Continue monitor and replete as necessary -Repeat CMP in the AM   Normocytic Anemia -Patient's Hgb/Hct went from 9.6/30.3 -> 9.6/30.1 -> 10.3/31.4 -Checked Anemia Panel and showed iron level of 49, U IBC 140, TIBC 189, saturation ratio 26%, ferritin level 79, folate of 12.9, vitamin B12 level of 1342 -Continue to Monitor for S/Sx of Bleeding -Repeat CBC in the AM   Overweight -Estimated body mass index is 25.69 kg/m as calculated from the following:   Height as of 06/08/20: 5' 3"  (1.6 m).   Weight as of 06/08/20: 65.8 kg. -Continued Weight Loss and Dietary Counseling given   DVT prophylaxis: SCDs Code Status: FULL CODE Family Communication: No family present at bedside Disposition Plan: Pending clearance by neurology but she will likely need skilled nursing facility; Bed has been  offered but Insurance Authorization is pending   Status is: Inpatient  Remains inpatient appropriate because:Altered mental status, Ongoing diagnostic testing needed not appropriate for outpatient work up, IV treatments appropriate due to intensity of illness or inability to take PO and Inpatient level of care  appropriate due to severity of illness   Dispo: The patient is from: Home              Anticipated d/c is to: SNF              Anticipated d/c date is:12 days              Patient currently is medically stable to d/c.  Consultants:   Neurology  Cardiology for TEE  Palliative Care Medicine   Procedures:  ECHOCARDIOGRAM IMPRESSIONS    1. Intracavitary gradient, peak velocity 2.38 m/s. Peak gradient 22.7  mmHg.Marland Kitchen Left ventricular ejection fraction, by estimation, is 60 to 65%.  The left ventricle has normal function. The left ventricle has no regional  wall motion abnormalities. There is  mild concentric left ventricular hypertrophy. Left ventricular diastolic  parameters are consistent with Grade I diastolic dysfunction (impaired  relaxation). Elevated left ventricular end-diastolic pressure.  2. Right ventricular systolic function is normal. The right ventricular  size is normal. There is normal pulmonary artery systolic pressure.  3. Left atrial size was mildly dilated.  4. The mitral valve is normal in structure. No evidence of mitral valve  regurgitation. No evidence of mitral stenosis.  5. The aortic valve is normal in structure. Aortic valve regurgitation is  not visualized. No aortic stenosis is present.  6. The inferior vena cava is normal in size with greater than 50%  respiratory variability, suggesting right atrial pressure of 3 mmHg.   FINDINGS  Left Ventricle: Intracavitary gradient, peak velocity 2.38 m/s. Peak  gradient 22.7 mmHg. Left ventricular ejection fraction, by estimation, is  60 to 65%. The left ventricle has normal function. The left ventricle has  no regional wall motion  abnormalities. The left ventricular internal cavity size was normal in  size. There is mild concentric left ventricular hypertrophy. Left  ventricular diastolic parameters are consistent with Grade I diastolic  dysfunction (impaired relaxation). Elevated  left ventricular  end-diastolic pressure.   Right Ventricle: The right ventricular size is normal. No increase in  right ventricular wall thickness. Right ventricular systolic function is  normal. There is normal pulmonary artery systolic pressure. The tricuspid  regurgitant velocity is 1.24 m/s, and  with an assumed right atrial pressure of 3 mmHg, the estimated right  ventricular systolic pressure is 9.2 mmHg.   Left Atrium: Left atrial size was mildly dilated.   Right Atrium: Right atrial size was normal in size.   Pericardium: There is no evidence of pericardial effusion.   Mitral Valve: The mitral valve is normal in structure. No evidence of  mitral valve regurgitation. No evidence of mitral valve stenosis.   Tricuspid Valve: The tricuspid valve is normal in structure. Tricuspid  valve regurgitation is trivial. No evidence of tricuspid stenosis.   Aortic Valve: The aortic valve is normal in structure. Aortic valve  regurgitation is not visualized. No aortic stenosis is present.   Pulmonic Valve: The pulmonic valve was normal in structure. Pulmonic valve  regurgitation is not visualized. No evidence of pulmonic stenosis.   Aorta: The aortic root is normal in size and structure.   Venous: The inferior vena cava is normal in size with greater than 50%  respiratory  variability, suggesting right atrial pressure of 3 mmHg.   IAS/Shunts: No atrial level shunt detected by color flow Doppler.     LEFT VENTRICLE  PLAX 2D  LVIDd:     4.88 cm Diastology  LVIDs:     3.47 cm LV e' medial:  3.65 cm/s  LV PW:     1.23 cm LV E/e' medial: 15.3  LV IVS:    1.07 cm LV e' lateral:  3.97 cm/s  LVOT diam:   1.90 cm LV E/e' lateral: 14.1  LV SV:     73  LV SV Index:  44  LVOT Area:   2.84 cm     RIGHT VENTRICLE  RV S prime:   15.80 cm/s  TAPSE (M-mode): 1.7 cm   LEFT ATRIUM       Index    RIGHT ATRIUM      Index  LA diam:    3.50 cm 2.08 cm/m  RA Area:   13.20 cm  LA Vol (A2C):  59.9 ml 35.51 ml/m RA Volume:  29.70 ml 17.61 ml/m  LA Vol (A4C):  40.1 ml 23.78 ml/m  LA Biplane Vol: 50.2 ml 29.76 ml/m  AORTIC VALVE  LVOT Vmax:  155.00 cm/s  LVOT Vmean: 121.000 cm/s  LVOT VTI:  0.259 m    AORTA  Ao Root diam: 2.70 cm  Ao Asc diam: 2.60 cm   MITRAL VALVE        TRICUSPID VALVE  MV Area (PHT): 2.50 cm  TR Peak grad:  6.2 mmHg  MV Decel Time: 303 msec  TR Vmax:    124.00 cm/s  MV E velocity: 56.00 cm/s  MV A velocity: 74.40 cm/s SHUNTS  MV E/A ratio: 0.75    Systemic VTI: 0.26 m               Systemic Diam: 1.90 cm   TEE IMPRESSIONS    1. Left ventricular ejection fraction, by estimation, is 60 to 65%. The  left ventricle has normal function. The left ventricle has no regional  wall motion abnormalities. Left ventricular diastolic function could not  be evaluated.  2. Right ventricular systolic function is normal. The right ventricular  size is normal.  3. Left atrial size was mildly dilated. No left atrial/left atrial  appendage thrombus was detected.  4. The mitral valve is normal in structure. Mild mitral valve  regurgitation.  5. The aortic valve is tricuspid. Aortic valve regurgitation is not  visualized.  6. There is mild (Grade II) plaque.  7. Agitated saline contrast bubble study was negative, with no evidence  of any interatrial shunt.   Conclusion(s)/Recommendation(s): No Evidence of intracardiac thrombus.  There is some evidence of concentric hypertrophy in the mid and apex  without apical aneurysm. Consider outpatient, TTE with contrast, in follow  up if clinically indicated.   FINDINGS  Left Ventricle: There is evidence of midventricular and apical  hypertrophy, best observed in deep gastric imaging. No denfinitive  evidence for apical hyperrophy (HCM Variant). Left ventricular ejection  fraction, by estimation, is 60 to 65%. The left   ventricle has normal function. The left ventricle has no regional wall  motion abnormalities. The left ventricular internal cavity size was normal  in size. Left ventricular diastolic function could not be evaluated.   Right Ventricle: The right ventricular size is normal. No increase in  right ventricular wall thickness. Right ventricular systolic function is  normal.   Left Atrium: Left atrial size was mildly dilated.  No left atrial/left  atrial appendage thrombus was detected.   Right Atrium: Prominent Eustachain ridge. Right atrial size was normal in  size.   Pericardium: Trivial pericardial effusion is present.   Mitral Valve: The mitral valve is normal in structure. Mild mitral valve  regurgitation.   Tricuspid Valve: The tricuspid valve is normal in structure. Tricuspid  valve regurgitation is trivial.   Aortic Valve: The aortic valve is tricuspid. Aortic valve regurgitation is  not visualized.   Pulmonic Valve: The pulmonic valve was grossly normal. Pulmonic valve  regurgitation is not visualized.   Aorta: The aortic root and ascending aorta are structurally normal, with  no evidence of dilitation. There is mild (Grade II) plaque.   IAS/Shunts: No atrial level shunt detected by color flow Doppler. Agitated  saline contrast was given intravenously to evaluate for intracardiac  shunting. Agitated saline contrast bubble study was negative, with no  evidence of any interatrial shunt.     LEFT VENTRICLE  PLAX 2D  LVOT diam:   2.10 cm  LVOT Area:   3.46 cm       AORTA  Ao Root diam: 2.80 cm  Ao Asc diam: 2.90 cm     SHUNTS  Systemic Diam: 2.10 cm   Antimicrobials:   Anti-infectives (From admission, onward)   None      Subjective: Seen and examined and she had no complaints or concerns.  Denies any chest pain, lightheadedness or dizziness.  No nausea or vomiting.  Continues to remain weak on the left side.  No other concerns or complaints at  this time.  Objective: Vitals:   06/16/20 2359 06/17/20 0408 06/17/20 1054 06/17/20 1453  BP: (!) 170/90 (!) 174/91 129/83 (!) 148/85  Pulse: 97 87 74 85  Resp: 17 16 14  (!) 23  Temp: 98.5 F (36.9 C) 98.3 F (36.8 C) 98.7 F (37.1 C) 98.5 F (36.9 C)  TempSrc: Oral Oral Oral Oral  SpO2: 99% 99% 98% 99%   No intake or output data in the 24 hours ending 06/17/20 1640 There were no vitals filed for this visit.  Examination: Physical Exam:  Constitutional: WN/WD overweight African-American female currently no acute distress appears calm and comfortable Eyes: Lids and conjunctivae normal, sclerae anicteric  ENMT: External Ears, Nose appear normal. Grossly normal hearing.  Neck: Appears normal, supple, no cervical masses, normal ROM, no appreciable thyromegaly; no JVD Respiratory: Diminished to auscultation bilaterally, no wheezing, rales, rhonchi or crackles. Normal respiratory effort and patient is not tachypenic. No accessory muscle use.  Unlabored breathing Cardiovascular: RRR, no murmurs / rubs / gallops. S1 and S2 auscultated.  No appreciable lower extremity edema Abdomen: Soft, non-tender, mildly distended. Bowel sounds positive.  GU: Deferred. Musculoskeletal: No clubbing / cyanosis of digits/nails. No joint deformity upper and lower extremities. Skin: No rashes, lesions, ulcers on limited skin evaluation. No induration; Warm and dry.  Neurologic: CN 2-12 grossly intact with no focal deficits.  As some left-sided weakness.  Romberg sign and cerebellar reflexes were not assessed Psychiatric: Normal judgment and insight. Alert and oriented x 3. Normal mood and appropriate affect.   Data Reviewed: I have personally reviewed following labs and imaging studies  CBC: Recent Labs  Lab 06/13/20 2115 06/13/20 2126 06/15/20 0842 06/16/20 0444 06/17/20 0456  WBC 8.7  --  5.9 8.1 6.9  NEUTROABS 5.7  --  3.7 6.0 4.4  HGB 12.2 12.9 9.6* 9.6* 10.3*  HCT 38.0 38.0 30.3* 30.1* 31.4*   MCV 89.8  --  87.6 88.8 88.5  PLT 179  --  157 158 797   Basic Metabolic Panel: Recent Labs  Lab 06/13/20 2115 06/13/20 2126 06/15/20 0318 06/15/20 0842 06/16/20 0444 06/17/20 0456  NA 138 139 138  --  142 139  K 3.8 3.5 3.3*  --  3.7 3.3*  CL 104 105 106  --  109 107  CO2 20*  --  22  --  20* 21*  GLUCOSE 85 90 101*  --  96 103*  BUN 18 19 18   --  17 14  CREATININE 1.34* 1.30* 1.45*  --  1.43* 1.24*  CALCIUM 10.3  --  9.8  --  9.8 9.9  MG  --   --   --  1.8 1.9 1.9  PHOS  --   --   --   --  3.3 3.7   GFR: Estimated Creatinine Clearance: 45.1 mL/min (A) (by C-G formula based on SCr of 1.24 mg/dL (H)). Liver Function Tests: Recent Labs  Lab 06/13/20 2115 06/16/20 0444 06/17/20 0456  AST 20 19 20   ALT 26 24 24   ALKPHOS 83 69 71  BILITOT 0.6 0.6 0.5  PROT 7.8 6.6 6.5  ALBUMIN 3.4* 3.0* 2.9*   No results for input(s): LIPASE, AMYLASE in the last 168 hours. No results for input(s): AMMONIA in the last 168 hours. Coagulation Profile: Recent Labs  Lab 06/13/20 2115 06/15/20 0318  INR 1.0 1.1   Cardiac Enzymes: No results for input(s): CKTOTAL, CKMB, CKMBINDEX, TROPONINI in the last 168 hours. BNP (last 3 results) No results for input(s): PROBNP in the last 8760 hours. HbA1C: No results for input(s): HGBA1C in the last 72 hours. CBG: Recent Labs  Lab 06/13/20 2115  GLUCAP 88   Lipid Profile: No results for input(s): CHOL, HDL, LDLCALC, TRIG, CHOLHDL, LDLDIRECT in the last 72 hours. Thyroid Function Tests: No results for input(s): TSH, T4TOTAL, FREET4, T3FREE, THYROIDAB in the last 72 hours. Anemia Panel: Recent Labs    06/17/20 0456  VITAMINB12 1,342*  FOLATE 12.9  FERRITIN 79  TIBC 189*  IRON 49  RETICCTPCT 0.8   Sepsis Labs: No results for input(s): PROCALCITON, LATICACIDVEN in the last 168 hours.  Recent Results (from the past 240 hour(s))  Respiratory Panel by RT PCR (Flu A&B, Covid) - Nasopharyngeal Swab     Status: None   Collection Time:  06/14/20  1:38 AM   Specimen: Nasopharyngeal Swab  Result Value Ref Range Status   SARS Coronavirus 2 by RT PCR NEGATIVE NEGATIVE Final    Comment: (NOTE) SARS-CoV-2 target nucleic acids are NOT DETECTED.  The SARS-CoV-2 RNA is generally detectable in upper respiratoy specimens during the acute phase of infection. The lowest concentration of SARS-CoV-2 viral copies this assay can detect is 131 copies/mL. A negative result does not preclude SARS-Cov-2 infection and should not be used as the sole basis for treatment or other patient management decisions. A negative result may occur with  improper specimen collection/handling, submission of specimen other than nasopharyngeal swab, presence of viral mutation(s) within the areas targeted by this assay, and inadequate number of viral copies (<131 copies/mL). A negative result must be combined with clinical observations, patient history, and epidemiological information. The expected result is Negative.  Fact Sheet for Patients:  PinkCheek.be  Fact Sheet for Healthcare Providers:  GravelBags.it  This test is no t yet approved or cleared by the [place] FDA and  has been authorized for detection and/or diagnosis of SARS-CoV-2 by FDA under an  Emergency Use Authorization (EUA). This EUA will remain  in effect (meaning this test can be used) for the duration of the COVID-19 declaration under Section 564(b)(1) of the Act, 21 U.S.C. section 360bbb-3(b)(1), unless the authorization is terminated or revoked sooner.     Influenza A by PCR NEGATIVE NEGATIVE Final   Influenza B by PCR NEGATIVE NEGATIVE Final    Comment: (NOTE) The Xpert Xpress SARS-CoV-2/FLU/RSV assay is intended as an aid in  the diagnosis of influenza from Nasopharyngeal swab specimens and  should not be used as a sole basis for treatment. Nasal washings and  aspirates are unacceptable for Xpert Xpress  SARS-CoV-2/FLU/RSV  testing.  Fact Sheet for Patients: PinkCheek.be  Fact Sheet for Healthcare Providers: GravelBags.it  This test is not yet approved or cleared by the [place] FDA and  has been authorized for detection and/or diagnosis of SARS-CoV-2 by  FDA under an Emergency Use Authorization (EUA). This EUA will remain  in effect (meaning this test can be used) for the duration of the  Covid-19 declaration under Section 564(b)(1) of the Act, 21  U.S.C. section 360bbb-3(b)(1), unless the authorization is  terminated or revoked. Performed at [facility], [place] [address] [place], [place] [address]   Gram stain     Status: None   Collection Time: 06/16/20  9:53 AM   Specimen: PATH Cytology CSF; Cerebrospinal Fluid  Result Value Ref Range Status   Specimen Description CSF  Final   Special Requests NONE  Final   Gram Stain   Final    CYTOSPIN SMEAR NO WBC SEEN NO ORGANISMS SEEN Performed at [facility], [place] [address], [place], [place] [address]    Report Status 06/16/2020 FINAL  Final     RN Pressure Injury Documentation:     Estimated body mass index is 25.69 kg/m as calculated from the following:   Height as of 06/08/20: 5' 3"  (1.6 m).   Weight as of 06/08/20: 65.8 kg.  Malnutrition Type:      Malnutrition Characteristics:      Nutrition Interventions:    Radiology Studies: CT CHEST ABDOMEN PELVIS W CONTRAST  Result Date: 06/17/2020 CLINICAL DATA:  Stroke.  Evaluate for embolic source. EXAM: CT CHEST, ABDOMEN, AND PELVIS WITH CONTRAST TECHNIQUE: Multidetector CT imaging of the chest, abdomen and pelvis was performed following the standard protocol during bolus administration of intravenous contrast. CONTRAST:  17m OMNIPAQUE IOHEXOL 300 MG/ML  SOLN COMPARISON:  None. FINDINGS: CT CHEST FINDINGS Cardiovascular: Heart is enlarged. Coronary artery calcification is evident.  Atherosclerotic calcification is noted in the wall of the thoracic aorta. No CT evidence for filling defect in either atrial appendage or ventricular apex to suggest thrombus. Aortic arch vessels are widely patent proximally. Mediastinum/Nodes: No mediastinal lymphadenopathy. There is no hilar lymphadenopathy. The esophagus has normal imaging features. There is no axillary lymphadenopathy. Lungs/Pleura: 4 mm right middle lobe nodule identified on 61/12. No focal airspace consolidation. No pleural effusion. Musculoskeletal: No worrisome lytic or sclerotic osseous abnormality. CT ABDOMEN PELVIS FINDINGS Hepatobiliary: No suspicious focal abnormality within the liver parenchyma. Gallbladder is surgically absent. No intrahepatic or extrahepatic biliary dilation. Pancreas: No focal mass lesion. No dilatation of the main duct. No intraparenchymal cyst. No peripancreatic edema. Spleen: No splenomegaly. No focal mass lesion. Adrenals/Urinary Tract: No adrenal nodule or mass. 2.4 x 1.7 cm bilobed low-density lesion versus 2 adjacent lesions noted anterior lower pole right kidney. Due to artifact from the patient's arms, this lesion cannot be definitively  characterized but has attenuation too high to be a simple cyst. No focal gross mass lesion noted left kidney. No evidence for hydroureter. The urinary bladder appears normal for the degree of distention. Stomach/Bowel: Stomach is unremarkable. No gastric wall thickening. No evidence of outlet obstruction. Duodenum is normally positioned as is the ligament of Treitz. No small bowel wall thickening. No small bowel dilatation. The terminal ileum is normal. The appendix is not visualized, but there is no edema or inflammation in the region of the cecum. No gross colonic mass. No colonic wall thickening. Vascular/Lymphatic: No abdominal aortic aneurysm. Mild atherosclerotic changes noted abdominal aorta. The portal vein, superior mesenteric vein and splenic vein are patent. There  is no gastrohepatic or hepatoduodenal ligament lymphadenopathy. No retroperitoneal or mesenteric lymphadenopathy. No pelvic sidewall lymphadenopathy. Reproductive: The uterus is unremarkable.  There is no adnexal mass. Other: No intraperitoneal free fluid. Musculoskeletal: No worrisome lytic or sclerotic osseous abnormality. IMPRESSION: 1. No findings to explain the patient's history of embolic stroke. 2. 4 mm right middle lobe pulmonary nodule. No follow-up needed if patient is low-risk. Non-contrast chest CT can be considered in 12 months if patient is high-risk. This recommendation follows the consensus statement: Guidelines for Management of Incidental Pulmonary Nodules Detected on CT Images: From the Fleischner Society 2017; Radiology 2017; 284:228-243. 3. 2.4 x 1.7 cm bilobed low-density lesion versus 2 adjacent lesions anterior lower pole right kidney. Due to artifact from the patient's arms, this lesion cannot be definitively characterized but has attenuation too high to be a simple cyst. While likely a cyst complicated by proteinaceous debris or hemorrhage, follow-up MRI recommended to further evaluate. MRI should be deferred until the patient is able to adequately participate with breath holding and positioning. 4. Aortic Atherosclerosis (ICD10-I70.0). Electronically Signed   By: Misty Stanley M.D.   On: 06/17/2020 06:31   DG FL GUIDED LUMBAR PUNCTURE  Result Date: 06/16/2020 CLINICAL DATA:  Cryptogenic stroke. EXAM: DIAGNOSTIC LUMBAR PUNCTURE UNDER FLUOROSCOPIC GUIDANCE FLUOROSCOPY TIME:  Fluoroscopy Time:  2 minutes and 6 seconds Radiation Exposure Index (if provided by the fluoroscopic device): Not applicable. Number of Acquired Spot Images: 1 PROCEDURE: Informed consent was obtained from the patient prior to the procedure, including potential complications of headache, and pain. A "time out" was performed. With the patient prone, the lower back was prepped with Betadine. 1% Lidocaine was used for  local anesthesia. Lumbar puncture was performed at the L3-4 level using a 20 gauge needle with return of clear CSF with an opening pressure of 20 cm water. 10 ml of CSF were obtained for laboratory studies. The patient tolerated the procedure well and there were no apparent complications. IMPRESSION: Non complicated lumbar puncture as detailed above. Electronically Signed   By: Abigail Miyamoto M.D.   On: 06/16/2020 10:21   Scheduled Meds: .  stroke: mapping our early stages of recovery book   Does not apply Once  . amLODipine  10 mg Oral Daily  . aspirin EC  325 mg Oral Daily  . atorvastatin  40 mg Oral Daily  . clopidogrel  75 mg Oral Daily  . enoxaparin (LOVENOX) injection  40 mg Subcutaneous Q24H  . ferrous sulfate  325 mg Oral BID WC  . FLUoxetine  10 mg Oral Daily  . hydrALAZINE  50 mg Oral Q8H  . isosorbide mononitrate  60 mg Oral Daily  . lidocaine  5 mL Intradermal Once  . metoprolol tartrate  50 mg Oral BID  . potassium chloride  40  mEq Oral BID   Continuous Infusions:   LOS: 4 days   Kerney Elbe, DO [facility] PAGER is on [place]  If 7PM-7AM, please contact night-coverage [email or web address]

## 2020-06-17 NOTE — Progress Notes (Signed)
STROKE TEAM PROGRESS NOTE   SUBJECTIVE (INTERVAL HISTORY) No family at bedside.  Patient lying in bed, no complaints, no acute event overnight.  Neuro stable.  Pan CT no malignancy.  OBJECTIVE Temp:  [98.2 F (36.8 C)-98.5 F (36.9 C)] 98.3 F (36.8 C) (10/08 0408) Pulse Rate:  [86-97] 87 (10/08 0408) Cardiac Rhythm: Normal sinus rhythm (10/08 0700) Resp:  [15-17] 16 (10/08 0408) BP: (170-174)/(73-91) 174/91 (10/08 0408) SpO2:  [99 %] 99 % (10/08 0408)  Recent Labs  Lab 06/13/20 2115  GLUCAP 88   Recent Labs  Lab 06/13/20 2115 06/13/20 2115 06/13/20 2126 06/15/20 0318 06/15/20 0842 06/16/20 0444 06/17/20 0456  NA 138  --  139 138  --  142 139  K 3.8  --  3.5 3.3*  --  3.7 3.3*  CL 104  --  105 106  --  109 107  CO2 20*  --   --  22  --  20* 21*  GLUCOSE 85  --  90 101*  --  96 103*  BUN 18  --  19 18  --  17 14  CREATININE 1.34*  --  1.30* 1.45*  --  1.43* 1.24*  CALCIUM 10.3   < >  --  9.8  --  9.8 9.9  MG  --   --   --   --  1.8 1.9 1.9  PHOS  --   --   --   --   --  3.3 3.7   < > = values in this interval not displayed.   Recent Labs  Lab 06/13/20 2115 06/16/20 0444 06/17/20 0456  AST $Re'20 19 20  'bDe$ ALT $R'26 24 24  'Bh$ ALKPHOS 83 69 71  BILITOT 0.6 0.6 0.5  PROT 7.8 6.6 6.5  ALBUMIN 3.4* 3.0* 2.9*   Recent Labs  Lab 06/13/20 2115 06/13/20 2126 06/15/20 0842 06/16/20 0444 06/17/20 0456  WBC 8.7  --  5.9 8.1 6.9  NEUTROABS 5.7  --  3.7 6.0 4.4  HGB 12.2 12.9 9.6* 9.6* 10.3*  HCT 38.0 38.0 30.3* 30.1* 31.4*  MCV 89.8  --  87.6 88.8 88.5  PLT 179  --  157 158 166   No results for input(s): CKTOTAL, CKMB, CKMBINDEX, TROPONINI in the last 168 hours. Recent Labs    06/15/20 0318  LABPROT 14.2  INR 1.1   Recent Labs    06/15/20 0355  COLORURINE YELLOW  LABSPEC 1.017  PHURINE 5.0  GLUCOSEU NEGATIVE  HGBUR NEGATIVE  BILIRUBINUR NEGATIVE  KETONESUR 5*  PROTEINUR NEGATIVE  NITRITE NEGATIVE  LEUKOCYTESUR NEGATIVE       Component Value Date/Time    CHOL 132 06/13/2020 2115   TRIG 61 06/13/2020 2115   HDL 73 06/13/2020 2115   CHOLHDL 1.8 06/13/2020 2115   VLDL 12 06/13/2020 2115   LDLCALC 47 06/13/2020 2115   Lab Results  Component Value Date   HGBA1C 5.9 (H) 04/28/2020      Component Value Date/Time   LABOPIA NONE DETECTED 06/15/2020 0355   COCAINSCRNUR NONE DETECTED 06/15/2020 0355   LABBENZ NONE DETECTED 06/15/2020 0355   AMPHETMU NONE DETECTED 06/15/2020 0355   THCU NONE DETECTED 06/15/2020 0355   LABBARB NONE DETECTED 06/15/2020 0355    No results for input(s): ETH in the last 168 hours.  I have personally reviewed the radiological images below and agree with the radiology interpretations.  DG Chest 1 View  Result Date: 06/14/2020 CLINICAL DATA:  Vomiting.  Altered mental status. EXAM: CHEST  1 VIEW COMPARISON:  05/22/2020 FINDINGS: The heart is enlarged but stable from prior study. Both lungs are clear. The visualized skeletal structures are unremarkable. There are atherosclerotic changes of the thoracic aorta. IMPRESSION: No active disease. Electronically Signed   By: Constance Holster M.D.   On: 06/14/2020 00:47   DG Abd 1 View  Result Date: 06/14/2020 CLINICAL DATA:  Altered mental status with nausea and vomiting. EXAM: ABDOMEN - 1 VIEW COMPARISON:  05/23/2020 FINDINGS: The bowel gas pattern is normal. No radio-opaque calculi or other significant radiographic abnormality are seen. IMPRESSION: Negative. Electronically Signed   By: Constance Holster M.D.   On: 06/14/2020 00:48   DG Abd 1 View  Result Date: 05/23/2020 CLINICAL DATA:  Abdominal pain, mid to lower EXAM: ABDOMEN - 1 VIEW COMPARISON:  None. FINDINGS: No dilated small bowel loops. Mild colonic stool. No evidence of pneumatosis or pneumoperitoneum. Surgical clips are seen in the right upper quadrant of the abdomen. Clear lung bases. Lower lumbar spondylosis. IMPRESSION: Nonobstructive bowel gas pattern. Electronically Signed   By: Ilona Sorrel M.D.   On:  05/23/2020 11:22   MR ANGIO HEAD WO CONTRAST  Result Date: 06/13/2020 CLINICAL DATA:  Initial evaluation for neuro deficit, stroke suspected. History of recent right ACA territory infarct. EXAM: MRI HEAD WITHOUT AND WITH CONTRAST MRA HEAD WITHOUT CONTRAST TECHNIQUE: Multiplanar, multiecho pulse sequences of the brain and surrounding structures were obtained without and with intravenous contrast. Angiographic images of the head were obtained using MRA technique without contrast. CONTRAST:  6.53mL GADAVIST GADOBUTROL 1 MMOL/ML IV SOLN COMPARISON:  Comparison made with CT from earlier same day as well as previous CTA and MRI from 04/27/2020. FINDINGS: MRI HEAD FINDINGS Brain: Diffuse prominence of the CSF containing spaces compatible with generalized age-related cerebral atrophy. Patchy and confluent T2/FLAIR hyperintensity within the periventricular and deep white matter both cerebral hemispheres most consistent with chronic small vessel ischemic disease, advanced in nature. Patchy involvement of the deep gray nuclei, brainstem, and cerebellum noted as well. Few scattered remote lacunar infarcts present about the hemispheric cerebral white matter and thalami. 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 Associated enhancement seen following contrast administration. No associated hemorrhage or significant mass effect. Few additional scattered foci of restricted diffusion involving the subcortical posterior left frontal region, right periatrial white matter, and splenium of the corpus callosum also consistent with acute to subacute ischemic infarcts. No associated hemorrhage. These findings are new as compared to prior exam. 1.4 x 2.3 x 1.7 cm well-circumscribed lobulated T1/FLAIR hyperintense lesion seen involving  the left cerebellum (series 4, image 7). Lesion demonstrates fairly avid root restricted diffusion. No significant associated enhancement or regional mass effect. Finding favored to reflect an evolving subacute hematoma, new as compared to prior MRI. Finding is difficult to see on prior CT performed earlier the same day. No other findings to suggest acute or subacute intracranial hemorrhage. Multiple additional chronic microhemorrhages noted, predominantly clustered about the thalami, favored to be related to chronic underlying poorly controlled hypertension given distribution. This additional finding suggests that the left cerebellar hematoma may be hypertensive in nature as well. No mass lesion, mass effect, or midline shift. No hydrocephalus or extra-axial fluid collection. No other abnormal enhancement. Pituitary gland and suprasellar region within normal limits. Midline structures intact. Vascular: Major intracranial vascular flow voids are maintained. Skull and  upper cervical spine: Craniocervical junction within normal limits. Bone marrow signal intensity normal. No scalp soft tissue abnormality. Sinuses/Orbits: Globes and orbital soft tissues within normal limits. Left maxillary sinus retention cyst. Paranasal sinuses are otherwise clear. No mastoid effusion. Other: None. MRA HEAD FINDINGS ANTERIOR CIRCULATION: Examination moderately degraded by motion artifact. Visualized distal cervical segments of the internal carotid arteries are patent with symmetric antegrade flow. Petrous segments patent bilaterally. Scattered atheromatous change noted throughout the carotid siphons, grossly similar to previous CTA. A1 segments patent bilaterally. Normal anterior communicating artery complex. Left ACA widely patent. Right A2 segment hypoplastic and diminutive, but remains grossly patent. M1 segments patent bilaterally. Normal MCA bifurcations. Distal MCA branches well perfused and symmetric. POSTERIOR CIRCULATION:  Vertebral arteries patent to the vertebrobasilar junction without stenosis. Left PICA patent at its origin. Right PICA not seen. Basilar grossly patent to its distal aspect without appreciable stenosis. Superior cerebral arteries patent bilaterally. Both PCAs primarily supplied via the basilar. Scattered atheromatous change about the PCAs bilaterally without occlusion, grossly similar to previous. No appreciable aneurysm. IMPRESSION: MRI HEAD IMPRESSION: 1. 1.4 x 2.3 x 1.7 cm well-circumscribed lobulated T1/FLAIR hyperintense lesion involving the left cerebellum, favored to reflect an evolving subacute hematoma, new as compared to prior MRI. No significant regional mass effect. Finding suspected to be hypertensive in nature. 2. Interval expansion and enlargement of previously identified right ACA territory infarct, now moderate in size and acute to subacute in appearance. No significant mass effect. 3. Few additional scattered acute to subacute ischemic nonhemorrhagic infarcts involving the subcortical posterior left frontal region, right periatrial white matter, and splenium of the corpus callosum. 4. Underlying age-related cerebral atrophy with advanced chronic microvascular ischemic disease. 5. Multiple chronic microhemorrhages, predominantly clustered about the thalami, favored to be related to chronic poorly controlled hypertension given distribution. 6. Underlying atrophy with advanced chronic small vessel ischemic disease. MRA HEAD IMPRESSION: 1. Motion degraded exam. 2. Grossly stable appearance of the intracranial circulation. No large vessel occlusion. Scattered atheromatous change about the carotid siphons, right ACA, and PCAs. No definite new or progressive finding. Electronically Signed   By: Jeannine Boga M.D.   On: 06/13/2020 23:29   MR BRAIN W WO CONTRAST  Result Date: 06/13/2020 CLINICAL DATA:  Initial evaluation for neuro deficit, stroke suspected. History of recent right ACA territory  infarct. EXAM: MRI HEAD WITHOUT AND WITH CONTRAST MRA HEAD WITHOUT CONTRAST TECHNIQUE: Multiplanar, multiecho pulse sequences of the brain and surrounding structures were obtained without and with intravenous contrast. Angiographic images of the head were obtained using MRA technique without contrast. CONTRAST:  6.70mL GADAVIST GADOBUTROL 1 MMOL/ML IV SOLN COMPARISON:  Comparison made with CT from earlier same day as well as previous CTA and MRI from 04/27/2020. FINDINGS: MRI HEAD FINDINGS Brain: Diffuse prominence of the CSF containing spaces compatible with generalized age-related cerebral atrophy. Patchy and confluent T2/FLAIR hyperintensity within the periventricular and deep white matter both cerebral hemispheres most consistent with chronic small vessel ischemic disease, advanced in nature. Patchy involvement of the deep gray nuclei, brainstem, and cerebellum noted as well. Few scattered remote lacunar infarcts present about the hemispheric cerebral white matter and thalami. 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 Associated enhancement seen following contrast administration. No associated hemorrhage or significant mass effect. Few additional scattered foci of restricted diffusion involving the subcortical posterior left frontal  region, right periatrial white matter, and splenium of the corpus callosum also consistent with acute to subacute ischemic infarcts. No associated hemorrhage. These findings are new as compared to prior exam. 1.4 x 2.3 x 1.7 cm well-circumscribed lobulated T1/FLAIR hyperintense lesion seen involving the left cerebellum (series 4, image 7). Lesion demonstrates fairly avid root restricted diffusion. No significant associated enhancement or regional mass effect. Finding favored to  reflect an evolving subacute hematoma, new as compared to prior MRI. Finding is difficult to see on prior CT performed earlier the same day. No other findings to suggest acute or subacute intracranial hemorrhage. Multiple additional chronic microhemorrhages noted, predominantly clustered about the thalami, favored to be related to chronic underlying poorly controlled hypertension given distribution. This additional finding suggests that the left cerebellar hematoma may be hypertensive in nature as well. No mass lesion, mass effect, or midline shift. No hydrocephalus or extra-axial fluid collection. No other abnormal enhancement. Pituitary gland and suprasellar region within normal limits. Midline structures intact. Vascular: Major intracranial vascular flow voids are maintained. Skull and upper cervical spine: Craniocervical junction within normal limits. Bone marrow signal intensity normal. No scalp soft tissue abnormality. Sinuses/Orbits: Globes and orbital soft tissues within normal limits. Left maxillary sinus retention cyst. Paranasal sinuses are otherwise clear. No mastoid effusion. Other: None. MRA HEAD FINDINGS ANTERIOR CIRCULATION: Examination moderately degraded by motion artifact. Visualized distal cervical segments of the internal carotid arteries are patent with symmetric antegrade flow. Petrous segments patent bilaterally. Scattered atheromatous change noted throughout the carotid siphons, grossly similar to previous CTA. A1 segments patent bilaterally. Normal anterior communicating artery complex. Left ACA widely patent. Right A2 segment hypoplastic and diminutive, but remains grossly patent. M1 segments patent bilaterally. Normal MCA bifurcations. Distal MCA branches well perfused and symmetric. POSTERIOR CIRCULATION: Vertebral arteries patent to the vertebrobasilar junction without stenosis. Left PICA patent at its origin. Right PICA not seen. Basilar grossly patent to its distal aspect without  appreciable stenosis. Superior cerebral arteries patent bilaterally. Both PCAs primarily supplied via the basilar. Scattered atheromatous change about the PCAs bilaterally without occlusion, grossly similar to previous. No appreciable aneurysm. IMPRESSION: MRI HEAD IMPRESSION: 1. 1.4 x 2.3 x 1.7 cm well-circumscribed lobulated T1/FLAIR hyperintense lesion involving the left cerebellum, favored to reflect an evolving subacute hematoma, new as compared to prior MRI. No significant regional mass effect. Finding suspected to be hypertensive in nature. 2. Interval expansion and enlargement of previously identified right ACA territory infarct, now moderate in size and acute to subacute in appearance. No significant mass effect. 3. Few additional scattered acute to subacute ischemic nonhemorrhagic infarcts involving the subcortical posterior left frontal region, right periatrial white matter, and splenium of the corpus callosum. 4. Underlying age-related cerebral atrophy with advanced chronic microvascular ischemic disease. 5. Multiple chronic microhemorrhages, predominantly clustered about the thalami, favored to be related to chronic poorly controlled hypertension given distribution. 6. Underlying atrophy with advanced chronic small vessel ischemic disease. MRA HEAD IMPRESSION: 1. Motion degraded exam. 2. Grossly stable appearance of the intracranial circulation. No large vessel occlusion. Scattered atheromatous change about the carotid siphons, right ACA, and PCAs. No definite new or progressive finding. Electronically Signed   By: Jeannine Boga M.D.   On: 06/13/2020 23:29   MR PELVIS WO CONTRAST  Result Date: 05/28/2020 CLINICAL DATA:  Pelvic pain. EXAM: MRI PELVIS WITHOUT CONTRAST TECHNIQUE: Multiplanar multisequence MR imaging of the pelvis was performed. No intravenous contrast was administered. COMPARISON:  None. FINDINGS: Bones: No hip fracture, dislocation or avascular necrosis. No  periosteal reaction  or bone destruction. No aggressive osseous lesion. Normal sacrum and sacroiliac joints. No SI joint widening or erosive changes. Degenerative disc disease with disc height loss at L5-S1. minimal grade 1 anterolisthesis of L4 on L5 secondary to moderate bilateral facet arthropathy. Bilateral foraminal narrowing at L4-5 with mild spinal stenosis. Articular cartilage and labrum Articular cartilage:  No chondral defect. Labrum: Grossly intact, but evaluation is limited by lack of intraarticular fluid. Joint or bursal effusion Joint effusion:  No hip joint effusion.  No SI joint effusion. Bursae:  No bursa formation. Muscles and tendons Flexors: Normal. Extensors: Normal. Abductors: Normal. Adductors: Mild muscle edema in the left adductor musculature likely reflecting muscle strain versus myositis. Gluteals: Mild muscle edema in the gluteus minimus and medius muscle bilaterally which may reflect mild myositis or muscle strain, left greater than right. Hamstrings: Normal. Other findings No pelvic free fluid. No fluid collection or hematoma. No inguinal lymphadenopathy. No inguinal hernia. Soft tissue edema in the subcutaneous fat superficial to the gluteus maximus muscle bilaterally likely reflecting contusions or fat necrosis. IMPRESSION: 1. No hip fracture, dislocation or avascular necrosis. 2. Mild muscle edema in the left adductor musculature likely reflecting muscle strain versus myositis. 3. Mild muscle edema in the gluteus minimus and medius muscle bilaterally which may reflect mild myositis or muscle strain, left greater than right. 4. Soft tissue edema in the subcutaneous fat superficial to the gluteus maximus muscle bilaterally likely reflecting contusions or fat necrosis. Electronically Signed   By: Kathreen Devoid   On: 05/28/2020 09:58   CT CHEST ABDOMEN PELVIS W CONTRAST  Result Date: 06/17/2020 CLINICAL DATA:  Stroke.  Evaluate for embolic source. EXAM: CT CHEST, ABDOMEN, AND PELVIS WITH CONTRAST  TECHNIQUE: Multidetector CT imaging of the chest, abdomen and pelvis was performed following the standard protocol during bolus administration of intravenous contrast. CONTRAST:  164mL OMNIPAQUE IOHEXOL 300 MG/ML  SOLN COMPARISON:  None. FINDINGS: CT CHEST FINDINGS Cardiovascular: Heart is enlarged. Coronary artery calcification is evident. Atherosclerotic calcification is noted in the wall of the thoracic aorta. No CT evidence for filling defect in either atrial appendage or ventricular apex to suggest thrombus. Aortic arch vessels are widely patent proximally. Mediastinum/Nodes: No mediastinal lymphadenopathy. There is no hilar lymphadenopathy. The esophagus has normal imaging features. There is no axillary lymphadenopathy. Lungs/Pleura: 4 mm right middle lobe nodule identified on 61/12. No focal airspace consolidation. No pleural effusion. Musculoskeletal: No worrisome lytic or sclerotic osseous abnormality. CT ABDOMEN PELVIS FINDINGS Hepatobiliary: No suspicious focal abnormality within the liver parenchyma. Gallbladder is surgically absent. No intrahepatic or extrahepatic biliary dilation. Pancreas: No focal mass lesion. No dilatation of the main duct. No intraparenchymal cyst. No peripancreatic edema. Spleen: No splenomegaly. No focal mass lesion. Adrenals/Urinary Tract: No adrenal nodule or mass. 2.4 x 1.7 cm bilobed low-density lesion versus 2 adjacent lesions noted anterior lower pole right kidney. Due to artifact from the patient's arms, this lesion cannot be definitively characterized but has attenuation too high to be a simple cyst. No focal gross mass lesion noted left kidney. No evidence for hydroureter. The urinary bladder appears normal for the degree of distention. Stomach/Bowel: Stomach is unremarkable. No gastric wall thickening. No evidence of outlet obstruction. Duodenum is normally positioned as is the ligament of Treitz. No small bowel wall thickening. No small bowel dilatation. The terminal  ileum is normal. The appendix is not visualized, but there is no edema or inflammation in the region of the cecum. No gross colonic mass. No colonic  wall thickening. Vascular/Lymphatic: No abdominal aortic aneurysm. Mild atherosclerotic changes noted abdominal aorta. The portal vein, superior mesenteric vein and splenic vein are patent. There is no gastrohepatic or hepatoduodenal ligament lymphadenopathy. No retroperitoneal or mesenteric lymphadenopathy. No pelvic sidewall lymphadenopathy. Reproductive: The uterus is unremarkable.  There is no adnexal mass. Other: No intraperitoneal free fluid. Musculoskeletal: No worrisome lytic or sclerotic osseous abnormality. IMPRESSION: 1. No findings to explain the patient's history of embolic stroke. 2. 4 mm right middle lobe pulmonary nodule. No follow-up needed if patient is low-risk. Non-contrast chest CT can be considered in 12 months if patient is high-risk. This recommendation follows the consensus statement: Guidelines for Management of Incidental Pulmonary Nodules Detected on CT Images: From the Fleischner Society 2017; Radiology 2017; 284:228-243. 3. 2.4 x 1.7 cm bilobed low-density lesion versus 2 adjacent lesions anterior lower pole right kidney. Due to artifact from the patient's arms, this lesion cannot be definitively characterized but has attenuation too high to be a simple cyst. While likely a cyst complicated by proteinaceous debris or hemorrhage, follow-up MRI recommended to further evaluate. MRI should be deferred until the patient is able to adequately participate with breath holding and positioning. 4. Aortic Atherosclerosis (ICD10-I70.0). Electronically Signed   By: Misty Stanley M.D.   On: 06/17/2020 06:31   DG CHEST PORT 1 VIEW  Result Date: 05/22/2020 CLINICAL DATA:  Persistent cough EXAM: PORTABLE CHEST 1 VIEW COMPARISON:  09/19/2015 FINDINGS: No focal opacity or pleural effusion. Mild cardiomegaly. Aortic atherosclerosis. No pneumothorax. Skin  fold artifact over the right lower chest. IMPRESSION: No active disease.  Mild cardiomegaly. Electronically Signed   By: Donavan Foil M.D.   On: 05/22/2020 19:47   ECHOCARDIOGRAM COMPLETE  Result Date: 06/14/2020    ECHOCARDIOGRAM REPORT   Patient Name:   [name] Date of Exam: 06/14/2020 Medical Rec #:  [id number]           Height:       63.0 in Accession #:    [id number]          Weight:       145.0 lb Date of Birth:  14-Sep-1960           BSA:          1.687 m Patient Age:    78 years            BP:           190/105 mmHg Patient Gender: F                   HR:           75 bpm. Exam Location:  Inpatient Procedure: 2D Echo, Cardiac Doppler and Color Doppler Indications:    Stroke 434.91 / I163.9  History:        Patient has prior history of Echocardiogram examinations, most                 recent 04/27/2020. Stroke; Risk Factors:Hypertension.  Sonographer:    Bernadene Person RDCS Referring Phys: [id number] Cleaster Corin PATEL  Sonographer Comments: patient on right side IMPRESSIONS  1. Intracavitary gradient, peak velocity 2.38 m/s. Peak gradient 22.7 mmHg.Marland Kitchen Left ventricular ejection fraction, by estimation, is 60 to 65%. The left ventricle has normal function. The left ventricle has no regional wall motion abnormalities. There is mild concentric left ventricular hypertrophy. Left ventricular diastolic parameters are consistent with Grade I diastolic dysfunction (impaired relaxation). Elevated left ventricular end-diastolic pressure.  2. Right ventricular  systolic function is normal. The right ventricular size is normal. There is normal pulmonary artery systolic pressure.  3. Left atrial size was mildly dilated.  4. The mitral valve is normal in structure. No evidence of mitral valve regurgitation. No evidence of mitral stenosis.  5. The aortic valve is normal in structure. Aortic valve regurgitation is not visualized. No aortic stenosis is present.  6. The inferior vena cava is normal in size with greater  than 50% respiratory variability, suggesting right atrial pressure of 3 mmHg. FINDINGS  Left Ventricle: Intracavitary gradient, peak velocity 2.38 m/s. Peak gradient 22.7 mmHg. Left ventricular ejection fraction, by estimation, is 60 to 65%. The left ventricle has normal function. The left ventricle has no regional wall motion abnormalities. The left ventricular internal cavity size was normal in size. There is mild concentric left ventricular hypertrophy. Left ventricular diastolic parameters are consistent with Grade I diastolic dysfunction (impaired relaxation). Elevated left ventricular end-diastolic pressure. Right Ventricle: The right ventricular size is normal. No increase in right ventricular wall thickness. Right ventricular systolic function is normal. There is normal pulmonary artery systolic pressure. The tricuspid regurgitant velocity is 1.24 m/s, and  with an assumed right atrial pressure of 3 mmHg, the estimated right ventricular systolic pressure is 9.2 mmHg. Left Atrium: Left atrial size was mildly dilated. Right Atrium: Right atrial size was normal in size. Pericardium: There is no evidence of pericardial effusion. Mitral Valve: The mitral valve is normal in structure. No evidence of mitral valve regurgitation. No evidence of mitral valve stenosis. Tricuspid Valve: The tricuspid valve is normal in structure. Tricuspid valve regurgitation is trivial. No evidence of tricuspid stenosis. Aortic Valve: The aortic valve is normal in structure. Aortic valve regurgitation is not visualized. No aortic stenosis is present. Pulmonic Valve: The pulmonic valve was normal in structure. Pulmonic valve regurgitation is not visualized. No evidence of pulmonic stenosis. Aorta: The aortic root is normal in size and structure. Venous: The inferior vena cava is normal in size with greater than 50% respiratory variability, suggesting right atrial pressure of 3 mmHg. IAS/Shunts: No atrial level shunt detected by color  flow Doppler.  LEFT VENTRICLE PLAX 2D LVIDd:         4.88 cm  Diastology LVIDs:         3.47 cm  LV e' medial:    3.65 cm/s LV PW:         1.23 cm  LV E/e' medial:  15.3 LV IVS:        1.07 cm  LV e' lateral:   3.97 cm/s LVOT diam:     1.90 cm  LV E/e' lateral: 14.1 LV SV:         73 LV SV Index:   44 LVOT Area:     2.84 cm  RIGHT VENTRICLE RV S prime:     15.80 cm/s TAPSE (M-mode): 1.7 cm LEFT ATRIUM             Index       RIGHT ATRIUM           Index LA diam:        3.50 cm 2.08 cm/m  RA Area:     13.20 cm LA Vol (A2C):   59.9 ml 35.51 ml/m RA Volume:   29.70 ml  17.61 ml/m LA Vol (A4C):   40.1 ml 23.78 ml/m LA Biplane Vol: 50.2 ml 29.76 ml/m  AORTIC VALVE LVOT Vmax:   155.00 cm/s LVOT Vmean:  121.000 cm/s LVOT  VTI:    0.259 m  AORTA Ao Root diam: 2.70 cm Ao Asc diam:  2.60 cm MITRAL VALVE               TRICUSPID VALVE MV Area (PHT): 2.50 cm    TR Peak grad:   6.2 mmHg MV Decel Time: 303 msec    TR Vmax:        124.00 cm/s MV E velocity: 56.00 cm/s MV A velocity: 74.40 cm/s  SHUNTS MV E/A ratio:  0.75        Systemic VTI:  0.26 m                            Systemic Diam: 1.90 cm Skeet Latch MD Electronically signed by Skeet Latch MD Signature Date/Time: 06/14/2020/1:15:36 PM    Final    ECHO TEE  Result Date: 06/15/2020    TRANSESOPHOGEAL ECHO REPORT   Patient Name:   Tonya Mau Grindle Date of Exam: 06/15/2020 Medical Rec #:  [id number]           Height:       63.0 in Accession #:    [id number]          Weight:       145.0 lb Date of Birth:  [birth year]           BSA:          1.687 m Patient Age:    89 years            BP:           180/76 mmHg Patient Gender: F                   HR:           78 bpm. Exam Location:  Inpatient Procedure: Cardiac Doppler, Color Doppler and Transesophageal Echo Indications:     Stroke  History:         Patient has prior history of Echocardiogram examinations, most                  recent 06/14/2020. CHF; Risk Factors:Hypertension. H/O CVA. CKD.  Sonographer:      Clayton Lefort RDCS (AE) Referring Phys:  [place] Phys: Rudean Haskell MD PROCEDURE: After discussion of the risks and benefits of a TEE, an informed consent was obtained from the patient. The transesophogeal probe was passed without difficulty through the esophogus of the patient. Local oropharyngeal anesthetic was provided with Cetacaine. Sedation performed by different physician. The patient was monitored while under deep sedation. Anesthestetic sedation was provided intravenously by Anesthesiology: 151.86mg  of Propofol, 100mg  of Lidocaine. Image quality was good. The patient developed no complications during the procedure. IMPRESSIONS  1. Left ventricular ejection fraction, by estimation, is 60 to 65%. The left ventricle has normal function. The left ventricle has no regional wall motion abnormalities. Left ventricular diastolic function could not be evaluated.  2. Right ventricular systolic function is normal. The right ventricular size is normal.  3. Left atrial size was mildly dilated. No left atrial/left atrial appendage thrombus was detected.  4. The mitral valve is normal in structure. Mild mitral valve regurgitation.  5. The aortic valve is tricuspid. Aortic valve regurgitation is not visualized.  6. There is mild (Grade II) plaque.  7. Agitated saline contrast bubble study was negative, with no evidence of any interatrial shunt. Conclusion(s)/Recommendation(s): No Evidence of intracardiac thrombus. There  is some evidence of concentric hypertrophy in the mid and apex without apical aneurysm. Consider outpatient, TTE with contrast, in follow up if clinically indicated. FINDINGS  Left Ventricle: There is evidence of midventricular and apical hypertrophy, best observed in deep gastric imaging. No denfinitive evidence for apical hyperrophy (HCM Variant). Left ventricular ejection fraction, by estimation, is 60 to 65%. The left ventricle has normal function. The left ventricle has no  regional wall motion abnormalities. The left ventricular internal cavity size was normal in size. Left ventricular diastolic function could not be evaluated. Right Ventricle: The right ventricular size is normal. No increase in right ventricular wall thickness. Right ventricular systolic function is normal. Left Atrium: Left atrial size was mildly dilated. No left atrial/left atrial appendage thrombus was detected. Right Atrium: Prominent Eustachain ridge. Right atrial size was normal in size. Pericardium: Trivial pericardial effusion is present. Mitral Valve: The mitral valve is normal in structure. Mild mitral valve regurgitation. Tricuspid Valve: The tricuspid valve is normal in structure. Tricuspid valve regurgitation is trivial. Aortic Valve: The aortic valve is tricuspid. Aortic valve regurgitation is not visualized. Pulmonic Valve: The pulmonic valve was grossly normal. Pulmonic valve regurgitation is not visualized. Aorta: The aortic root and ascending aorta are structurally normal, with no evidence of dilitation. There is mild (Grade II) plaque. IAS/Shunts: No atrial level shunt detected by color flow Doppler. Agitated saline contrast was given intravenously to evaluate for intracardiac shunting. Agitated saline contrast bubble study was negative, with no evidence of any interatrial shunt.  LEFT VENTRICLE PLAX 2D LVOT diam:     2.10 cm LVOT Area:     3.46 cm   AORTA Ao Root diam: 2.80 cm Ao Asc diam:  2.90 cm  SHUNTS Systemic Diam: 2.10 cm Rudean Haskell MD Electronically signed by Rudean Haskell MD Signature Date/Time: 06/15/2020/1:18:01 PM    Final    CT HEAD CODE STROKE WO CONTRAST  Result Date: 06/13/2020 CLINICAL DATA:  Code stroke. Initial evaluation for acute left facial droop, slurred speech. Patient has a history of a recent right ACA territory infarct. EXAM: CT HEAD WITHOUT CONTRAST TECHNIQUE: Contiguous axial images were obtained from the base of the skull through the vertex  without intravenous contrast. COMPARISON:  Prior CT from 09/19/2015. Additionally, there is a previous brain MRI from 04/27/2020, reviewed in epic, but not available for review in PACS. FINDINGS: Brain: Generalized age-related cerebral atrophy. Patchy and confluent hypodensity involving the periventricular deep white matter of both cerebral hemispheres most consistent with chronic small vessel ischemic disease. Patchy involvement of the deep gray nuclei, brainstem, and cerebellum noted as well. Appearance is fairly advanced in nature. Few scattered remote lacunar infarcts noted about the bilateral thalami. There is cortical and subcortical hypodensity involving the parasagittal right frontoparietal region, consistent with right ACA territory ischemia. Finding corresponds with previously identified right ACA territory infarct. However, appearance is somewhat more prominent on current exam as compared to previous brain MRI, raising the possibility for possible interval expansion and/or new acute component. Punctate hyperdensity at the posterior aspect of this area of ischemia suspicious for a small focus of associated petechial hemorrhage (series 3, image 23). No frank hemorrhagic transformation or mass effect. No other evidence for acute large vessel territory infarct. Gray-white matter differentiation otherwise grossly maintained. No mass lesion or midline shift. No hydrocephalus or extra-axial fluid collection. Vascular: No hyperdense vessel. Scattered vascular calcifications noted within the carotid siphons. Skull: Scalp soft tissues and calvarium within normal limits. Sinuses/Orbits: Globes and orbital soft tissues  demonstrate no acute finding. Left maxillary sinus retention cyst noted. Paranasal sinuses are otherwise largely clear. No mastoid effusion. Other: Poor dentition noted. ASPECTS [facility] Stroke Program Early CT Score) - Ganglionic level infarction (caudate, lentiform nuclei, internal capsule, insula,  M1-M3 cortex): 7 - Supraganglionic infarction (M4-M6 cortex): 3 Total score (0-10 with 10 being normal): 10 IMPRESSION: 1. Cortical and subcortical hypodensity involving the parasagittal right frontoparietal region, consistent with previously identified right ACA territory infarct. Appearance is somewhat more prominent on current exam as compared to previous brain MRI, raising the possibility for possible interval expansion and/or new acute component. Punctate hyperdensity at the posterior aspect of this area of ischemia suspicious for a small focus of associated petechial hemorrhage. No frank hemorrhagic transformation or mass effect. 2. ASPECTS is 10. 3. 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 These results were communicated to Dr. Rory Percy at 9:35 pmon 10/4/2021by text page via the [facility] messaging system. Findings also discussed by telephone with Dr. Rory Percy at time of dictation. Electronically Signed   By: Jeannine Boga M.D.   On: 06/13/2020 21:49   VAS [place] LOWER EXTREMITY VENOUS (DVT)  Result Date: 06/14/2020  Lower Venous DVTStudy Indications: Stroke.  Comparison Study: no prior Performing Technologist: Abram Sander RVS  Examination Guidelines: A complete evaluation includes B-mode imaging, spectral Doppler, color Doppler, and power Doppler as needed of all accessible portions of each vessel. Bilateral testing is considered an integral part of a complete examination. Limited examinations for reoccurring indications may be performed as noted. The reflux portion of the exam is performed with the patient in reverse Trendelenburg.  +---------+---------------+---------+-----------+----------+--------------+ RIGHT    CompressibilityPhasicitySpontaneityPropertiesThrombus Aging +---------+---------------+---------+-----------+----------+--------------+ CFV      Full           Yes      Yes                                  +---------+---------------+---------+-----------+----------+--------------+ SFJ      Full                                                        +---------+---------------+---------+-----------+----------+--------------+ FV Prox  Full                                                        +---------+---------------+---------+-----------+----------+--------------+ FV Mid   Full                                                        +---------+---------------+---------+-----------+----------+--------------+ FV DistalFull                                                        +---------+---------------+---------+-----------+----------+--------------+ PFV      Full                                                        +---------+---------------+---------+-----------+----------+--------------+  POP      Full           Yes      Yes                                 +---------+---------------+---------+-----------+----------+--------------+ PTV      Full                                                        +---------+---------------+---------+-----------+----------+--------------+ PERO     Full                                                        +---------+---------------+---------+-----------+----------+--------------+   +---------+---------------+---------+-----------+----------+--------------+ LEFT     CompressibilityPhasicitySpontaneityPropertiesThrombus Aging +---------+---------------+---------+-----------+----------+--------------+ CFV      Full           Yes      Yes                                 +---------+---------------+---------+-----------+----------+--------------+ SFJ      Full                                                        +---------+---------------+---------+-----------+----------+--------------+ FV Prox  Full                                                         +---------+---------------+---------+-----------+----------+--------------+ FV Mid   Full                                                        +---------+---------------+---------+-----------+----------+--------------+ FV DistalFull                                                        +---------+---------------+---------+-----------+----------+--------------+ PFV      Full                                                        +---------+---------------+---------+-----------+----------+--------------+ POP      Full           Yes      Yes                                 +---------+---------------+---------+-----------+----------+--------------+  PTV      Full                                                        +---------+---------------+---------+-----------+----------+--------------+ PERO     Full                                                        +---------+---------------+---------+-----------+----------+--------------+     Summary: BILATERAL: - No evidence of deep vein thrombosis seen in the lower extremities, bilaterally. - No evidence of superficial venous thrombosis in the lower extremities, bilaterally. -   *See table(s) above for measurements and observations. Electronically signed by Servando Snare MD on 06/14/2020 at 12:16:46 PM.    Final    DG FL GUIDED LUMBAR PUNCTURE  Result Date: 06/16/2020 CLINICAL DATA:  Cryptogenic stroke. EXAM: DIAGNOSTIC LUMBAR PUNCTURE UNDER FLUOROSCOPIC GUIDANCE FLUOROSCOPY TIME:  Fluoroscopy Time:  2 minutes and 6 seconds Radiation Exposure Index (if provided by the fluoroscopic device): Not applicable. Number of Acquired Spot Images: 1 PROCEDURE: Informed consent was obtained from the patient prior to the procedure, including potential complications of headache, and pain. A "time out" was performed. With the patient prone, the lower back was prepped with Betadine. 1% Lidocaine was used for local anesthesia. Lumbar puncture  was performed at the L3-4 level using a 20 gauge needle with return of clear CSF with an opening pressure of 20 cm water. 10 ml of CSF were obtained for laboratory studies. The patient tolerated the procedure well and there were no apparent complications. IMPRESSION: Non complicated lumbar puncture as detailed above. Electronically Signed   By: Abigail Miyamoto M.D.   On: 06/16/2020 10:21    PHYSICAL EXAM     Temp:  [98.2 F (36.8 C)-98.5 F (36.9 C)] 98.3 F (36.8 C) (10/08 0408) Pulse Rate:  [86-97] 87 (10/08 0408) Resp:  [15-17] 16 (10/08 0408) BP: (170-174)/(73-91) 174/91 (10/08 0408) SpO2:  [99 %] 99 % (10/08 0408)  General - Well nourished, well developed, in no apparent distress.  Ophthalmologic - fundi not visualized due to noncooperation.  Cardiovascular - Regular rhythm and rate.  Mental Status -  Level of arousal and orientation to time, place, and person were intact. Language including expression, naming, repetition, comprehension was assessed and found intact. Fund of Knowledge was assessed and was intact.  Cranial Nerves II - XII - II - Visual field intact OU. III, IV, VI - Extraocular movements intact. V - Facial sensation intact bilaterally. VII - left facial droop. VIII - Hearing & vestibular intact bilaterally. X - Palate elevates symmetrically. XI - Chin turning & shoulder shrug intact bilaterally. XII - Tongue protrusion to the left.  Motor Strength - The patient's strength was at least 4/5 in right upper and 3/5 lower extremities, however left upper extremity 1/5 proximal and 0/5 distal, right upper extremity no spontaneous movement, slight withdraw to pain stimulation.  Bulk was normal and fasciculations were absent.   Motor Tone - Muscle tone was assessed at the neck and appendages and was normal.  Reflexes - The patient's reflexes were symmetrical in all extremities and she had no pathological reflexes.  Sensory -  Light touch, temperature/pinprick were  assessed and were symmetrical.    Coordination - The patient had normal movements in the right hand with no ataxia or dysmetria.  Tremor was absent.  Gait and Station - deferred.   ASSESSMENT/PLAN [name] is a 59 y.o. female with history of hypertension, stroke admitted for worsening left facial droop and slurred speech and left-sided weakness. No tPA given due to also window and recent stroke.    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 Large vessel stenosis/athero in the setting of BP fluctuation - MRA and CTA showed right ACA and bilateral PCA stenosis.  Patient current strokes correlating to stenosed vessel territories, concerning for BP related phenomena 2. 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 Autoimmune labs neg. CSF w/ 9 WBC, normal protein and slightly low glucose 58 4. Hypercoagulable state - hypercoagulable labs and pan-CT neg   CT head showed right ACA subacute infarct, larger than stroke in 04/2020  MRI Interval expansion and enlargement of previously identified right ACA territory infarct, now moderate in size and acute to subacute in appearance. Few additional scattered acute to subacute ischemic nonhemorrhagic infarcts involving the subcortical posterior left frontal region, right periatrial white matter, and splenium of the corpus callosum.  MRA Scattered atheromatous change about the carotid siphons, right ACA, and PCAs   Pan CT - no malignancy. 76mm R middle lobe pulmonary nodule, f/u not needed. R kidney low-density bilobed vs 2 adjacent lesions, likely a cyst complicated by proteinaceous debris or hemorrhageMRI recommended.    2D Echo EF 60 to  65%  TEE no LV or LAA thrombus, neg bubble. LVH.  LE venous Doppler negative  CSF w/ 9 WBC and slightly low glucose but normal protein - not consistent with CNS infection or inflammation  LDL 47  Autoimmune negative   Hypercoagulable work up neg  Lovenox for VTE prophylaxis - lovenox   aspirin 325 mg daily prior to admission, now on ASA 325 and plavix 75 DAPT for 3 months and then plavix alone given large vessel stenosis  Therapy recommendations: SNF  Disposition: Pending  Patient not interested in loop  Subacute left cerebellum hemorrhage with CMBs  MRI showed 1.4 x 2.3 x 1.7 cm well-circumscribed lobulated T1/FLAIR hyperintense lesion involving the left cerebellum, favored to reflect an evolving subacute hematoma  MRI also showed multiple chronic microhemorrhages, predominantly clustered about the thalami, favored to be related to chronic poorly controlled hypertension given distribution.  TEE showed LVH also consistent with chronic HTN  History of stroke  Admitted in 04/2020 for left-sided weakness and slurred speech.  MRI showed right ACA infarct.  CTA head and neck showed right A2 stenosis, bilateral P2 stenosis, bilateral siphon 50% stenosis, right more than left.  EF 60 to 65%.  TCD bubble study negative.  LDL 82, A1c 5.9.  Put on DAPT for 3 weeks as well as Lipitor 40.  Elevated ESR and CRP  ESR 75->67  CRP 47->4.0  Autoimmune labs negative  CSF not consistent with CNS vasculitis  Hypertensive urgency . Stable on the high end . On amlodipine 10, metoprolol 50 bid . resume hydralazine 50 Q8 . Gradually lower BP to goal within 3-5 days. . BP stable on high end  Long term BP goal 130-150 given multifocal intracranial stenosis  Hyperlipidemia  Home meds:  Lipitor 40   LDL 47, goal < 70  Now on Lipitor 40  Continue statin at discharge  Other Active Problems  OA  Hypokalemia, replete  Nausea/vomiting/constipation  CKD stage IIIa  Hospital  day # 4  Neurology will sign off. Please call with questions. Pt will follow up with stroke clinic Dr. Leonie Man at [facility] in about 4 weeks. Thanks for the consult.   Rosalin Hawking, MD PhD Stroke Neurology 06/17/2020 12:19 PM    To contact Stroke Continuity provider, please refer to [email or web address] After hours, contact General Neurology

## 2020-06-17 NOTE — TOC Progression Note (Signed)
Transition of Care [facility]) - Progression Note    Patient Details  Name: [name] MRN: [id number] Date of Birth: 1961/06/21  Transition of Care [facility]) CM/SW Contact  Pollie Friar, RN Phone Number: 06/17/2020, 9:20 AM  Clinical Narrative:    [facility] has offered a bed and the daughter has accepted. [facility] will start insurance authorization. TOC following.   Expected Discharge Plan: [place] Barriers to Discharge: Continued Medical Work up, Ship broker  Expected Discharge Plan and Services Expected Discharge Plan: [place] Choice: [place] arrangements for the past 2 months: Single Family Home                                       Social Determinants of Health (SDOH) Interventions    Readmission Risk Interventions No flowsheet data found.

## 2020-06-18 LAB — COMPREHENSIVE METABOLIC PANEL
ALT: 24 U/L (ref 0–44)
AST: 18 U/L (ref 15–41)
Albumin: 3.1 g/dL — ABNORMAL LOW (ref 3.5–5.0)
Alkaline Phosphatase: 72 U/L (ref 38–126)
Anion gap: 11 (ref 5–15)
BUN: 13 mg/dL (ref 6–20)
CO2: 22 mmol/L (ref 22–32)
Calcium: 10.1 mg/dL (ref 8.9–10.3)
Chloride: 108 mmol/L (ref 98–111)
Creatinine, Ser: 1.33 mg/dL — ABNORMAL HIGH (ref 0.44–1.00)
GFR, Estimated: 44 mL/min — ABNORMAL LOW (ref >60)
Glucose, Bld: 96 mg/dL (ref 70–99)
Potassium: 3.9 mmol/L (ref 3.5–5.1)
Sodium: 141 mmol/L (ref 135–145)
Total Bilirubin: 0.4 mg/dL (ref 0.3–1.2)
Total Protein: 7.1 g/dL (ref 6.5–8.1)

## 2020-06-18 LAB — CBC WITH DIFFERENTIAL/PLATELET
Abs Immature Granulocytes: 0.03 10*3/uL (ref 0.00–0.07)
Basophils Absolute: 0.1 10*3/uL (ref 0.0–0.1)
Basophils Relative: 1 %
Eosinophils Absolute: 0.2 10*3/uL (ref 0.0–0.5)
Eosinophils Relative: 3 %
HCT: 34.2 % — ABNORMAL LOW (ref 36.0–46.0)
Hemoglobin: 11.1 g/dL — ABNORMAL LOW (ref 12.0–15.0)
Immature Granulocytes: 0 %
Lymphocytes Relative: 20 %
Lymphs Abs: 1.5 10*3/uL (ref 0.7–4.0)
MCH: 28.5 pg (ref 26.0–34.0)
MCHC: 32.5 g/dL (ref 30.0–36.0)
MCV: 87.7 fL (ref 80.0–100.0)
Monocytes Absolute: 0.9 10*3/uL (ref 0.1–1.0)
Monocytes Relative: 12 %
Neutro Abs: 4.8 10*3/uL (ref 1.7–7.7)
Neutrophils Relative %: 64 %
Platelets: 167 10*3/uL (ref 150–400)
RBC: 3.9 MIL/uL (ref 3.87–5.11)
RDW: 14.9 % (ref 11.5–15.5)
WBC: 7.5 10*3/uL (ref 4.0–10.5)
nRBC: 0 % (ref 0.0–0.2)

## 2020-06-18 LAB — PHOSPHORUS: Phosphorus: 4.2 mg/dL (ref 2.5–4.6)

## 2020-06-18 LAB — DRVVT CONFIRM: dRVVT Confirm: 1.2 ratio (ref 0.8–1.2)

## 2020-06-18 LAB — LUPUS ANTICOAGULANT PANEL
DRVVT: 48.1 s — ABNORMAL HIGH (ref 0.0–47.0)
PTT Lupus Anticoagulant: 44.5 s (ref 0.0–51.9)

## 2020-06-18 LAB — MAGNESIUM: Magnesium: 1.9 mg/dL (ref 1.7–2.4)

## 2020-06-18 LAB — DRVVT MIX: dRVVT Mix: 40.7 s — ABNORMAL HIGH (ref 0.0–40.4)

## 2020-06-18 MED ORDER — CLOPIDOGREL BISULFATE 75 MG PO TABS: 75.0000 mg | ORAL_TABLET | Freq: Every day | ORAL | Status: AC

## 2020-06-18 MED ORDER — METOPROLOL TARTRATE 50 MG PO TABS
75.0000 mg | ORAL_TABLET | Freq: Two times a day (BID) | ORAL | Status: DC
  Administered 2020-06-18: 75 mg via ORAL
  Filled 2020-06-18: qty 1

## 2020-06-18 MED ORDER — CLONAZEPAM 0.5 MG PO TABS: 0.2500 mg | ORAL_TABLET | Freq: Two times a day (BID) | ORAL | 0 refills | Status: AC | PRN

## 2020-06-18 MED ORDER — STROKE: EARLY STAGES OF RECOVERY BOOK: 1.0000 | Freq: Once | 0 refills | Status: AC

## 2020-06-18 MED ORDER — TRAMADOL HCL 50 MG PO TABS
50.0000 mg | ORAL_TABLET | Freq: Four times a day (QID) | ORAL | 0 refills | Status: DC | PRN
Start: 2020-06-18 — End: 2021-08-09

## 2020-06-18 MED ORDER — ASPIRIN 325 MG PO TBEC: 325.0000 mg | DELAYED_RELEASE_TABLET | Freq: Every day | ORAL | 0 refills | Status: DC

## 2020-06-18 MED ORDER — METOPROLOL TARTRATE 75 MG PO TABS: 75.0000 mg | ORAL_TABLET | Freq: Two times a day (BID) | ORAL | 0 refills | Status: AC

## 2020-06-18 MED ORDER — HYDRALAZINE HCL 50 MG PO TABS
100.0000 mg | ORAL_TABLET | Freq: Three times a day (TID) | ORAL | Status: DC
  Administered 2020-06-18: 100 mg via ORAL
  Filled 2020-06-18: qty 2

## 2020-06-18 NOTE — TOC Transition Note (Addendum)
Transition of Care [facility]) - CM/SW Discharge Note   Patient Details  Name: [name] MRN: [id number] Date of Birth: Mar 15, 1961  Transition of Care [facility]) CM/SW Contact:  Elliot Gurney [place], [place] Phone Number: 06/18/2020, 2:03 PM   Clinical Narrative:    Insurance authorization received for patient to go to [facility]. Message left with intake coordinator at [facility] to coordinate transfer.  Phone call back to [facility]. Patient will be going to room 136 and will be transported by [facility]. Nurse to call in report to [phone].  Contacted patient's daughter to inform her of patient's discharge to [facility]. Address and contact information provided.  [place], LCSW Transitions of Care [phone]       Barriers to Discharge: Continued Medical Work up, Ship broker   Patient Goals and CMS Choice Patient states their goals for this hospitalization and ongoing recovery are:: to get to the rehab CMS Medicare.gov Compare Post Acute Care list provided to:: Patient Choice offered to / list presented to : Patient, Adult Children  Discharge Placement                       Discharge Plan and Services     Post Acute Care Choice: [place]                               Social Determinants of Health (SDOH) Interventions     Readmission Risk Interventions No flowsheet data found.

## 2020-06-18 NOTE — TOC Progression Note (Addendum)
Transition of Care [facility]) - Progression Note    Patient Details  Name: [name] MRN: [id number] Date of Birth: 05-15-1961  Transition of Care [facility]) CM/SW Contact  [address], [place], [place] Phone Number: 06/18/2020, 11:40 AM  Clinical Narrative:    Insurance authorization received for patient to go to [facility]. Message left with intake coordinator at [facility] to coordinate transfer.  Phone call back to [facility]. Patient will be going to room 136 and will be transported by [facility]. Nurse to call in report to [phone].  [address], LCSW Transition of Care [phone]   Expected Discharge Plan: [place] Barriers to Discharge: Continued Medical Work up, Ship broker  Expected Discharge Plan and Services Expected Discharge Plan: [place] Choice: [place] arrangements for the past 2 months: Single Family Home                                       Social Determinants of Health (SDOH) Interventions    Readmission Risk Interventions No flowsheet data found.

## 2020-06-18 NOTE — Discharge Summary (Signed)
Physician Discharge Summary  [name] ZFP:[id number] DOB: Oct 19, 1960 DOA: 06/13/2020  PCP: No primary care provider on file.  Admit date: 06/13/2020 Discharge date: 06/18/2020  Admitted From: Home Disposition: SNF  Recommendations for Outpatient Follow-up:  1. Follow up with PCP in 1-2 weeks 2. Follow up with Neurology within 4 weeks 3. Have Palliative Care follow up SNF 4. Follow-up on pulmonary nodule within 1 year with outpatient CT scan 5. Please obtain CMP/CBC, Mag, Phos in one week 6. Please follow up on the following pending results:  Home Health: No Equipment/Devices: None    Discharge Condition: Stable CODE STATUS: FULL CODE Diet recommendation: Dysphagia 2 Diet  Brief/Interim Summary: HPI per Dr. Darreld Mclean on 06/13/20 [name] a 59 y.o.femalewith medical history significant foruncontrolled hypertension, CKD stage III, and recent stroke on 04/27/2020 involving right ACA territory (with residual left-sided hemiparesis and dysarthria) felt to be embolic due to unknown source who presents to the ED as a code stroke for evaluation of worseningleft facial weakness and slurred speech compared to recent baseline.  Patient recently admitted 04/26/2020-05/03/2020 for acute right ACA branch infarct. She was started on DAPT x3 weeks and transition to aspirin 81 mg alone. She had negative work-up for source of stroke. She had residual deficits of left-sided hemiparesis and dysarthria. She was discharged to [facility] where she stayed until dischargedfrom inpatient rehab 06/01/2020.  Patient was last known well around 2 PM today when family saw her. On recheck afterwards they noticed that she had worsening left facial droop and slurred speech. She was unable to move left side of her body which is unchanged since her recent stroke. EMS were called and on their arrival she was noted to have SBP in the 200s.  Patient was reported to have recent severe  constipation followed by a very large bowel movement. She has had some episodes of emesis recently. Currently patient has no complaints and denies chest pain, dyspnea, further nausea/vomiting, abdominal pain, dysuria. She reports chronic pain in her left leg for which she has been taking tramadol and recently started on meloxicam.  ED Course: Initial vitals showed BP 191/97, pulse 92, RR 18, SPO2 100% on room air.  Labs show WBC 8.7, hemoglobin 12.2, platelets 179,000, sodium 138, potassium 3.8, bicarb 20, BUN 18, creatinine 1.34, serum glucose 85, LFTs within normal limits. I-STAT beta-hCG <5.0.  Respiratory PCR panel is ordered and pending.  CT head without contrast showed previously identified right ACA territory infarct changes with appearance raising possibility for possible interval expansion and/or new acute component.  MRI brain and MRA head without contrast showed an evolving subacute hematoma involving the left cerebellum which is new compared to prior MRI. Interval expansion and enlargement of previously identified right ACA territory infarct seen, now moderate in size and acute to subacute in appearance. Scattered acute to subacute ischemic nonhemorrhagic infarcts involving the subcortical posterior left frontal region, right parietal white matter, and splenium of the corpus callosum also seen. Multiple chronic microhemorrhages, predominantly clustered about the thalami identified.  Neurology were consulted and recommended medical admission for careful blood pressure management. Recommending obtaining TEE and avoiding any antiplatelets or anticoagulants for now. The hospitalist service was consulted to admit for further evaluation and management.  **Interim History Patient is undergoing full stroke work-up and underwent a TEE on 06/15/20 which showed no evidence of intracardiac thrombus.  Patient has declined a loop recorder but because her inflammatory markers are still  elevated and we still do not know  why she has had these CVAs so Neurology has recommeded an LP was unsuccessful last night but done under fluoroscopy this morning.  LP fluid analysis was relatively unremarkable so neurology recommended obtaining a CT of the chest abdomen and pelvis to look for malignancy showed no findings to explain the patient's history of embolic stroke however there was a 4 mm right middle lobe pulmonary nodule as well as a 2.4 x 1.7 cm bilobed low-density lesion versus 2 adjacent lesions in the anterior pole of the right kidney.  Likely this was a cyst complicated by proteinaceous debris or hemorrhage and they have recommended follow-up MRI and this can be done in the outpatient setting. Blood pressure still remains slightly elevated so medications will be adjusted.  PT OT recommended skilled nursing facility in transitions of care assisting with placement and bed has been offered and insurance Authorization was done and received today.   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 No significant regional mass effect. Finding suspected to be hypertensive in nature. Interval expansion and enlargement of previously identified right ACA territory infarct, now moderate in size and acute to subacute in appearance. No significant mass effect. Few additional scattered acute to subacute ischemic nonhemorrhagic infarcts involving  the subcortical posterior left frontal region, right periatrial white matter, and splenium of the corpus callosum. Underlying age-related cerebral atrophy with advanced chronic microvascular ischemic disease. Multiple chronic microhemorrhages, predominantly clustered about the thalami, favored to be related to chronic poorly controlled hypertension given distribution. Underlying atrophy with advanced chronic small vessel ischemic disease." -Brain MRA showed "Motion degraded exam. Grossly stable appearance of the intracranial circulation. No large vessel occlusion. Scattered atheromatous change about the carotid siphons, right ACA, and PCAs. No definite new or progressive finding." -Neurology consulted and following. Holding anticoagulants and antiplatelets at this time. -Cardiology consulted for TEE and showed "No Evidence of intracardiac thrombus. There is some evidence of concentric hypertrophy in the mid and apex without apical aneurysm. Consider outpatient, TTE with contrast, in follow up if clinically indicated."  -Patient Declines Loop recorder -Neuro Recommending LP and will pursue event unclear etiology of her CVAs.  Could be a vasculitis so neurology will pursue an LP as she has declined a loop recorder; LP done this morning under fluoroscopy -Negativelower extremity Dopplers for DVT.  -PT/OT/SLP consulted and are recommending skilled nursing facility -2D echo showed ejection fraction of 60 to 65%, grade 1 diastolic dysfunction, no intracardiac source of emboli. -She was taking Aspirin at home.we will continue aspirin 325 mg p.o. daily, as well as atorvastatin 40 mg daily; Plavix 75 mg po Daily was added by Neurology  -Her LDL as per August this year was 51 and repeat was 47 and hemoglobin A1c was 5.9. -Because her LP fluid analysis was relatively unremarkable neurology recommended a CT of chest/abdomen/pelvis and this was done and it showed "No findings to explain the patient's history of  embolic stroke."  There was a 4 mm nodule noted that can be followed up with a noncontrast CT in  12 months as well as a 2.4 x 1 point centimeter bilobed low-density lesion versus 2 adjacent lesions in the anterior lower pole of the right kidney I think characterized by a cyst complicated by proteinaceous debris or hemorrhage -Neurology recommends no other follow-up and recommending aspirin 325 mg p.o. daily as well as Plavix 75 mg p.o. daily for dual antiplatelet therapy for 3 months and then just Plavix alone given her large vessel stenosis -Palliative care consulted and she is full code and remains full code and will have outpatient palliative follow-up at short-term rehab at home  Pulmonary Nodule -Incidental finding as it is a 4 mm right middle lobe pulmonary nodule.  -Non-contrast chest CT can be considered in 12 months if patient is high-risk.   Renal Cyst -Incidental finding as it is a  2.4 x 1.7 cm bilobed low-density lesion versus 2 adjacent lesions anterior lower pole right kidney. Due to artifact from the patient's arms, this lesion cannot be definitively characterized but has attenuation too high to be a simple cyst. While likely a cyst complicated by proteinaceous debris or hemorrhage -Follow-up MRI recommended to further evaluate. MRI should be deferred until the patient is able to adequately participate with breath holding and positioning.  Hypertensive Emergency -Severely hypertensive on presentation.  -Blood pressure goal of 140/90 but this AM was 202/91 but now improved and stable at 129/79 -Continue with amlodipine 10 mg p.o. daily, isosorbide mononitrate 60 mils p.o. daily, metoprolol 25 mg p.o. twice daily increased to 50 twice daily and further increased to 75 mg po BID, and she has as needed medications labetalol 10 mg IV every 2 as needed for high blood pressure for systolic blood pressure of 132 or diastolic blood pressure of 100 and will add IV hydralazine 10 mg every 6  as needed for systolic blood pressure of 440 or diastolic blood pressure greater than 90 -Added Hydralazine 100 mg po q8h and BP is much better -Continue as needed meds for severe hypertension. She is hypertensive and is more than 24 hours after presentation -Follow blood pressures per protocol  Nausea/Vomiting/Constipation -Probablynausea and vomiting related with intracranial hematoma.  -Currently much improved. She was also constipated on presentation and she had a large bowel movement. -Continue with supportive care and antiemetics and bowel regimen -IV fluid hydration has now stopped -Symptoms have improved significantly   CKD stage IIIa Metabolic Acidosis -Currently kidney function at baseline but mildly worsened -Patient's CO2 is now 21, anion gap is 11, and chloride level is 107 -Patient's BUN/Cr went from 19/1.30 -> 18/1.45 -> 17/1.43 and yesterday was 14/1.24 and today was 13/1.33 -IV fluid hydration is now stopped -Avoid further nephrotoxic medications, contrast dyes, hypotension and renally dose medications -Repeat CMP within 1 week   Elevated Inflammatory Markers -Elevated CRP (4.0) and ESR (67).ANA/RF negative.Vasculitis is a possibility.We will check ANCAs and are negative -Neurology planning on doing an LP to rule out CNS vasculitis however this is unlikely per Dr. Phoebe Sharps recommendation; and LP was unsuccessful so it had to be done under fluoroscopy and CSF sent for analysis showed a clear appearance with 2 RBCs, 9 WBCs, too few cells to count.  Gram stain done and showed no organisms present on cytospin smear -CT of the abdomen pelvis as above and showed no evidence of any malignancy but did have a pulmonary nodule and did have a complex renal cyst -Continue to Monitor in the outpatient setting   Hypokalemia -Patient's potassium this morning was improved to 3.9 -Continue  monitor and replete as necessary -Repeat CMP within 1 week  Normocytic  Anemia -Patient's Hgb/Hct went from 9.6/30.3 -> 9.6/30.1 -> 10.3/31.4 -> 11.1/34.2 -Checked Anemia Panel and showed iron level of 49, U IBC 140, TIBC 189, saturation ratio 26%, ferritin level 79, folate of 12.9, vitamin B12 level of 1342 -Continue to Monitor for S/Sx of Bleeding -Repeat CBC in the AM   Overweight -Estimated body mass index is 25.69 kg/m as calculated from the following:   Height as of 06/08/20: $RemoveBef'5\' 3"'olWZWnRhRB$  (1.6 m).   Weight as of 06/08/20: 65.8 kg. -Continued Weight Loss and Dietary Counseling given   Discharge Instructions  Discharge Instructions    Ambulatory referral to Neurology   Complete by: As directed    Follow up with Dr. Leonie Man at [facility] in 4-6 weeks. Too complicated for NP to follow. Thanks.   Call MD for:  difficulty breathing, headache or visual disturbances   Complete by: As directed    Call MD for:  extreme fatigue   Complete by: As directed    Call MD for:  hives   Complete by: As directed    Call MD for:  persistant dizziness or light-headedness   Complete by: As directed    Call MD for:  persistant nausea and vomiting   Complete by: As directed    Call MD for:  redness, tenderness, or signs of infection (pain, swelling, redness, odor or green/yellow discharge around incision site)   Complete by: As directed    Call MD for:  severe uncontrolled pain   Complete by: As directed    Call MD for:  temperature >100.4   Complete by: As directed    Diet - low sodium heart healthy   Complete by: As directed    Dysphagia 2 Diet   Discharge instructions   Complete by: As directed    You were cared for by a hospitalist during your hospital stay. If you have any questions about your discharge medications or the care you received while you were in the hospital after you are discharged, you can call the unit and ask to speak with the hospitalist on call if the hospitalist that took care of you is not available. Once you are discharged, your primary care physician will  handle any further medical issues. Please note that NO REFILLS for any discharge medications will be authorized once you are discharged, as it is imperative that you return to your primary care physician (or establish a relationship with a primary care physician if you do not have one) for your aftercare needs so that they can reassess your need for medications and monitor your lab values.  Follow up with PCP and Neurology within 4 weeks. Take all medications as prescribed. If symptoms change or worsen please return to the ED for evaluation   Increase activity slowly   Complete by: As directed      Allergies as of 06/18/2020   No Known Allergies     Medication List    STOP taking these medications   aspirin 325 MG tablet Replaced by: aspirin 325 MG EC tablet   cloNIDine 0.2 MG tablet Commonly known as: CATAPRES   meloxicam 15 MG tablet Commonly known as: MOBIC   Meloxicam Powd     TAKE these medications    stroke: mapping our early stages of recovery book Misc 1 each by Does not apply route once for 1 dose.   acetaminophen 325 MG tablet Commonly known as: TYLENOL Take 2  tablets (650 mg total) by mouth every 4 (four) hours as needed for mild pain (or temp > 37.5 C (99.5 F)).   amantadine 50 MG/5ML solution Commonly known as: SYMMETREL Take 10 mLs (100 mg total) by mouth 2 (two) times daily with breakfast and lunch.   amLODipine 10 MG tablet Commonly known as: NORVASC Take 1 tablet (10 mg total) by mouth daily.   aspirin 325 MG EC tablet Take 1 tablet (325 mg total) by mouth daily. Start taking on: June 19, 2020 Replaces: aspirin 325 MG tablet   atorvastatin 40 MG tablet Commonly known as: LIPITOR Take 1 tablet (40 mg total) by mouth daily.   baclofen 10 MG tablet Commonly known as: LIORESAL Take 1 tablet (10 mg total) by mouth 2 (two) times daily.   clonazePAM 0.5 MG tablet Commonly known as: KlonoPIN Take 0.5 tablets (0.25 mg total) by mouth 2 (two) times  daily as needed for anxiety. What changed:   when to take this  reasons to take this   clopidogrel 75 MG tablet Commonly known as: PLAVIX Take 1 tablet (75 mg total) by mouth daily. Start taking on: June 19, 2020   diclofenac Sodium 1 % Gel Commonly known as: VOLTAREN Apply 2 g topically 4 (four) times daily.   ferrous sulfate 325 (65 FE) MG tablet Take 1 tablet (325 mg total) by mouth 2 (two) times daily with a meal.   FLUoxetine 10 MG capsule Commonly known as: PROZAC Take 1 capsule (10 mg total) by mouth daily.   hydrALAZINE 100 MG tablet Commonly known as: APRESOLINE Take 1 tablet (100 mg total) by mouth every 8 (eight) hours.   isosorbide mononitrate 60 MG 24 hr tablet Commonly known as: IMDUR Take 1 tablet (60 mg total) by mouth daily.   melatonin 3 MG Tabs tablet Take 1 tablet (3 mg total) by mouth at bedtime.   Metoprolol Tartrate 75 MG Tabs Take 75 mg by mouth 2 (two) times daily. What changed:   medication strength  how much to take   multivitamin with minerals Tabs tablet Take 1 tablet by mouth daily.   omeprazole 40 MG capsule Commonly known as: PRILOSEC Take 1 capsule (40 mg total) by mouth 2 (two) times daily for 60 doses. Open up 1 capsule & sprinkle on food twice daily   polyethylene glycol 17 g packet Commonly known as: MIRALAX / GLYCOLAX Take 17 g by mouth 2 (two) times daily.   sucralfate 1 g tablet Commonly known as: Carafate Take 1 tablet (1 g total) by mouth 4 (four) times daily. Mix 1 tablet with 10-74mls of water to make a slurry and take by mouth four times daily with meals and at bedtime   traMADol 50 MG tablet Commonly known as: ULTRAM Take 1 tablet (50 mg total) by mouth every 6 (six) hours as needed for moderate pain.       Follow-up Information    Garvin Fila, MD. Schedule an appointment as soon as possible for a visit in 4 week(s).   Specialties: Neurology, Radiology Contact information: [address] [place] [address] [phone]              No Known Allergies  Consultations:  Neurology   Cardiology for TEE  Palliative Care Medicine  Procedures/Studies: DG Chest 1 View  Result Date: 06/14/2020 CLINICAL DATA:  Vomiting.  Altered mental status. EXAM: CHEST  1 VIEW COMPARISON:  05/22/2020 FINDINGS: The heart is enlarged but stable from  prior study. Both lungs are clear. The visualized skeletal structures are unremarkable. There are atherosclerotic changes of the thoracic aorta. IMPRESSION: No active disease. Electronically Signed   By: Constance Holster M.D.   On: 06/14/2020 00:47   DG Abd 1 View  Result Date: 06/14/2020 CLINICAL DATA:  Altered mental status with nausea and vomiting. EXAM: ABDOMEN - 1 VIEW COMPARISON:  05/23/2020 FINDINGS: The bowel gas pattern is normal. No radio-opaque calculi or other significant radiographic abnormality are seen. IMPRESSION: Negative. Electronically Signed   By: Constance Holster M.D.   On: 06/14/2020 00:48   DG Abd 1 View  Result Date: 05/23/2020 CLINICAL DATA:  Abdominal pain, mid to lower EXAM: ABDOMEN - 1 VIEW COMPARISON:  None. FINDINGS: No dilated small bowel loops. Mild colonic stool. No evidence of pneumatosis or pneumoperitoneum. Surgical clips are seen in the right upper quadrant of the abdomen. Clear lung bases. Lower lumbar spondylosis. IMPRESSION: Nonobstructive bowel gas pattern. Electronically Signed   By: Ilona Sorrel M.D.   On: 05/23/2020 11:22   MR ANGIO HEAD WO CONTRAST  Result Date: 06/13/2020 CLINICAL DATA:  Initial evaluation for neuro deficit, stroke suspected. History of recent right ACA territory infarct. EXAM: MRI HEAD WITHOUT AND WITH CONTRAST MRA HEAD WITHOUT CONTRAST TECHNIQUE: Multiplanar, multiecho pulse sequences of the brain and surrounding structures were obtained without and with intravenous contrast. Angiographic images of the head were obtained using MRA technique without contrast. CONTRAST:   6.77mL GADAVIST GADOBUTROL 1 MMOL/ML IV SOLN COMPARISON:  Comparison made with CT from earlier same day as well as previous CTA and MRI from 04/27/2020. FINDINGS: MRI HEAD FINDINGS Brain: Diffuse prominence of the CSF containing spaces compatible with generalized age-related cerebral atrophy. Patchy and confluent T2/FLAIR hyperintensity within the periventricular and deep white matter both cerebral hemispheres most consistent with chronic small vessel ischemic disease, advanced in nature. Patchy involvement of the deep gray nuclei, brainstem, and cerebellum noted as well. Few scattered remote lacunar infarcts present about the hemispheric cerebral white matter and thalami. 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 Associated enhancement seen following contrast administration. No associated hemorrhage or significant mass effect. Few additional scattered foci of restricted diffusion involving the subcortical posterior left frontal region, right periatrial white matter, and splenium of the corpus callosum also consistent with acute to subacute ischemic infarcts. No associated hemorrhage. These findings are new as compared to prior exam. 1.4 x 2.3 x 1.7 cm well-circumscribed lobulated T1/FLAIR hyperintense lesion seen involving the left cerebellum (series 4, image 7). Lesion demonstrates fairly avid root restricted diffusion. No significant associated enhancement or regional mass effect. Finding favored to reflect an evolving subacute hematoma, new as compared to prior MRI. Finding is difficult to see on prior CT performed earlier the same day. No other findings to suggest acute or subacute intracranial hemorrhage. Multiple additional chronic microhemorrhages noted, predominantly clustered about the thalami, favored to  be related to chronic underlying poorly controlled hypertension given distribution. This additional finding suggests that the left cerebellar hematoma may be hypertensive in nature as well. No mass lesion, mass effect, or midline shift. No hydrocephalus or extra-axial fluid collection. No other abnormal enhancement. Pituitary gland and suprasellar region within normal limits. Midline structures intact. Vascular: Major intracranial vascular flow voids are maintained. Skull and upper cervical spine: Craniocervical junction within normal limits. Bone marrow signal intensity normal.  No scalp soft tissue abnormality. Sinuses/Orbits: Globes and orbital soft tissues within normal limits. Left maxillary sinus retention cyst. Paranasal sinuses are otherwise clear. No mastoid effusion. Other: None. MRA HEAD FINDINGS ANTERIOR CIRCULATION: Examination moderately degraded by motion artifact. Visualized distal cervical segments of the internal carotid arteries are patent with symmetric antegrade flow. Petrous segments patent bilaterally. Scattered atheromatous change noted throughout the carotid siphons, grossly similar to previous CTA. A1 segments patent bilaterally. Normal anterior communicating artery complex. Left ACA widely patent. Right A2 segment hypoplastic and diminutive, but remains grossly patent. M1 segments patent bilaterally. Normal MCA bifurcations. Distal MCA branches well perfused and symmetric. POSTERIOR CIRCULATION: Vertebral arteries patent to the vertebrobasilar junction without stenosis. Left PICA patent at its origin. Right PICA not seen. Basilar grossly patent to its distal aspect without appreciable stenosis. Superior cerebral arteries patent bilaterally. Both PCAs primarily supplied via the basilar. Scattered atheromatous change about the PCAs bilaterally without occlusion, grossly similar to previous. No appreciable aneurysm. IMPRESSION: MRI HEAD IMPRESSION: 1. 1.4 x 2.3 x 1.7 cm well-circumscribed  lobulated T1/FLAIR hyperintense lesion involving the left cerebellum, favored to reflect an evolving subacute hematoma, new as compared to prior MRI. No significant regional mass effect. Finding suspected to be hypertensive in nature. 2. Interval expansion and enlargement of previously identified right ACA territory infarct, now moderate in size and acute to subacute in appearance. No significant mass effect. 3. Few additional scattered acute to subacute ischemic nonhemorrhagic infarcts involving the subcortical posterior left frontal region, right periatrial white matter, and splenium of the corpus callosum. 4. Underlying age-related cerebral atrophy with advanced chronic microvascular ischemic disease. 5. Multiple chronic microhemorrhages, predominantly clustered about the thalami, favored to be related to chronic poorly controlled hypertension given distribution. 6. Underlying atrophy with advanced chronic small vessel ischemic disease. MRA HEAD IMPRESSION: 1. Motion degraded exam. 2. Grossly stable appearance of the intracranial circulation. No large vessel occlusion. Scattered atheromatous change about the carotid siphons, right ACA, and PCAs. No definite new or progressive finding. Electronically Signed   By: Jeannine Boga M.D.   On: 06/13/2020 23:29   MR BRAIN W WO CONTRAST  Result Date: 06/13/2020 CLINICAL DATA:  Initial evaluation for neuro deficit, stroke suspected. History of recent right ACA territory infarct. EXAM: MRI HEAD WITHOUT AND WITH CONTRAST MRA HEAD WITHOUT CONTRAST TECHNIQUE: Multiplanar, multiecho pulse sequences of the brain and surrounding structures were obtained without and with intravenous contrast. Angiographic images of the head were obtained using MRA technique without contrast. CONTRAST:  6.46mL GADAVIST GADOBUTROL 1 MMOL/ML IV SOLN COMPARISON:  Comparison made with CT from earlier same day as well as previous CTA and MRI from 04/27/2020. FINDINGS: MRI HEAD FINDINGS Brain:  Diffuse prominence of the CSF containing spaces compatible with generalized age-related cerebral atrophy. Patchy and confluent T2/FLAIR hyperintensity within the periventricular and deep white matter both cerebral hemispheres most consistent with chronic small vessel ischemic disease, advanced in nature. Patchy involvement of the deep gray nuclei, brainstem, and cerebellum noted as well. Few scattered remote lacunar infarcts present about the hemispheric cerebral white matter and thalami. 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 Associated enhancement seen following contrast administration. No associated hemorrhage or significant mass effect. Few additional scattered foci of restricted diffusion involving the subcortical posterior left frontal region, right periatrial white matter, and splenium of the corpus callosum also consistent  with acute to subacute ischemic infarcts. No associated hemorrhage. These findings are new as compared to prior exam. 1.4 x 2.3 x 1.7 cm well-circumscribed lobulated T1/FLAIR hyperintense lesion seen involving the left cerebellum (series 4, image 7). Lesion demonstrates fairly avid root restricted diffusion. No significant associated enhancement or regional mass effect. Finding favored to reflect an evolving subacute hematoma, new as compared to prior MRI. Finding is difficult to see on prior CT performed earlier the same day. No other findings to suggest acute or subacute intracranial hemorrhage. Multiple additional chronic microhemorrhages noted, predominantly clustered about the thalami, favored to be related to chronic underlying poorly controlled hypertension given distribution. This additional finding suggests that the left cerebellar hematoma may be hypertensive in nature as  well. No mass lesion, mass effect, or midline shift. No hydrocephalus or extra-axial fluid collection. No other abnormal enhancement. Pituitary gland and suprasellar region within normal limits. Midline structures intact. Vascular: Major intracranial vascular flow voids are maintained. Skull and upper cervical spine: Craniocervical junction within normal limits. Bone marrow signal intensity normal. No scalp soft tissue abnormality. Sinuses/Orbits: Globes and orbital soft tissues within normal limits. Left maxillary sinus retention cyst. Paranasal sinuses are otherwise clear. No mastoid effusion. Other: None. MRA HEAD FINDINGS ANTERIOR CIRCULATION: Examination moderately degraded by motion artifact. Visualized distal cervical segments of the internal carotid arteries are patent with symmetric antegrade flow. Petrous segments patent bilaterally. Scattered atheromatous change noted throughout the carotid siphons, grossly similar to previous CTA. A1 segments patent bilaterally. Normal anterior communicating artery complex. Left ACA widely patent. Right A2 segment hypoplastic and diminutive, but remains grossly patent. M1 segments patent bilaterally. Normal MCA bifurcations. Distal MCA branches well perfused and symmetric. POSTERIOR CIRCULATION: Vertebral arteries patent to the vertebrobasilar junction without stenosis. Left PICA patent at its origin. Right PICA not seen. Basilar grossly patent to its distal aspect without appreciable stenosis. Superior cerebral arteries patent bilaterally. Both PCAs primarily supplied via the basilar. Scattered atheromatous change about the PCAs bilaterally without occlusion, grossly similar to previous. No appreciable aneurysm. IMPRESSION: MRI HEAD IMPRESSION: 1. 1.4 x 2.3 x 1.7 cm well-circumscribed lobulated T1/FLAIR hyperintense lesion involving the left cerebellum, favored to reflect an evolving subacute hematoma, new as compared to prior MRI. No significant regional mass effect.  Finding suspected to be hypertensive in nature. 2. Interval expansion and enlargement of previously identified right ACA territory infarct, now moderate in size and acute to subacute in appearance. No significant mass effect. 3. Few additional scattered acute to subacute ischemic nonhemorrhagic infarcts involving the subcortical posterior left frontal region, right periatrial white matter, and splenium of the corpus callosum. 4. Underlying age-related cerebral atrophy with advanced chronic microvascular ischemic disease. 5. Multiple chronic microhemorrhages, predominantly clustered about the thalami, favored to be related to chronic poorly controlled hypertension given distribution. 6. Underlying atrophy with advanced chronic small vessel ischemic disease. MRA HEAD IMPRESSION: 1. Motion degraded exam. 2. Grossly stable appearance of the intracranial circulation. No large vessel occlusion. Scattered atheromatous change about the carotid siphons, right ACA, and PCAs. No definite new or progressive finding. Electronically Signed   By: Jeannine Boga M.D.   On: 06/13/2020 23:29   MR PELVIS WO CONTRAST  Result Date: 05/28/2020 CLINICAL DATA:  Pelvic pain. EXAM: MRI PELVIS WITHOUT CONTRAST TECHNIQUE: Multiplanar multisequence MR imaging of the pelvis was performed. No intravenous contrast was administered. COMPARISON:  None. FINDINGS: Bones: No hip fracture, dislocation or avascular necrosis. No periosteal reaction or bone destruction. No aggressive osseous lesion. Normal sacrum and sacroiliac  joints. No SI joint widening or erosive changes. Degenerative disc disease with disc height loss at L5-S1. minimal grade 1 anterolisthesis of L4 on L5 secondary to moderate bilateral facet arthropathy. Bilateral foraminal narrowing at L4-5 with mild spinal stenosis. Articular cartilage and labrum Articular cartilage:  No chondral defect. Labrum: Grossly intact, but evaluation is limited by lack of intraarticular fluid.  Joint or bursal effusion Joint effusion:  No hip joint effusion.  No SI joint effusion. Bursae:  No bursa formation. Muscles and tendons Flexors: Normal. Extensors: Normal. Abductors: Normal. Adductors: Mild muscle edema in the left adductor musculature likely reflecting muscle strain versus myositis. Gluteals: Mild muscle edema in the gluteus minimus and medius muscle bilaterally which may reflect mild myositis or muscle strain, left greater than right. Hamstrings: Normal. Other findings No pelvic free fluid. No fluid collection or hematoma. No inguinal lymphadenopathy. No inguinal hernia. Soft tissue edema in the subcutaneous fat superficial to the gluteus maximus muscle bilaterally likely reflecting contusions or fat necrosis. IMPRESSION: 1. No hip fracture, dislocation or avascular necrosis. 2. Mild muscle edema in the left adductor musculature likely reflecting muscle strain versus myositis. 3. Mild muscle edema in the gluteus minimus and medius muscle bilaterally which may reflect mild myositis or muscle strain, left greater than right. 4. Soft tissue edema in the subcutaneous fat superficial to the gluteus maximus muscle bilaterally likely reflecting contusions or fat necrosis. Electronically Signed   By: Kathreen Devoid   On: 05/28/2020 09:58   CT CHEST ABDOMEN PELVIS W CONTRAST  Result Date: 06/17/2020 CLINICAL DATA:  Stroke.  Evaluate for embolic source. EXAM: CT CHEST, ABDOMEN, AND PELVIS WITH CONTRAST TECHNIQUE: Multidetector CT imaging of the chest, abdomen and pelvis was performed following the standard protocol during bolus administration of intravenous contrast. CONTRAST:  164mL OMNIPAQUE IOHEXOL 300 MG/ML  SOLN COMPARISON:  None. FINDINGS: CT CHEST FINDINGS Cardiovascular: Heart is enlarged. Coronary artery calcification is evident. Atherosclerotic calcification is noted in the wall of the thoracic aorta. No CT evidence for filling defect in either atrial appendage or ventricular apex to suggest  thrombus. Aortic arch vessels are widely patent proximally. Mediastinum/Nodes: No mediastinal lymphadenopathy. There is no hilar lymphadenopathy. The esophagus has normal imaging features. There is no axillary lymphadenopathy. Lungs/Pleura: 4 mm right middle lobe nodule identified on 61/12. No focal airspace consolidation. No pleural effusion. Musculoskeletal: No worrisome lytic or sclerotic osseous abnormality. CT ABDOMEN PELVIS FINDINGS Hepatobiliary: No suspicious focal abnormality within the liver parenchyma. Gallbladder is surgically absent. No intrahepatic or extrahepatic biliary dilation. Pancreas: No focal mass lesion. No dilatation of the main duct. No intraparenchymal cyst. No peripancreatic edema. Spleen: No splenomegaly. No focal mass lesion. Adrenals/Urinary Tract: No adrenal nodule or mass. 2.4 x 1.7 cm bilobed low-density lesion versus 2 adjacent lesions noted anterior lower pole right kidney. Due to artifact from the patient's arms, this lesion cannot be definitively characterized but has attenuation too high to be a simple cyst. No focal gross mass lesion noted left kidney. No evidence for hydroureter. The urinary bladder appears normal for the degree of distention. Stomach/Bowel: Stomach is unremarkable. No gastric wall thickening. No evidence of outlet obstruction. Duodenum is normally positioned as is the ligament of Treitz. No small bowel wall thickening. No small bowel dilatation. The terminal ileum is normal. The appendix is not visualized, but there is no edema or inflammation in the region of the cecum. No gross colonic mass. No colonic wall thickening. Vascular/Lymphatic: No abdominal aortic aneurysm. Mild atherosclerotic changes noted abdominal aorta. The  portal vein, superior mesenteric vein and splenic vein are patent. There is no gastrohepatic or hepatoduodenal ligament lymphadenopathy. No retroperitoneal or mesenteric lymphadenopathy. No pelvic sidewall lymphadenopathy. Reproductive:  The uterus is unremarkable.  There is no adnexal mass. Other: No intraperitoneal free fluid. Musculoskeletal: No worrisome lytic or sclerotic osseous abnormality. IMPRESSION: 1. No findings to explain the patient's history of embolic stroke. 2. 4 mm right middle lobe pulmonary nodule. No follow-up needed if patient is low-risk. Non-contrast chest CT can be considered in 12 months if patient is high-risk. This recommendation follows the consensus statement: Guidelines for Management of Incidental Pulmonary Nodules Detected on CT Images: From the Fleischner Society 2017; Radiology 2017; 284:228-243. 3. 2.4 x 1.7 cm bilobed low-density lesion versus 2 adjacent lesions anterior lower pole right kidney. Due to artifact from the patient's arms, this lesion cannot be definitively characterized but has attenuation too high to be a simple cyst. While likely a cyst complicated by proteinaceous debris or hemorrhage, follow-up MRI recommended to further evaluate. MRI should be deferred until the patient is able to adequately participate with breath holding and positioning. 4. Aortic Atherosclerosis (ICD10-I70.0). Electronically Signed   By: Misty Stanley M.D.   On: 06/17/2020 06:31   DG CHEST PORT 1 VIEW  Result Date: 05/22/2020 CLINICAL DATA:  Persistent cough EXAM: PORTABLE CHEST 1 VIEW COMPARISON:  09/19/2015 FINDINGS: No focal opacity or pleural effusion. Mild cardiomegaly. Aortic atherosclerosis. No pneumothorax. Skin fold artifact over the right lower chest. IMPRESSION: No active disease.  Mild cardiomegaly. Electronically Signed   By: Donavan Foil M.D.   On: 05/22/2020 19:47   ECHOCARDIOGRAM COMPLETE  Result Date: 06/14/2020    ECHOCARDIOGRAM REPORT   Patient Name:   [name] Date of Exam: 06/14/2020 Medical Rec #:  [id number]           Height:       63.0 in Accession #:    [id number]          Weight:       145.0 lb Date of Birth:  04/13/1961           BSA:          1.687 m Patient Age:    86 years             BP:           190/105 mmHg Patient Gender: F                   HR:           75 bpm. Exam Location:  Inpatient Procedure: 2D Echo, Cardiac Doppler and Color Doppler Indications:    Stroke 434.91 / I163.9  History:        Patient has prior history of Echocardiogram examinations, most                 recent 04/27/2020. Stroke; Risk Factors:Hypertension.  Sonographer:    Bernadene Person RDCS Referring Phys: [id number] Cleaster Corin PATEL  Sonographer Comments: patient on right side IMPRESSIONS  1. Intracavitary gradient, peak velocity 2.38 m/s. Peak gradient 22.7 mmHg.Marland Kitchen Left ventricular ejection fraction, by estimation, is 60 to 65%. The left ventricle has normal function. The left ventricle has no regional wall motion abnormalities. There is mild concentric left ventricular hypertrophy. Left ventricular diastolic parameters are consistent with Grade I diastolic dysfunction (impaired relaxation). Elevated left ventricular end-diastolic pressure.  2. Right ventricular systolic function is normal. The right ventricular size is normal. There is normal  pulmonary artery systolic pressure.  3. Left atrial size was mildly dilated.  4. The mitral valve is normal in structure. No evidence of mitral valve regurgitation. No evidence of mitral stenosis.  5. The aortic valve is normal in structure. Aortic valve regurgitation is not visualized. No aortic stenosis is present.  6. The inferior vena cava is normal in size with greater than 50% respiratory variability, suggesting right atrial pressure of 3 mmHg. FINDINGS  Left Ventricle: Intracavitary gradient, peak velocity 2.38 m/s. Peak gradient 22.7 mmHg. Left ventricular ejection fraction, by estimation, is 60 to 65%. The left ventricle has normal function. The left ventricle has no regional wall motion abnormalities. The left ventricular internal cavity size was normal in size. There is mild concentric left ventricular hypertrophy. Left ventricular diastolic parameters are consistent  with Grade I diastolic dysfunction (impaired relaxation). Elevated left ventricular end-diastolic pressure. Right Ventricle: The right ventricular size is normal. No increase in right ventricular wall thickness. Right ventricular systolic function is normal. There is normal pulmonary artery systolic pressure. The tricuspid regurgitant velocity is 1.24 m/s, and  with an assumed right atrial pressure of 3 mmHg, the estimated right ventricular systolic pressure is 9.2 mmHg. Left Atrium: Left atrial size was mildly dilated. Right Atrium: Right atrial size was normal in size. Pericardium: There is no evidence of pericardial effusion. Mitral Valve: The mitral valve is normal in structure. No evidence of mitral valve regurgitation. No evidence of mitral valve stenosis. Tricuspid Valve: The tricuspid valve is normal in structure. Tricuspid valve regurgitation is trivial. No evidence of tricuspid stenosis. Aortic Valve: The aortic valve is normal in structure. Aortic valve regurgitation is not visualized. No aortic stenosis is present. Pulmonic Valve: The pulmonic valve was normal in structure. Pulmonic valve regurgitation is not visualized. No evidence of pulmonic stenosis. Aorta: The aortic root is normal in size and structure. Venous: The inferior vena cava is normal in size with greater than 50% respiratory variability, suggesting right atrial pressure of 3 mmHg. IAS/Shunts: No atrial level shunt detected by color flow Doppler.  LEFT VENTRICLE PLAX 2D LVIDd:         4.88 cm  Diastology LVIDs:         3.47 cm  LV e' medial:    3.65 cm/s LV PW:         1.23 cm  LV E/e' medial:  15.3 LV IVS:        1.07 cm  LV e' lateral:   3.97 cm/s LVOT diam:     1.90 cm  LV E/e' lateral: 14.1 LV SV:         73 LV SV Index:   44 LVOT Area:     2.84 cm  RIGHT VENTRICLE RV S prime:     15.80 cm/s TAPSE (M-mode): 1.7 cm LEFT ATRIUM             Index       RIGHT ATRIUM           Index LA diam:        3.50 cm 2.08 cm/m  RA Area:     13.20  cm LA Vol (A2C):   59.9 ml 35.51 ml/m RA Volume:   29.70 ml  17.61 ml/m LA Vol (A4C):   40.1 ml 23.78 ml/m LA Biplane Vol: 50.2 ml 29.76 ml/m  AORTIC VALVE LVOT Vmax:   155.00 cm/s LVOT Vmean:  121.000 cm/s LVOT VTI:    0.259 m  AORTA Ao Root diam: 2.70 cm  Ao Asc diam:  2.60 cm MITRAL VALVE               TRICUSPID VALVE MV Area (PHT): 2.50 cm    TR Peak grad:   6.2 mmHg MV Decel Time: 303 msec    TR Vmax:        124.00 cm/s MV E velocity: 56.00 cm/s MV A velocity: 74.40 cm/s  SHUNTS MV E/A ratio:  0.75        Systemic VTI:  0.26 m                            Systemic Diam: 1.90 cm Skeet Latch MD Electronically signed by Skeet Latch MD Signature Date/Time: 06/14/2020/1:15:36 PM    Final    ECHO TEE  Result Date: 06/15/2020    TRANSESOPHOGEAL ECHO REPORT   Patient Name:   [name] Date of Exam: 06/15/2020 Medical Rec #:  [id number]           Height:       63.0 in Accession #:    [id number]          Weight:       145.0 lb Date of Birth:  09-16-1960           BSA:          1.687 m Patient Age:    39 years            BP:           180/76 mmHg Patient Gender: F                   HR:           78 bpm. Exam Location:  Inpatient Procedure: Cardiac Doppler, Color Doppler and Transesophageal Echo Indications:     Stroke  History:         Patient has prior history of Echocardiogram examinations, most                  recent 06/14/2020. CHF; Risk Factors:Hypertension. H/O CVA. CKD.  Sonographer:     Clayton Lefort RDCS (AE) Referring Phys:  [place] Phys: Rudean Haskell MD PROCEDURE: After discussion of the risks and benefits of a TEE, an informed consent was obtained from the patient. The transesophogeal probe was passed without difficulty through the esophogus of the patient. Local oropharyngeal anesthetic was provided with Cetacaine. Sedation performed by different physician. The patient was monitored while under deep sedation. Anesthestetic sedation was provided intravenously  by Anesthesiology: 151.86mg  of Propofol, 100mg  of Lidocaine. Image quality was good. The patient developed no complications during the procedure. IMPRESSIONS  1. Left ventricular ejection fraction, by estimation, is 60 to 65%. The left ventricle has normal function. The left ventricle has no regional wall motion abnormalities. Left ventricular diastolic function could not be evaluated.  2. Right ventricular systolic function is normal. The right ventricular size is normal.  3. Left atrial size was mildly dilated. No left atrial/left atrial appendage thrombus was detected.  4. The mitral valve is normal in structure. Mild mitral valve regurgitation.  5. The aortic valve is tricuspid. Aortic valve regurgitation is not visualized.  6. There is mild (Grade II) plaque.  7. Agitated saline contrast bubble study was negative, with no evidence of any interatrial shunt. Conclusion(s)/Recommendation(s): No Evidence of intracardiac thrombus. There is some evidence of concentric hypertrophy in the mid and apex without apical  aneurysm. Consider outpatient, TTE with contrast, in follow up if clinically indicated. FINDINGS  Left Ventricle: There is evidence of midventricular and apical hypertrophy, best observed in deep gastric imaging. No denfinitive evidence for apical hyperrophy (HCM Variant). Left ventricular ejection fraction, by estimation, is 60 to 65%. The left ventricle has normal function. The left ventricle has no regional wall motion abnormalities. The left ventricular internal cavity size was normal in size. Left ventricular diastolic function could not be evaluated. Right Ventricle: The right ventricular size is normal. No increase in right ventricular wall thickness. Right ventricular systolic function is normal. Left Atrium: Left atrial size was mildly dilated. No left atrial/left atrial appendage thrombus was detected. Right Atrium: Prominent Eustachain ridge. Right atrial size was normal in size. Pericardium:  Trivial pericardial effusion is present. Mitral Valve: The mitral valve is normal in structure. Mild mitral valve regurgitation. Tricuspid Valve: The tricuspid valve is normal in structure. Tricuspid valve regurgitation is trivial. Aortic Valve: The aortic valve is tricuspid. Aortic valve regurgitation is not visualized. Pulmonic Valve: The pulmonic valve was grossly normal. Pulmonic valve regurgitation is not visualized. Aorta: The aortic root and ascending aorta are structurally normal, with no evidence of dilitation. There is mild (Grade II) plaque. IAS/Shunts: No atrial level shunt detected by color flow Doppler. Agitated saline contrast was given intravenously to evaluate for intracardiac shunting. Agitated saline contrast bubble study was negative, with no evidence of any interatrial shunt.  LEFT VENTRICLE PLAX 2D LVOT diam:     2.10 cm LVOT Area:     3.46 cm   AORTA Ao Root diam: 2.80 cm Ao Asc diam:  2.90 cm  SHUNTS Systemic Diam: 2.10 cm Rudean Haskell MD Electronically signed by Rudean Haskell MD Signature Date/Time: 06/15/2020/1:18:01 PM    Final    CT HEAD CODE STROKE WO CONTRAST  Result Date: 06/13/2020 CLINICAL DATA:  Code stroke. Initial evaluation for acute left facial droop, slurred speech. Patient has a history of a recent right ACA territory infarct. EXAM: CT HEAD WITHOUT CONTRAST TECHNIQUE: Contiguous axial images were obtained from the base of the skull through the vertex without intravenous contrast. COMPARISON:  Prior CT from 09/19/2015. Additionally, there is a previous brain MRI from 04/27/2020, reviewed in epic, but not available for review in PACS. FINDINGS: Brain: Generalized age-related cerebral atrophy. Patchy and confluent hypodensity involving the periventricular deep white matter of both cerebral hemispheres most consistent with chronic small vessel ischemic disease. Patchy involvement of the deep gray nuclei, brainstem, and cerebellum noted as well. Appearance is  fairly advanced in nature. Few scattered remote lacunar infarcts noted about the bilateral thalami. There is cortical and subcortical hypodensity involving the parasagittal right frontoparietal region, consistent with right ACA territory ischemia. Finding corresponds with previously identified right ACA territory infarct. However, appearance is somewhat more prominent on current exam as compared to previous brain MRI, raising the possibility for possible interval expansion and/or new acute component. Punctate hyperdensity at the posterior aspect of this area of ischemia suspicious for a small focus of associated petechial hemorrhage (series 3, image 23). No frank hemorrhagic transformation or mass effect. No other evidence for acute large vessel territory infarct. Gray-white matter differentiation otherwise grossly maintained. No mass lesion or midline shift. No hydrocephalus or extra-axial fluid collection. Vascular: No hyperdense vessel. Scattered vascular calcifications noted within the carotid siphons. Skull: Scalp soft tissues and calvarium within normal limits. Sinuses/Orbits: Globes and orbital soft tissues demonstrate no acute finding. Left maxillary sinus retention cyst noted. Paranasal sinuses are  otherwise largely clear. No mastoid effusion. Other: Poor dentition noted. ASPECTS [facility] Stroke Program Early CT Score) - Ganglionic level infarction (caudate, lentiform nuclei, internal capsule, insula, M1-M3 cortex): 7 - Supraganglionic infarction (M4-M6 cortex): 3 Total score (0-10 with 10 being normal): 10 IMPRESSION: 1. Cortical and subcortical hypodensity involving the parasagittal right frontoparietal region, consistent with previously identified right ACA territory infarct. Appearance is somewhat more prominent on current exam as compared to previous brain MRI, raising the possibility for possible interval expansion and/or new acute component. Punctate hyperdensity at the posterior aspect of this area  of ischemia suspicious for a small focus of associated petechial hemorrhage. No frank hemorrhagic transformation or mass effect. 2. ASPECTS is 10. 3. 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 These results were communicated to Dr. Rory Percy at 9:35 pmon 10/4/2021by text page via the [facility] messaging system. Findings also discussed by telephone with Dr. Rory Percy at time of dictation. Electronically Signed   By: Jeannine Boga M.D.   On: 06/13/2020 21:49   VAS [place] LOWER EXTREMITY VENOUS (DVT)  Result Date: 06/14/2020  Lower Venous DVTStudy Indications: Stroke.  Comparison Study: no prior Performing Technologist: Abram Sander RVS  Examination Guidelines: A complete evaluation includes B-mode imaging, spectral Doppler, color Doppler, and power Doppler as needed of all accessible portions of each vessel. Bilateral testing is considered an integral part of a complete examination. Limited examinations for reoccurring indications may be performed as noted. The reflux portion of the exam is performed with the patient in reverse Trendelenburg.  +---------+---------------+---------+-----------+----------+--------------+ RIGHT    CompressibilityPhasicitySpontaneityPropertiesThrombus Aging +---------+---------------+---------+-----------+----------+--------------+ CFV      Full           Yes      Yes                                 +---------+---------------+---------+-----------+----------+--------------+ SFJ      Full                                                        +---------+---------------+---------+-----------+----------+--------------+ FV Prox  Full                                                        +---------+---------------+---------+-----------+----------+--------------+ FV Mid   Full                                                        +---------+---------------+---------+-----------+----------+--------------+ FV  DistalFull                                                        +---------+---------------+---------+-----------+----------+--------------+ PFV      Full                                                        +---------+---------------+---------+-----------+----------+--------------+  POP      Full           Yes      Yes                                 +---------+---------------+---------+-----------+----------+--------------+ PTV      Full                                                        +---------+---------------+---------+-----------+----------+--------------+ PERO     Full                                                        +---------+---------------+---------+-----------+----------+--------------+   +---------+---------------+---------+-----------+----------+--------------+ LEFT     CompressibilityPhasicitySpontaneityPropertiesThrombus Aging +---------+---------------+---------+-----------+----------+--------------+ CFV      Full           Yes      Yes                                 +---------+---------------+---------+-----------+----------+--------------+ SFJ      Full                                                        +---------+---------------+---------+-----------+----------+--------------+ FV Prox  Full                                                        +---------+---------------+---------+-----------+----------+--------------+ FV Mid   Full                                                        +---------+---------------+---------+-----------+----------+--------------+ FV DistalFull                                                        +---------+---------------+---------+-----------+----------+--------------+ PFV      Full                                                        +---------+---------------+---------+-----------+----------+--------------+ POP      Full           Yes      Yes                                  +---------+---------------+---------+-----------+----------+--------------+  PTV      Full                                                        +---------+---------------+---------+-----------+----------+--------------+ PERO     Full                                                        +---------+---------------+---------+-----------+----------+--------------+     Summary: BILATERAL: - No evidence of deep vein thrombosis seen in the lower extremities, bilaterally. - No evidence of superficial venous thrombosis in the lower extremities, bilaterally. -   *See table(s) above for measurements and observations. Electronically signed by Servando Snare MD on 06/14/2020 at 12:16:46 PM.    Final    DG FL GUIDED LUMBAR PUNCTURE  Result Date: 06/16/2020 CLINICAL DATA:  Cryptogenic stroke. EXAM: DIAGNOSTIC LUMBAR PUNCTURE UNDER FLUOROSCOPIC GUIDANCE FLUOROSCOPY TIME:  Fluoroscopy Time:  2 minutes and 6 seconds Radiation Exposure Index (if provided by the fluoroscopic device): Not applicable. Number of Acquired Spot Images: 1 PROCEDURE: Informed consent was obtained from the patient prior to the procedure, including potential complications of headache, and pain. A "time out" was performed. With the patient prone, the lower back was prepped with Betadine. 1% Lidocaine was used for local anesthesia. Lumbar puncture was performed at the L3-4 level using a 20 gauge needle with return of clear CSF with an opening pressure of 20 cm water. 10 ml of CSF were obtained for laboratory studies. The patient tolerated the procedure well and there were no apparent complications. IMPRESSION: Non complicated lumbar puncture as detailed above. Electronically Signed   By: Abigail Miyamoto M.D.   On: 06/16/2020 10:21   ECHOCARDIOGRAM IMPRESSIONS    1. Intracavitary gradient, peak velocity 2.38 m/s. Peak gradient 22.7  mmHg.Marland Kitchen Left ventricular ejection fraction, by estimation, is 60 to 65%.  The left  ventricle has normal function. The left ventricle has no regional  wall motion abnormalities. There is  mild concentric left ventricular hypertrophy. Left ventricular diastolic  parameters are consistent with Grade I diastolic dysfunction (impaired  relaxation). Elevated left ventricular end-diastolic pressure.  2. Right ventricular systolic function is normal. The right ventricular  size is normal. There is normal pulmonary artery systolic pressure.  3. Left atrial size was mildly dilated.  4. The mitral valve is normal in structure. No evidence of mitral valve  regurgitation. No evidence of mitral stenosis.  5. The aortic valve is normal in structure. Aortic valve regurgitation is  not visualized. No aortic stenosis is present.  6. The inferior vena cava is normal in size with greater than 50%  respiratory variability, suggesting right atrial pressure of 3 mmHg.   FINDINGS  Left Ventricle: Intracavitary gradient, peak velocity 2.38 m/s. Peak  gradient 22.7 mmHg. Left ventricular ejection fraction, by estimation, is  60 to 65%. The left ventricle has normal function. The left ventricle has  no regional wall motion  abnormalities. The left ventricular internal cavity size was normal in  size. There is mild concentric left ventricular hypertrophy. Left  ventricular diastolic parameters are consistent with Grade I diastolic  dysfunction (impaired relaxation). Elevated  left ventricular end-diastolic pressure.  Right Ventricle: The right ventricular size is normal. No increase in  right ventricular wall thickness. Right ventricular systolic function is  normal. There is normal pulmonary artery systolic pressure. The tricuspid  regurgitant velocity is 1.24 m/s, and  with an assumed right atrial pressure of 3 mmHg, the estimated right  ventricular systolic pressure is 9.2 mmHg.   Left Atrium: Left atrial size was mildly dilated.   Right Atrium: Right atrial size was normal in  size.   Pericardium: There is no evidence of pericardial effusion.   Mitral Valve: The mitral valve is normal in structure. No evidence of  mitral valve regurgitation. No evidence of mitral valve stenosis.   Tricuspid Valve: The tricuspid valve is normal in structure. Tricuspid  valve regurgitation is trivial. No evidence of tricuspid stenosis.   Aortic Valve: The aortic valve is normal in structure. Aortic valve  regurgitation is not visualized. No aortic stenosis is present.   Pulmonic Valve: The pulmonic valve was normal in structure. Pulmonic valve  regurgitation is not visualized. No evidence of pulmonic stenosis.   Aorta: The aortic root is normal in size and structure.   Venous: The inferior vena cava is normal in size with greater than 50%  respiratory variability, suggesting right atrial pressure of 3 mmHg.   IAS/Shunts: No atrial level shunt detected by color flow Doppler.     LEFT VENTRICLE  PLAX 2D  LVIDd:     4.88 cm Diastology  LVIDs:     3.47 cm LV e' medial:  3.65 cm/s  LV PW:     1.23 cm LV E/e' medial: 15.3  LV IVS:    1.07 cm LV e' lateral:  3.97 cm/s  LVOT diam:   1.90 cm LV E/e' lateral: 14.1  LV SV:     73  LV SV Index:  44  LVOT Area:   2.84 cm     RIGHT VENTRICLE  RV S prime:   15.80 cm/s  TAPSE (M-mode): 1.7 cm   LEFT ATRIUM       Index    RIGHT ATRIUM      Index  LA diam:    3.50 cm 2.08 cm/m RA Area:   13.20 cm  LA Vol (A2C):  59.9 ml 35.51 ml/m RA Volume:  29.70 ml 17.61 ml/m  LA Vol (A4C):  40.1 ml 23.78 ml/m  LA Biplane Vol: 50.2 ml 29.76 ml/m  AORTIC VALVE  LVOT Vmax:  155.00 cm/s  LVOT Vmean: 121.000 cm/s  LVOT VTI:  0.259 m    AORTA  Ao Root diam: 2.70 cm  Ao Asc diam: 2.60 cm   MITRAL VALVE        TRICUSPID VALVE  MV Area (PHT): 2.50 cm  TR Peak grad:  6.2 mmHg  MV Decel Time: 303 msec  TR Vmax:    124.00 cm/s  MV E velocity: 56.00  cm/s  MV A velocity: 74.40 cm/s SHUNTS  MV E/A ratio: 0.75    Systemic VTI: 0.26 m               Systemic Diam: 1.90 cm   TEE IMPRESSIONS    1. Left ventricular ejection fraction, by estimation, is 60 to 65%. The  left ventricle has normal function. The left ventricle has no regional  wall motion abnormalities. Left ventricular diastolic function could not  be evaluated.  2. Right ventricular systolic function is normal. The right ventricular  size is normal.  3. Left atrial size was mildly dilated.  No left atrial/left atrial  appendage thrombus was detected.  4. The mitral valve is normal in structure. Mild mitral valve  regurgitation.  5. The aortic valve is tricuspid. Aortic valve regurgitation is not  visualized.  6. There is mild (Grade II) plaque.  7. Agitated saline contrast bubble study was negative, with no evidence  of any interatrial shunt.   Conclusion(s)/Recommendation(s): No Evidence of intracardiac thrombus.  There is some evidence of concentric hypertrophy in the mid and apex  without apical aneurysm. Consider outpatient, TTE with contrast, in follow  up if clinically indicated.   FINDINGS  Left Ventricle: There is evidence of midventricular and apical  hypertrophy, best observed in deep gastric imaging. No denfinitive  evidence for apical hyperrophy (HCM Variant). Left ventricular ejection  fraction, by estimation, is 60 to 65%. The left  ventricle has normal function. The left ventricle has no regional wall  motion abnormalities. The left ventricular internal cavity size was normal  in size. Left ventricular diastolic function could not be evaluated.   Right Ventricle: The right ventricular size is normal. No increase in  right ventricular wall thickness. Right ventricular systolic function is  normal.   Left Atrium: Left atrial size was mildly dilated. No left atrial/left  atrial appendage thrombus was detected.   Right  Atrium: Prominent Eustachain ridge. Right atrial size was normal in  size.   Pericardium: Trivial pericardial effusion is present.   Mitral Valve: The mitral valve is normal in structure. Mild mitral valve  regurgitation.   Tricuspid Valve: The tricuspid valve is normal in structure. Tricuspid  valve regurgitation is trivial.   Aortic Valve: The aortic valve is tricuspid. Aortic valve regurgitation is  not visualized.   Pulmonic Valve: The pulmonic valve was grossly normal. Pulmonic valve  regurgitation is not visualized.   Aorta: The aortic root and ascending aorta are structurally normal, with  no evidence of dilitation. There is mild (Grade II) plaque.   IAS/Shunts: No atrial level shunt detected by color flow Doppler. Agitated  saline contrast was given intravenously to evaluate for intracardiac  shunting. Agitated saline contrast bubble study was negative, with no  evidence of any interatrial shunt.     LEFT VENTRICLE  PLAX 2D  LVOT diam:   2.10 cm  LVOT Area:   3.46 cm       AORTA  Ao Root diam: 2.80 cm  Ao Asc diam: 2.90 cm     SHUNTS  Systemic Diam: 2.10 cm   Subjective: Seen and examined at bedside she is doing fairly well.  Denies chest pain, lightheadedness or dizziness.  No nausea or vomiting.  Still weak on the left side.  Eating her breakfast.  No other concerns at present time and stable to be discharged at this time.  Discharge Exam: Vitals:   06/18/20 1219 06/18/20 1416  BP: 132/80 129/79  Pulse: 79   Resp: 19   Temp: 99.2 F (37.3 C)   SpO2:     Vitals:   06/18/20 1016 06/18/20 1025 06/18/20 1219 06/18/20 1416  BP: (!) 166/87 (!) 163/88 132/80 129/79  Pulse: 82 85 79   Resp:  15 19   Temp:   99.2 F (37.3 C)   TempSrc:   Oral   SpO2:  98%     General: Pt is alert, awake, not in acute distress Cardiovascular: RRR, S1/S2 +, no rubs, no gallops Respiratory: Diminished to auscultation bilaterally, no wheezing, no rhonchi;  unlabored breathing and not wearing supplemental oxygen  via nasal cannula Abdominal: Soft, NT, ND, bowel sounds + Extremities: no edema, no cyanosis; has some left-sided hemiplegia and weakness  The results of significant diagnostics from this hospitalization (including imaging, microbiology, ancillary and laboratory) are listed below for reference.    Microbiology: Recent Results (from the past 240 hour(s))  Respiratory Panel by RT PCR (Flu A&B, Covid) - Nasopharyngeal Swab     Status: None   Collection Time: 06/14/20  1:38 AM   Specimen: Nasopharyngeal Swab  Result Value Ref Range Status   SARS Coronavirus 2 by RT PCR NEGATIVE NEGATIVE Final    Comment: (NOTE) SARS-CoV-2 target nucleic acids are NOT DETECTED.  The SARS-CoV-2 RNA is generally detectable in upper respiratoy specimens during the acute phase of infection. The lowest concentration of SARS-CoV-2 viral copies this assay can detect is 131 copies/mL. A negative result does not preclude SARS-Cov-2 infection and should not be used as the sole basis for treatment or other patient management decisions. A negative result may occur with  improper specimen collection/handling, submission of specimen other than nasopharyngeal swab, presence of viral mutation(s) within the areas targeted by this assay, and inadequate number of viral copies (<131 copies/mL). A negative result must be combined with clinical observations, patient history, and epidemiological information. The expected result is Negative.  Fact Sheet for Patients:  PinkCheek.be  Fact Sheet for Healthcare Providers:  GravelBags.it  This test is no t yet approved or cleared by the [place] FDA and  has been authorized for detection and/or diagnosis of SARS-CoV-2 by FDA under an Emergency Use Authorization (EUA). This EUA will remain  in effect (meaning this test can be used) for the duration of  the COVID-19 declaration under Section 564(b)(1) of the Act, 21 U.S.C. section 360bbb-3(b)(1), unless the authorization is terminated or revoked sooner.     Influenza A by PCR NEGATIVE NEGATIVE Final   Influenza B by PCR NEGATIVE NEGATIVE Final    Comment: (NOTE) The Xpert Xpress SARS-CoV-2/FLU/RSV assay is intended as an aid in  the diagnosis of influenza from Nasopharyngeal swab specimens and  should not be used as a sole basis for treatment. Nasal washings and  aspirates are unacceptable for Xpert Xpress SARS-CoV-2/FLU/RSV  testing.  Fact Sheet for Patients: PinkCheek.be  Fact Sheet for Healthcare Providers: GravelBags.it  This test is not yet approved or cleared by the [place] FDA and  has been authorized for detection and/or diagnosis of SARS-CoV-2 by  FDA under an Emergency Use Authorization (EUA). This EUA will remain  in effect (meaning this test can be used) for the duration of the  Covid-19 declaration under Section 564(b)(1) of the Act, 21  U.S.C. section 360bbb-3(b)(1), unless the authorization is  terminated or revoked. Performed at [facility], [place] [address] [place], [place] [address]   Gram stain     Status: None   Collection Time: 06/16/20  9:53 AM   Specimen: PATH Cytology CSF; Cerebrospinal Fluid  Result Value Ref Range Status   Specimen Description CSF  Final   Special Requests NONE  Final   Gram Stain   Final    CYTOSPIN SMEAR NO WBC SEEN NO ORGANISMS SEEN Performed at [facility], [place] [address] [place],  [address]    Report Status 06/16/2020 FINAL  Final    Labs: BNP (last 3 results) No results for input(s): BNP in the last 8760 hours. Basic Metabolic Panel: Recent Labs  Lab 06/13/20 2115 06/13/20 2115 06/13/20 2126 06/15/20 0318 06/15/20  9024 06/16/20 0444 06/17/20 0456 06/18/20 0147  NA 138   < > 139 138  --  142 139 141  K 3.8   < > 3.5 3.3*  --   3.7 3.3* 3.9  CL 104   < > 105 106  --  109 107 108  CO2 20*  --   --  22  --  20* 21* 22  GLUCOSE 85   < > 90 101*  --  96 103* 96  BUN 18   < > 19 18  --  $R'17 14 13  'gs$ CREATININE 1.34*   < > 1.30* 1.45*  --  1.43* 1.24* 1.33*  CALCIUM 10.3  --   --  9.8  --  9.8 9.9 10.1  MG  --   --   --   --  1.8 1.9 1.9 1.9  PHOS  --   --   --   --   --  3.3 3.7 4.2   < > = values in this interval not displayed.   Liver Function Tests: Recent Labs  Lab 06/13/20 2115 06/16/20 0444 06/17/20 0456 06/18/20 0147  AST $Re'20 19 20 18  'JEA$ ALT $R'26 24 24 24  'On$ ALKPHOS 83 69 71 72  BILITOT 0.6 0.6 0.5 0.4  PROT 7.8 6.6 6.5 7.1  ALBUMIN 3.4* 3.0* 2.9* 3.1*   No results for input(s): LIPASE, AMYLASE in the last 168 hours. No results for input(s): AMMONIA in the last 168 hours. CBC: Recent Labs  Lab 06/13/20 2115 06/13/20 2115 06/13/20 2126 06/15/20 0842 06/16/20 0444 06/17/20 0456 06/18/20 0147  WBC 8.7  --   --  5.9 8.1 6.9 7.5  NEUTROABS 5.7  --   --  3.7 6.0 4.4 4.8  HGB 12.2   < > 12.9 9.6* 9.6* 10.3* 11.1*  HCT 38.0   < > 38.0 30.3* 30.1* 31.4* 34.2*  MCV 89.8  --   --  87.6 88.8 88.5 87.7  PLT 179  --   --  157 158 166 167   < > = values in this interval not displayed.   Cardiac Enzymes: No results for input(s): CKTOTAL, CKMB, CKMBINDEX, TROPONINI in the last 168 hours. BNP: Invalid input(s): POCBNP CBG: Recent Labs  Lab 06/13/20 2115  GLUCAP 88   D-Dimer No results for input(s): DDIMER in the last 72 hours. Hgb A1c No results for input(s): HGBA1C in the last 72 hours. Lipid Profile No results for input(s): CHOL, HDL, LDLCALC, TRIG, CHOLHDL, LDLDIRECT in the last 72 hours. Thyroid function studies No results for input(s): TSH, T4TOTAL, T3FREE, THYROIDAB in the last 72 hours.  Invalid input(s): FREET3 Anemia work up Recent Labs    06/17/20 0456  VITAMINB12 1,342*  FOLATE 12.9  FERRITIN 79  TIBC 189*  IRON 49  RETICCTPCT 0.8   Urinalysis    Component Value Date/Time    COLORURINE YELLOW 06/15/2020 0355   APPEARANCEUR CLOUDY (A) 06/15/2020 0355   LABSPEC 1.017 06/15/2020 0355   PHURINE 5.0 06/15/2020 0355   [place] 06/15/2020 0355   HGBUR NEGATIVE 06/15/2020 0355   BILIRUBINUR NEGATIVE 06/15/2020 0355   KETONESUR 5 (A) 06/15/2020 0355   PROTEINUR NEGATIVE 06/15/2020 0355   UROBILINOGEN 0.2 10/09/2010 1518   NITRITE NEGATIVE 06/15/2020 0355   LEUKOCYTESUR NEGATIVE 06/15/2020 0355   Sepsis Labs Invalid input(s): PROCALCITONIN,  WBC,  LACTICIDVEN Microbiology Recent Results (from the past 240 hour(s))  Respiratory Panel by RT PCR (Flu A&B, Covid) - Nasopharyngeal Swab  Status: None   Collection Time: 06/14/20  1:38 AM   Specimen: Nasopharyngeal Swab  Result Value Ref Range Status   SARS Coronavirus 2 by RT PCR NEGATIVE NEGATIVE Final    Comment: (NOTE) SARS-CoV-2 target nucleic acids are NOT DETECTED.  The SARS-CoV-2 RNA is generally detectable in upper respiratoy specimens during the acute phase of infection. The lowest concentration of SARS-CoV-2 viral copies this assay can detect is 131 copies/mL. A negative result does not preclude SARS-Cov-2 infection and should not be used as the sole basis for treatment or other patient management decisions. A negative result may occur with  improper specimen collection/handling, submission of specimen other than nasopharyngeal swab, presence of viral mutation(s) within the areas targeted by this assay, and inadequate number of viral copies (<131 copies/mL). A negative result must be combined with clinical observations, patient history, and epidemiological information. The expected result is Negative.  Fact Sheet for Patients:  PinkCheek.be  Fact Sheet for Healthcare Providers:  GravelBags.it  This test is no t yet approved or cleared by the [place] FDA and  has been authorized for detection and/or diagnosis of SARS-CoV-2  by FDA under an Emergency Use Authorization (EUA). This EUA will remain  in effect (meaning this test can be used) for the duration of the COVID-19 declaration under Section 564(b)(1) of the Act, 21 U.S.C. section 360bbb-3(b)(1), unless the authorization is terminated or revoked sooner.     Influenza A by PCR NEGATIVE NEGATIVE Final   Influenza B by PCR NEGATIVE NEGATIVE Final    Comment: (NOTE) The Xpert Xpress SARS-CoV-2/FLU/RSV assay is intended as an aid in  the diagnosis of influenza from Nasopharyngeal swab specimens and  should not be used as a sole basis for treatment. Nasal washings and  aspirates are unacceptable for Xpert Xpress SARS-CoV-2/FLU/RSV  testing.  Fact Sheet for Patients: PinkCheek.be  Fact Sheet for Healthcare Providers: GravelBags.it  This test is not yet approved or cleared by the [place] FDA and  has been authorized for detection and/or diagnosis of SARS-CoV-2 by  FDA under an Emergency Use Authorization (EUA). This EUA will remain  in effect (meaning this test can be used) for the duration of the  Covid-19 declaration under Section 564(b)(1) of the Act, 21  U.S.C. section 360bbb-3(b)(1), unless the authorization is  terminated or revoked. Performed at [facility], [place] [address] [place], [place] [address]   Gram stain     Status: None   Collection Time: 06/16/20  9:53 AM   Specimen: PATH Cytology CSF; Cerebrospinal Fluid  Result Value Ref Range Status   Specimen Description CSF  Final   Special Requests NONE  Final   Gram Stain   Final    CYTOSPIN SMEAR NO WBC SEEN NO ORGANISMS SEEN Performed at [facility], [place] [address], [place], [place] [address]    Report Status 06/16/2020 FINAL  Final   Time coordinating discharge: 35 minutes  SIGNED:  Kerney Elbe, DO [facility] 06/18/2020, 3:32 PM Pager is on [place]  If 7PM-7AM, please contact  night-coverage [email or web address]

## 2020-06-18 NOTE — Progress Notes (Signed)
Patient being discharged to [facility]. RN attempted report x2 with no answer. IV removed. CCMD made aware of discharge. Leaving unit via PTAR.

## 2020-06-19 ENCOUNTER — Encounter (HOSPITAL_COMMUNITY): Payer: Self-pay | Admitting: Internal Medicine

## 2020-06-23 ENCOUNTER — Encounter: Payer: 59 | Admitting: Physical Medicine & Rehabilitation

## 2020-06-23 ENCOUNTER — Ambulatory Visit: Payer: 59 | Admitting: Physical Medicine & Rehabilitation

## 2020-06-29 ENCOUNTER — Ambulatory Visit: Payer: 59 | Admitting: Physician Assistant

## 2020-07-05 ENCOUNTER — Ambulatory Visit: Payer: 59 | Admitting: Physical Medicine & Rehabilitation

## 2020-07-06 NOTE — Telephone Encounter (Signed)
   Called Pt. No answer. LVM

## 2020-07-08 ENCOUNTER — Ambulatory Visit: Payer: 59 | Admitting: Physical Medicine and Rehabilitation

## 2020-08-15 ENCOUNTER — Telehealth: Payer: Self-pay | Admitting: *Deleted

## 2020-08-15 ENCOUNTER — Telehealth: Payer: Self-pay

## 2020-08-15 NOTE — Telephone Encounter (Signed)
I am really sorry to hear that.  I hope things turn out to be benign.

## 2020-08-15 NOTE — Telephone Encounter (Signed)
Copied from [place] [phone]. Topic: General - Other >> Aug 15, 2020 12:27 PM Keene Breath wrote: Reason for CRM: Patient's daughter called to inform Dr. Thereasa Solo that the patient has possibly had another stroke and she is currently at the rehab center where they are transferring her to the hospital.  Daughter stated she just wanted the doctor to know   This is an [place]

## 2020-08-15 NOTE — Telephone Encounter (Signed)
Patient's daughter called to inform Dr. Posey Pronto  that the patient has possibly had another stroke and she is currently at the rehab center where they are transferring her to the hospital.  Daughter stated she just wanted the doctor to know.[name]

## 2020-08-17 NOTE — Telephone Encounter (Signed)
Hi [name]!  I have never seen this patient. she has not established care at our office that I am aware of.  I think she does need to establish care and should be scheduled with one of our PCP doctors(not with me since I don't serve as PCP to anyone.)  Please facilitate her getting an appt with/assigned to one of our Full-time PCP if that is what the patient wants to do.  thanks, Freeman Caldron, PA-C

## 2020-08-17 NOTE — Telephone Encounter (Signed)
I have tried to contact patient to schedule a new patient appointment but was unsuccessful. I also could not leave a voicemail since the phone only ringed. If patient calls back please have them schedule a new patient appointment.

## 2020-09-05 ENCOUNTER — Encounter: Payer: Self-pay | Admitting: Neurology

## 2020-09-05 ENCOUNTER — Inpatient Hospital Stay: Payer: 59 | Admitting: Neurology

## 2021-08-07 ENCOUNTER — Emergency Department (HOSPITAL_COMMUNITY): Payer: 59

## 2021-08-07 ENCOUNTER — Other Ambulatory Visit: Payer: Self-pay

## 2021-08-07 ENCOUNTER — Inpatient Hospital Stay (HOSPITAL_COMMUNITY)
Admission: EM | Admit: 2021-08-07 | Discharge: 2021-08-10 | DRG: 640 | Disposition: A | Discharge status: Skilled Nursing Facility | Payer: 59 | Source: Skilled Nursing Facility | Attending: Internal Medicine | Admitting: Internal Medicine

## 2021-08-07 DIAGNOSIS — Y846 Urinary catheterization as the cause of abnormal reaction of the patient, or of later complication, without mention of misadventure at the time of the procedure: Secondary | ICD-10-CM | POA: Diagnosis present

## 2021-08-07 DIAGNOSIS — N319 Neuromuscular dysfunction of bladder, unspecified: Secondary | ICD-10-CM | POA: Diagnosis present

## 2021-08-07 DIAGNOSIS — I5042 Chronic combined systolic (congestive) and diastolic (congestive) heart failure: Secondary | ICD-10-CM | POA: Diagnosis present

## 2021-08-07 DIAGNOSIS — Z7401 Bed confinement status: Secondary | ICD-10-CM

## 2021-08-07 DIAGNOSIS — Z66 Do not resuscitate: Secondary | ICD-10-CM | POA: Diagnosis present

## 2021-08-07 DIAGNOSIS — G9341 Metabolic encephalopathy: Secondary | ICD-10-CM

## 2021-08-07 DIAGNOSIS — I130 Hypertensive heart and chronic kidney disease with heart failure and stage 1 through stage 4 chronic kidney disease, or unspecified chronic kidney disease: Secondary | ICD-10-CM | POA: Diagnosis present

## 2021-08-07 DIAGNOSIS — I69391 Dysphagia following cerebral infarction: Secondary | ICD-10-CM | POA: Diagnosis not present

## 2021-08-07 DIAGNOSIS — G8220 Paraplegia, unspecified: Secondary | ICD-10-CM | POA: Diagnosis present

## 2021-08-07 DIAGNOSIS — N1832 Chronic kidney disease, stage 3b: Secondary | ICD-10-CM | POA: Diagnosis present

## 2021-08-07 DIAGNOSIS — D696 Thrombocytopenia, unspecified: Secondary | ICD-10-CM | POA: Diagnosis present

## 2021-08-07 DIAGNOSIS — Z7902 Long term (current) use of antithrombotics/antiplatelets: Secondary | ICD-10-CM

## 2021-08-07 DIAGNOSIS — Z7982 Long term (current) use of aspirin: Secondary | ICD-10-CM

## 2021-08-07 DIAGNOSIS — Z20822 Contact with and (suspected) exposure to covid-19: Secondary | ICD-10-CM | POA: Diagnosis present

## 2021-08-07 DIAGNOSIS — R1310 Dysphagia, unspecified: Secondary | ICD-10-CM | POA: Diagnosis present

## 2021-08-07 DIAGNOSIS — G40909 Epilepsy, unspecified, not intractable, without status epilepticus: Secondary | ICD-10-CM

## 2021-08-07 DIAGNOSIS — D638 Anemia in other chronic diseases classified elsewhere: Secondary | ICD-10-CM | POA: Diagnosis present

## 2021-08-07 DIAGNOSIS — R4182 Altered mental status, unspecified: Secondary | ICD-10-CM

## 2021-08-07 DIAGNOSIS — Z8249 Family history of ischemic heart disease and other diseases of the circulatory system: Secondary | ICD-10-CM

## 2021-08-07 DIAGNOSIS — I69398 Other sequelae of cerebral infarction: Secondary | ICD-10-CM

## 2021-08-07 DIAGNOSIS — D509 Iron deficiency anemia, unspecified: Secondary | ICD-10-CM | POA: Diagnosis present

## 2021-08-07 DIAGNOSIS — E870 Hyperosmolality and hypernatremia: Secondary | ICD-10-CM | POA: Diagnosis present

## 2021-08-07 DIAGNOSIS — N390 Urinary tract infection, site not specified: Secondary | ICD-10-CM | POA: Diagnosis present

## 2021-08-07 DIAGNOSIS — E785 Hyperlipidemia, unspecified: Secondary | ICD-10-CM | POA: Diagnosis present

## 2021-08-07 DIAGNOSIS — I69369 Other paralytic syndrome following cerebral infarction affecting unspecified side: Secondary | ICD-10-CM

## 2021-08-07 DIAGNOSIS — F32A Depression, unspecified: Secondary | ICD-10-CM | POA: Diagnosis present

## 2021-08-07 DIAGNOSIS — F419 Anxiety disorder, unspecified: Secondary | ICD-10-CM | POA: Diagnosis present

## 2021-08-07 DIAGNOSIS — I10 Essential (primary) hypertension: Secondary | ICD-10-CM | POA: Diagnosis present

## 2021-08-07 DIAGNOSIS — T83511A Infection and inflammatory reaction due to indwelling urethral catheter, initial encounter: Secondary | ICD-10-CM | POA: Diagnosis present

## 2021-08-07 DIAGNOSIS — R471 Dysarthria and anarthria: Secondary | ICD-10-CM | POA: Diagnosis present

## 2021-08-07 DIAGNOSIS — Z79899 Other long term (current) drug therapy: Secondary | ICD-10-CM

## 2021-08-07 DIAGNOSIS — N179 Acute kidney failure, unspecified: Secondary | ICD-10-CM | POA: Diagnosis present

## 2021-08-07 DIAGNOSIS — Z931 Gastrostomy status: Secondary | ICD-10-CM

## 2021-08-07 DIAGNOSIS — I6930 Unspecified sequelae of cerebral infarction: Secondary | ICD-10-CM

## 2021-08-07 LAB — I-STAT CHEM 8, ED
BUN: 122 mg/dL — ABNORMAL HIGH (ref 6–20)
Calcium, Ion: 1.34 mmol/L (ref 1.15–1.40)
Chloride: 123 mmol/L — ABNORMAL HIGH (ref 98–111)
Creatinine, Ser: 1.4 mg/dL — ABNORMAL HIGH (ref 0.44–1.00)
Glucose, Bld: 69 mg/dL — ABNORMAL LOW (ref 70–99)
HCT: 28 % — ABNORMAL LOW (ref 36.0–46.0)
Hemoglobin: 9.5 g/dL — ABNORMAL LOW (ref 12.0–15.0)
Potassium: 4.3 mmol/L (ref 3.5–5.1)
Sodium: 162 mmol/L (ref 135–145)
TCO2: 32 mmol/L (ref 22–32)

## 2021-08-07 LAB — CBC
HCT: 31.9 % — ABNORMAL LOW (ref 36.0–46.0)
Hemoglobin: 9.7 g/dL — ABNORMAL LOW (ref 12.0–15.0)
MCH: 28.8 pg (ref 26.0–34.0)
MCHC: 30.4 g/dL (ref 30.0–36.0)
MCV: 94.7 fL (ref 80.0–100.0)
Platelets: 166 10*3/uL (ref 150–400)
RBC: 3.37 MIL/uL — ABNORMAL LOW (ref 3.87–5.11)
RDW: 17 % — ABNORMAL HIGH (ref 11.5–15.5)
WBC: 16.4 10*3/uL — ABNORMAL HIGH (ref 4.0–10.5)
nRBC: 0 % (ref 0.0–0.2)

## 2021-08-07 LAB — URINALYSIS, ROUTINE W REFLEX MICROSCOPIC
Bilirubin Urine: NEGATIVE
Glucose, UA: NEGATIVE mg/dL
Ketones, ur: NEGATIVE mg/dL
Nitrite: NEGATIVE
Protein, ur: 100 mg/dL — AB
Specific Gravity, Urine: 1.015 (ref 1.005–1.030)
WBC, UA: >50 WBC/hpf — ABNORMAL HIGH (ref 0–5)
pH: 8 (ref 5.0–8.0)

## 2021-08-07 LAB — I-STAT VENOUS BLOOD GAS, ED
Acid-Base Excess: 7 mmol/L — ABNORMAL HIGH (ref 0.0–2.0)
Bicarbonate: 32.5 mmol/L — ABNORMAL HIGH (ref 20.0–28.0)
Calcium, Ion: 1.35 mmol/L (ref 1.15–1.40)
HCT: 29 % — ABNORMAL LOW (ref 36.0–46.0)
Hemoglobin: 9.9 g/dL — ABNORMAL LOW (ref 12.0–15.0)
O2 Saturation: 99 %
Potassium: 4.4 mmol/L (ref 3.5–5.1)
Sodium: 162 mmol/L (ref 135–145)
TCO2: 34 mmol/L — ABNORMAL HIGH (ref 22–32)
pCO2, Ven: 48.3 mmHg (ref 44.0–60.0)
pH, Ven: 7.436 — ABNORMAL HIGH (ref 7.250–7.430)
pO2, Ven: 130 mmHg — ABNORMAL HIGH (ref 32.0–45.0)

## 2021-08-07 LAB — DIFFERENTIAL
Abs Immature Granulocytes: 0.08 10*3/uL — ABNORMAL HIGH (ref 0.00–0.07)
Basophils Absolute: 0.1 10*3/uL (ref 0.0–0.1)
Basophils Relative: 1 %
Eosinophils Absolute: 0.3 10*3/uL (ref 0.0–0.5)
Eosinophils Relative: 2 %
Immature Granulocytes: 1 %
Lymphocytes Relative: 13 %
Lymphs Abs: 2.1 10*3/uL (ref 0.7–4.0)
Monocytes Absolute: 3.2 10*3/uL — ABNORMAL HIGH (ref 0.1–1.0)
Monocytes Relative: 19 %
Neutro Abs: 10.7 10*3/uL — ABNORMAL HIGH (ref 1.7–7.7)
Neutrophils Relative %: 64 %

## 2021-08-07 LAB — COMPREHENSIVE METABOLIC PANEL
ALT: 123 U/L — ABNORMAL HIGH (ref 0–44)
AST: 63 U/L — ABNORMAL HIGH (ref 15–41)
Albumin: 3 g/dL — ABNORMAL LOW (ref 3.5–5.0)
Alkaline Phosphatase: 90 U/L (ref 38–126)
Anion gap: 9 (ref 5–15)
BUN: 112 mg/dL — ABNORMAL HIGH (ref 6–20)
CO2: 30 mmol/L (ref 22–32)
Calcium: 10.5 mg/dL — ABNORMAL HIGH (ref 8.9–10.3)
Chloride: 118 mmol/L — ABNORMAL HIGH (ref 98–111)
Creatinine, Ser: 1.21 mg/dL — ABNORMAL HIGH (ref 0.44–1.00)
GFR, Estimated: 52 mL/min — ABNORMAL LOW (ref >60)
Glucose, Bld: 69 mg/dL — ABNORMAL LOW (ref 70–99)
Potassium: 4.4 mmol/L (ref 3.5–5.1)
Sodium: 157 mmol/L — ABNORMAL HIGH (ref 135–145)
Total Bilirubin: 0.3 mg/dL (ref 0.3–1.2)
Total Protein: 8 g/dL (ref 6.5–8.1)

## 2021-08-07 LAB — RAPID URINE DRUG SCREEN, HOSP PERFORMED
Amphetamines: NOT DETECTED
Barbiturates: NOT DETECTED
Benzodiazepines: NOT DETECTED
Cocaine: NOT DETECTED
Opiates: NOT DETECTED
Tetrahydrocannabinol: NOT DETECTED

## 2021-08-07 LAB — APTT: aPTT: 44 seconds — ABNORMAL HIGH (ref 24–36)

## 2021-08-07 LAB — RESP PANEL BY RT-PCR (FLU A&B, COVID) ARPGX2
Influenza A by PCR: NEGATIVE
Influenza B by PCR: NEGATIVE
SARS Coronavirus 2 by RT PCR: NEGATIVE

## 2021-08-07 LAB — AMMONIA: Ammonia: 26 umol/L (ref 9–35)

## 2021-08-07 LAB — ETHANOL: Alcohol, Ethyl (B): <10 mg/dL (ref <10)

## 2021-08-07 LAB — PROTIME-INR
INR: 1.3 — ABNORMAL HIGH (ref 0.8–1.2)
Prothrombin Time: 16.6 seconds — ABNORMAL HIGH (ref 11.4–15.2)

## 2021-08-07 MED ORDER — SODIUM CHLORIDE 0.9 % IV SOLN
1.0000 g | INTRAVENOUS | Status: DC
  Administered 2021-08-08 – 2021-08-09 (×2): 1 g via INTRAVENOUS
  Filled 2021-08-07: qty 10

## 2021-08-07 MED ORDER — CLOPIDOGREL BISULFATE 75 MG PO TABS
75.0000 mg | ORAL_TABLET | Freq: Every day | ORAL | Status: DC
  Administered 2021-08-08 – 2021-08-09 (×2): 75 mg
  Filled 2021-08-07: qty 1

## 2021-08-07 MED ORDER — BACLOFEN 10 MG PO TABS
10.0000 mg | ORAL_TABLET | Freq: Two times a day (BID) | ORAL | Status: DC
  Administered 2021-08-08 – 2021-08-09 (×5): 10 mg
  Filled 2021-08-07 (×7): qty 1

## 2021-08-07 MED ORDER — SODIUM CHLORIDE 0.9% FLUSH
3.0000 mL | Freq: Two times a day (BID) | INTRAVENOUS | Status: DC
  Administered 2021-08-07 – 2021-08-09 (×5): 3 mL via INTRAVENOUS

## 2021-08-07 MED ORDER — HYDRALAZINE HCL 50 MG PO TABS
50.0000 mg | ORAL_TABLET | Freq: Three times a day (TID) | ORAL | Status: DC
  Administered 2021-08-08 – 2021-08-09 (×5): 50 mg
  Filled 2021-08-07 (×4): qty 1

## 2021-08-07 MED ORDER — ACETAMINOPHEN 650 MG RE SUPP: 650.0000 mg | Freq: Four times a day (QID) | RECTAL | Status: DC | PRN

## 2021-08-07 MED ORDER — ENOXAPARIN SODIUM 40 MG/0.4ML IJ SOSY
40.0000 mg | PREFILLED_SYRINGE | INTRAMUSCULAR | Status: DC
  Administered 2021-08-07 – 2021-08-09 (×3): 40 mg via SUBCUTANEOUS
  Filled 2021-08-07 (×2): qty 0.4

## 2021-08-07 MED ORDER — SODIUM CHLORIDE 0.9 % IV SOLN: 100.0000 mL/h | INTRAVENOUS | Status: DC

## 2021-08-07 MED ORDER — ONDANSETRON HCL 4 MG PO TABS: 4.0000 mg | ORAL_TABLET | Freq: Four times a day (QID) | ORAL | Status: DC | PRN

## 2021-08-07 MED ORDER — ONDANSETRON HCL 4 MG/2ML IJ SOLN: 4.0000 mg | Freq: Four times a day (QID) | INTRAMUSCULAR | Status: DC | PRN

## 2021-08-07 MED ORDER — PHENYTOIN 125 MG/5ML PO SUSP
100.0000 mg | Freq: Three times a day (TID) | ORAL | Status: DC
  Administered 2021-08-08 – 2021-08-09 (×7): 100 mg
  Filled 2021-08-07 (×9): qty 4

## 2021-08-07 MED ORDER — DEXTROSE 5 % IV SOLN: INTRAVENOUS | Status: AC

## 2021-08-07 MED ORDER — PROSOURCE TF PO LIQD
30.0000 mL | Freq: Three times a day (TID) | ORAL | Status: DC
  Administered 2021-08-08 – 2021-08-09 (×6): 30 mL
  Filled 2021-08-07 (×8): qty 45

## 2021-08-07 MED ORDER — AMANTADINE HCL 100 MG PO CAPS
100.0000 mg | ORAL_CAPSULE | Freq: Two times a day (BID) | ORAL | Status: DC
  Administered 2021-08-08 – 2021-08-09 (×4): 100 mg
  Filled 2021-08-07 (×7): qty 1

## 2021-08-07 MED ORDER — SUCRALFATE 1 G PO TABS
1.0000 g | ORAL_TABLET | Freq: Four times a day (QID) | ORAL | Status: DC
  Administered 2021-08-08 – 2021-08-09 (×9): 1 g
  Filled 2021-08-07 (×5): qty 1

## 2021-08-07 MED ORDER — SODIUM CHLORIDE 0.9 % IV SOLN
1.0000 g | Freq: Once | INTRAVENOUS | Status: AC
  Administered 2021-08-07: 20:00:00 1 g via INTRAVENOUS
  Filled 2021-08-07: qty 10

## 2021-08-07 MED ORDER — CARVEDILOL 25 MG PO TABS
25.0000 mg | ORAL_TABLET | Freq: Two times a day (BID) | ORAL | Status: DC
  Administered 2021-08-08 – 2021-08-09 (×4): 25 mg
  Filled 2021-08-07 (×2): qty 1

## 2021-08-07 MED ORDER — PAROXETINE HCL 10 MG PO TABS
10.0000 mg | ORAL_TABLET | Freq: Every day | ORAL | Status: DC
  Administered 2021-08-08 – 2021-08-09 (×2): 10 mg
  Filled 2021-08-07 (×3): qty 1

## 2021-08-07 MED ORDER — ACETAMINOPHEN 325 MG PO TABS: 650.0000 mg | ORAL_TABLET | Freq: Four times a day (QID) | ORAL | Status: DC | PRN

## 2021-08-07 MED ORDER — SODIUM CHLORIDE 0.9 % IV BOLUS
500.0000 mL | Freq: Once | INTRAVENOUS | Status: AC
  Administered 2021-08-07: 15:00:00 500 mL via INTRAVENOUS

## 2021-08-07 MED ORDER — PANTOPRAZOLE 2 MG/ML SUSPENSION
40.0000 mg | Freq: Every day | ORAL | Status: DC
  Filled 2021-08-07 (×2): qty 20

## 2021-08-07 MED ORDER — LEVETIRACETAM 100 MG/ML PO SOLN
1500.0000 mg | Freq: Two times a day (BID) | ORAL | Status: DC
  Filled 2021-08-07 (×3): qty 15

## 2021-08-07 MED ORDER — SODIUM CHLORIDE 0.45 % IV SOLN: INTRAVENOUS | Status: DC

## 2021-08-07 MED ORDER — AMLODIPINE BESYLATE 10 MG PO TABS
10.0000 mg | ORAL_TABLET | Freq: Every day | ORAL | Status: DC
  Administered 2021-08-08 – 2021-08-09 (×2): 10 mg
  Filled 2021-08-07: qty 1

## 2021-08-07 NOTE — H&P (Signed)
History and Physical    [name] KTG:[id number] DOB: 1961-03-26 DOA: 08/07/2021  PCP: Pcp, No  Patient coming from: [place] and rehab via EMS  I have personally briefly reviewed patient's old medical records in [place]  Chief Complaint: Altered mental status  HPI: [name] is a 60 y.o. 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  Patient is unable to provide any history which is otherwise obtained by EDP and chart review.  Patient is minimally verbal and bedbound at baseline after prior stroke.  She reportedly is able to communicate by nodding her head yes or no or by fragmented speech.  Today at her facility she was noted to be unable to communicate in her usual manner, not responding to name or painful stimuli.  She was sent to the ED for further evaluation.  ED Course:  Initial vitals showed BP 170/107, pulse 84, RR 18, temp 98.3 F, SPO2 100% on room air.  Labs show sodium 157, potassium 4.4, chloride 118, bicarb 30, BUN 112, creatinine 1.21 (previously 0.9 in July 2022), serum glucose 69, calcium 10.5 (11.3 when corrected for hypoalbuminemia), albumin 3.0, AST 63, ALT 123, alk phos 90, total bilirubin 0.3, WBC 16.4, hemoglobin 9.7, platelets 166,000.  Ammonia 26, UDS negative, serum ethanol undetectable.  Urinalysis shows negative nitrites, large leukocytes, 11-20 RBC/hpf, >50 WBC/hpf, many bacteria microscopy.  COVID and influenza PCR negative.  Portable chest x-ray negative for focal consolidation, edema, effusion.  CT head without contrast negative for acute CT finding.  Atrophy and chronic small vessel ischemic changes noted.  Old right  anterior cerebral artery territory stroke shows progressive volume loss with some dystrophic calcification.  Patient was given 500 cc normal saline followed by 0.45 saline at 150 mL/hour.  Patient received 1 g IV ceftriaxone.  The hospitalist service was consulted to admit for further evaluation and management.  Review of Systems:  Unable to obtain full review of systems due to severe dysarthria.    Past Medical History:  Diagnosis Date   Arthritis    CKD (chronic kidney disease) stage 3, GFR 30-59 ml/min (HCC)    CVA (cerebral vascular accident) ([place])    Hypertension    Hypertension    not taking medications    Past Surgical History:  Procedure Laterality Date   BUBBLE STUDY  06/15/2020   Procedure: BUBBLE STUDY;  Surgeon: Werner Lean, MD;  Location: [facility];  Service: Cardiovascular;;   CESAREAN SECTION     TEE WITHOUT CARDIOVERSION N/A 06/15/2020   Procedure: TRANSESOPHAGEAL ECHOCARDIOGRAM (TEE);  Surgeon: Werner Lean, MD;  Location: [facility];  Service: Cardiovascular;  Laterality: N/A;    Social History:  reports that she has never smoked. She has never used smokeless tobacco. She reports that she does not currently use alcohol. She reports that she does not use drugs.  No Known Allergies  Family History  Problem Relation Age of Onset   Cancer Mother    Hypertension Father    Heart disease Father    Heart disease Brother    Stroke Neg Hx      Prior to Admission medications   Medication Sig Start Date End Date Taking? Authorizing Provider  acetaminophen (TYLENOL) 325 MG tablet Take 2 tablets (  650 mg total) by mouth every 4 (four) hours as needed for mild pain (or temp > 37.5 C (99.5 F)). 05/31/20  Yes Angiulli, Lavon Paganini, PA-C  amantadine (SYMMETREL) 50 MG/5ML solution Take 10 mLs (100 mg total) by mouth 2 (two) times daily with breakfast and lunch. Patient taking differently: Place 100 mg into feeding tube 2 (two) times daily with  breakfast and lunch. 05/31/20  Yes Angiulli, Lavon Paganini, PA-C  Amino Acids-Protein Hydrolys (FEEDING SUPPLEMENT, PRO-STAT 64,) LIQD Place 30 mLs into feeding tube 3 (three) times daily with meals.   Yes [provider]  amLODipine (NORVASC) 10 MG tablet Take 1 tablet (10 mg total) by mouth daily. Patient taking differently: Place 10 mg into feeding tube daily. 05/31/20  Yes Angiulli, Lavon Paganini, PA-C  ascorbic acid (VITAMIN C) 500 MG tablet Place 500 mg into feeding tube in the morning and at bedtime.   Yes [provider]  baclofen (LIORESAL) 10 MG tablet Take 1 tablet (10 mg total) by mouth 2 (two) times daily. 05/31/20  Yes Angiulli, Lavon Paganini, PA-C  carvedilol (COREG) 25 MG tablet Place 25 mg into feeding tube in the morning and at bedtime. 12/13/20  Yes [provider]  clopidogrel (PLAVIX) 75 MG tablet Take 1 tablet (75 mg total) by mouth daily. Patient taking differently: Place 75 mg into feeding tube daily. 06/19/20  Yes Sheikh, Omair Latif, DO  ferrous sulfate 325 (65 FE) MG tablet Take 1 tablet (325 mg total) by mouth 2 (two) times daily with a meal. Patient taking differently: 325 mg 2 (two) times daily with a meal. Per tube 05/31/20  Yes Angiulli, Lavon Paganini, PA-C  gabapentin (NEURONTIN) 100 MG capsule 100 mg in the morning and at bedtime. Per tube   Yes [provider]  hydrALAZINE (APRESOLINE) 100 MG tablet Take 1 tablet (100 mg total) by mouth every 8 (eight) hours. Patient taking differently: Place 50 mg into feeding tube 3 (three) times daily. 05/31/20  Yes Angiulli, Lavon Paganini, PA-C  isosorbide mononitrate (IMDUR) 60 MG 24 hr tablet Take 1 tablet (60 mg total) by mouth daily. Patient taking differently: 60 mg in the morning and at bedtime. Per tube 06/02/20  Yes Angiulli, Lavon Paganini, PA-C  levETIRAcetam (KEPPRA) 100 MG/ML solution Place 15 mLs into feeding tube in the morning and at bedtime.   Yes [provider]  liver oil-zinc oxide (DESITIN) 40 %  ointment Apply 1 application topically 3 (three) times daily. Apply to bilateral gluteal folds topically every shift for excoriation   Yes [provider]  Multiple Vitamin (MULTIVITAMIN WITH MINERALS) TABS tablet Take 1 tablet by mouth daily. Patient taking differently: Place 1 tablet into feeding tube daily. 05/04/20  Yes Mariel Aloe, MD  ondansetron (ZOFRAN) 4 MG tablet Place 4 mg into feeding tube every 6 (six) hours as needed.   Yes [provider]  pantoprazole sodium (PROTONIX) 40 mg Place 40 mg into feeding tube daily.   Yes [provider]  PARoxetine (PAXIL) 10 MG tablet Place 10 mg into feeding tube daily.   Yes [provider]  Petrolatum-Zinc Oxide (PHYTOPLEX Z-GUARD) 57-17 % PSTE Apply 1 application topically in the morning, at noon, and at bedtime. Apply to bilateral buttocks topically every shift for moisture barrier cream   Yes [provider]  phenytoin (DILANTIN) 125 MG/5ML suspension Place 4 mLs into feeding tube 3 (three) times daily.   Yes [provider]  polyethylene glycol (MIRALAX / GLYCOLAX) 17 g  packet Take 17 g by mouth 2 (two) times daily. 05/31/20  Yes Angiulli, Lavon Paganini, PA-C  sucralfate (CARAFATE) 1 g tablet Take 1 tablet (1 g total) by mouth 4 (four) times daily. Mix 1 tablet with 10-54ms of water to make a slurry and take by mouth four times daily with meals and at bedtime Patient taking differently: Place 1 g into feeding tube 4 (four) times daily. Mix 1 tablet with 10-135m of water to make a slurry and take by mouth four times daily with meals and at bedtime 05/31/20 08/07/21 Yes Noriko Macari, AnDomenick BookbinderMD  traMADol (ULTRAM) 50 MG tablet Take 1 tablet (50 mg total) by mouth every 6 (six) hours as needed for moderate pain. 06/18/20  Yes Sheikh, Omair Latif, DO  aspirin EC 325 MG EC tablet Take 1 tablet (325 mg total) by mouth daily. Patient not taking: Reported on 08/07/2021 06/19/20   ShRaiford Nobleatif, DO   atorvastatin (LIPITOR) 40 MG tablet Take 1 tablet (40 mg total) by mouth daily. Patient not taking: Reported on 08/07/2021 05/31/20   Angiulli, DaLavon PaganiniPA-C  clonazePAM (KLONOPIN) 0.5 MG tablet Take 0.5 tablets (0.25 mg total) by mouth 2 (two) times daily as needed for anxiety. 06/18/20 06/18/21  ShRaiford Nobleatif, DO  diclofenac Sodium (VOLTAREN) 1 % GEL Apply 2 g topically 4 (four) times daily. Patient not taking: Reported on 08/07/2021 05/31/20   Angiulli, DaLavon PaganiniPA-C  FLUoxetine (PROZAC) 10 MG capsule Take 1 capsule (10 mg total) by mouth daily. Patient not taking: Reported on 08/07/2021 05/31/20   Angiulli, DaLavon PaganiniPA-C  melatonin 3 MG TABS tablet Take 1 tablet (3 mg total) by mouth at bedtime. 05/31/20   Angiulli, DaLavon PaganiniPA-C  metoprolol tartrate 75 MG TABS Take 75 mg by mouth 2 (two) times daily. Patient not taking: Reported on 08/07/2021 06/18/20   ShRaiford Nobleatif, DO  omeprazole (PRILOSEC) 40 MG capsule Take 1 capsule (40 mg total) by mouth 2 (two) times daily for 60 doses. Open up 1 capsule & sprinkle on food twice daily 05/31/20 06/30/20  PaJamse ArnMD    Physical Exam: Vitals:   08/07/21 1915 08/07/21 1930 08/07/21 1945 08/07/21 2000  BP: (!) 157/97 (!) 160/94 (!) 169/94 (!) 168/91  Pulse: 82 80 80 81  Resp: 17 12 16  (!) 23  Temp:      TempSrc:      SpO2: 100% 99% 100% 100%  Weight:      Height:       Constitutional: Chronically ill-appearing woman resting supine in bed.   Eyes: PERRL, tracks with eyes. ENMT: Mucous membranes are dry.  Poor dentition.  Neck: normal, supple, no masses. Respiratory: clear to auscultation anteriorly normal respiratory effort. No accessory muscle use.  Cardiovascular: Regular rate and rhythm, no murmurs / rubs / gallops. No extremity edema. 2+ pedal pulses. Abdomen: PEG tube in place, no obvious tenderness, no masses palpated. No hepatosplenomegaly. Bowel sounds positive.  Musculoskeletal: Paraplegic with LUE contracture, not  moving extremities. Skin: no rashes, lesions, ulcers. No induration Neurologic: Severely dysarthric unintelligible speech, paraplegic -not moving extremities. Psychiatric: Awake but unable to communicate verbally or by head nod.  Labs on Admission: I have personally reviewed following labs and imaging studies  CBC: Recent Labs  Lab 08/07/21 1438 08/07/21 1448 08/07/21 1449  WBC 16.4*  --   --   NEUTROABS 10.7*  --   --   HGB 9.7* 9.5* 9.9*  HCT 31.9* 28.0* 29.0*  MCV 94.7  --   --   PLT 166  --   --    Basic Metabolic Panel: Recent Labs  Lab 08/07/21 1438 08/07/21 1448 08/07/21 1449  NA 157* 162* 162*  K 4.4 4.3 4.4  CL 118* 123*  --   CO2 30  --   --   GLUCOSE 69* 69*  --   BUN 112* 122*  --   CREATININE 1.21* 1.40*  --   CALCIUM 10.5*  --   --    GFR: Estimated Creatinine Clearance: 39.5 mL/min (A) (by C-G formula based on SCr of 1.4 mg/dL (H)). Liver Function Tests: Recent Labs  Lab 08/07/21 1438  AST 63*  ALT 123*  ALKPHOS 90  BILITOT 0.3  PROT 8.0  ALBUMIN 3.0*   No results for input(s): LIPASE, AMYLASE in the last 168 hours. Recent Labs  Lab 08/07/21 1439  AMMONIA 26   Coagulation Profile: Recent Labs  Lab 08/07/21 1438  INR 1.3*   Cardiac Enzymes: No results for input(s): CKTOTAL, CKMB, CKMBINDEX, TROPONINI in the last 168 hours. BNP (last 3 results) No results for input(s): PROBNP in the last 8760 hours. HbA1C: No results for input(s): HGBA1C in the last 72 hours. CBG: No results for input(s): GLUCAP in the last 168 hours. Lipid Profile: No results for input(s): CHOL, HDL, LDLCALC, TRIG, CHOLHDL, LDLDIRECT in the last 72 hours. Thyroid Function Tests: No results for input(s): TSH, T4TOTAL, FREET4, T3FREE, THYROIDAB in the last 72 hours. Anemia Panel: No results for input(s): VITAMINB12, FOLATE, FERRITIN, TIBC, IRON, RETICCTPCT in the last 72 hours. Urine analysis:    Component Value Date/Time   COLORURINE AMBER (A) 08/07/2021 1438    APPEARANCEUR CLOUDY (A) 08/07/2021 1438   LABSPEC 1.015 08/07/2021 1438   PHURINE 8.0 08/07/2021 1438   GLUCOSEU NEGATIVE 08/07/2021 1438   HGBUR MODERATE (A) 08/07/2021 1438   BILIRUBINUR NEGATIVE 08/07/2021 1438   KETONESUR NEGATIVE 08/07/2021 1438   PROTEINUR 100 (A) 08/07/2021 1438   UROBILINOGEN 0.2 10/09/2010 1518   NITRITE NEGATIVE 08/07/2021 1438   LEUKOCYTESUR LARGE (A) 08/07/2021 1438    Radiological Exams on Admission: CT HEAD WO CONTRAST  Result Date: 08/07/2021 CLINICAL DATA:  Neuro deficit, acute, stroke suspected. EXAM: CT HEAD WITHOUT CONTRAST TECHNIQUE: Contiguous axial images were obtained from the base of the skull through the vertex without intravenous contrast. COMPARISON:  06/13/2020 FINDINGS: Brain: There is generalized brain atrophy. Chronic small-vessel ischemic changes affect the pons. There are old small vessel infarctions within the cerebellum. Cerebral hemispheres show chronic small-vessel ischemic changes throughout the white matter as well as old infarction in the right anterior cerebral artery territory affecting the cingulate gyrus. This was acute 1 year ago. There is some dystrophic calcification within the region of old infarction. No sign of acute infarction, mass lesion, obstructive hydrocephalus or extra-axial collection. Vascular: There is atherosclerotic calcification of the major vessels at the base of the brain. Skull: Negative Sinuses/Orbits: Clear/normal Other: None IMPRESSION: No acute CT finding. Atrophy and chronic small-vessel ischemic changes. Old right anterior cerebral artery territory stroke which was acute 1 year ago and shows progressive volume loss with some dystrophic calcification. Electronically Signed   By: Nelson Chimes M.D.   On: 08/07/2021 16:41   DG Chest Portable 1 View  Result Date: 08/07/2021 CLINICAL DATA:  Altered mental status, history of stroke, hypertension EXAM: PORTABLE CHEST 1 VIEW COMPARISON:  Portable exam 1505 hours  compared to 06/14/2020 FINDINGS: Normal heart size, mediastinal contours, and pulmonary vascularity.  Atherosclerotic calcification at aortic arch. Lungs clear. No pulmonary infiltrate, pleural effusion, or pneumothorax. No acute osseous findings. IMPRESSION: No acute abnormalities. Aortic Atherosclerosis (ICD10-I70.0). Electronically Signed   By: Lavonia Dana M.D.   On: 08/07/2021 15:12    EKG: Personally reviewed. Sinus rhythm, LVH, T wave inversion in lead III.  T wave changes more pronounced when compared to prior.  Assessment/Plan Principal Problem:   Hypernatremia Active Problems:   HTN (hypertension)   Anemia, iron deficiency   AKI (acute kidney injury) (HCC)   Dysphagia, post-stroke   History of CVA with residual deficit   Seizure disorder (HCC)   UTI (urinary tract infection) due to urinary indwelling Foley catheter ([place])   Hypercalcemia   [name] is a 60 y.o. female with medical history significant for CVA with residual paraplegia, bedbound with contractures, severe dysarthria, dysphagia who is PEG tube dependent, neurogenic bladder with chronic indwelling Foley catheter, seizure disorder, hypertension, hyperlipidemia, anemia, who is admitted with hypernatremia.  Hypernatremia: Secondary to severe dehydration.  Continue IV D5W at 100 mL/hour overnight.  Repeat labs in AM.  UTI: Concern for UTI leading to presentation.  Started on empiric IV ceftriaxone.  Follow urine culture.  Hypercalcemia: Likely secondary to dehydration.  Continue IV fluids as above and monitor.  Acute kidney injury: Creatinine 1.21 on admission, previously 0.9 in July 2022.  Secondary to hypovolemia.  Continue IV fluids and repeat labs in AM.  History of CVA (residual spastic paraplegia, bedbound w/ contractures, severe dysarthria): Somewhat worsening level of interaction on admission.  Continue Plavix, baclofen.  Dysphagia/PEG tube dependent: Continue PEG tube care.  Neurogenic  bladder with chronic indwelling Foley: Continue Foley care.  Seizure disorder: Continue Keppra and phenytoin.  Hypertension: Continue amlodipine, Coreg, hydralazine.  Anemia: Relatively stable, continue to monitor.  Hyperlipidemia: Med rec states that patient no longer taking statin.  Depression/anxiety: Continue Paxil.  DVT prophylaxis: Lovenox Code Status: DNR Family Communication: None present on admission. Disposition Plan: From [facility] and rehab, likely return to same facility pending clinical progress. Consults called: None Level of care: Telemetry Medical Admission status:  Status is: Inpatient  Remains inpatient appropriate because: Continued management and monitoring of severe hypernatremia, AKI, hypercalcemia with IV fluid hydration, daily labs.   Zada Finders MD [facility]  If 7PM-7AM, please contact night-coverage [email or web address]  08/07/2021, 8:54 PM

## 2021-08-07 NOTE — ED Triage Notes (Signed)
Pt arrives via EMS from [place] for [facility]. Pt has a hx of CVA in is nonverbal at baseline but normally responds to name and shakes head for yes no. Nurse at facility reports that pt presented this way at the beginning of her shift (0700). States she went and checked on her this evening and she presented the same. Unknown LKW. Pt does not respond to name or painful stimuli at this time.

## 2021-08-07 NOTE — ED Provider Notes (Signed)
[facility] EMERGENCY DEPARTMENT Provider Note   CSN: [id number] Arrival date & time: 08/07/21  1436     History Chief Complaint  Patient presents with   Altered Mental Status    Graclyn Lawther is a 60 y.o. female.   Altered Mental Status  Patient presented to the ER for evaluation of altered mental status.  Patient is a resident of [facility] in the setting of a prior stroke.  Patient at baseline is nonverbal but usually is able to shake her head and answer yes and no.  Patient's last time normal was unknown.  The nurse that evaluated the patient this morning found her this way at 0700.  They contacted family members and when she did not improve she was sent to the ED for further eval .  Patient is unable to speak to me and provide any history.  Past Medical History:  Diagnosis Date   Arthritis    CKD (chronic kidney disease) stage 3, GFR 30-59 ml/min (HCC)    CVA (cerebral vascular accident) ([place])    Hypertension    Hypertension    not taking medications    Patient Active Problem List   Diagnosis Date Noted   Goals of care, counseling/discussion    Palliative care by specialist    CKD (chronic kidney disease) stage 3, GFR 30-59 ml/min (HCC)    Labile blood pressure    Slow transit constipation    Acute blood loss anemia    Left inguinal pain    Pain    Spastic hemiparesis (HCC)    Dysphagia, post-stroke    Pressure injury of skin 05/04/2020   Thrombocytosis    Leukocytosis    AKI (acute kidney injury) ([place])    Benign essential HTN    Lethargy    CVA (cerebral vascular accident) ([place]) 05/03/2020   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   Hypertensive emergency    Slurred speech    Essential hypertension    Acute combined systolic (congestive) and diastolic (congestive) heart failure (HCC)    Thrombocytopenia (HCC)    Stage 3b chronic kidney disease ([place])     Prediabetes    Acute CVA (cerebrovascular accident) ([place]) 04/27/2020   Accelerated hypertension 04/27/2020   Renal dysfunction 04/27/2020   Hypokalemia 04/27/2020   HTN (hypertension) 10/26/2015   Anemia, iron deficiency 10/26/2015   Visit for preventive health examination 10/26/2015   Osteoarthritis of both knees     Past Surgical History:  Procedure Laterality Date   BUBBLE STUDY  06/15/2020   Procedure: BUBBLE STUDY;  Surgeon: Werner Lean, MD;  Location: [place];  Service: Cardiovascular;;   CESAREAN SECTION     TEE WITHOUT CARDIOVERSION N/A 06/15/2020   Procedure: TRANSESOPHAGEAL ECHOCARDIOGRAM (TEE);  Surgeon: Werner Lean, MD;  Location: [facility] ENDOSCOPY;  Service: Cardiovascular;  Laterality: N/A;     OB History   No obstetric history on file.     Family History  Problem Relation Age of Onset   Cancer Mother    Hypertension Father    Heart disease Father    Heart disease Brother    Stroke Neg Hx     Social History   Tobacco Use   Smoking status: Never   Smokeless tobacco: Never  Vaping Use   Vaping Use: Never used  Substance Use Topics   Alcohol use: Not Currently   Drug use: Never    Home Medications Prior  to Admission medications   Medication Sig Start Date End Date Taking? Authorizing Provider  amLODipine (NORVASC) 10 MG tablet Take 1 tablet (10 mg total) by mouth daily. 05/31/20  Yes Angiulli, Lavon Paganini, PA-C  clopidogrel (PLAVIX) 75 MG tablet Take 1 tablet (75 mg total) by mouth daily. 06/19/20  Yes Sheikh, Omair Latif, DO  ferrous sulfate 325 (65 FE) MG tablet Take 1 tablet (325 mg total) by mouth 2 (two) times daily with a meal. 05/31/20  Yes Angiulli, Lavon Paganini, PA-C  Multiple Vitamin (MULTIVITAMIN WITH MINERALS) TABS tablet Take 1 tablet by mouth daily. 05/04/20  Yes Mariel Aloe, MD  Nutritional Supplements (ISOSOURCE 1.5 CAL PO) Take 70 mLs by mouth as directed. Infuse for 18 hours at 14mL/hr. Flush with 22mL of water q4h    Yes [provider]  Pantoprazole Sodium POWD Take 40 mg by mouth daily. Give 1 packet via G-tube one time a day for GERD   Yes [provider]  acetaminophen (TYLENOL) 325 MG tablet Take 2 tablets (650 mg total) by mouth every 4 (four) hours as needed for mild pain (or temp > 37.5 C (99.5 F)). 05/31/20   Angiulli, Lavon Paganini, PA-C  amantadine (SYMMETREL) 50 MG/5ML solution Take 10 mLs (100 mg total) by mouth 2 (two) times daily with breakfast and lunch. 05/31/20   Angiulli, Lavon Paganini, PA-C  aspirin EC 325 MG EC tablet Take 1 tablet (325 mg total) by mouth daily. 06/19/20   Sheikh, Omair Latif, DO  atorvastatin (LIPITOR) 40 MG tablet Take 1 tablet (40 mg total) by mouth daily. 05/31/20   Angiulli, Lavon Paganini, PA-C  baclofen (LIORESAL) 10 MG tablet Take 1 tablet (10 mg total) by mouth 2 (two) times daily. 05/31/20   Angiulli, Lavon Paganini, PA-C  clonazePAM (KLONOPIN) 0.5 MG tablet Take 0.5 tablets (0.25 mg total) by mouth 2 (two) times daily as needed for anxiety. 06/18/20 06/18/21  Raiford Noble Latif, DO  diclofenac Sodium (VOLTAREN) 1 % GEL Apply 2 g topically 4 (four) times daily. 05/31/20   Angiulli, Lavon Paganini, PA-C  FLUoxetine (PROZAC) 10 MG capsule Take 1 capsule (10 mg total) by mouth daily. 05/31/20   Angiulli, Lavon Paganini, PA-C  hydrALAZINE (APRESOLINE) 100 MG tablet Take 1 tablet (100 mg total) by mouth every 8 (eight) hours. 05/31/20   Angiulli, Lavon Paganini, PA-C  isosorbide mononitrate (IMDUR) 60 MG 24 hr tablet Take 1 tablet (60 mg total) by mouth daily. 06/02/20   Angiulli, Lavon Paganini, PA-C  melatonin 3 MG TABS tablet Take 1 tablet (3 mg total) by mouth at bedtime. 05/31/20   Angiulli, Lavon Paganini, PA-C  metoprolol tartrate 75 MG TABS Take 75 mg by mouth 2 (two) times daily. 06/18/20   Sheikh, Omair Latif, DO  omeprazole (PRILOSEC) 40 MG capsule Take 1 capsule (40 mg total) by mouth 2 (two) times daily for 60 doses. Open up 1 capsule & sprinkle on food twice daily 05/31/20 06/30/20  Jamse Arn,  MD  polyethylene glycol (MIRALAX / GLYCOLAX) 17 g packet Take 17 g by mouth 2 (two) times daily. Patient not taking: Reported on 06/08/2020 05/31/20   Angiulli, Lavon Paganini, PA-C  sucralfate (CARAFATE) 1 g tablet Take 1 tablet (1 g total) by mouth 4 (four) times daily. Mix 1 tablet with 10-81mls of water to make a slurry and take by mouth four times daily with meals and at bedtime 05/31/20 05/31/21  Jamse Arn, MD  traMADol (ULTRAM) 50 MG tablet Take 1 tablet (  50 mg total) by mouth every 6 (six) hours as needed for moderate pain. 06/18/20   Kerney Elbe, DO    Allergies    Patient has no known allergies.  Review of Systems   Review of Systems  Unable to perform ROS: Mental status change   Physical Exam Updated Vital Signs BP (!) 178/97   Pulse 80   Temp 98.3 F (36.8 C) (Rectal)   Resp 17   Ht 1.6 m (5\' 3" )   Wt 65.8 kg   LMP 09/15/2013   SpO2 98%   BMI 25.70 kg/m   Physical Exam Vitals and nursing note reviewed.  Constitutional:      Appearance: She is well-developed. She is ill-appearing.  HENT:     Head: Normocephalic and atraumatic.     Right Ear: External ear normal.     Left Ear: External ear normal.     Mouth/Throat:     Mouth: Mucous membranes are dry.  Eyes:     General: No scleral icterus.       Right eye: No discharge.        Left eye: No discharge.     Conjunctiva/sclera: Conjunctivae normal.  Neck:     Trachea: No tracheal deviation.  Cardiovascular:     Rate and Rhythm: Normal rate and regular rhythm.  Pulmonary:     Effort: Pulmonary effort is normal. No respiratory distress.     Breath sounds: Normal breath sounds. No stridor. No wheezing or rales.  Abdominal:     General: Bowel sounds are normal. There is no distension.     Palpations: Abdomen is soft.     Tenderness: There is no abdominal tenderness. There is no guarding or rebound.  Musculoskeletal:        General: No tenderness or deformity.     Cervical back: Neck supple.  Skin:     General: Skin is warm and dry.     Findings: No rash.  Neurological:     Cranial Nerves: Cranial nerve deficit (Nonverbal, not responding) present.     Motor: No seizure activity.     Comments: Patient unresponsive, right movement of the right upper extremity but otherwise no movement  Psychiatric:        Mood and Affect: Mood normal.    ED Results / Procedures / Treatments   Labs (all labs ordered are listed, but only abnormal results are displayed) Labs Reviewed  PROTIME-INR - Abnormal; Notable for the following components:      Result Value   Prothrombin Time 16.6 (*)    INR 1.3 (*)    All other components within normal limits  APTT - Abnormal; Notable for the following components:   aPTT 44 (*)    All other components within normal limits  CBC - Abnormal; Notable for the following components:   WBC 16.4 (*)    RBC 3.37 (*)    Hemoglobin 9.7 (*)    HCT 31.9 (*)    RDW 17.0 (*)    All other components within normal limits  I-STAT CHEM 8, ED - Abnormal; Notable for the following components:   Sodium 162 (*)    Chloride 123 (*)    BUN 122 (*)    Creatinine, Ser 1.40 (*)    Glucose, Bld 69 (*)    Hemoglobin 9.5 (*)    HCT 28.0 (*)    All other components within normal limits  I-STAT VENOUS BLOOD GAS, ED - Abnormal; Notable for the following  components:   pH, Ven 7.436 (*)    pO2, Ven 130.0 (*)    Bicarbonate 32.5 (*)    TCO2 34 (*)    Acid-Base Excess 7.0 (*)    Sodium 162 (*)    HCT 29.0 (*)    Hemoglobin 9.9 (*)    All other components within normal limits  RESP PANEL BY RT-PCR (FLU A&B, COVID) ARPGX2  ETHANOL  DIFFERENTIAL  COMPREHENSIVE METABOLIC PANEL  RAPID URINE DRUG SCREEN, HOSP PERFORMED  URINALYSIS, ROUTINE W REFLEX MICROSCOPIC  AMMONIA    EKG None  Radiology DG Chest Portable 1 View  Result Date: 08/07/2021 CLINICAL DATA:  Altered mental status, history of stroke, hypertension EXAM: PORTABLE CHEST 1 VIEW COMPARISON:  Portable exam 1505 hours  compared to 06/14/2020 FINDINGS: Normal heart size, mediastinal contours, and pulmonary vascularity. Atherosclerotic calcification at aortic arch. Lungs clear. No pulmonary infiltrate, pleural effusion, or pneumothorax. No acute osseous findings. IMPRESSION: No acute abnormalities. Aortic Atherosclerosis (ICD10-I70.0). Electronically Signed   By: Lavonia Dana M.D.   On: 08/07/2021 15:12    Procedures Procedures   Medications Ordered in ED Medications  sodium chloride 0.9 % bolus 500 mL (500 mLs Intravenous New Bag/Given 08/07/21 1502)    Followed by  0.9 %  sodium chloride infusion (100 mL/hr Intravenous New Bag/Given 08/07/21 1502)  0.45 % sodium chloride infusion (has no administration in time range)    ED Course  I have reviewed the triage vital signs and the nursing notes.  Pertinent labs & imaging results that were available during my care of the patient were reviewed by me and considered in my medical decision making (see chart for details).  Clinical Course as of 08/07/21 1524  Mon Aug 07, 2021  1512 Sodium level increased on the i-STAT at 162.   [JK]    Clinical Course User Index [JK] Dorie Rank, MD   MDM Rules/Calculators/A&P                           Patient presents to the ED for ration of altered mental status.  Has baseline history of prior stroke.  Initial vital's are stable but concerned about the possibility of recurrent stroke cerebral hemorrhage electrolyte abnormalities.  Labs are pending however initial i-STAT shows evidence of hyperchloremia and hypernatremia.  Patient is feeding tube dependent.  Suspect dehydration and free water deficit.  We will proceed with hypotonic saline hydration.  Anticipate admission.  Care turned over to  oncoming MD at shift change Final Clinical Impression(s) / ED Diagnoses Final diagnoses:  Hypernatremia  Altered mental status, unspecified altered mental status type     Dorie Rank, MD 08/08/21 1538

## 2021-08-07 NOTE — ED Provider Notes (Signed)
Patient signed out to me by previous provider. Please refer to their note for full HPI.  Patient is DNR.  Briefly this is a 60 year old female status post CVA who is minimally verbal at baseline presents emergency department with reported altered mental status from her facility.  Facility staff that the patient seemed to baseline at 7 AM at the beginning of the shift but when she was rechecked she did not seem to respond to name or stimuli at baseline.  I-STAT on arrival shows hypernatremia with a sodium over 160.  We are pending remainder blood evaluation and CT of the head. Physical Exam  BP (!) 170/93   Pulse 81   Temp 98.3 F (36.8 C) (Rectal)   Resp 12   Ht 5\' 3"  (1.6 m)   Wt 65.8 kg   LMP 09/15/2013   SpO2 99%   BMI 25.70 kg/m   Physical Exam Vitals and nursing note reviewed.  Constitutional:      Comments: Opens eyes to name, responds to sternal rub  HENT:     Head: Normocephalic.     Mouth/Throat:     Mouth: Mucous membranes are moist.  Eyes:     Pupils: Pupils are equal, round, and reactive to light.  Cardiovascular:     Rate and Rhythm: Normal rate.  Pulmonary:     Effort: Pulmonary effort is normal. No respiratory distress.  Abdominal:     Palpations: Abdomen is soft.     Tenderness: There is no abdominal tenderness.  Skin:    General: Skin is warm.  Neurological:     Comments: Responds to painful stimuli    ED Course/Procedures   Clinical Course as of 08/07/21 1831  Mon Aug 07, 2021  1512 Sodium level increased on the i-STAT at 162.   [JK]    Clinical Course User Index [JK] Dorie Rank, MD    Procedures  MDM   Level 5 caveat due to patient's nonverbal status.  Daughter was briefly at bedside.  States that she usually can shake her head yes or no and in fragmented speech can say hi and her name.  This is a change from the patient's baseline.  Blood work confirms hypernatremia of 157, BUN is uptrending with mild AKI, flu and COVID is negative, urinalysis  has large amount of bacteria with leukocytes and white blood cells greater than 50, concerning for possible UTI.  Head CT shows no acute change.  Patient is not significantly acidotic.  CBC has a mild leukocytosis but otherwise baseline.  Plan to admit for altered mental status, most likely secondary to hyponatremia with potential complicating UTI.  Patients evaluation and results requires admission for further treatment and care. Patient agrees with admission plan, offers no new complaints and is stable/unchanged at time of admit.       Lorelle Gibbs, DO 08/07/21 6333

## 2021-08-08 LAB — CBC
HCT: 30.1 % — ABNORMAL LOW (ref 36.0–46.0)
Hemoglobin: 9 g/dL — ABNORMAL LOW (ref 12.0–15.0)
MCH: 28.5 pg (ref 26.0–34.0)
MCHC: 29.9 g/dL — ABNORMAL LOW (ref 30.0–36.0)
MCV: 95.3 fL (ref 80.0–100.0)
Platelets: 141 10*3/uL — ABNORMAL LOW (ref 150–400)
RBC: 3.16 MIL/uL — ABNORMAL LOW (ref 3.87–5.11)
RDW: 16.8 % — ABNORMAL HIGH (ref 11.5–15.5)
WBC: 13.5 10*3/uL — ABNORMAL HIGH (ref 4.0–10.5)
nRBC: 0 % (ref 0.0–0.2)

## 2021-08-08 LAB — COMPREHENSIVE METABOLIC PANEL
ALT: 122 U/L — ABNORMAL HIGH (ref 0–44)
AST: 45 U/L — ABNORMAL HIGH (ref 15–41)
Albumin: 2.7 g/dL — ABNORMAL LOW (ref 3.5–5.0)
Alkaline Phosphatase: 83 U/L (ref 38–126)
Anion gap: 11 (ref 5–15)
BUN: 88 mg/dL — ABNORMAL HIGH (ref 6–20)
CO2: 26 mmol/L (ref 22–32)
Calcium: 9.9 mg/dL (ref 8.9–10.3)
Chloride: 117 mmol/L — ABNORMAL HIGH (ref 98–111)
Creatinine, Ser: 1.01 mg/dL — ABNORMAL HIGH (ref 0.44–1.00)
GFR, Estimated: >60 mL/min (ref >60)
Glucose, Bld: 165 mg/dL — ABNORMAL HIGH (ref 70–99)
Potassium: 3.7 mmol/L (ref 3.5–5.1)
Sodium: 154 mmol/L — ABNORMAL HIGH (ref 135–145)
Total Bilirubin: 0.2 mg/dL — ABNORMAL LOW (ref 0.3–1.2)
Total Protein: 7.2 g/dL (ref 6.5–8.1)

## 2021-08-08 LAB — SODIUM
Sodium: 155 mmol/L — ABNORMAL HIGH (ref 135–145)
Sodium: 153 mmol/L — ABNORMAL HIGH (ref 135–145)
Sodium: 149 mmol/L — ABNORMAL HIGH (ref 135–145)
Sodium: 146 mmol/L — ABNORMAL HIGH (ref 135–145)
Sodium: 144 mmol/L (ref 135–145)
Sodium: 141 mmol/L (ref 135–145)

## 2021-08-08 LAB — HIV ANTIBODY (ROUTINE TESTING W REFLEX): HIV Screen 4th Generation wRfx: NONREACTIVE

## 2021-08-08 MED ORDER — DEXTROSE 5 % IV SOLN: INTRAVENOUS | Status: DC

## 2021-08-08 MED ORDER — SUCRALFATE 1 G PO TABS
ORAL_TABLET | ORAL | Status: AC
  Filled 2021-08-08: qty 1

## 2021-08-08 MED ORDER — HYDRALAZINE HCL 20 MG/ML IJ SOLN: 10.0000 mg | Freq: Four times a day (QID) | INTRAMUSCULAR | Status: DC | PRN

## 2021-08-08 MED ORDER — HYDRALAZINE HCL 50 MG PO TABS
ORAL_TABLET | ORAL | Status: AC
  Filled 2021-08-08: qty 1

## 2021-08-08 MED ORDER — AMLODIPINE BESYLATE 5 MG PO TABS
ORAL_TABLET | ORAL | Status: AC
  Filled 2021-08-08: qty 10

## 2021-08-08 MED ORDER — CARVEDILOL 3.125 MG PO TABS
ORAL_TABLET | ORAL | Status: AC
  Filled 2021-08-08: qty 8

## 2021-08-08 MED ORDER — CLOPIDOGREL BISULFATE 75 MG PO TABS
ORAL_TABLET | ORAL | Status: AC
  Filled 2021-08-08: qty 1

## 2021-08-08 MED ORDER — ISOSORBIDE MONONITRATE ER 30 MG PO TB24: 60.0000 mg | ORAL_TABLET | Freq: Every day | ORAL | Status: DC

## 2021-08-08 MED ORDER — LEVETIRACETAM 100 MG/ML PO SOLN
1500.0000 mg | Freq: Two times a day (BID) | ORAL | Status: DC
  Administered 2021-08-08 – 2021-08-09 (×5): 1500 mg
  Filled 2021-08-08 (×9): qty 15

## 2021-08-08 MED ORDER — PANTOPRAZOLE 2 MG/ML SUSPENSION
40.0000 mg | Freq: Every day | ORAL | Status: DC
  Administered 2021-08-08 – 2021-08-09 (×2): 40 mg
  Filled 2021-08-08: qty 20

## 2021-08-08 MED ORDER — CHLORHEXIDINE GLUCONATE CLOTH 2 % EX PADS
6.0000 | MEDICATED_PAD | Freq: Every day | CUTANEOUS | Status: DC
  Administered 2021-08-08 – 2021-08-09 (×2): 6 via TOPICAL

## 2021-08-08 MED ORDER — HYDRALAZINE HCL 25 MG PO TABS
ORAL_TABLET | ORAL | Status: AC
  Filled 2021-08-08: qty 2

## 2021-08-08 NOTE — Progress Notes (Signed)
PROGRESS NOTE    [name]  TRR:[id number] DOB: Jan 10, 1961 DOA: 08/07/2021 PCP: Merryl Hacker, No   Brief Narrative:  HPI: [name] is a 60 y.o. 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  Patient is unable to provide any history which is otherwise obtained by EDP and chart review.  Patient is minimally verbal and bedbound at baseline after prior stroke.  She reportedly is able to communicate by nodding her head yes or no or by fragmented speech.  Today at her facility she was noted to be unable to communicate in her usual manner, not responding to name or painful stimuli.  She was sent to the ED for further evaluation.   ED Course:  Initial vitals showed BP 170/107, pulse 84, RR 18, temp 98.3 F, SPO2 100% on room air.   Labs show sodium 157, potassium 4.4, chloride 118, bicarb 30, BUN 112, creatinine 1.21 (previously 0.9 in July 2022), serum glucose 69, calcium 10.5 (11.3 when corrected for hypoalbuminemia), albumin 3.0, AST 63, ALT 123, alk phos 90, total bilirubin 0.3, WBC 16.4, hemoglobin 9.7, platelets 166,000.  Ammonia 26, UDS negative, serum ethanol undetectable.   Urinalysis shows negative nitrites, large leukocytes, 11-20 RBC/hpf, >50 WBC/hpf, many bacteria microscopy.  COVID and influenza PCR negative.   Portable chest x-ray negative for focal consolidation, edema, effusion.   CT head without contrast negative for acute CT finding.  Atrophy and chronic small vessel ischemic changes noted.  Old right anterior cerebral artery territory stroke shows progressive volume loss with some dystrophic calcification.   Patient was given 500 cc normal saline followed by 0.45  saline at 150 mL/hour.  Patient received 1 g IV ceftriaxone.  The hospitalist service was consulted to admit for further evaluation and management.  Assessment & Plan:   Principal Problem:   Hypernatremia Active Problems:   HTN (hypertension)   Anemia, iron deficiency   AKI (acute kidney injury) ([place])   Dysphagia, post-stroke   History of CVA with residual deficit   Seizure disorder (HCC)   UTI (urinary tract infection) due to urinary indwelling Foley catheter (HCC)   Hypercalcemia   Acute metabolic encephalopathy/hypernatremia: Secondary to hypernatremia, slightly improved but not back to baseline according to daughter was at the bedside.  Continue to treat hypernatremia.  Presented with sodium of 157, went up to 164 and down to 150 for now.  Goal to correct sodium less than 140 within 24 hours of arrival.  She is on dextrose 5% 100 cc/h.  Per guidelines, she should be on 3 mL/kg/h and for that reason, I am going to increase her fluid level at 175 cc/h and monitor sodium every 3 hours.  If her encephalopathy were not to resolve with resolution of hypernatremia, we may consider further work-up for stroke with MRI brain.  CT head is already negative.  She does not have any new focal deficit other than what is known and chronic secondary to previous stroke.  CAUTI/neurogenic bladder with indwelling Foley catheter: Continue Rocephin and follow culture.  Continue Foley care.   Hypercalcemia: Resolved with IV hydration.  Acute thrombocytopenia: No signs of bleeding.  141 platelets.  Monitor.  Elevated LFTs: Slightly better than yesterday.  Source unknown.  Monitor LFTs.   CKD stage IIIb: Please  note that patient does not have AKI as documented in H&P.  She has CKD stage IIIb and her creatinine is now better than her baseline.   History of CVA (residual spastic paraplegia, bedbound w/ contractures, severe dysarthria): Somewhat worsening level of interaction on admission.  Continue Plavix,  baclofen.   Dysphagia/PEG tube dependent: Continue PEG tube care.   Seizure disorder: Continue Keppra and phenytoin.   Hypertension: Slightly elevated, has not received her medications yet. Continue amlodipine, Coreg, hydralazine.   Anemia of chronic disease.  Stable.   Hyperlipidemia: Med rec states that patient no longer taking statin.   Depression/anxiety: Continue Paxil.   DVT prophylaxis: enoxaparin (LOVENOX) injection 40 mg Start: 08/07/21 2130   Code Status: DNR  Family Communication: Patient's daughter at bedside, plan of care discussed in length.  Status is: Inpatient  Remains inpatient appropriate because: Needs inpatient management of hypernatremia.   Estimated body mass index is 25.7 kg/m as calculated from the following:   Height as of this encounter: 5' 3"  (1.6 m).   Weight as of this encounter: 65.8 kg.     Nutritional Assessment: Body mass index is 25.7 kg/m.Marland Kitchen Seen by dietician.  I agree with the assessment and plan as outlined below: Nutrition Status:    Skin Assessment: I have examined the patient's skin and I agree with the wound assessment as performed by the wound care RN as outlined below:    Consultants:  None  Procedures:  None  Antimicrobials:  Anti-infectives (From admission, onward)    Start     Dose/Rate Route Frequency Ordered Stop   08/08/21 1800  cefTRIAXone (ROCEPHIN) 1 g in sodium chloride 0.9 % 100 mL IVPB        1 g 200 mL/hr over 30 Minutes Intravenous Every 24 hours 08/07/21 2118     08/07/21 1830  cefTRIAXone (ROCEPHIN) 1 g in sodium chloride 0.9 % 100 mL IVPB        1 g 200 mL/hr over 30 Minutes Intravenous  Once 08/07/21 1829 08/07/21 2030          Subjective: Patient seen and examined.  Daughter at the bedside.  Patient slightly more interactive compared to yesterday, patient is trying to tell me that she is all right.  Per daughter, she is better than yesterday but still not back to  baseline.  Objective: Vitals:   08/08/21 0530 08/08/21 0600 08/08/21 0630 08/08/21 0700  BP: (!) 168/89 (!) 183/97 (!) 174/94 (!) 183/101  Pulse: 80 84 82 82  Resp: 17 (!) 22 (!) 23 (!) 28  Temp: 97.8 F (36.6 C)     TempSrc: Axillary     SpO2: 100% 100% 100% 100%  Weight:      Height:        Intake/Output Summary (Last 24 hours) at 08/08/2021 0951 Last data filed at 08/08/2021 0457 Gross per 24 hour  Intake 1523.71 ml  Output 1175 ml  Net 348.71 ml   Filed Weights   08/07/21 1434  Weight: 65.8 kg    Examination:  General exam: Appears calm and comfortable  Respiratory system: Diminished breath sounds due to poor inspiratory effort. Cardiovascular system: S1 & S2 heard, RRR. No JVD, murmurs, rubs, gallops or clicks. No pedal edema. Gastrointestinal system: Abdomen is nondistended, soft and nontender. No organomegaly or masses felt. Normal bowel sounds heard. Central nervous system: Alert, unable to assess orientation.  Has paraplegia and paresis of the left upper extremity, has 4/5 power in right upper extremity.  Data Reviewed: I have personally reviewed following labs and imaging studies  CBC: Recent Labs  Lab 08/07/21 1438 08/07/21 1448 08/07/21 1449 08/08/21 0447  WBC 16.4*  --   --  13.5*  NEUTROABS 10.7*  --   --   --   HGB 9.7* 9.5* 9.9* 9.0*  HCT 31.9* 28.0* 29.0* 30.1*  MCV 94.7  --   --  95.3  PLT 166  --   --  650*   Basic Metabolic Panel: Recent Labs  Lab 08/07/21 1438 08/07/21 1448 08/07/21 1449 08/08/21 0447  NA 157* 162* 162* 154*  K 4.4 4.3 4.4 3.7  CL 118* 123*  --  117*  CO2 30  --   --  26  GLUCOSE 69* 69*  --  165*  BUN 112* 122*  --  88*  CREATININE 1.21* 1.40*  --  1.01*  CALCIUM 10.5*  --   --  9.9   GFR: Estimated Creatinine Clearance: 54.7 mL/min (A) (by C-G formula based on SCr of 1.01 mg/dL (H)). Liver Function Tests: Recent Labs  Lab 08/07/21 1438 08/08/21 0447  AST 63* 45*  ALT 123* 122*  ALKPHOS 90 83  BILITOT  0.3 0.2*  PROT 8.0 7.2  ALBUMIN 3.0* 2.7*   No results for input(s): LIPASE, AMYLASE in the last 168 hours. Recent Labs  Lab 08/07/21 1439  AMMONIA 26   Coagulation Profile: Recent Labs  Lab 08/07/21 1438  INR 1.3*   Cardiac Enzymes: No results for input(s): CKTOTAL, CKMB, CKMBINDEX, TROPONINI in the last 168 hours. BNP (last 3 results) No results for input(s): PROBNP in the last 8760 hours. HbA1C: No results for input(s): HGBA1C in the last 72 hours. CBG: No results for input(s): GLUCAP in the last 168 hours. Lipid Profile: No results for input(s): CHOL, HDL, LDLCALC, TRIG, CHOLHDL, LDLDIRECT in the last 72 hours. Thyroid Function Tests: No results for input(s): TSH, T4TOTAL, FREET4, T3FREE, THYROIDAB in the last 72 hours. Anemia Panel: No results for input(s): VITAMINB12, FOLATE, FERRITIN, TIBC, IRON, RETICCTPCT in the last 72 hours. Sepsis Labs: No results for input(s): PROCALCITON, LATICACIDVEN in the last 168 hours.  Recent Results (from the past 240 hour(s))  Resp Panel by RT-PCR (Flu A&B, Covid) Nasopharyngeal Swab     Status: None   Collection Time: 08/07/21  2:38 PM   Specimen: Nasopharyngeal Swab; Nasopharyngeal(NP) swabs in vial transport medium  Result Value Ref Range Status   SARS Coronavirus 2 by RT PCR NEGATIVE NEGATIVE Final    Comment: (NOTE) SARS-CoV-2 target nucleic acids are NOT DETECTED.  The SARS-CoV-2 RNA is generally detectable in upper respiratory specimens during the acute phase of infection. The lowest concentration of SARS-CoV-2 viral copies this assay can detect is 138 copies/mL. A negative result does not preclude SARS-Cov-2 infection and should not be used as the sole basis for treatment or other patient management decisions. A negative result may occur with  improper specimen collection/handling, submission of specimen other than nasopharyngeal swab, presence of viral mutation(s) within the areas targeted by this assay, and inadequate  number of viral copies(<138 copies/mL). A negative result must be combined with clinical observations, patient history, and epidemiological information. The expected result is Negative.  Fact Sheet for Patients:  EntrepreneurPulse.com.au  Fact Sheet for Healthcare Providers:  IncredibleEmployment.be  This test is no t yet approved or cleared by the [place] FDA and  has been authorized for detection and/or diagnosis of SARS-CoV-2 by FDA under an Emergency Use Authorization (EUA). This EUA  will remain  in effect (meaning this test can be used) for the duration of the COVID-19 declaration under Section 564(b)(1) of the Act, 21 U.S.C.section 360bbb-3(b)(1), unless the authorization is terminated  or revoked sooner.       Influenza A by PCR NEGATIVE NEGATIVE Final   Influenza B by PCR NEGATIVE NEGATIVE Final    Comment: (NOTE) The Xpert Xpress SARS-CoV-2/FLU/RSV plus assay is intended as an aid in the diagnosis of influenza from Nasopharyngeal swab specimens and should not be used as a sole basis for treatment. Nasal washings and aspirates are unacceptable for Xpert Xpress SARS-CoV-2/FLU/RSV testing.  Fact Sheet for Patients: EntrepreneurPulse.com.au  Fact Sheet for Healthcare Providers: IncredibleEmployment.be  This test is not yet approved or cleared by the [place] FDA and has been authorized for detection and/or diagnosis of SARS-CoV-2 by FDA under an Emergency Use Authorization (EUA). This EUA will remain in effect (meaning this test can be used) for the duration of the COVID-19 declaration under Section 564(b)(1) of the Act, 21 U.S.C. section 360bbb-3(b)(1), unless the authorization is terminated or revoked.  Performed at [facility], [place] [address], [place], [place] [address]       Radiology Studies: CT HEAD WO CONTRAST  Result Date: 08/07/2021 CLINICAL DATA:  Neuro deficit,  acute, stroke suspected. EXAM: CT HEAD WITHOUT CONTRAST TECHNIQUE: Contiguous axial images were obtained from the base of the skull through the vertex without intravenous contrast. COMPARISON:  06/13/2020 FINDINGS: Brain: There is generalized brain atrophy. Chronic small-vessel ischemic changes affect the pons. There are old small vessel infarctions within the cerebellum. Cerebral hemispheres show chronic small-vessel ischemic changes throughout the white matter as well as old infarction in the right anterior cerebral artery territory affecting the cingulate gyrus. This was acute 1 year ago. There is some dystrophic calcification within the region of old infarction. No sign of acute infarction, mass lesion, obstructive hydrocephalus or extra-axial collection. Vascular: There is atherosclerotic calcification of the major vessels at the base of the brain. Skull: Negative Sinuses/Orbits: Clear/normal Other: None IMPRESSION: No acute CT finding. Atrophy and chronic small-vessel ischemic changes. Old right anterior cerebral artery territory stroke which was acute 1 year ago and shows progressive volume loss with some dystrophic calcification. Electronically Signed   By: Nelson Chimes M.D.   On: 08/07/2021 16:41   DG Chest Portable 1 View  Result Date: 08/07/2021 CLINICAL DATA:  Altered mental status, history of stroke, hypertension EXAM: PORTABLE CHEST 1 VIEW COMPARISON:  Portable exam 1505 hours compared to 06/14/2020 FINDINGS: Normal heart size, mediastinal contours, and pulmonary vascularity. Atherosclerotic calcification at aortic arch. Lungs clear. No pulmonary infiltrate, pleural effusion, or pneumothorax. No acute osseous findings. IMPRESSION: No acute abnormalities. Aortic Atherosclerosis (ICD10-I70.0). Electronically Signed   By: Lavonia Dana M.D.   On: 08/07/2021 15:12    Scheduled Meds:  amantadine  100 mg Per Tube BID WC   amLODipine  10 mg Per Tube Daily   baclofen  10 mg Per Tube BID   carvedilol   25 mg Per Tube BID WC   clopidogrel  75 mg Per Tube Daily   enoxaparin (LOVENOX) injection  40 mg Subcutaneous Q24H   feeding supplement (PROSource TF)  30 mL Per Tube TID WC   hydrALAZINE  50 mg Per Tube TID   levETIRAcetam  1,500 mg Per Tube BID   pantoprazole sodium  40 mg Per Tube Daily   pantoprazole sodium  40 mg Per Tube Daily   PARoxetine  10 mg  Per Tube Daily   phenytoin  100 mg Per Tube TID   sodium chloride flush  3 mL Intravenous Q12H   sucralfate  1 g Per Tube QID   Continuous Infusions:  cefTRIAXone (ROCEPHIN)  IV       LOS: 1 day   Time spent: 35 minutes   Darliss Cheney, MD [facility]  08/08/2021, 9:51 AM  Please page via [facility] and do not message via secure chat for anything urgent. Secure chat can be used for anything non urgent.  How to contact the [facility] Attending or Consulting provider Elias-Fela Solis or covering provider during after hours [place], for this patient?  Check the care team in [facility] and look for a) attending/consulting [facility] provider listed and b) the [facility] team listed. Page or secure chat [phone]. Log into [email or web address] and use [place]'s universal password to access. If you do not have the password, please contact the hospital operator. Locate the [facility] provider you are looking for under [facility] and page to a number that you can be directly reached. If you still have difficulty reaching the provider, please page the [facility] (Director on Call) for the Hospitalists listed on amion for assistance.

## 2021-08-08 NOTE — ED Notes (Signed)
Pt resting on stretcher with eyes closed, respirations even and unlabored. Pt shaking head yes/no to answer questions. Has garbled speech noted when trying to talk. Pt covered up with blankets. Lights dimmed, call bell within reach, side rails up x2. IVF infusing without difficulty. No acute changes noted. Will continue to monitor.

## 2021-08-08 NOTE — ED Notes (Signed)
Medications have not came from pharmacy

## 2021-08-08 NOTE — ED Notes (Signed)
Pt brief is dry and clean at this time

## 2021-08-08 NOTE — Plan of Care (Signed)
°  Problem: Clinical Measurements: °Goal: Will remain free from infection °Outcome: Progressing °  °Problem: Activity: °Goal: Risk for activity intolerance will decrease °Outcome: Progressing °  °Problem: Nutrition: °Goal: Adequate nutrition will be maintained °Outcome: Progressing °  °

## 2021-08-09 DIAGNOSIS — G9341 Metabolic encephalopathy: Secondary | ICD-10-CM

## 2021-08-09 LAB — CBC WITH DIFFERENTIAL/PLATELET
Abs Immature Granulocytes: 0.07 10*3/uL (ref 0.00–0.07)
Basophils Absolute: 0.1 10*3/uL (ref 0.0–0.1)
Basophils Relative: 1 %
Eosinophils Absolute: 0.9 10*3/uL — ABNORMAL HIGH (ref 0.0–0.5)
Eosinophils Relative: 8 %
HCT: 27.9 % — ABNORMAL LOW (ref 36.0–46.0)
Hemoglobin: 8.6 g/dL — ABNORMAL LOW (ref 12.0–15.0)
Immature Granulocytes: 1 %
Lymphocytes Relative: 22 %
Lymphs Abs: 2.5 10*3/uL (ref 0.7–4.0)
MCH: 28.2 pg (ref 26.0–34.0)
MCHC: 30.8 g/dL (ref 30.0–36.0)
MCV: 91.5 fL (ref 80.0–100.0)
Monocytes Absolute: 1.3 10*3/uL — ABNORMAL HIGH (ref 0.1–1.0)
Monocytes Relative: 12 %
Neutro Abs: 6.6 10*3/uL (ref 1.7–7.7)
Neutrophils Relative %: 56 %
Platelets: 134 10*3/uL — ABNORMAL LOW (ref 150–400)
RBC: 3.05 MIL/uL — ABNORMAL LOW (ref 3.87–5.11)
RDW: 15.9 % — ABNORMAL HIGH (ref 11.5–15.5)
WBC: 11.5 10*3/uL — ABNORMAL HIGH (ref 4.0–10.5)
nRBC: 0 % (ref 0.0–0.2)

## 2021-08-09 LAB — RESP PANEL BY RT-PCR (FLU A&B, COVID) ARPGX2
Influenza A by PCR: NEGATIVE
Influenza B by PCR: NEGATIVE
SARS Coronavirus 2 by RT PCR: NEGATIVE

## 2021-08-09 LAB — BASIC METABOLIC PANEL
Anion gap: 8 (ref 5–15)
BUN: 58 mg/dL — ABNORMAL HIGH (ref 6–20)
CO2: 23 mmol/L (ref 22–32)
Calcium: 9.3 mg/dL (ref 8.9–10.3)
Chloride: 108 mmol/L (ref 98–111)
Creatinine, Ser: 0.74 mg/dL (ref 0.44–1.00)
GFR, Estimated: >60 mL/min (ref >60)
Glucose, Bld: 101 mg/dL — ABNORMAL HIGH (ref 70–99)
Potassium: 3.1 mmol/L — ABNORMAL LOW (ref 3.5–5.1)
Sodium: 139 mmol/L (ref 135–145)

## 2021-08-09 LAB — MAGNESIUM: Magnesium: 2.2 mg/dL (ref 1.7–2.4)

## 2021-08-09 MED ORDER — POTASSIUM CHLORIDE 10 MEQ/100ML IV SOLN
10.0000 meq | INTRAVENOUS | Status: AC
  Administered 2021-08-09 (×5): 10 meq via INTRAVENOUS
  Filled 2021-08-09 (×5): qty 100

## 2021-08-09 MED ORDER — SULFAMETHOXAZOLE-TRIMETHOPRIM 800-160 MG PO TABS: 1.0000 | ORAL_TABLET | Freq: Two times a day (BID) | ORAL | 0 refills | Status: AC

## 2021-08-09 MED ORDER — TRAMADOL HCL 50 MG PO TABS: 50.0000 mg | ORAL_TABLET | Freq: Four times a day (QID) | ORAL | 0 refills | Status: AC | PRN

## 2021-08-09 MED ORDER — HYDRALAZINE HCL 50 MG PO TABS
100.0000 mg | ORAL_TABLET | Freq: Three times a day (TID) | ORAL | Status: DC
Start: 2021-08-09 — End: 2021-08-10
  Administered 2021-08-09 (×2): 100 mg
  Filled 2021-08-09 (×2): qty 2

## 2021-08-09 NOTE — Plan of Care (Signed)
  Problem: Clinical Measurements: Goal: Will remain free from infection Outcome: Progressing   Problem: Activity: Goal: Risk for activity intolerance will decrease Outcome: Progressing   Problem: Nutrition: Goal: Adequate nutrition will be maintained Outcome: Progressing   Problem: Skin Integrity: Goal: Risk for impaired skin integrity will decrease Outcome: Progressing   

## 2021-08-09 NOTE — Progress Notes (Addendum)
[place] for report 2x but unable to give to a live person.   18:30 Able to give report to staff in [facility] facility .

## 2021-08-09 NOTE — Discharge Summary (Signed)
Physician Discharge Summary  [name]:[id number] DOB: March 18, 1961 DOA: 08/07/2021  PCP: Pcp, No  Admit date: 08/07/2021 Discharge date: 08/09/2021 30 Day Unplanned Readmission Risk Score    Flowsheet Row ED to Hosp-Admission (Current) from 08/07/2021 in [facility]  30 Day Unplanned Readmission Risk Score (%) 21.13 Filed at 08/09/2021 1200       This score is the patient's risk of an unplanned readmission within 30 days of being discharged (0 -100%). The score is based on dignosis, age, lab data, medications, orders, and past utilization.   Low:  0-14.9   Medium: 15-21.9   High: 22-29.9   Extreme: 30 and above          Admitted From:  nursing home Disposition: Nursing home  Recommendations for Outpatient Follow-up:  Follow up with PCP in 1-2 weeks Please obtain BMP/CBC in one week Please follow up with your PCP on the following pending results: Unresulted Labs (From admission, onward)     Start     Ordered   08/09/21 1305  Resp Panel by RT-PCR (Flu A&B, Covid) Nasopharyngeal Swab  (Tier 2 - Symptomatic/asymptomatic)  Once,   R        08/09/21 1304   08/07/21 2014  Urine Culture  Add-on,   AD       Question:  Indication  Answer:  Altered mental status (if no other cause identified)   08/07/21 2013              Home Health: None Equipment/Devices: None  Discharge Condition: Stable CODE STATUS: DNR Diet recommendation: Per PEG tube  Subjective: Seen and examined.  Slightly more alert than yesterday.  At baseline, almost nonverbal, tries to talk, communicates with nodding her head, at her baseline now.  Following HPI and ED course is copied from my colleague admitting hospitalist Dr. Serita Grit H&P. HPI: [name] is a 60 y.o. 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  Patient is unable to provide any history which is otherwise obtained by EDP and chart review.  Patient is minimally verbal and bedbound at baseline after prior stroke.  She reportedly is able to communicate by nodding her head yes or no or by fragmented speech.  Today at her facility she was noted to be unable to communicate in her usual manner, not responding to name or painful stimuli.  She was sent to the ED for further evaluation.   ED Course:  Initial vitals showed BP 170/107, pulse 84, RR 18, temp 98.3 F, SPO2 100% on room air.   Labs show sodium 157, potassium 4.4, chloride 118, bicarb 30, BUN 112, creatinine 1.21 (previously 0.9 in July 2022), serum glucose 69, calcium 10.5 (11.3 when corrected for hypoalbuminemia), albumin 3.0, AST 63, ALT 123, alk phos 90, total bilirubin 0.3, WBC 16.4, hemoglobin 9.7, platelets 166,000.  Ammonia 26, UDS negative, serum ethanol undetectable.   Urinalysis shows negative nitrites, large leukocytes, 11-20 RBC/hpf, >50 WBC/hpf, many bacteria microscopy.  COVID and influenza PCR negative.   Portable chest x-ray negative for focal consolidation, edema, effusion.   CT head without contrast negative for acute CT finding.  Atrophy and chronic small vessel ischemic changes noted.  Old right anterior cerebral artery territory stroke shows progressive volume  loss with some dystrophic calcification.   Patient was given 500 cc normal saline followed by 0.45 saline at 150 mL/hour.  Patient received 1 g IV ceftriaxone.  The hospitalist service was consulted to admit for further evaluation and management.  Brief/Interim Summary: Patient was basically admitted under hospital service due to multiple medical problems which are listed below and the plan of care as well.  Acute metabolic encephalopathy/hypernatremia: Secondary  to hypernatremia, now sodium is back to treatment, after treating with dextrose 5% and with improvement in her sodium, patient's mental status has improved and daughter has verified that this is her baseline now.  Her current sodium is 139, she presented with 157.  CT head was negative.     CAUTI/neurogenic bladder with indwelling Foley catheter: She was started on Rocephin for UTI.  Urine culture is still in process.  She is being discharged on 4 more days of Bactrim DS.   Hypercalcemia: Resolved with IV hydration.   Acute thrombocytopenia: No signs of bleeding.  134 today.  Likely due to acute illness, hopefully this will improve.   Elevated LFTs: Slightly better than yesterday.  Source unknown.  Monitor LFTs.   CKD stage IIIb: Please note that patient does not have AKI as documented in H&P.  She has CKD stage IIIb and her creatinine is now better than her baseline.   History of CVA (residual spastic paraplegia, bedbound w/ contractures, severe dysarthria): Somewhat worsening level of interaction on admission.  Continue Plavix, baclofen.   Dysphagia/PEG tube dependent: Continue PEG tube care.   Seizure disorder: Continue Keppra and phenytoin.   Hypertension: Controlled. Continue amlodipine, Coreg, hydralazine.   Anemia of chronic disease.  Stable.   Hyperlipidemia: Med rec states that patient no longer taking statin.   Depression/anxiety: Continue Paxil.  Discharge Diagnoses:  Principal Problem:   Hypernatremia Active Problems:   HTN (hypertension)   Anemia, iron deficiency   AKI (acute kidney injury) ([place])   Dysphagia, post-stroke   History of CVA with residual deficit   Seizure disorder (HCC)   UTI (urinary tract infection) due to urinary indwelling Foley catheter ([place])   Hypercalcemia   Acute metabolic encephalopathy    Discharge Instructions   Allergies as of 08/09/2021   No Known Allergies      Medication List     STOP taking these medications     aspirin 325 MG EC tablet   atorvastatin 40 MG tablet Commonly known as: LIPITOR   diclofenac Sodium 1 % Gel Commonly known as: VOLTAREN   FLUoxetine 10 MG capsule Commonly known as: PROZAC       TAKE these medications    acetaminophen 325 MG tablet Commonly known as: TYLENOL Take 2 tablets (650 mg total) by mouth every 4 (four) hours as needed for mild pain (or temp > 37.5 C (99.5 F)).   amantadine 50 MG/5ML solution Commonly known as: SYMMETREL Take 10 mLs (100 mg total) by mouth 2 (two) times daily with breakfast and lunch. What changed: how to take this   amLODipine 10 MG tablet Commonly known as: NORVASC Take 1 tablet (10 mg total) by mouth daily. What changed: how to take this   ascorbic acid 500 MG tablet Commonly known as: VITAMIN C Place 500 mg into feeding tube in the morning and at bedtime.   baclofen 10 MG tablet Commonly known as: LIORESAL Take 1 tablet (10 mg total) by mouth 2 (two) times daily.   carvedilol 25 MG tablet Commonly known as: Posey  25 mg into feeding tube in the morning and at bedtime.   clonazePAM 0.5 MG tablet Commonly known as: KlonoPIN Take 0.5 tablets (0.25 mg total) by mouth 2 (two) times daily as needed for anxiety.   clopidogrel 75 MG tablet Commonly known as: PLAVIX Take 1 tablet (75 mg total) by mouth daily. What changed: how to take this   feeding supplement (PRO-STAT 64) Liqd Place 30 mLs into feeding tube 3 (three) times daily with meals.   ferrous sulfate 325 (65 FE) MG tablet Take 1 tablet (325 mg total) by mouth 2 (two) times daily with a meal. What changed:  how to take this additional instructions   gabapentin 100 MG capsule Commonly known as: NEURONTIN 100 mg in the morning and at bedtime. Per tube   hydrALAZINE 100 MG tablet Commonly known as: APRESOLINE Take 1 tablet (100 mg total) by mouth every 8 (eight) hours. What changed:  how much to take how to take this when to take this    isosorbide mononitrate 60 MG 24 hr tablet Commonly known as: IMDUR Take 1 tablet (60 mg total) by mouth daily. What changed:  how to take this when to take this additional instructions   levETIRAcetam 100 MG/ML solution Commonly known as: KEPPRA Place 15 mLs into feeding tube in the morning and at bedtime.   liver oil-zinc oxide 40 % ointment Commonly known as: DESITIN Apply 1 application topically 3 (three) times daily. Apply to bilateral gluteal folds topically every shift for excoriation   melatonin 3 MG Tabs tablet Take 1 tablet (3 mg total) by mouth at bedtime.   Metoprolol Tartrate 75 MG Tabs Take 75 mg by mouth 2 (two) times daily.   multivitamin with minerals Tabs tablet Take 1 tablet by mouth daily. What changed: how to take this   omeprazole 40 MG capsule Commonly known as: PRILOSEC Take 1 capsule (40 mg total) by mouth 2 (two) times daily for 60 doses. Open up 1 capsule & sprinkle on food twice daily   ondansetron 4 MG tablet Commonly known as: ZOFRAN Place 4 mg into feeding tube every 6 (six) hours as needed.   pantoprazole sodium 40 mg Commonly known as: PROTONIX Place 40 mg into feeding tube daily.   PARoxetine 10 MG tablet Commonly known as: PAXIL Place 10 mg into feeding tube daily.   phenytoin 125 MG/5ML suspension Commonly known as: DILANTIN Place 4 mLs into feeding tube 3 (three) times daily.   Phytoplex Z-Guard 57-17 % Pste Generic drug: Petrolatum-Zinc Oxide Apply 1 application topically in the morning, at noon, and at bedtime. Apply to bilateral buttocks topically every shift for moisture barrier cream   polyethylene glycol 17 g packet Commonly known as: MIRALAX / GLYCOLAX Take 17 g by mouth 2 (two) times daily.   sucralfate 1 g tablet Commonly known as: Carafate Take 1 tablet (1 g total) by mouth 4 (four) times daily. Mix 1 tablet with 10-40ms of water to make a slurry and take by mouth four times daily with meals and at bedtime What  changed: how to take this   sulfamethoxazole-trimethoprim 800-160 MG tablet Commonly known as: BACTRIM DS Place 1 tablet into feeding tube 2 (two) times daily for 4 days.   traMADol 50 MG tablet Commonly known as: ULTRAM Take 1 tablet (50 mg total) by mouth every 6 (six) hours as needed for moderate pain.        No Known Allergies  Consultations: None   Procedures/Studies: CT HEAD WO  CONTRAST  Result Date: 08/07/2021 CLINICAL DATA:  Neuro deficit, acute, stroke suspected. EXAM: CT HEAD WITHOUT CONTRAST TECHNIQUE: Contiguous axial images were obtained from the base of the skull through the vertex without intravenous contrast. COMPARISON:  06/13/2020 FINDINGS: Brain: There is generalized brain atrophy. Chronic small-vessel ischemic changes affect the pons. There are old small vessel infarctions within the cerebellum. Cerebral hemispheres show chronic small-vessel ischemic changes throughout the white matter as well as old infarction in the right anterior cerebral artery territory affecting the cingulate gyrus. This was acute 1 year ago. There is some dystrophic calcification within the region of old infarction. No sign of acute infarction, mass lesion, obstructive hydrocephalus or extra-axial collection. Vascular: There is atherosclerotic calcification of the major vessels at the base of the brain. Skull: Negative Sinuses/Orbits: Clear/normal Other: None IMPRESSION: No acute CT finding. Atrophy and chronic small-vessel ischemic changes. Old right anterior cerebral artery territory stroke which was acute 1 year ago and shows progressive volume loss with some dystrophic calcification. Electronically Signed   By: Nelson Chimes M.D.   On: 08/07/2021 16:41   DG Chest Portable 1 View  Result Date: 08/07/2021 CLINICAL DATA:  Altered mental status, history of stroke, hypertension EXAM: PORTABLE CHEST 1 VIEW COMPARISON:  Portable exam 1505 hours compared to 06/14/2020 FINDINGS: Normal heart size,  mediastinal contours, and pulmonary vascularity. Atherosclerotic calcification at aortic arch. Lungs clear. No pulmonary infiltrate, pleural effusion, or pneumothorax. No acute osseous findings. IMPRESSION: No acute abnormalities. Aortic Atherosclerosis (ICD10-I70.0). Electronically Signed   By: Lavonia Dana M.D.   On: 08/07/2021 15:12     Discharge Exam: Vitals:   08/09/21 0530 08/09/21 0900  BP: (!) 166/76 (!) 153/87  Pulse: 78 69  Resp:    Temp: 99.1 F (37.3 C) 97.8 F (36.6 C)  SpO2: 98% 99%   Vitals:   08/08/21 1806 08/08/21 2202 08/09/21 0530 08/09/21 0900  BP: 125/74 (!) 165/91 (!) 166/76 (!) 153/87  Pulse: 66 70 78 69  Resp: 20 18    Temp: 98.6 F (37 C) 98.4 F (36.9 C) 99.1 F (37.3 C) 97.8 F (36.6 C)  TempSrc:   Oral Oral  SpO2: 100% 98% 98% 99%  Weight: 40.5 kg     Height: 5' 3"  (1.6 m)       General: Pt is alert, awake, not in acute distress Cardiovascular: RRR, S1/S2 +, no rubs, no gallops Respiratory: CTA bilaterally, no wheezing, no rhonchi Abdominal: Soft, NT, ND, bowel sounds + Extremities: no edema, no cyanosis    The results of significant diagnostics from this hospitalization (including imaging, microbiology, ancillary and laboratory) are listed below for reference.     Microbiology: Recent Results (from the past 240 hour(s))  Resp Panel by RT-PCR (Flu A&B, Covid) Nasopharyngeal Swab     Status: None   Collection Time: 08/07/21  2:38 PM   Specimen: Nasopharyngeal Swab; Nasopharyngeal(NP) swabs in vial transport medium  Result Value Ref Range Status   SARS Coronavirus 2 by RT PCR NEGATIVE NEGATIVE Final    Comment: (NOTE) SARS-CoV-2 target nucleic acids are NOT DETECTED.  The SARS-CoV-2 RNA is generally detectable in upper respiratory specimens during the acute phase of infection. The lowest concentration of SARS-CoV-2 viral copies this assay can detect is 138 copies/mL. A negative result does not preclude SARS-Cov-2 infection and should not  be used as the sole basis for treatment or other patient management decisions. A negative result may occur with  improper specimen collection/handling, submission of specimen other than nasopharyngeal  swab, presence of viral mutation(s) within the areas targeted by this assay, and inadequate number of viral copies(<138 copies/mL). A negative result must be combined with clinical observations, patient history, and epidemiological information. The expected result is Negative.  Fact Sheet for Patients:  EntrepreneurPulse.com.au  Fact Sheet for Healthcare Providers:  IncredibleEmployment.be  This test is no t yet approved or cleared by the [place] FDA and  has been authorized for detection and/or diagnosis of SARS-CoV-2 by FDA under an Emergency Use Authorization (EUA). This EUA will remain  in effect (meaning this test can be used) for the duration of the COVID-19 declaration under Section 564(b)(1) of the Act, 21 U.S.C.section 360bbb-3(b)(1), unless the authorization is terminated  or revoked sooner.       Influenza A by PCR NEGATIVE NEGATIVE Final   Influenza B by PCR NEGATIVE NEGATIVE Final    Comment: (NOTE) The Xpert Xpress SARS-CoV-2/FLU/RSV plus assay is intended as an aid in the diagnosis of influenza from Nasopharyngeal swab specimens and should not be used as a sole basis for treatment. Nasal washings and aspirates are unacceptable for Xpert Xpress SARS-CoV-2/FLU/RSV testing.  Fact Sheet for Patients: EntrepreneurPulse.com.au  Fact Sheet for Healthcare Providers: IncredibleEmployment.be  This test is not yet approved or cleared by the [place] FDA and has been authorized for detection and/or diagnosis of SARS-CoV-2 by FDA under an Emergency Use Authorization (EUA). This EUA will remain in effect (meaning this test can be used) for the duration of the COVID-19 declaration under Section  564(b)(1) of the Act, 21 U.S.C. section 360bbb-3(b)(1), unless the authorization is terminated or revoked.  Performed at [facility], [place] [address], [place], [place] [address]      Labs: BNP (last 3 results) No results for input(s): BNP in the last 8760 hours. Basic Metabolic Panel: Recent Labs  Lab 08/07/21 1438 08/07/21 1448 08/07/21 1449 08/08/21 0447 08/08/21 1001 08/08/21 1350 08/08/21 1655 08/08/21 1928 08/08/21 2312 08/09/21 0300  NA 157* 162* 162* 154*   < > 149* 146* 144 141 139  K 4.4 4.3 4.4 3.7  --   --   --   --   --  3.1*  CL 118* 123*  --  117*  --   --   --   --   --  108  CO2 30  --   --  26  --   --   --   --   --  23  GLUCOSE 69* 69*  --  165*  --   --   --   --   --  101*  BUN 112* 122*  --  88*  --   --   --   --   --  58*  CREATININE 1.21* 1.40*  --  1.01*  --   --   --   --   --  0.74  CALCIUM 10.5*  --   --  9.9  --   --   --   --   --  9.3  MG  --   --   --   --   --   --   --   --   --  2.2   < > = values in this interval not displayed.   Liver Function Tests: Recent Labs  Lab 08/07/21 1438 08/08/21 0447  AST 63* 45*  ALT 123* 122*  ALKPHOS 90 83  BILITOT 0.3 0.2*  PROT 8.0 7.2  ALBUMIN 3.0* 2.7*  No results for input(s): LIPASE, AMYLASE in the last 168 hours. Recent Labs  Lab 08/07/21 1439  AMMONIA 26   CBC: Recent Labs  Lab 08/07/21 1438 08/07/21 1448 08/07/21 1449 08/08/21 0447 08/09/21 0300  WBC 16.4*  --   --  13.5* 11.5*  NEUTROABS 10.7*  --   --   --  6.6  HGB 9.7* 9.5* 9.9* 9.0* 8.6*  HCT 31.9* 28.0* 29.0* 30.1* 27.9*  MCV 94.7  --   --  95.3 91.5  PLT 166  --   --  141* 134*   Cardiac Enzymes: No results for input(s): CKTOTAL, CKMB, CKMBINDEX, TROPONINI in the last 168 hours. BNP: Invalid input(s): POCBNP CBG: No results for input(s): GLUCAP in the last 168 hours. D-Dimer No results for input(s): DDIMER in the last 72 hours. Hgb A1c No results for input(s): HGBA1C in the last 72 hours. Lipid  Profile No results for input(s): CHOL, HDL, LDLCALC, TRIG, CHOLHDL, LDLDIRECT in the last 72 hours. Thyroid function studies No results for input(s): TSH, T4TOTAL, T3FREE, THYROIDAB in the last 72 hours.  Invalid input(s): FREET3 Anemia work up No results for input(s): VITAMINB12, FOLATE, FERRITIN, TIBC, IRON, RETICCTPCT in the last 72 hours. Urinalysis    Component Value Date/Time   COLORURINE AMBER (A) 08/07/2021 1438   APPEARANCEUR CLOUDY (A) 08/07/2021 1438   LABSPEC 1.015 08/07/2021 1438   PHURINE 8.0 08/07/2021 1438   GLUCOSEU NEGATIVE 08/07/2021 1438   HGBUR MODERATE (A) 08/07/2021 1438   BILIRUBINUR NEGATIVE 08/07/2021 1438   KETONESUR NEGATIVE 08/07/2021 1438   PROTEINUR 100 (A) 08/07/2021 1438   UROBILINOGEN 0.2 10/09/2010 1518   NITRITE NEGATIVE 08/07/2021 1438   LEUKOCYTESUR LARGE (A) 08/07/2021 1438   Sepsis Labs Invalid input(s): PROCALCITONIN,  WBC,  LACTICIDVEN Microbiology Recent Results (from the past 240 hour(s))  Resp Panel by RT-PCR (Flu A&B, Covid) Nasopharyngeal Swab     Status: None   Collection Time: 08/07/21  2:38 PM   Specimen: Nasopharyngeal Swab; Nasopharyngeal(NP) swabs in vial transport medium  Result Value Ref Range Status   SARS Coronavirus 2 by RT PCR NEGATIVE NEGATIVE Final    Comment: (NOTE) SARS-CoV-2 target nucleic acids are NOT DETECTED.  The SARS-CoV-2 RNA is generally detectable in upper respiratory specimens during the acute phase of infection. The lowest concentration of SARS-CoV-2 viral copies this assay can detect is 138 copies/mL. A negative result does not preclude SARS-Cov-2 infection and should not be used as the sole basis for treatment or other patient management decisions. A negative result may occur with  improper specimen collection/handling, submission of specimen other than nasopharyngeal swab, presence of viral mutation(s) within the areas targeted by this assay, and inadequate number of viral copies(<138  copies/mL). A negative result must be combined with clinical observations, patient history, and epidemiological information. The expected result is Negative.  Fact Sheet for Patients:  EntrepreneurPulse.com.au  Fact Sheet for Healthcare Providers:  IncredibleEmployment.be  This test is no t yet approved or cleared by the [place] FDA and  has been authorized for detection and/or diagnosis of SARS-CoV-2 by FDA under an Emergency Use Authorization (EUA). This EUA will remain  in effect (meaning this test can be used) for the duration of the COVID-19 declaration under Section 564(b)(1) of the Act, 21 U.S.C.section 360bbb-3(b)(1), unless the authorization is terminated  or revoked sooner.       Influenza A by PCR NEGATIVE NEGATIVE Final   Influenza B by PCR NEGATIVE NEGATIVE Final    Comment: (NOTE) The  Xpert Xpress SARS-CoV-2/FLU/RSV plus assay is intended as an aid in the diagnosis of influenza from Nasopharyngeal swab specimens and should not be used as a sole basis for treatment. Nasal washings and aspirates are unacceptable for Xpert Xpress SARS-CoV-2/FLU/RSV testing.  Fact Sheet for Patients: EntrepreneurPulse.com.au  Fact Sheet for Healthcare Providers: IncredibleEmployment.be  This test is not yet approved or cleared by the [place] FDA and has been authorized for detection and/or diagnosis of SARS-CoV-2 by FDA under an Emergency Use Authorization (EUA). This EUA will remain in effect (meaning this test can be used) for the duration of the COVID-19 declaration under Section 564(b)(1) of the Act, 21 U.S.C. section 360bbb-3(b)(1), unless the authorization is terminated or revoked.  Performed at [facility], [place] [address], [place], [place] [address]      Time coordinating discharge: Over 30 minutes  SIGNED:   Darliss Cheney, MD  [facility] 08/09/2021, 1:24 PM  If  7PM-7AM, please contact night-coverage [email or web address]

## 2021-08-09 NOTE — TOC Transition Note (Signed)
Transition of Care [facility]) - CM/SW Discharge Note   Patient Details  Name: [name] MRN: [id number] Date of Birth: [birth year]  [facility] of [facility]) CM/SW Contact:  Milinda Antis, [place] Phone Number: 08/09/2021, 4:30 PM   Clinical Narrative:     Patient will DC to:  SNF Anticipated DC date:  08/09/2021 Family notified: Yes Transport by:  Corey Harold   Per MD patient ready for DC to [facility] SNF. RN to call report prior to discharge [phone]. RN, patient, patient's family, and facility notified of DC. Discharge Summary and FL2 sent to facility. DC packet on chart. Ambulance transport requested for patient.   CSW will sign off for now as social work intervention is no longer needed. Please consult [place] again if new needs arise.          Patient Goals and CMS Choice        Discharge Placement                       Discharge Plan and Services                                     Social Determinants of Health (SDOH) Interventions     Readmission Risk Interventions No flowsheet data found.

## 2021-08-09 NOTE — TOC Progression Note (Addendum)
Transition of Care [facility]) - Initial/Assessment Note    Patient Details  Name: [name] MRN: [id number] Date of Birth: 05/06/1961  Transition [facility]) CM/SW Contact:    Milinda Antis, LCSWA Phone Number: 08/09/2021, 12:19 PM  Clinical Narrative:                 12:20- CSW contacted admissions with [facility] to inquire about the facility accepting the patient back as she is medically ready.  There was no answer.  CSW left a VM requesting a returned call.   13:28-  CSW called the admissions director with [facility].  There was no answer.  13:29-  CSW called the main number to [facility] and was on hold 25 minutes and was then informed that the admissions person is not in the office and they would ask that she contact CSW.  13:58-  CSW was contacted by the admissions director with [facility] and informed that the patient can return.  Attending and RN notified.        Patient Goals and CMS Choice        Expected Discharge Plan and Services           Expected Discharge Date: 08/09/21                                    Prior Living Arrangements/Services                       Activities of Daily Living      Permission Sought/Granted                  Emotional Assessment              Admission diagnosis:  Hypernatremia [E87.0] Altered mental status, unspecified altered mental status type [R41.82] Patient Active Problem List   Diagnosis Date Noted   Hypernatremia 08/07/2021   History of CVA with residual deficit 08/07/2021   Seizure disorder ([place]) 08/07/2021   UTI (urinary tract infection) due to urinary indwelling Foley catheter ([place]) 08/07/2021   Hypercalcemia 08/07/2021   Goals of care, counseling/discussion    Palliative care by specialist    CKD (chronic kidney disease) stage 3, GFR 30-59 ml/min (HCC)    Labile blood pressure    Slow transit constipation    Acute blood loss anemia    Left inguinal pain     Pain    Spastic hemiparesis (HCC)    Dysphagia, post-stroke    Pressure injury of skin 05/04/2020   Thrombocytosis    Leukocytosis    AKI (acute kidney injury) ([place])    Benign essential HTN    Lethargy    CVA (cerebral vascular accident) ([place]) 05/03/2020   Chronic combined systolic (congestive) and diastolic (congestive) heart failure ([place]) 05/02/2020   Hypertensive emergency    Slurred speech    Essential hypertension    Acute combined systolic (congestive) and diastolic (congestive) heart failure (HCC)    Thrombocytopenia (HCC)    Stage 3b chronic kidney disease ([place])    Prediabetes    Acute CVA (cerebrovascular accident) ([place]) 04/27/2020   Accelerated hypertension 04/27/2020   Renal dysfunction 04/27/2020   Hypokalemia 04/27/2020   HTN (hypertension) 10/26/2015   Anemia, iron deficiency 10/26/2015   Visit for preventive health examination 10/26/2015   Osteoarthritis of both knees    PCP:  Pcp, No  Pharmacy:   [id number] [place], [place] [address] [place] [address] Phone: [phone] Fax: [phone]  [facility] [address] [place] [address] Phone: [phone] Fax: [phone]     Social Determinants of Health (SDOH) Interventions    Readmission Risk Interventions No flowsheet data found.

## 2021-08-09 NOTE — Plan of Care (Signed)
  Problem: Education: Goal: Knowledge of General Education information will improve Description: Including pain rating scale, medication(s)/side effects and non-pharmacologic comfort measures Outcome: Adequate for Discharge   Problem: Health Behavior/Discharge Planning: Goal: Ability to manage health-related needs will improve Outcome: Adequate for Discharge   Problem: Clinical Measurements: Goal: Ability to maintain clinical measurements within normal limits will improve Outcome: Adequate for Discharge Goal: Will remain free from infection 08/09/2021 1720 by Dolores Hoose, RN Outcome: Adequate for Discharge 08/09/2021 1046 by Dolores Hoose, RN Outcome: Progressing Goal: Diagnostic test results will improve Outcome: Adequate for Discharge Goal: Respiratory complications will improve Outcome: Adequate for Discharge Goal: Cardiovascular complication will be avoided Outcome: Adequate for Discharge   Problem: Activity: Goal: Risk for activity intolerance will decrease 08/09/2021 1720 by Dolores Hoose, RN Outcome: Adequate for Discharge 08/09/2021 1046 by Dolores Hoose, RN Outcome: Progressing   Problem: Nutrition: Goal: Adequate nutrition will be maintained 08/09/2021 1720 by Dolores Hoose, RN Outcome: Adequate for Discharge 08/09/2021 1046 by Dolores Hoose, RN Outcome: Progressing   Problem: Coping: Goal: Level of anxiety will decrease Outcome: Adequate for Discharge   Problem: Elimination: Goal: Will not experience complications related to bowel motility Outcome: Adequate for Discharge Goal: Will not experience complications related to urinary retention Outcome: Adequate for Discharge   Problem: Pain Managment: Goal: General experience of comfort will improve Outcome: Adequate for Discharge   Problem: Safety: Goal: Ability to remain free from injury will improve Outcome: Adequate for Discharge   Problem: Skin Integrity: Goal: Risk for  impaired skin integrity will decrease 08/09/2021 1720 by Dolores Hoose, RN Outcome: Adequate for Discharge 08/09/2021 1046 by Dolores Hoose, RN Outcome: Progressing

## 2021-08-10 LAB — URINE CULTURE: Culture: >=100000 — AB

## 2021-08-10 NOTE — Progress Notes (Signed)
Called [facility] to asked what and when is [facility] coming for patient. Per [facility], they dont have the patient on their list to pick up. They asked for patient information and just added the patient. per [facility] there list is long, it will take awhile to get the patient.

## 2021-08-10 NOTE — Progress Notes (Signed)
DISCHARGE NOTE SNF  [name] to be discharged [place] facility  per MD order. Patient verbalized understanding.  Skin clean, dry and intact without evidence of skin break down, no evidence of skin tears noted. IV catheter discontinued intact. Site without signs and symptoms of complications. Dressing and pressure applied. Pt denies pain at the site currently. No complaints noted.  Discharge packet assembled. An After Visit Summary (AVS) was printed and given to the EMS personnel. Patient escorted via stretcher and discharged to [facility] via ambulance. Report called to accepting facility by dayshift RN Ren; all questions and concerns addressed.   Babs Sciara, RN

## 2021-09-20 ENCOUNTER — Other Ambulatory Visit: Payer: Self-pay

## 2021-09-20 ENCOUNTER — Emergency Department (HOSPITAL_COMMUNITY): Payer: Medicaid Other

## 2021-09-20 ENCOUNTER — Emergency Department (HOSPITAL_COMMUNITY)
Admission: EM | Admit: 2021-09-20 | Discharge: 2021-09-20 | Disposition: A | Discharge status: Skilled Nursing Facility | Payer: Medicaid Other | Attending: Emergency Medicine | Admitting: Emergency Medicine

## 2021-09-20 ENCOUNTER — Encounter (HOSPITAL_COMMUNITY): Payer: Self-pay | Admitting: Emergency Medicine

## 2021-09-20 DIAGNOSIS — R471 Dysarthria and anarthria: Secondary | ICD-10-CM | POA: Diagnosis not present

## 2021-09-20 DIAGNOSIS — R4182 Altered mental status, unspecified: Secondary | ICD-10-CM | POA: Insufficient documentation

## 2021-09-20 DIAGNOSIS — Z7902 Long term (current) use of antithrombotics/antiplatelets: Secondary | ICD-10-CM | POA: Diagnosis not present

## 2021-09-20 DIAGNOSIS — I129 Hypertensive chronic kidney disease with stage 1 through stage 4 chronic kidney disease, or unspecified chronic kidney disease: Secondary | ICD-10-CM | POA: Insufficient documentation

## 2021-09-20 DIAGNOSIS — N189 Chronic kidney disease, unspecified: Secondary | ICD-10-CM | POA: Insufficient documentation

## 2021-09-20 DIAGNOSIS — U071 COVID-19: Secondary | ICD-10-CM | POA: Insufficient documentation

## 2021-09-20 DIAGNOSIS — R059 Cough, unspecified: Secondary | ICD-10-CM | POA: Diagnosis present

## 2021-09-20 DIAGNOSIS — Z79899 Other long term (current) drug therapy: Secondary | ICD-10-CM | POA: Insufficient documentation

## 2021-09-20 DIAGNOSIS — G8190 Hemiplegia, unspecified affecting unspecified side: Secondary | ICD-10-CM | POA: Diagnosis not present

## 2021-09-20 DIAGNOSIS — N390 Urinary tract infection, site not specified: Secondary | ICD-10-CM

## 2021-09-20 LAB — COMPREHENSIVE METABOLIC PANEL
ALT: 114 U/L — ABNORMAL HIGH (ref 0–44)
AST: 50 U/L — ABNORMAL HIGH (ref 15–41)
Albumin: 2.6 g/dL — ABNORMAL LOW (ref 3.5–5.0)
Alkaline Phosphatase: 102 U/L (ref 38–126)
Anion gap: 12 (ref 5–15)
BUN: 65 mg/dL — ABNORMAL HIGH (ref 6–20)
CO2: 24 mmol/L (ref 22–32)
Calcium: 10.1 mg/dL (ref 8.9–10.3)
Chloride: 105 mmol/L (ref 98–111)
Creatinine, Ser: 1 mg/dL (ref 0.44–1.00)
GFR, Estimated: >60 mL/min (ref >60)
Glucose, Bld: 123 mg/dL — ABNORMAL HIGH (ref 70–99)
Potassium: 4.3 mmol/L (ref 3.5–5.1)
Sodium: 141 mmol/L (ref 135–145)
Total Bilirubin: 0.3 mg/dL (ref 0.3–1.2)
Total Protein: 8.2 g/dL — ABNORMAL HIGH (ref 6.5–8.1)

## 2021-09-20 LAB — CBC WITH DIFFERENTIAL/PLATELET
Abs Immature Granulocytes: 0.15 10*3/uL — ABNORMAL HIGH (ref 0.00–0.07)
Basophils Absolute: 0.1 10*3/uL (ref 0.0–0.1)
Basophils Relative: 1 %
Eosinophils Absolute: 0.7 10*3/uL — ABNORMAL HIGH (ref 0.0–0.5)
Eosinophils Relative: 5 %
HCT: 29.6 % — ABNORMAL LOW (ref 36.0–46.0)
Hemoglobin: 9.3 g/dL — ABNORMAL LOW (ref 12.0–15.0)
Immature Granulocytes: 1 %
Lymphocytes Relative: 15 %
Lymphs Abs: 2.2 10*3/uL (ref 0.7–4.0)
MCH: 28.6 pg (ref 26.0–34.0)
MCHC: 31.4 g/dL (ref 30.0–36.0)
MCV: 91.1 fL (ref 80.0–100.0)
Monocytes Absolute: 1.6 10*3/uL — ABNORMAL HIGH (ref 0.1–1.0)
Monocytes Relative: 11 %
Neutro Abs: 9.6 10*3/uL — ABNORMAL HIGH (ref 1.7–7.7)
Neutrophils Relative %: 67 %
Platelets: 210 10*3/uL (ref 150–400)
RBC: 3.25 MIL/uL — ABNORMAL LOW (ref 3.87–5.11)
RDW: 15.8 % — ABNORMAL HIGH (ref 11.5–15.5)
WBC: 14.3 10*3/uL — ABNORMAL HIGH (ref 4.0–10.5)
nRBC: 0 % (ref 0.0–0.2)

## 2021-09-20 LAB — I-STAT VENOUS BLOOD GAS, ED
Acid-Base Excess: 6 mmol/L — ABNORMAL HIGH (ref 0.0–2.0)
Bicarbonate: 30.5 mmol/L — ABNORMAL HIGH (ref 20.0–28.0)
Calcium, Ion: 1.32 mmol/L (ref 1.15–1.40)
HCT: 29 % — ABNORMAL LOW (ref 36.0–46.0)
Hemoglobin: 9.9 g/dL — ABNORMAL LOW (ref 12.0–15.0)
O2 Saturation: 94 %
Potassium: 4.6 mmol/L (ref 3.5–5.1)
Sodium: 147 mmol/L — ABNORMAL HIGH (ref 135–145)
TCO2: 32 mmol/L (ref 22–32)
pCO2, Ven: 45.4 mmHg (ref 44.0–60.0)
pH, Ven: 7.435 — ABNORMAL HIGH (ref 7.250–7.430)
pO2, Ven: 70 mmHg — ABNORMAL HIGH (ref 32.0–45.0)

## 2021-09-20 LAB — URINALYSIS, ROUTINE W REFLEX MICROSCOPIC
Bilirubin Urine: NEGATIVE
Glucose, UA: NEGATIVE mg/dL
Ketones, ur: NEGATIVE mg/dL
Nitrite: POSITIVE — AB
Protein, ur: 30 mg/dL — AB
Specific Gravity, Urine: 1.01 (ref 1.005–1.030)
pH: 6 (ref 5.0–8.0)

## 2021-09-20 LAB — RESP PANEL BY RT-PCR (FLU A&B, COVID) ARPGX2
Influenza A by PCR: NEGATIVE
Influenza B by PCR: NEGATIVE
SARS Coronavirus 2 by RT PCR: POSITIVE — AB

## 2021-09-20 LAB — CBG MONITORING, ED: Glucose-Capillary: 122 mg/dL — ABNORMAL HIGH (ref 70–99)

## 2021-09-20 LAB — URINALYSIS, MICROSCOPIC (REFLEX): WBC, UA: >50 WBC/hpf (ref 0–5)

## 2021-09-20 LAB — APTT: aPTT: 35 seconds (ref 24–36)

## 2021-09-20 LAB — PROTIME-INR
INR: 1.1 (ref 0.8–1.2)
Prothrombin Time: 14.5 seconds (ref 11.4–15.2)

## 2021-09-20 MED ORDER — SODIUM CHLORIDE 0.9 % IV SOLN: INTRAVENOUS | Status: DC

## 2021-09-20 MED ORDER — CEPHALEXIN 250 MG/5ML PO SUSR: 250.0000 mg | Freq: Three times a day (TID) | ORAL | 0 refills | Status: AC

## 2021-09-20 MED ORDER — SODIUM CHLORIDE 0.9 % IV BOLUS
500.0000 mL | Freq: Once | INTRAVENOUS | Status: AC
  Administered 2021-09-20: 500 mL via INTRAVENOUS

## 2021-09-20 MED ORDER — CEFTRIAXONE SODIUM 1 G IJ SOLR
1.0000 g | Freq: Once | INTRAMUSCULAR | Status: AC
  Administered 2021-09-20: 1 g via INTRAVENOUS
  Filled 2021-09-20: qty 10

## 2021-09-20 NOTE — ED Notes (Signed)
[facility] called 13:18, eta 1.5 to 2 hours

## 2021-09-20 NOTE — Discharge Instructions (Signed)
Take the antibiotics as prescribed for possible urinary tract infection.  The COVID test was positive today fortunately you do not have any signs of pneumonia or need for oxygen.  Continue your current medications.  Follow-up with your doctor to be rechecked.

## 2021-09-20 NOTE — ED Notes (Signed)
Patient transported to X-ray 

## 2021-09-20 NOTE — ED Provider Notes (Signed)
[facility] EMERGENCY DEPARTMENT Provider Note   CSN: [id number] Arrival date & time: 09/20/21  0846     History  Chief Complaint  Patient presents with   Altered Mental Status    [name] is a 61 y.o. female.   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  Patient is now requiring full care and resides in a nursing facility.  Patient's mental baseline is usually with her being alert and having some garbled speech.  According to the EMS report the patient was brought to the ED for change in her mental status and a nosebleed that had resolved.  Patient was also recently being treated for pneumonia.  In the ED the patient does nod yes and no but unable to provide any other history.  She denies being in any pain.  She denies any nausea vomiting.  Her speech is otherwise garbled and unintelligible  Home Medications Prior to Admission medications   Medication Sig Start Date End Date Taking? Authorizing Provider  acetaminophen (TYLENOL) 325 MG tablet Take 2 tablets (650 mg total) by mouth every 4 (four) hours as needed for mild pain (or temp > 37.5 C (99.5 F)). 05/31/20   Angiulli, Lavon Paganini, PA-C  amantadine (SYMMETREL) 50 MG/5ML solution Take 10 mLs (100 mg total) by mouth 2 (two) times daily with breakfast and lunch. Patient taking differently: Place 100 mg into feeding tube 2 (two) times daily with breakfast and lunch. 05/31/20   Angiulli, Lavon Paganini, PA-C  Amino Acids-Protein Hydrolys (FEEDING SUPPLEMENT, PRO-STAT 64,) LIQD Place 30 mLs into feeding tube 3 (three) times daily with meals.    [provider]  amLODipine (NORVASC) 10 MG tablet Take 1 tablet (10 mg total) by mouth daily. Patient taking differently: Place 10 mg into feeding tube daily. 05/31/20   Angiulli, Lavon Paganini, PA-C  ascorbic acid (VITAMIN C) 500 MG tablet Place 500 mg into  feeding tube in the morning and at bedtime.    [provider]  baclofen (LIORESAL) 10 MG tablet Take 1 tablet (10 mg total) by mouth 2 (two) times daily. 05/31/20   Angiulli, Lavon Paganini, PA-C  carvedilol (COREG) 25 MG tablet Place 25 mg into feeding tube in the morning and at bedtime. 12/13/20   [provider]  clonazePAM (KLONOPIN) 0.5 MG tablet Take 0.5 tablets (0.25 mg total) by mouth 2 (two) times daily as needed for anxiety. 06/18/20 06/18/21  Raiford Noble Latif, DO  clopidogrel (PLAVIX) 75 MG tablet Take 1 tablet (75 mg total) by mouth daily. Patient taking differently: Place 75 mg into feeding tube daily. 06/19/20   Raiford Noble Latif, DO  ferrous sulfate 325 (65 FE) MG tablet Take 1 tablet (325 mg total) by mouth 2 (two) times daily with a meal. Patient taking differently: 325 mg 2 (two) times daily with a meal. Per tube 05/31/20   Angiulli, Lavon Paganini, PA-C  gabapentin (NEURONTIN) 100 MG capsule 100 mg in the morning and at bedtime. Per tube    [provider]  hydrALAZINE (APRESOLINE) 100 MG tablet Take 1 tablet (100 mg total) by mouth every 8 (eight) hours. Patient taking differently: Place 50 mg into feeding tube 3 (three) times daily. 05/31/20   Angiulli, Lavon Paganini, PA-C  isosorbide mononitrate (IMDUR) 60 MG 24 hr tablet Take 1 tablet (60 mg total) by mouth daily. Patient taking differently: 60 mg in the morning and  at bedtime. Per tube 06/02/20   Angiulli, Lavon Paganini, PA-C  levETIRAcetam (KEPPRA) 100 MG/ML solution Place 15 mLs into feeding tube in the morning and at bedtime.    [provider]  liver oil-zinc oxide (DESITIN) 40 % ointment Apply 1 application topically 3 (three) times daily. Apply to bilateral gluteal folds topically every shift for excoriation    [provider]  melatonin 3 MG TABS tablet Take 1 tablet (3 mg total) by mouth at bedtime. 05/31/20   Angiulli, Lavon Paganini, PA-C  metoprolol tartrate 75 MG TABS Take 75 mg by mouth 2 (two) times  daily. Patient not taking: Reported on 08/07/2021 06/18/20   Raiford Noble Latif, DO  Multiple Vitamin (MULTIVITAMIN WITH MINERALS) TABS tablet Take 1 tablet by mouth daily. Patient taking differently: Place 1 tablet into feeding tube daily. 05/04/20   Mariel Aloe, MD  omeprazole (PRILOSEC) 40 MG capsule Take 1 capsule (40 mg total) by mouth 2 (two) times daily for 60 doses. Open up 1 capsule & sprinkle on food twice daily 05/31/20 06/30/20  Jamse Arn, MD  ondansetron (ZOFRAN) 4 MG tablet Place 4 mg into feeding tube every 6 (six) hours as needed.    [provider]  pantoprazole sodium (PROTONIX) 40 mg Place 40 mg into feeding tube daily.    [provider]  PARoxetine (PAXIL) 10 MG tablet Place 10 mg into feeding tube daily.    [provider]  Petrolatum-Zinc Oxide (PHYTOPLEX Z-GUARD) 57-17 % PSTE Apply 1 application topically in the morning, at noon, and at bedtime. Apply to bilateral buttocks topically every shift for moisture barrier cream    [provider]  phenytoin (DILANTIN) 125 MG/5ML suspension Place 4 mLs into feeding tube 3 (three) times daily.    [provider]  polyethylene glycol (MIRALAX / GLYCOLAX) 17 g packet Take 17 g by mouth 2 (two) times daily. 05/31/20   Angiulli, Lavon Paganini, PA-C  sucralfate (CARAFATE) 1 g tablet Take 1 tablet (1 g total) by mouth 4 (four) times daily. Mix 1 tablet with 10-22mls of water to make a slurry and take by mouth four times daily with meals and at bedtime Patient taking differently: Place 1 g into feeding tube 4 (four) times daily. Mix 1 tablet with 10-77mls of water to make a slurry and take by mouth four times daily with meals and at bedtime 05/31/20 08/07/21  Jamse Arn, MD  traMADol (ULTRAM) 50 MG tablet Take 1 tablet (50 mg total) by mouth every 6 (six) hours as needed for moderate pain. 08/09/21   Darliss Cheney, MD      Allergies    Patient has no known allergies.    Review of  Systems   Review of Systems  Physical Exam Updated Vital Signs BP (!) 147/90    Pulse 74    Temp 98 F (36.7 C) (Axillary)    Resp 19    LMP 09/15/2013    SpO2 99%  Physical Exam Vitals and nursing note reviewed.  Constitutional:      Appearance: She is well-developed. She is not diaphoretic.  HENT:     Head: Normocephalic and atraumatic.     Right Ear: External ear normal.     Left Ear: External ear normal.     Mouth/Throat:     Mouth: Mucous membranes are dry.  Eyes:     General: No scleral icterus.       Right eye: No discharge.  Left eye: No discharge.     Conjunctiva/sclera: Conjunctivae normal.  Neck:     Trachea: No tracheal deviation.  Cardiovascular:     Rate and Rhythm: Normal rate and regular rhythm.  Pulmonary:     Effort: Pulmonary effort is normal. No respiratory distress.     Breath sounds: Normal breath sounds. No stridor. No wheezing or rales.     Comments: Frequent coughing Abdominal:     General: Bowel sounds are normal. There is no distension.     Palpations: Abdomen is soft.     Tenderness: There is no abdominal tenderness. There is no guarding or rebound.  Musculoskeletal:        General: No tenderness or deformity.     Cervical back: Neck supple.     Comments: Padded boots on feet  Skin:    General: Skin is warm and dry.     Findings: No rash.  Neurological:     Mental Status: She is alert.     GCS: GCS eye subscore is 4. GCS verbal subscore is 2. GCS motor subscore is 6.     Cranial Nerves: Dysarthria present.     Motor: Weakness and atrophy present. No abnormal muscle tone or seizure activity.     Comments: Speech unintelligible, patient does follow some commands and moves her right arm when asked to.  Limited movement of both lower extremities, eyes are open and patient looks at me when I speak to her  Psychiatric:        Mood and Affect: Mood normal.    ED Results / Procedures / Treatments   Labs (all labs ordered are listed, but  only abnormal results are displayed) Labs Reviewed - No data to display  EKG EKG Interpretation  Date/Time:  Wednesday September 20 2021 08:54:12 EST Ventricular Rate:  74 PR Interval:  138 QRS Duration: 96 QT Interval:  426 QTC Calculation: 473 R Axis:   8 Text Interpretation: Sinus or ectopic atrial rhythm LVH with secondary repolarization abnormality Anterior Q waves, possibly due to LVH No significant change since last tracing Confirmed by Dorie Rank [phone]) on 09/20/2021 8:57:22 AM  Radiology No results found.  Procedures .1-3 Lead EKG Interpretation Performed by: Dorie Rank, MD Authorized by: Dorie Rank, MD     Interpretation: normal     ECG rate:  72   ECG rate assessment: normal     Rhythm: sinus rhythm     Ectopy: none     Conduction: normal      Medications Ordered in ED Medications  sodium chloride 0.9 % bolus 500 mL (has no administration in time range)    And  0.9 %  sodium chloride infusion (has no administration in time range)    ED Course/ Medical Decision Making/ A&P Clinical Course as of 09/20/21 1221  Wed Sep 20, 2021  1000 Venous blood gas without signs of acidosis or hypercarbia [JK]  4259 Metabolic panel shows hypoproteinemia and elevated BUN.  Elevated BUN was noted on previous values. [JK]  1001 Head CT report reviewed no acute findings [JK]  1001 Chest x-ray images and radiology report reviewed.  No acute pneumonia noted [JK]  1140 Urinalysis is from an indwelling catheter.  Patient does have many bacteria and white blood cell clumps.  Nitrite is positive large leukocyte esterase.  With her difficulty speaking is hard to say if she is having any symptoms. [JK]  1140 COVID test is positive.  Would account for her cough and URI  symptoms.  Unclear when her symptoms have started. [HF]  0263 ZCHYIFOYD with the Daughter.  Daughter was diagnosed with covid last Wednesday.  Pt had a negative test per daughter recently.  Pt has been vaccinated.  Sx ongoing  for at least 5 days [JK]    Clinical Course User Index [JK] Dorie Rank, MD                           Patient was sent to the ED for evaluation of altered mental status.  On arrival here in the ED however the patient appeared to be at her baseline.  She was nodding yes and no.  She is alert and responding appropriately during my conversation with her.  Patient recently was diagnosed with pneumonia and was started on antibiotics.  I was able to to obtain additional information from the patient's daughter.  The daughter recently was diagnosed with covid.  She did mention this to the facility and was told that the patient had tested negative.  Patient symptoms have been ongoing for a week.  She does have covid here and this would account for her cough and URI symptoms.  Fortunately she does not have an oxygen requirement.  There is no pneumonia.  No indications for hospitalization.  Also no indication for antiviral treatment as she appears to be outside the 5-day window.  Urinalysis is abnormal however the patient does have any ongoing catheter.  Difficult to determine if she is having any urinary symptoms with her aphasia.  This very well might be colonization but with her leukocytosis and the initial complaints of altered mental status I will treat her with a course of antibiotics and send off urine culture.  Patient previously had issues with hyponatremia but no signs of that at this time.  Her exam is otherwise reassuring and she appears stable for discharge back to the nursing facility.        Final Clinical Impression(s) / ED Diagnoses Final diagnoses:  None    Rx / DC Orders ED Discharge Orders     None         Dorie Rank, MD 09/20/21 1224

## 2021-09-20 NOTE — ED Notes (Signed)
Pt verbalizes understanding of discharge instructions. Opportunity for questions and answers were provided. Pt discharged from the ED to [facility] via [facility].

## 2021-09-20 NOTE — ED Triage Notes (Signed)
Pt BIB [facility] from [facility]. Staff noticed this AM that pt appeared to have a nosebleed, had dried blood from L nostril. Pt had PnA recently and finished abx. Staff also reports change in mental status. Pt normally A/O x2, staff reports pt is now A/O x0, EMS reports pt is at baseline.   EMS VS CBG 152, HR 74, 99% RA, BP 144/84

## 2021-09-23 LAB — URINE CULTURE: Culture: >=100000 — AB

## 2021-09-24 ENCOUNTER — Telehealth: Payer: Self-pay | Admitting: Emergency Medicine

## 2021-09-24 NOTE — Telephone Encounter (Signed)
Post ED Visit - Positive Culture Follow-up: Successful Patient Follow-Up  Culture assessed and recommendations reviewed by:  []  Elenor Quinones, Pharm.D. []  Heide Guile, Pharm.D., BCPS AQ-ID []  Parks Neptune, Pharm.D., BCPS []  Alycia Rossetti, Pharm.D., BCPS []  [place], Pharm.D., BCPS, AAHIVP []  Legrand Como, Pharm.D., BCPS, AAHIVP []  Salome Arnt, PharmD, BCPS []  Johnnette Gourd, PharmD, BCPS []  Hughes Better, PharmD, BCPS [x]  Lorelei Pont, PharmD  Positive urine culture  []  Patient discharged without antimicrobial prescription and treatment is now indicated [x]  Organism is resistant to prescribed ED discharge antimicrobial []  Patient with positive blood cultures  Changes discussed with ED provider: Wynona Dove MD New antibiotic prescription Macrobid 100 mg BID for five days Called/faxed to [facility] Ph: [phone] Fax: [phone]  Contacted patient's SNF, date 09/24/21, time [place] 09/24/2021, 4:14 PM

## 2021-09-24 NOTE — Progress Notes (Signed)
ED Antimicrobial Stewardship Positive Culture Follow Up   [name] is an 61 y.o. female who presented to [facility] on 09/20/2021 with a chief complaint of  Chief Complaint  Patient presents with   Altered Mental Status    Recent Results (from the past 720 hour(s))  Resp Panel by RT-PCR (Flu A&B, Covid) Nasopharyngeal Swab     Status: Abnormal   Collection Time: 09/20/21 10:06 AM   Specimen: Nasopharyngeal Swab; Nasopharyngeal(NP) swabs in vial transport medium  Result Value Ref Range Status   SARS Coronavirus 2 by RT PCR POSITIVE (A) NEGATIVE Final    Comment: (NOTE) SARS-CoV-2 target nucleic acids are DETECTED.  The SARS-CoV-2 RNA is generally detectable in upper respiratory specimens during the acute phase of infection. Positive results are indicative of the presence of the identified virus, but do not rule out bacterial infection or co-infection with other pathogens not detected by the test. Clinical correlation with patient history and other diagnostic information is necessary to determine patient infection status. The expected result is Negative.  Fact Sheet for Patients: EntrepreneurPulse.com.au  Fact Sheet for Healthcare Providers: IncredibleEmployment.be  This test is not yet approved or cleared by the [place] FDA and  has been authorized for detection and/or diagnosis of SARS-CoV-2 by FDA under an Emergency Use Authorization (EUA).  This EUA will remain in effect (meaning this test can be used) for the duration of  the COVID-19 declaration under Section 564(b)(1) of the A ct, 21 U.S.C. section 360bbb-3(b)(1), unless the authorization is terminated or revoked sooner.     Influenza A by PCR NEGATIVE NEGATIVE Final   Influenza B by PCR NEGATIVE NEGATIVE Final    Comment: (NOTE) The Xpert Xpress SARS-CoV-2/FLU/RSV plus assay is intended as an aid in the diagnosis of influenza from Nasopharyngeal swab  specimens and should not be used as a sole basis for treatment. Nasal washings and aspirates are unacceptable for Xpert Xpress SARS-CoV-2/FLU/RSV testing.  Fact Sheet for Patients: EntrepreneurPulse.com.au  Fact Sheet for Healthcare Providers: IncredibleEmployment.be  This test is not yet approved or cleared by the [place] FDA and has been authorized for detection and/or diagnosis of SARS-CoV-2 by FDA under an Emergency Use Authorization (EUA). This EUA will remain in effect (meaning this test can be used) for the duration of the COVID-19 declaration under Section 564(b)(1) of the Act, 21 U.S.C. section 360bbb-3(b)(1), unless the authorization is terminated or revoked.  Performed at [facility], [place] [address], [place], [place] [address]   Urine Culture     Status: Abnormal   Collection Time: 09/20/21 10:59 AM   Specimen: Urine, Catheterized  Result Value Ref Range Status   Specimen Description URINE, CATHETERIZED  Final   Special Requests   Final    NONE Performed at [facility], [place] [address] [place], [place] [address]    Culture (A)  Final    >=100,000 COLONIES/mL ESCHERICHIA COLI Confirmed Extended Spectrum Beta-Lactamase Producer (ESBL).  In bloodstream infections from ESBL organisms, carbapenems are preferred over piperacillin/tazobactam. They are shown to have a lower risk of mortality. >=100,000 COLONIES/mL ENTEROCOCCUS FAECALIS    Report Status 09/23/2021 FINAL  Final   Organism ID, Bacteria ESCHERICHIA COLI (A)  Final   Organism ID, Bacteria ENTEROCOCCUS FAECALIS (A)  Final      Susceptibility   Escherichia coli - MIC*    AMPICILLIN >=32 RESISTANT Resistant     CEFAZOLIN >=64 RESISTANT Resistant     CEFEPIME 16 RESISTANT Resistant  CEFTRIAXONE >=64 RESISTANT Resistant     CIPROFLOXACIN >=4 RESISTANT Resistant     GENTAMICIN >=16 RESISTANT Resistant     IMIPENEM <=0.25 SENSITIVE Sensitive      NITROFURANTOIN <=16 SENSITIVE Sensitive     TRIMETH/SULFA >=320 RESISTANT Resistant     AMPICILLIN/SULBACTAM >=32 RESISTANT Resistant     PIP/TAZO <=4 SENSITIVE Sensitive     * >=100,000 COLONIES/mL ESCHERICHIA COLI   Enterococcus faecalis - MIC*    AMPICILLIN <=2 SENSITIVE Sensitive     NITROFURANTOIN <=16 SENSITIVE Sensitive     VANCOMYCIN 1 SENSITIVE Sensitive     * >=100,000 COLONIES/mL ENTEROCOCCUS FAECALIS    [x]  Treated with keflex, organism resistant to prescribed antimicrobial  New antibiotic prescription: Nitrofurantoin (Macrobid) 100 mg BID x 5 days (Qty 10; Refills 0)  ED Provider: Wynona Dove, DO   Lorelei Pont, PharmD, BCPS 09/24/2021 10:52 AM ED Clinical Pharmacist -  [phone]

## 2021-10-11 DEATH — deceased
